# Patient Record
Sex: Male | Born: 1985
Health system: Southern US, Community
[De-identification: ages and names within clinical notes are randomized; demographics above are authoritative.]

## PROBLEM LIST (undated history)

## (undated) DIAGNOSIS — Z8669 Personal history of other diseases of the nervous system and sense organs: Secondary | ICD-10-CM

## (undated) DIAGNOSIS — F419 Anxiety disorder, unspecified: Secondary | ICD-10-CM

## (undated) DIAGNOSIS — G894 Chronic pain syndrome: Secondary | ICD-10-CM

## (undated) DIAGNOSIS — G2581 Restless legs syndrome: Secondary | ICD-10-CM

## (undated) DIAGNOSIS — F319 Bipolar disorder, unspecified: Secondary | ICD-10-CM

## (undated) DIAGNOSIS — Z87442 Personal history of urinary calculi: Secondary | ICD-10-CM

## (undated) DIAGNOSIS — F32A Depression, unspecified: Secondary | ICD-10-CM

## (undated) DIAGNOSIS — E785 Hyperlipidemia, unspecified: Secondary | ICD-10-CM

## (undated) DIAGNOSIS — J189 Pneumonia, unspecified organism: Secondary | ICD-10-CM

## (undated) DIAGNOSIS — F329 Major depressive disorder, single episode, unspecified: Secondary | ICD-10-CM

## (undated) HISTORY — DX: Hyperlipidemia, unspecified: E78.5

## (undated) HISTORY — DX: Restless legs syndrome: G25.81

## (undated) HISTORY — DX: Anxiety disorder, unspecified: F41.9

## (undated) HISTORY — PX: TESTICLE SURGERY: SHX794

## (undated) HISTORY — DX: Major depressive disorder, single episode, unspecified: F32.9

## (undated) HISTORY — DX: Depression, unspecified: F32.A

## (undated) HISTORY — PX: KNEE SURGERY: SHX244

---
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
## 2004-10-21 ENCOUNTER — Emergency Department: Payer: Self-pay | Admitting: Emergency Medicine

## 2004-10-23 ENCOUNTER — Emergency Department: Payer: Self-pay | Admitting: Emergency Medicine

## 2005-01-21 ENCOUNTER — Emergency Department: Payer: Self-pay | Admitting: Internal Medicine

## 2005-07-14 ENCOUNTER — Inpatient Hospital Stay: Payer: Self-pay | Admitting: Psychiatry

## 2005-11-13 ENCOUNTER — Emergency Department: Payer: Self-pay | Admitting: Internal Medicine

## 2005-11-30 ENCOUNTER — Emergency Department: Payer: Self-pay | Admitting: Emergency Medicine

## 2006-09-15 ENCOUNTER — Emergency Department: Payer: Self-pay | Admitting: General Practice

## 2007-07-04 ENCOUNTER — Emergency Department: Payer: Self-pay | Admitting: Emergency Medicine

## 2007-10-21 ENCOUNTER — Other Ambulatory Visit: Payer: Self-pay

## 2007-10-21 ENCOUNTER — Emergency Department: Payer: Self-pay | Admitting: Emergency Medicine

## 2009-09-19 ENCOUNTER — Emergency Department: Payer: Self-pay | Admitting: Emergency Medicine

## 2011-05-05 ENCOUNTER — Emergency Department: Payer: Self-pay | Admitting: Emergency Medicine

## 2011-05-18 ENCOUNTER — Ambulatory Visit: Payer: Self-pay | Admitting: Orthopedic Surgery

## 2013-12-10 ENCOUNTER — Emergency Department: Payer: Self-pay | Admitting: Emergency Medicine

## 2013-12-10 LAB — CBC WITH DIFFERENTIAL/PLATELET
Basophil #: 0 10*3/uL (ref 0.0–0.1)
Basophil %: 0.3 %
EOS PCT: 0.8 %
Eosinophil #: 0.1 10*3/uL (ref 0.0–0.7)
HCT: 45.9 % (ref 40.0–52.0)
HGB: 15.5 g/dL (ref 13.0–18.0)
LYMPHS ABS: 2.9 10*3/uL (ref 1.0–3.6)
Lymphocyte %: 24.4 %
MCH: 30 pg (ref 26.0–34.0)
MCHC: 33.8 g/dL (ref 32.0–36.0)
MCV: 89 fL (ref 80–100)
Monocyte #: 0.6 x10 3/mm (ref 0.2–1.0)
Monocyte %: 5.1 %
NEUTROS PCT: 69.4 %
Neutrophil #: 8.1 10*3/uL — ABNORMAL HIGH (ref 1.4–6.5)
Platelet: 193 10*3/uL (ref 150–440)
RBC: 5.17 10*6/uL (ref 4.40–5.90)
RDW: 13.1 % (ref 11.5–14.5)
WBC: 11.7 10*3/uL — AB (ref 3.8–10.6)

## 2013-12-10 LAB — URINALYSIS, COMPLETE
BILIRUBIN, UR: NEGATIVE
Bacteria: NONE SEEN
Blood: NEGATIVE
Glucose,UR: NEGATIVE mg/dL (ref 0–75)
Ketone: NEGATIVE
Leukocyte Esterase: NEGATIVE
Nitrite: NEGATIVE
PROTEIN: NEGATIVE
Ph: 7 (ref 4.5–8.0)
RBC,UR: NONE SEEN /HPF (ref 0–5)
Specific Gravity: 1.001 (ref 1.003–1.030)
Squamous Epithelial: NONE SEEN
WBC UR: NONE SEEN /HPF (ref 0–5)

## 2013-12-10 LAB — COMPREHENSIVE METABOLIC PANEL
ALT: 23 U/L
Albumin: 4.3 g/dL (ref 3.4–5.0)
Alkaline Phosphatase: 79 U/L
Anion Gap: 9 (ref 7–16)
BUN: 7 mg/dL (ref 7–18)
Bilirubin,Total: 0.3 mg/dL (ref 0.2–1.0)
CHLORIDE: 107 mmol/L (ref 98–107)
Calcium, Total: 8.8 mg/dL (ref 8.5–10.1)
Co2: 25 mmol/L (ref 21–32)
Creatinine: 0.74 mg/dL (ref 0.60–1.30)
EGFR (African American): >60
EGFR (Non-African Amer.): >60
GLUCOSE: 92 mg/dL (ref 65–99)
OSMOLALITY: 279 (ref 275–301)
Potassium: 3.5 mmol/L (ref 3.5–5.1)
SGOT(AST): 26 U/L (ref 15–37)
Sodium: 141 mmol/L (ref 136–145)
TOTAL PROTEIN: 7.2 g/dL (ref 6.4–8.2)

## 2013-12-10 LAB — LIPASE, BLOOD: Lipase: 73 U/L (ref 73–393)

## 2014-03-12 ENCOUNTER — Emergency Department: Payer: Self-pay | Admitting: Emergency Medicine

## 2014-03-12 LAB — CBC
HCT: 45.1 % (ref 40.0–52.0)
HGB: 15.1 g/dL (ref 13.0–18.0)
MCH: 30.2 pg (ref 26.0–34.0)
MCHC: 33.4 g/dL (ref 32.0–36.0)
MCV: 90 fL (ref 80–100)
Platelet: 226 10*3/uL (ref 150–440)
RBC: 4.99 10*6/uL (ref 4.40–5.90)
RDW: 13.3 % (ref 11.5–14.5)
WBC: 9.4 10*3/uL (ref 3.8–10.6)

## 2014-03-13 LAB — BASIC METABOLIC PANEL
ANION GAP: 7 (ref 7–16)
BUN: 2 mg/dL — ABNORMAL LOW (ref 7–18)
CALCIUM: 8.6 mg/dL (ref 8.5–10.1)
Chloride: 109 mmol/L — ABNORMAL HIGH (ref 98–107)
Co2: 25 mmol/L (ref 21–32)
Creatinine: 0.64 mg/dL (ref 0.60–1.30)
EGFR (African American): >60
EGFR (Non-African Amer.): >60
Glucose: 86 mg/dL (ref 65–99)
Osmolality: 277 (ref 275–301)
POTASSIUM: 3.5 mmol/L (ref 3.5–5.1)
Sodium: 141 mmol/L (ref 136–145)

## 2014-03-13 LAB — TROPONIN I

## 2014-07-15 ENCOUNTER — Emergency Department
Admit: 2014-07-15 | Disposition: A | Discharge status: Home / Self Care | Payer: Self-pay | Admitting: Emergency Medicine

## 2014-10-09 ENCOUNTER — Encounter: Payer: Self-pay | Admitting: Emergency Medicine

## 2014-10-09 DIAGNOSIS — Y9289 Other specified places as the place of occurrence of the external cause: Secondary | ICD-10-CM | POA: Insufficient documentation

## 2014-10-09 DIAGNOSIS — S8991XA Unspecified injury of right lower leg, initial encounter: Secondary | ICD-10-CM | POA: Diagnosis not present

## 2014-10-09 DIAGNOSIS — Y9389 Activity, other specified: Secondary | ICD-10-CM | POA: Insufficient documentation

## 2014-10-09 DIAGNOSIS — X58XXXA Exposure to other specified factors, initial encounter: Secondary | ICD-10-CM | POA: Diagnosis not present

## 2014-10-09 DIAGNOSIS — Y990 Civilian activity done for income or pay: Secondary | ICD-10-CM | POA: Insufficient documentation

## 2014-10-09 MED ORDER — KETOROLAC TROMETHAMINE 10 MG PO TABS
10.0000 mg | ORAL_TABLET | Freq: Once | ORAL | Status: AC
  Administered 2014-10-09: 10 mg via ORAL
  Filled 2014-10-09: qty 6

## 2014-10-09 MED ORDER — OXYCODONE-ACETAMINOPHEN 5-325 MG PO TABS
1.0000 | ORAL_TABLET | Freq: Once | ORAL | Status: AC
  Administered 2014-10-09: 1 via ORAL
  Filled 2014-10-09: qty 1

## 2014-10-09 MED ORDER — KETOROLAC TROMETHAMINE 10 MG PO TABS: 10.0000 mg | ORAL_TABLET | Freq: Three times a day (TID) | ORAL | Status: DC | PRN

## 2014-10-09 NOTE — ED Provider Notes (Signed)
[facility] Emergency Department Provider Note  ____________________________________________  Time seen: 11:35 PM  I have reviewed the triage vital signs and the nursing notes.   HISTORY  Chief Complaint Knee Pain      HPI Joel Carr is a 29 y.o. male presents with "my right knee just gave out". Patient states while at work tonight he had acute onset of right knee pain is currently 7 out of 10 and that his knee just gave out. Patient denies any injury tonight states that he's had a previous injury to the right knee for which he is followed by Dr. Rosita KeaMenz. She has an appointment Dr. Rosita KeaMenz on Monday     Past Medical History  Diagnosis Date  . Bipolar disorder     There are no active problems to display for this patient.   History reviewed. No pertinent past surgical history.  No current outpatient prescriptions on file.  Allergies Review of patient's allergies indicates no known allergies.  No family history on file.  Social History History  Substance Use Topics  . Smoking status: Never Smoker   . Smokeless tobacco: Never Used  . Alcohol Use: Yes    Review of Systems  Constitutional: Negative for fever. Eyes: Negative for visual changes. ENT: Negative for sore throat. Cardiovascular: Negative for chest pain. Respiratory: Negative for shortness of breath. Gastrointestinal: Negative for abdominal pain, vomiting and diarrhea. Genitourinary: Negative for dysuria. Musculoskeletal: Negative for back pain.Positive for right knee pain  Skin: Negative for rash. Neurological: Negative for headaches, focal weakness or numbness.   10-point ROS otherwise negative.  ____________________________________________   PHYSICAL EXAM:  VITAL SIGNS: ED Triage Vitals  Enc Vitals Group     BP 10/09/14 2057 124/77 mmHg     Pulse Rate 10/09/14 2057 72     Resp 10/09/14 2057 18     Temp 10/09/14 2057 98.5 F (36.9 C)     Temp Source 10/09/14 2057  Oral     SpO2 10/09/14 2057 99 %     Weight 10/09/14 2057 160 lb (72.576 kg)     Height 10/09/14 2057 5\' 8"  (1.727 m)     Head Cir --      Peak Flow --      Pain Score 10/09/14 2058 9     Pain Loc --      Pain Edu? --      Excl. in GC? --      Constitutional: Alert and oriented. Well appearing and in no distress. Musculoskeletal: Nontender with normal range of motion in all extremities. No joint effusions.  pain with palpation of the anterior right knee pain with anterior and posterior draw tests  Neurologic:  Normal speech and language. No gross focal neurologic deficits are appreciated. Speech is normal.  Skin:  Skin is warm, dry and intact. No rash noted. Psychiatric: Mood and affect are normal. Speech and behavior are normal. Patient exhibits appropriate insight and judgment.     INITIAL IMPRESSION / ASSESSMENT AND PLAN / ED COURSE  Pertinent labs & imaging results that were available during my care of the patient were reviewed by me and considered in my medical decision making (see chart for details).  History of physical exam consistent with possible ligamentous injury of the right knee. In absence of trauma x-ray of the knee was not performed. Patient will be referred to Dr. Trilby DrummerManns for possible outpatient MRI.  ____________________________________________   FINAL CLINICAL IMPRESSION(S) / ED DIAGNOSES  Final diagnoses:  Knee pain, right anterior      Darci Current, MD 10/11/14 [phone]

## 2014-10-09 NOTE — ED Notes (Signed)
Pt presents to ED with right knee pain. Pt states he has a previous injury which is being followed by Dr. Rosita KeaMenz since April due to a fall at work. Pt states tonight while at work pt was walking and he felt his right knee "give out" and has been hurting ever since then. Pt denies any new injury or fall. Limping in triage. No obvious deformity.

## 2014-10-09 NOTE — Discharge Instructions (Signed)

## 2014-10-10 ENCOUNTER — Emergency Department
Admission: EM | Admit: 2014-10-10 | Discharge: 2014-10-10 | Disposition: A | Discharge status: Home / Self Care | Payer: BLUE CROSS/BLUE SHIELD | Attending: Emergency Medicine | Admitting: Emergency Medicine

## 2014-10-10 DIAGNOSIS — M25561 Pain in right knee: Secondary | ICD-10-CM

## 2014-10-10 HISTORY — DX: Bipolar disorder, unspecified: F31.9

## 2014-10-10 NOTE — ED Notes (Signed)
Patient reporting that he has crutches and knee immobilizer at home. MD advised patient to utilize devices until f/u with Rosita KeaMenz, MD

## 2014-10-10 NOTE — ED Notes (Signed)

## 2014-10-29 ENCOUNTER — Ambulatory Visit
Admission: RE | Admit: 2014-10-29 | Discharge: 2014-10-29 | Disposition: A | Discharge status: Home / Self Care | Payer: BLUE CROSS/BLUE SHIELD | Source: Ambulatory Visit | Admitting: Orthopedic Surgery

## 2014-10-29 ENCOUNTER — Ambulatory Visit: Payer: BLUE CROSS/BLUE SHIELD | Admitting: Certified Registered Nurse Anesthetist

## 2014-10-29 ENCOUNTER — Encounter
Admission: RE | Disposition: A | Discharge status: Home / Self Care | Payer: Self-pay | Source: Ambulatory Visit | Attending: Orthopedic Surgery

## 2014-10-29 ENCOUNTER — Encounter: Payer: Self-pay | Admitting: *Deleted

## 2014-10-29 ENCOUNTER — Ambulatory Visit
Admission: RE | Admit: 2014-10-29 | Discharge: 2014-10-29 | Disposition: A | Discharge status: Home / Self Care | Payer: BLUE CROSS/BLUE SHIELD | Source: Ambulatory Visit | Attending: Orthopedic Surgery | Admitting: Orthopedic Surgery

## 2014-10-29 DIAGNOSIS — Z79899 Other long term (current) drug therapy: Secondary | ICD-10-CM | POA: Insufficient documentation

## 2014-10-29 DIAGNOSIS — F319 Bipolar disorder, unspecified: Secondary | ICD-10-CM | POA: Insufficient documentation

## 2014-10-29 DIAGNOSIS — M25861 Other specified joint disorders, right knee: Secondary | ICD-10-CM | POA: Diagnosis not present

## 2014-10-29 HISTORY — PX: KNEE ARTHROSCOPY WITH MENISCAL REPAIR: SHX5653

## 2014-10-29 SURGERY — ARTHROSCOPY, KNEE, WITH MENISCUS REPAIR
Anesthesia: General | Site: Knee | Laterality: Right | Wound class: Clean

## 2014-10-29 MED ORDER — LACTATED RINGERS IV SOLN
INTRAVENOUS | Status: DC
  Administered 2014-10-29: 100 mL/h via INTRAVENOUS

## 2014-10-29 MED ORDER — MIDAZOLAM HCL 2 MG/2ML IJ SOLN
INTRAMUSCULAR | Status: DC | PRN
  Administered 2014-10-29: 2 mg via INTRAVENOUS

## 2014-10-29 MED ORDER — LIDOCAINE HCL (CARDIAC) 20 MG/ML IV SOLN
INTRAVENOUS | Status: DC | PRN
  Administered 2014-10-29: 100 mg via INTRAVENOUS

## 2014-10-29 MED ORDER — ONDANSETRON HCL 4 MG/2ML IJ SOLN: 4.0000 mg | Freq: Once | INTRAMUSCULAR | Status: DC | PRN

## 2014-10-29 MED ORDER — FENTANYL CITRATE (PF) 100 MCG/2ML IJ SOLN: 25.0000 ug | INTRAMUSCULAR | Status: DC | PRN

## 2014-10-29 MED ORDER — FENTANYL CITRATE (PF) 100 MCG/2ML IJ SOLN
INTRAMUSCULAR | Status: DC | PRN
  Administered 2014-10-29 (×4): 25 ug via INTRAVENOUS

## 2014-10-29 MED ORDER — PROPOFOL 10 MG/ML IV BOLUS
INTRAVENOUS | Status: DC | PRN
  Administered 2014-10-29: 200 mg via INTRAVENOUS

## 2014-10-29 MED ORDER — BUPIVACAINE-EPINEPHRINE (PF) 0.5% -1:200000 IJ SOLN
INTRAMUSCULAR | Status: AC
  Filled 2014-10-29: qty 30

## 2014-10-29 MED ORDER — ACETAMINOPHEN 10 MG/ML IV SOLN
INTRAVENOUS | Status: AC
  Filled 2014-10-29: qty 100

## 2014-10-29 MED ORDER — ONDANSETRON HCL 4 MG/2ML IJ SOLN
INTRAMUSCULAR | Status: DC | PRN
  Administered 2014-10-29: 4 mg via INTRAVENOUS

## 2014-10-29 MED ORDER — BUPIVACAINE-EPINEPHRINE (PF) 0.5% -1:200000 IJ SOLN
INTRAMUSCULAR | Status: DC | PRN
  Administered 2014-10-29: 30 mL via PERINEURAL

## 2014-10-29 MED ORDER — HYDROCODONE-ACETAMINOPHEN 5-325 MG PO TABS: 1.0000 | ORAL_TABLET | Freq: Four times a day (QID) | ORAL | Status: DC | PRN

## 2014-10-29 MED ORDER — ACETAMINOPHEN 10 MG/ML IV SOLN
INTRAVENOUS | Status: DC | PRN
  Administered 2014-10-29: 1000 mg via INTRAVENOUS

## 2014-10-29 SURGICAL SUPPLY — 28 items
BANDAGE ELASTIC 4 CLIP NS LF (GAUZE/BANDAGES/DRESSINGS) ×2 IMPLANT
BANDAGE ELASTIC 4 CLIP ST LF (GAUZE/BANDAGES/DRESSINGS) ×2 IMPLANT
BLADE FULL RADIUS 3.5 (BLADE) IMPLANT
BLADE INCISOR PLUS 4.5 (BLADE) ×2 IMPLANT
BLADE SHAVER 4.5 DBL SERAT CV (CUTTER) IMPLANT
BLADE SHAVER 4.5X7 STR FR (MISCELLANEOUS) ×2 IMPLANT
CHLORAPREP W/TINT 26ML (MISCELLANEOUS) ×2 IMPLANT
CUTTER AGGRESSIVE+ 3.5 (CUTTER) IMPLANT
GAUZE PETRO XEROFOAM 1X8 (MISCELLANEOUS) ×2 IMPLANT
GAUZE SPONGE 4X4 12PLY STRL (GAUZE/BANDAGES/DRESSINGS) ×2 IMPLANT
GLOVE BIOGEL PI IND STRL 9 (GLOVE) ×2 IMPLANT
GLOVE BIOGEL PI INDICATOR 9 (GLOVE) ×2
GLOVE SURG ORTHO 9.0 STRL STRW (GLOVE) ×4 IMPLANT
GOWN SPECIALTY ULTRA XL (MISCELLANEOUS) ×2 IMPLANT
GOWN STRL REUS W/ TWL LRG LVL3 (GOWN DISPOSABLE) ×2 IMPLANT
GOWN STRL REUS W/TWL LRG LVL3 (GOWN DISPOSABLE) ×2
IV LACTATED RINGER IRRG 3000ML (IV SOLUTION) ×2
IV LR IRRIG 3000ML ARTHROMATIC (IV SOLUTION) ×2 IMPLANT
KIT RM TURNOVER STRD PROC AR (KITS) ×2 IMPLANT
MANIFOLD NEPTUNE II (INSTRUMENTS) ×2 IMPLANT
PACK ARTHROSCOPY KNEE (MISCELLANEOUS) ×2 IMPLANT
SET TUBE SUCT SHAVER OUTFL 24K (TUBING) ×2 IMPLANT
SET TUBE TIP INTRA-ARTICULAR (MISCELLANEOUS) ×2 IMPLANT
SUT ETHILON 4-0 (SUTURE) ×1
SUT ETHILON 4-0 FS2 18XMFL BLK (SUTURE) ×1
SUTURE ETHLN 4-0 FS2 18XMF BLK (SUTURE) ×1 IMPLANT
TUBING ARTHRO INFLOW-ONLY STRL (TUBING) ×2 IMPLANT
WAND HAND CNTRL MULTIVAC 50 (MISCELLANEOUS) ×2 IMPLANT

## 2014-10-29 NOTE — Transfer of Care (Signed)
Immediate Anesthesia Transfer of Care Note  Patient: Joel Carr  Procedure(s) Performed: Procedure(s): KNEE ARTHROSCOPY WITH MENISCAL REPAIR (Right)  Patient Location: PACU  Anesthesia Type:General  Level of Consciousness: sedated  Airway & Oxygen Therapy: Patient Spontanous Breathing and Patient connected to face mask oxygen  Post-op Assessment: Report given to RN and Post -op Vital signs reviewed and stable  Post vital signs: Reviewed and stable  Last Vitals:  Filed Vitals:   10/29/14 1358  BP: 120/75  Pulse: 73  Temp: 36.7 C  Resp: 13    Complications: No apparent anesthesia complications

## 2014-10-29 NOTE — Op Note (Signed)
10/29/2014  2:04 PM  PATIENT:  Joel Carr  29 y.o. male  PRE-OPERATIVE DIAGNOSIS:  impingement syndrome involving patellar fat pad  POST-OPERATIVE DIAGNOSIS:  lateral release and partial resection of fat pad, patella subluxation  PROCEDURE:  Procedure(s): KNEE ARTHROSCOPY WITH FAT PAD PARTIAL EXCISION AND ARTHROSCOPIC LATERAL RELEASE  SURGEON: Leitha Schuller, MD  ASSISTANTS: None  ANESTHESIA:   general  EBL:  Total I/O In: 600 [I.V.:600] Out: 10 [Blood:10]  BLOOD ADMINISTERED:none  DRAINS: none   LOCAL MEDICATIONS USED:  MARCAINE     SPECIMEN:  No Specimen  DISPOSITION OF SPECIMEN:  N/A  COUNTS:  YES  TOURNIQUET:   none  IMPLANTS: None  DICTATION: .Dragon Dictation patient brought the operating room and after adequate general anesthesia was obtained the right leg was prepped and draped in sterile fashion tourniquet applied but not required ARTHROSCOPIC leg holder. After prepping draping the sterile fashion appropriate patient identification and timeout procedures were completed. An inferior lateral portal was made and arthroscope introduced initial inspection revealed lateral subluxation of patella with a tight lateral retinaculum gutters were free of any loose bodies, the inferior medial portal was made and on probing the meniscus medially and laterally were intact anterior cruciate ligament intact articular cartilage near normal with just a few small areas of superficial layer loss of a few millimeters. The fat pad did impinge at the patellofemoral joint in flexion and extension initiated residual used to debride this to remove the impinging tissue and release the fat pad more anterior to the menisci next the lateral releases carried out with ArthroCare wand of the patella tracked more in the midline. After completing this the knee was thoroughly irrigated and instrumentation withdrawn. 30 cc quarter percent Sensorcaine with epinephrine were infiltrated in the particular  tissue and incisions patient was then sent to recovery in stable condition after wounds were closed with simple interrupted 4-0 nylon Xeroform 4 x 4 web roll and Ace wrap applied  PLAN OF CARE: Discharge to home after PACU  PATIENT DISPOSITION:  PACU - hemodynamically stable.

## 2014-10-29 NOTE — Discharge Instructions (Addendum)
Work on gentle range of motion of knee. Keep bandage clean and dry until return visit take 81 or 325 mg aspirin a day for 1 month   AMBULATORY SURGERY  DISCHARGE INSTRUCTIONS   1) The drugs that you were given will stay in your system until tomorrow so for the next 24 hours you should not:  A) Drive an automobile B) Make any legal decisions C) Drink any alcoholic beverage   2) You may resume regular meals tomorrow.  Today it is better to start with liquids and gradually work up to solid foods.  You may eat anything you prefer, but it is better to start with liquids, then soup and crackers, and gradually work up to solid foods.   3) Please notify your doctor immediately if you have any unusual bleeding, trouble breathing, redness and pain at the surgery site, drainage, fever, or pain not relieved by medication.    4) Additional Instructions:    Please contact your physician with any problems or Same Day Surgery at [phone], Monday through Friday 6 am to 4 pm, or [place] [facility] number at [phone].

## 2014-10-29 NOTE — Anesthesia Postprocedure Evaluation (Signed)
  Anesthesia Post-op Note  Patient: Joel Carr  Procedure(s) Performed: Procedure(s): KNEE ARTHROSCOPY WITH MENISCAL REPAIR (Right)  Anesthesia type:General  Patient location: PACU  Post pain: Pain level controlled  Post assessment: Post-op Vital signs reviewed, Patient's Cardiovascular Status Stable, Respiratory Function Stable, Patent Airway and No signs of Nausea or vomiting  Post vital signs: Reviewed and stable  Last Vitals:  Filed Vitals:   10/29/14 1427  BP: 117/83  Pulse: 69  Temp:   Resp: 14    Level of consciousness: awake, alert  and patient cooperative  Complications: No apparent anesthesia complications

## 2014-10-29 NOTE — Anesthesia Preprocedure Evaluation (Addendum)
Anesthesia Evaluation  Patient identified by MRN, date of birth, ID band Patient awake    Reviewed: Allergy & Precautions, NPO status , Patient's Chart, lab work & pertinent test results  Airway Mallampati: II  TM Distance: >3 FB Neck ROM: Full    Dental  (+) Poor Dentition, Chipped   Pulmonary Current Smoker,  breath sounds clear to auscultation  Pulmonary exam normal       Cardiovascular negative cardio ROS Normal cardiovascular exam    Neuro/Psych Bipolar Disorder    GI/Hepatic negative GI ROS, Neg liver ROS,   Endo/Other  negative endocrine ROS  Renal/GU negative Renal ROS  negative genitourinary   Musculoskeletal negative musculoskeletal ROS (+)   Abdominal Normal abdominal exam  (+)   Peds negative pediatric ROS (+)  Hematology negative hematology ROS (+)   Anesthesia Other Findings   Reproductive/Obstetrics                            Anesthesia Physical Anesthesia Plan  ASA: II  Anesthesia Plan: General   Post-op Pain Management:    Induction: Intravenous  Airway Management Planned: LMA  Additional Equipment:   Intra-op Plan:   Post-operative Plan: Extubation in OR  Informed Consent: I have reviewed the patients History and Physical, chart, labs and discussed the procedure including the risks, benefits and alternatives for the proposed anesthesia with the patient or authorized representative who has indicated his/her understanding and acceptance.   Dental advisory given  Plan Discussed with: CRNA and Surgeon  Anesthesia Plan Comments:         Anesthesia Quick Evaluation

## 2014-10-29 NOTE — H&P (Signed)
Reviewed paper H+P, will be scanned into chart. No changes noted.  

## 2014-12-30 ENCOUNTER — Ambulatory Visit: Payer: Self-pay | Admitting: Family Medicine

## 2015-01-01 ENCOUNTER — Encounter: Payer: Self-pay | Admitting: Family Medicine

## 2015-01-01 ENCOUNTER — Ambulatory Visit (INDEPENDENT_AMBULATORY_CARE_PROVIDER_SITE_OTHER): Payer: BLUE CROSS/BLUE SHIELD | Admitting: Family Medicine

## 2015-01-01 VITALS — BP 98/62 | HR 71 | Temp 97.8°F | Ht 67.7 in | Wt 167.0 lb

## 2015-01-01 DIAGNOSIS — F3131 Bipolar disorder, current episode depressed, mild: Secondary | ICD-10-CM

## 2015-01-01 DIAGNOSIS — F319 Bipolar disorder, unspecified: Secondary | ICD-10-CM | POA: Insufficient documentation

## 2015-01-01 MED ORDER — ARIPIPRAZOLE 15 MG PO TABS: 15.0000 mg | ORAL_TABLET | Freq: Every day | ORAL | Status: DC

## 2015-01-01 MED ORDER — CLONAZEPAM 1 MG PO TABS: 1.0000 mg | ORAL_TABLET | Freq: Every day | ORAL | Status: DC

## 2015-01-01 NOTE — Progress Notes (Signed)
   BP 98/62 mmHg  Pulse 71  Temp(Src) 97.8 F (36.6 C)  Ht 5' 7.7" (1.72 m)  Wt 167 lb (75.751 kg)  BMI 25.61 kg/m2  SpO2 98%   Subjective:    Patient ID: Joel Carr, male    DOB: 12/26/1985, 29 y.o.   MRN: [id number]  HPI: Joel Carr is a 29 y.o. male  Chief Complaint  Patient presents with  . Depression   patient with continued anxiety nerves not helped really by clonazepam. His taken Abilify 10 and will make total 200 without problems. Has a lot of anxiety heart time sleeping at night. Works going okay and is continuing to get a raise. Has recovered well from the surgery and is off pain medications.  Relevant past medical, surgical, family and social history reviewed and updated as indicated. Interim medical history since our last visit reviewed. Allergies and medications reviewed and updated.  Review of Systems  Per HPI unless specifically indicated above     Objective:    BP 98/62 mmHg  Pulse 71  Temp(Src) 97.8 F (36.6 C)  Ht 5' 7.7" (1.72 m)  Wt 167 lb (75.751 kg)  BMI 25.61 kg/m2  SpO2 98%  Wt Readings from Last 3 Encounters:  01/01/15 167 lb (75.751 kg)  07/23/14 164 lb (74.39 kg)  10/29/14 160 lb (72.576 kg)    Physical Exam      Assessment & Plan:   Problem List Items Addressed This Visit      Other   Bipolar disorder (HCC) - Primary    Discussed care and treatment with medications will not adjust clonazepam Will increase Abilify to 15 Discuss psychiatry referral to optimize medications gave patient phone numbers and encouraged call today Discuss cautions at work          Follow up plan: Return in about 2 months (around 03/03/2015), or if symptoms worsen or fail to improve, for Recheck medications and referrals.

## 2015-01-01 NOTE — Assessment & Plan Note (Signed)
Discussed care and treatment with medications will not adjust clonazepam Will increase Abilify to 15 Discuss psychiatry referral to optimize medications gave patient phone numbers and encouraged call today Discuss cautions at work

## 2015-01-16 ENCOUNTER — Other Ambulatory Visit: Payer: Self-pay | Admitting: Family Medicine

## 2015-03-06 ENCOUNTER — Ambulatory Visit (INDEPENDENT_AMBULATORY_CARE_PROVIDER_SITE_OTHER): Payer: BLUE CROSS/BLUE SHIELD | Admitting: Family Medicine

## 2015-03-06 ENCOUNTER — Encounter: Payer: Self-pay | Admitting: Family Medicine

## 2015-03-06 VITALS — BP 100/66 | HR 74 | Temp 97.8°F | Ht 67.7 in | Wt 169.0 lb

## 2015-03-06 DIAGNOSIS — F3131 Bipolar disorder, current episode depressed, mild: Secondary | ICD-10-CM | POA: Diagnosis not present

## 2015-03-06 NOTE — Progress Notes (Signed)
   BP 100/66 mmHg  Pulse 74  Temp(Src) 97.8 F (36.6 C)  Ht 5' 7.7" (1.72 m)  Wt 169 lb (76.658 kg)  BMI 25.91 kg/m2  SpO2 96%   Subjective:    Patient ID: Joel Carr, male    DOB: 05/09/1985, 29 y.o.   MRN: [id number]  HPI: Joel Carr is a 29 y.o. male  Chief Complaint  Patient presents with  . Depression   patient doing well with medications followed by Dr. Maryruth BunKapur Sleeping well having good energy nerves are much better Job with with work is doing well as a Merchandiser, retailsupervisor.   Relevant past medical, surgical, family and social history reviewed and updated as indicated. Interim medical history since our last visit reviewed. Allergies and medications reviewed and updated.  Review of Systems  Per HPI unless specifically indicated above     Objective:    BP 100/66 mmHg  Pulse 74  Temp(Src) 97.8 F (36.6 C)  Ht 5' 7.7" (1.72 m)  Wt 169 lb (76.658 kg)  BMI 25.91 kg/m2  SpO2 96%  Wt Readings from Last 3 Encounters:  03/06/15 169 lb (76.658 kg)  01/01/15 167 lb (75.751 kg)  07/23/14 164 lb (74.39 kg)    Physical Exam  Constitutional: He is oriented to person, place, and time. He appears well-developed and well-nourished. No distress.  HENT:  Head: Normocephalic and atraumatic.  Right Ear: Hearing normal.  Left Ear: Hearing normal.  Nose: Nose normal.  Eyes: Conjunctivae and lids are normal. Right eye exhibits no discharge. Left eye exhibits no discharge. No scleral icterus.  Cardiovascular: Normal rate, regular rhythm and normal heart sounds.   Pulmonary/Chest: Effort normal and breath sounds normal. No respiratory distress.  Musculoskeletal: Normal range of motion.  Neurological: He is alert and oriented to person, place, and time.  Skin: Skin is intact. No rash noted.  Psychiatric: He has a normal mood and affect. His speech is normal and behavior is normal. Judgment and thought content normal. Cognition and memory are normal.        Assessment & Plan:    Problem List Items Addressed This Visit      Other   Bipolar disorder (HCC) - Primary    The current medical regimen is effective;  continue present plan and medications.           Follow up plan: Return for Physical Exam early spring.

## 2015-03-06 NOTE — Assessment & Plan Note (Signed)
The current medical regimen is effective;  continue present plan and medications.  

## 2015-04-06 ENCOUNTER — Encounter: Payer: Self-pay | Admitting: Family Medicine

## 2015-04-08 ENCOUNTER — Encounter: Payer: Self-pay | Admitting: Family Medicine

## 2015-04-10 ENCOUNTER — Telehealth: Payer: Self-pay | Admitting: Family Medicine

## 2015-04-10 DIAGNOSIS — E785 Hyperlipidemia, unspecified: Secondary | ICD-10-CM

## 2015-04-10 DIAGNOSIS — E782 Mixed hyperlipidemia: Secondary | ICD-10-CM | POA: Insufficient documentation

## 2015-04-10 NOTE — Telephone Encounter (Signed)
Called to discuss lab results from Dr. Shelda AltesKapur's office. Cholesterol high. Will work on diet and exercise and check in 6 months. Information mailed to patient regarding diet and exercise.

## 2015-06-04 ENCOUNTER — Encounter: Payer: Self-pay | Admitting: Family Medicine

## 2015-06-04 ENCOUNTER — Encounter: Payer: BLUE CROSS/BLUE SHIELD | Admitting: Family Medicine

## 2015-09-02 ENCOUNTER — Encounter: Payer: Self-pay | Admitting: Family Medicine

## 2015-09-02 ENCOUNTER — Ambulatory Visit (INDEPENDENT_AMBULATORY_CARE_PROVIDER_SITE_OTHER): Payer: BLUE CROSS/BLUE SHIELD | Admitting: Family Medicine

## 2015-09-02 VITALS — BP 108/69 | HR 71 | Temp 97.8°F | Ht 66.5 in | Wt 160.0 lb

## 2015-09-02 DIAGNOSIS — F3131 Bipolar disorder, current episode depressed, mild: Secondary | ICD-10-CM | POA: Diagnosis not present

## 2015-09-02 DIAGNOSIS — Z0000 Encounter for general adult medical examination without abnormal findings: Secondary | ICD-10-CM | POA: Diagnosis not present

## 2015-09-02 DIAGNOSIS — E785 Hyperlipidemia, unspecified: Secondary | ICD-10-CM | POA: Diagnosis not present

## 2015-09-02 LAB — URINALYSIS, ROUTINE W REFLEX MICROSCOPIC
Bilirubin, UA: NEGATIVE
GLUCOSE, UA: NEGATIVE
KETONES UA: NEGATIVE
LEUKOCYTES UA: NEGATIVE
Nitrite, UA: NEGATIVE
PROTEIN UA: NEGATIVE
RBC UA: NEGATIVE
Specific Gravity, UA: <1.005 — ABNORMAL LOW (ref 1.005–1.030)
Urobilinogen, Ur: 0.2 mg/dL (ref 0.2–1.0)
pH, UA: 7 (ref 5.0–7.5)

## 2015-09-02 NOTE — Progress Notes (Signed)
BP 108/69 mmHg  Pulse 71  Temp(Src) 97.8 F (36.6 C)  Ht 5' 6.5" (1.689 m)  Wt 160 lb (72.576 kg)  BMI 25.44 kg/m2  SpO2 96%   Subjective:    Patient ID: Joel Carr, male    DOB: [birth year], 30 y.o.   MRN: [id number]  HPI: Joel Carr is a 30 y.o. male  Chief Complaint  Patient presents with  . Annual Exam   Patient for physical doing well no complaints working with Dr. Nicolasa Ducking and nerves are doing the best they've done in some time. No side effects from medications except for taste. Weight continues to come down nicely on change in medications. Relevant past medical, surgical, family and social history reviewed and updated as indicated. Interim medical history since our last visit reviewed. Allergies and medications reviewed and updated.  Review of Systems  Constitutional: Negative.   HENT: Negative.   Eyes: Negative.   Respiratory: Negative.   Cardiovascular: Negative.   Gastrointestinal: Negative.   Endocrine: Negative.   Genitourinary: Negative.   Musculoskeletal: Negative.   Skin: Negative.   Allergic/Immunologic: Negative.   Neurological: Negative.   Hematological: Negative.   Psychiatric/Behavioral: Negative.     Per HPI unless specifically indicated above     Objective:    BP 108/69 mmHg  Pulse 71  Temp(Src) 97.8 F (36.6 C)  Ht 5' 6.5" (1.689 m)  Wt 160 lb (72.576 kg)  BMI 25.44 kg/m2  SpO2 96%  Wt Readings from Last 3 Encounters:  09/02/15 160 lb (72.576 kg)  03/06/15 169 lb (76.658 kg)  01/01/15 167 lb (75.751 kg)    Physical Exam  Constitutional: He is oriented to person, place, and time. He appears well-developed and well-nourished.  HENT:  Head: Normocephalic.  Right Ear: External ear normal.  Left Ear: External ear normal.  Nose: Nose normal.  Eyes: Conjunctivae and EOM are normal. Pupils are equal, round, and reactive to light.  Neck: Normal range of motion. Neck supple. No thyromegaly present.  Cardiovascular: Normal rate,  regular rhythm, normal heart sounds and intact distal pulses.   Pulmonary/Chest: Effort normal and breath sounds normal.  Abdominal: Soft. Bowel sounds are normal. There is no splenomegaly or hepatomegaly.  Genitourinary: Penis normal.  Musculoskeletal: Normal range of motion.  Lymphadenopathy:    He has no cervical adenopathy.  Neurological: He is alert and oriented to person, place, and time. He has normal reflexes.  Skin: Skin is warm and dry.  Psychiatric: He has a normal mood and affect. His behavior is normal. Judgment and thought content normal.    Results for orders placed or performed in visit on 03/12/14  CBC  Result Value Ref Range   WBC 9.4 3.8-10.6 x10 3/mm 3   RBC 4.99 4.40-5.90 x10 6/mm 3   HGB 15.1 13.0-18.0 g/dL   HCT 45.1 40.0-52.0 %   MCV 90 80-100 fL   MCH 30.2 26.0-34.0 pg   MCHC 33.4 32.0-36.0 g/dL   RDW 13.3 11.5-14.5 %   Platelet 226 150-440 x10 3/mm 3  Troponin I  Result Value Ref Range   Troponin-I < 0.02 ng/mL  Basic metabolic panel  Result Value Ref Range   Glucose 86 65-99 mg/dL   BUN 2 (L) 7-18 mg/dL   Creatinine 0.64 0.60-1.30 mg/dL   Sodium 141 136-145 mmol/L   Potassium 3.5 3.5-5.1 mmol/L   Chloride 109 (H) 98-107 mmol/L   Co2 25 21-32 mmol/L   Calcium, Total 8.6 8.5-10.1 mg/dL   Osmolality 277  275-301   Anion Gap 7 7-16   EGFR (African American) >60 >50m/min   EGFR (Non-African Amer.) >60 >635mmin      Assessment & Plan:   Problem List Items Addressed This Visit      Other   [place]   The current medical regimen is effective;  continue present plan and medications.       Hyperlipidemia    Other Visit Diagnoses    Routine general medical examination at a health care facility    -  Primary    Relevant Orders    CBC with Differential/Platelet    Lipid Panel w/o Chol/HDL Ratio    Comprehensive metabolic panel    TSH    [facility]inal[facility], [facility] w reflex microscopic [facility]   HIV antibody        Follow  up plan: Return in about 1 year (around 09/01/2016), or if symptoms worsen or fail to improve, for Physical Exam.

## 2015-09-02 NOTE — Addendum Note (Signed)
Addended by: Bennetta LaosWILSON, NANCY H on: 09/02/2015 01:27 PM   Modules accepted: Kipp BroodSmartSet

## 2015-09-02 NOTE — Assessment & Plan Note (Signed)
The current medical regimen is effective;  continue present plan and medications.  

## 2015-09-03 ENCOUNTER — Encounter: Payer: Self-pay | Admitting: Family Medicine

## 2015-09-03 LAB — HIV ANTIBODY (ROUTINE TESTING W REFLEX): HIV Screen 4th Generation wRfx: NONREACTIVE

## 2015-09-03 LAB — CBC WITH DIFFERENTIAL/PLATELET
BASOS ABS: 0 10*3/uL (ref 0.0–0.2)
Basos: 0 %
EOS (ABSOLUTE): 0.1 10*3/uL (ref 0.0–0.4)
Eos: 1 %
HEMOGLOBIN: 15.5 g/dL (ref 12.6–17.7)
Hematocrit: 44.6 % (ref 37.5–51.0)
Immature Grans (Abs): 0 10*3/uL (ref 0.0–0.1)
Immature Granulocytes: 0 %
LYMPHS ABS: 2.1 10*3/uL (ref 0.7–3.1)
Lymphs: 27 %
MCH: 30.6 pg (ref 26.6–33.0)
MCHC: 34.8 g/dL (ref 31.5–35.7)
MCV: 88 fL (ref 79–97)
MONOCYTES: 7 %
Monocytes Absolute: 0.5 10*3/uL (ref 0.1–0.9)
NEUTROS ABS: 5.2 10*3/uL (ref 1.4–7.0)
Neutrophils: 65 %
Platelets: 229 10*3/uL (ref 150–379)
RBC: 5.07 x10E6/uL (ref 4.14–5.80)
RDW: 13.5 % (ref 12.3–15.4)
WBC: 7.9 10*3/uL (ref 3.4–10.8)

## 2015-09-03 LAB — COMPREHENSIVE METABOLIC PANEL
ALBUMIN: 4.9 g/dL (ref 3.5–5.5)
ALT: 16 IU/L (ref 0–44)
AST: 19 IU/L (ref 0–40)
Albumin/Globulin Ratio: 2.9 — ABNORMAL HIGH (ref 1.2–2.2)
Alkaline Phosphatase: 76 IU/L (ref 39–117)
BUN / CREAT RATIO: 6 — AB (ref 9–20)
BUN: 5 mg/dL — AB (ref 6–20)
Bilirubin Total: 0.3 mg/dL (ref 0.0–1.2)
CALCIUM: 9.7 mg/dL (ref 8.7–10.2)
CO2: 24 mmol/L (ref 18–29)
Chloride: 102 mmol/L (ref 96–106)
Creatinine, Ser: 0.77 mg/dL (ref 0.76–1.27)
GFR calc non Af Amer: 121 mL/min/{1.73_m2} (ref >59)
GFR, EST AFRICAN AMERICAN: 140 mL/min/{1.73_m2} (ref >59)
GLUCOSE: 74 mg/dL (ref 65–99)
Globulin, Total: 1.7 g/dL (ref 1.5–4.5)
Potassium: 4.3 mmol/L (ref 3.5–5.2)
Sodium: 144 mmol/L (ref 134–144)
TOTAL PROTEIN: 6.6 g/dL (ref 6.0–8.5)

## 2015-09-03 LAB — LIPID PANEL W/O CHOL/HDL RATIO
Cholesterol, Total: 201 mg/dL — ABNORMAL HIGH (ref 100–199)
HDL: 36 mg/dL — ABNORMAL LOW (ref >39)
LDL CALC: 116 mg/dL — AB (ref 0–99)
Triglycerides: 244 mg/dL — ABNORMAL HIGH (ref 0–149)
VLDL Cholesterol Cal: 49 mg/dL — ABNORMAL HIGH (ref 5–40)

## 2015-09-03 LAB — TSH: TSH: 0.808 u[IU]/mL (ref 0.450–4.500)

## 2016-04-01 ENCOUNTER — Encounter: Payer: Self-pay | Admitting: Family Medicine

## 2016-04-01 ENCOUNTER — Ambulatory Visit (INDEPENDENT_AMBULATORY_CARE_PROVIDER_SITE_OTHER): Payer: BLUE CROSS/BLUE SHIELD | Admitting: Family Medicine

## 2016-04-01 VITALS — BP 134/75 | HR 65 | Temp 98.9°F | Wt 158.0 lb

## 2016-04-01 DIAGNOSIS — B9789 Other viral agents as the cause of diseases classified elsewhere: Secondary | ICD-10-CM | POA: Diagnosis not present

## 2016-04-01 DIAGNOSIS — J069 Acute upper respiratory infection, unspecified: Secondary | ICD-10-CM | POA: Diagnosis not present

## 2016-04-01 DIAGNOSIS — Z720 Tobacco use: Secondary | ICD-10-CM | POA: Diagnosis not present

## 2016-04-01 MED ORDER — BENZONATATE 100 MG PO CAPS: 200.0000 mg | ORAL_CAPSULE | Freq: Three times a day (TID) | ORAL | 0 refills | Status: DC | PRN

## 2016-04-01 MED ORDER — HYDROCOD POLST-CPM POLST ER 10-8 MG/5ML PO SUER: 5.0000 mL | Freq: Two times a day (BID) | ORAL | 0 refills | Status: DC | PRN

## 2016-04-01 MED ORDER — PREDNISONE 20 MG PO TABS: 40.0000 mg | ORAL_TABLET | Freq: Every day | ORAL | 0 refills | Status: DC

## 2016-04-01 MED ORDER — ALBUTEROL SULFATE HFA 108 (90 BASE) MCG/ACT IN AERS: 2.0000 | INHALATION_SPRAY | Freq: Four times a day (QID) | RESPIRATORY_TRACT | 0 refills | Status: DC | PRN

## 2016-04-01 NOTE — Progress Notes (Signed)
BP 134/75   Pulse 65   Temp 98.9 F (37.2 C)   Wt 158 lb (71.7 kg)   SpO2 97%   BMI 25.12 kg/m    Subjective:    Patient ID: Joel Carr, male    DOB: [birth year], 31 y.o.   MRN: [id number]  HPI: Joel Carr is a 31 y.o. male  Chief Complaint  Patient presents with  . URI    x 4 days, sore throat, chest congestion, headache, low grade fever earlier, productive cough. No head congestion.    Patient presents with 4 day history of sore throat, chest congestion, HA, fevers, and productive cough. Denies CP, SOB, ear pain, but does note wheezing. Taking cold and flu medicines, no relief with those. Is a current everyday smoker. No sick contacts.   Past Medical History:  Diagnosis Date  . Bipolar disorder (HCC)   . Depression    Social History   Social History  . Marital status: Married    Spouse name: N/A  . Number of children: N/A  . Years of education: N/A   Occupational History  . Not on file.   Social History Main Topics  . Smoking status: Current Every Day Smoker    Packs/day: 0.50    Types: Cigarettes  . Smokeless tobacco: Never Used  . Alcohol use No  . Drug use: No  . Sexual activity: Not on file   Other Topics Concern  . Not on file   Social History Narrative  . No narrative on file    Relevant past medical, surgical, family and social history reviewed and updated as indicated. Interim medical history since our last visit reviewed. Allergies and medications reviewed and updated.  Review of Systems  Constitutional: Positive for fever.  HENT: Positive for congestion and sore throat.   Eyes: Negative.   Respiratory: Positive for cough, chest tightness and wheezing.   Gastrointestinal: Negative.   Genitourinary: Negative.   Musculoskeletal: Negative.   Neurological: Positive for headaches.  Psychiatric/Behavioral: Negative.     Per HPI unless specifically indicated above     Objective:    BP 134/75   Pulse 65   Temp 98.9 F (37.2 C)    Wt 158 lb (71.7 kg)   SpO2 97%   BMI 25.12 kg/m   Wt Readings from Last 3 Encounters:  04/01/16 158 lb (71.7 kg)  09/02/15 160 lb (72.6 kg)  03/06/15 169 lb (76.7 kg)    Physical Exam  Constitutional: He is oriented to person, place, and time. He appears well-developed and well-nourished.  HENT:  Head: Atraumatic.  Oropharynx erythematous  Eyes: Conjunctivae are normal. Pupils are equal, round, and reactive to light.  Neck: Normal range of motion. Neck supple.  Cardiovascular: Normal rate and normal heart sounds.   Pulmonary/Chest: Effort normal. No respiratory distress. He has wheezes (moderate diffuse wheezes b/l).  Musculoskeletal: Normal range of motion.  Lymphadenopathy:    He has no cervical adenopathy.  Neurological: He is alert and oriented to person, place, and time.  Skin: Skin is warm and dry.  Psychiatric: He has a normal mood and affect. His behavior is normal.  Nursing note and vitals reviewed.     Assessment & Plan:   Problem List Items Addressed This Visit    None    Visit Diagnoses    Viral URI with cough    -  Primary   Given wheezing and chest tightness, will treat with prednisone, albuterol, tessalon, and tussionex. Discussed  smoking cessation.    Tobacco abuse       Discussed importance of smoking cessation. Patient not yet ready to quit.        Follow up plan: Return if symptoms worsen or fail to improve.

## 2016-04-01 NOTE — Patient Instructions (Signed)
Follow up as needed

## 2016-05-01 ENCOUNTER — Emergency Department
Admission: EM | Admit: 2016-05-01 | Discharge: 2016-05-01 | Disposition: A | Discharge status: Home / Self Care | Payer: BLUE CROSS/BLUE SHIELD | Attending: Emergency Medicine | Admitting: Emergency Medicine

## 2016-05-01 ENCOUNTER — Encounter: Payer: Self-pay | Admitting: Emergency Medicine

## 2016-05-01 DIAGNOSIS — Z79899 Other long term (current) drug therapy: Secondary | ICD-10-CM | POA: Insufficient documentation

## 2016-05-01 DIAGNOSIS — F17210 Nicotine dependence, cigarettes, uncomplicated: Secondary | ICD-10-CM | POA: Insufficient documentation

## 2016-05-01 DIAGNOSIS — K0889 Other specified disorders of teeth and supporting structures: Secondary | ICD-10-CM | POA: Diagnosis not present

## 2016-05-01 MED ORDER — OXYCODONE-ACETAMINOPHEN 5-325 MG PO TABS: 1.0000 | ORAL_TABLET | Freq: Four times a day (QID) | ORAL | 0 refills | Status: DC | PRN

## 2016-05-01 MED ORDER — LIDOCAINE VISCOUS 2 % MT SOLN
15.0000 mL | Freq: Once | OROMUCOSAL | Status: AC
  Administered 2016-05-01: 15 mL via OROMUCOSAL
  Filled 2016-05-01: qty 15

## 2016-05-01 NOTE — ED Notes (Signed)
Pt was seen at [facility] and was told he would only get motrin so pt came here. Pt states otc meds are not helping. Pt c/o left upper dental pain.

## 2016-05-01 NOTE — ED Provider Notes (Signed)
[facility] Emergency Department Provider Note   ____________________________________________   First MD Initiated Contact with Patient 05/01/16 1709     (approximate)  I have reviewed the triage vital signs and the nursing notes.   HISTORY  Chief Complaint Dental Pain    HPI Joel Carr is a 31 y.o. male patient complaining of dental pain for 3 days. Patient to his contact his dentist will be seen in 2 days. Patient rates his pain as a 10 over 10.Patient stated no relief taking ibuprofen. Patient went to urgent care clinic was told that they would not give him something stronger than ibuprofen.   Past Medical History:  Diagnosis Date  . Bipolar disorder (HCC)   . Depression     Patient Active Problem List   Diagnosis Date Noted  . Hyperlipidemia 04/10/2015  . Bipolar disorder (HCC) 01/01/2015    Past Surgical History:  Procedure Laterality Date  . KNEE ARTHROSCOPY WITH MENISCAL REPAIR Right 10/29/2014   Procedure: KNEE ARTHROSCOPY WITH MENISCAL REPAIR;  Surgeon: Kennedy Bucker, MD;  Location: [facility] ORS;  Service: Orthopedics;  Laterality: Right;  . KNEE SURGERY Right   . TESTICLE SURGERY N/A    as a child    Prior to Admission medications   Medication Sig Start Date End Date Taking? Authorizing Provider  albuterol (PROVENTIL HFA;VENTOLIN HFA) 108 (90 Base) MCG/ACT inhaler Inhale 2 puffs into the lungs every 6 (six) hours as needed for wheezing or shortness of breath. 04/01/16   Particia Nearing, PA-C  benzonatate (TESSALON) 100 MG capsule Take 2 capsules (200 mg total) by mouth 3 (three) times daily as needed. 04/01/16   Particia Nearing, PA-C  chlorpheniramine-HYDROcodone [facility]) 10-8 MG/5ML SUER Take 5 mLs by mouth every 12 (twelve) hours as needed for cough. 04/01/16   Particia Nearing, PA-C  clonazePAM (KLONOPIN) 0.5 MG tablet 0.5 mg daily. 03/05/15   Historical Provider, MD  LamoTRIgine 250 MG TB24 Take 250 mg  by mouth daily. 03/04/16   Historical Provider, MD  oxyCODONE-acetaminophen (ROXICET) 5-325 MG tablet Take 1 tablet by mouth every 6 (six) hours as needed for moderate pain. 05/01/16   Joni Reining, PA-C  predniSONE (DELTASONE) 20 MG tablet Take 2 tablets (40 mg total) by mouth daily with breakfast. 04/01/16   Particia Nearing, PA-C  SAPHRIS 10 MG SUBL Take 10 mg by mouth daily. 03/04/16   Historical Provider, MD    Allergies Patient has no known allergies.  Family History  Problem Relation Age of Onset  . Diabetes Paternal Grandfather   . Heart attack Paternal Grandfather     Social History Social History  Substance Use Topics  . Smoking status: Current Every Day Smoker    Packs/day: 0.50    Types: Cigarettes  . Smokeless tobacco: Never Used  . Alcohol use No    Review of Systems Constitutional: No fever/chills Eyes: No visual changes. ENT: No sore throat. Cardiovascular: Denies chest pain. Respiratory: Denies shortness of breath. Gastrointestinal: No abdominal pain.  No nausea, no vomiting.  No diarrhea.  No constipation. Genitourinary: Negative for dysuria. Musculoskeletal: Negative for back pain. Skin: Negative for rash. Neurological: Negative for headaches, focal weakness or numbness. Psychiatric:Bipolar Endocrine:Hyperlipidemia _____________   PHYSICAL EXAM:  VITAL SIGNS: ED Triage Vitals  Enc Vitals Group     BP 05/01/16 1613 119/77     Pulse Rate 05/01/16 1613 76     Resp 05/01/16 1613 18     Temp 05/01/16  1613 97.8 F (36.6 C)     Temp Source 05/01/16 1613 Oral     SpO2 05/01/16 1613 98 %     Weight 05/01/16 1613 163 lb (73.9 kg)     Height 05/01/16 1613 5\' 9"  (1.753 m)     Head Circumference --      Peak Flow --      Pain Score 05/01/16 1614 10     Pain Loc --      Pain Edu? --      Excl. in GC? --     Constitutional: Alert and oriented. Well appearing and in no acute distress. Eyes: Conjunctivae are normal. PERRL. EOMI. Head:  Atraumatic. Nose: No congestion/rhinnorhea. Mouth/Throat: Mucous membranes are moist.  Oropharynx non-erythematous. The vital line is teeth #13 with mild gingival edema. Neck: No stridor.  No cervical spine tenderness to palpation. Hematological/Lymphatic/Immunilogical: No cervical lymphadenopathy. Cardiovascular: Normal rate, regular rhythm. Grossly normal heart sounds.  Good peripheral circulation. Respiratory: Normal respiratory effort.  No retractions. Lungs CTAB. Gastrointestinal: Soft and nontender. No distention. No abdominal bruits. No CVA tenderness. Musculoskeletal: No lower extremity tenderness nor edema.  No joint effusions. Neurologic:  Normal speech and language. No gross focal neurologic deficits are appreciated. No gait instability. Skin:  Skin is warm, dry and intact. No rash noted. Psychiatric: Mood and affect are normal. Speech and behavior are normal.  ____________________________________________   LABS (all labs ordered are listed, but only abnormal results are displayed)  Labs Reviewed - No data to display ____________________________________________  EKG   ____________________________________________  RADIOLOGY   ____________________________________________   PROCEDURES  Procedure(s) performed: None  Procedures  Critical Care performed: No  ____________________________________________   INITIAL IMPRESSION / ASSESSMENT AND PLAN / ED COURSE  Pertinent labs & imaging results that were available during my care of the patient were reviewed by me and considered in my medical decision making (see chart for details).  Dental pain. Patient given discharge Instructions. Patient given prescription for 3 days a Percocets. Patient advised follow-up with scheduled dental appointment in 2 days.      ____________________________________________   FINAL CLINICAL IMPRESSION(S) / ED DIAGNOSES  Final diagnoses:  Pain, dental      NEW MEDICATIONS  STARTED DURING THIS VISIT:  New Prescriptions   OXYCODONE-ACETAMINOPHEN (ROXICET) 5-325 MG TABLET    Take 1 tablet by mouth every 6 (six) hours as needed for moderate pain.     Note:  This document was prepared using Dragon voice recognition software and may include unintentional dictation errors.    Joni ReiningRonald K Smith, PA-C 05/01/16 1734    Arnaldo NatalPaul F Malinda, MD 05/01/16 2206

## 2016-05-01 NOTE — ED Triage Notes (Signed)
Toothache upper R x 3 days.

## 2016-05-03 ENCOUNTER — Ambulatory Visit (INDEPENDENT_AMBULATORY_CARE_PROVIDER_SITE_OTHER): Payer: BLUE CROSS/BLUE SHIELD | Admitting: Family Medicine

## 2016-05-03 ENCOUNTER — Encounter: Payer: Self-pay | Admitting: Family Medicine

## 2016-05-03 DIAGNOSIS — E7800 Pure hypercholesterolemia, unspecified: Secondary | ICD-10-CM | POA: Diagnosis not present

## 2016-05-03 MED ORDER — VARENICLINE TARTRATE 1 MG PO TABS: 1.0000 mg | ORAL_TABLET | Freq: Two times a day (BID) | ORAL | 4 refills | Status: DC

## 2016-05-03 MED ORDER — VARENICLINE TARTRATE 0.5 MG X 11 & 1 MG X 42 PO MISC: ORAL | 0 refills | Status: DC

## 2016-05-03 NOTE — Assessment & Plan Note (Signed)
Reviewed diet exercise nutrition to control no need for medications at this point.

## 2016-05-03 NOTE — Progress Notes (Signed)
BP 121/79   Pulse 73   Temp 97.8 F (36.6 C) (Oral)   Ht 5\' 8"  (1.727 m)   Wt 159 lb (72.1 kg)   SpO2 99%   BMI 24.18 kg/m    Subjective:    Patient ID: Joel Carr, male    DOB: 09/13/1985, 31 y.o.   MRN: [id number]  HPI: Joel Carr is a 31 y.o. male  Chief Complaint  Patient presents with  . Follow-up  . Hyperlipidemia  . Nicotine Dependence    Wants to quit  Patient with lipid panel done which was reviewed with patient triglycerides up slightly. Patient will work on diet exercise nutrition to affect control. Patient wants to quit smoking reviewed nicotine dependence reviewed drug interactions patient's tried to quit before and cold [place] was very difficult. Work is stable  Relevant past medical, surgical, family and social history reviewed and updated as indicated. Interim medical history since our last visit reviewed. Allergies and medications reviewed and updated.  Review of Systems  Constitutional: Negative.   Respiratory: Negative.   Cardiovascular: Negative.     Per HPI unless specifically indicated above     Objective:    BP 121/79   Pulse 73   Temp 97.8 F (36.6 C) (Oral)   Ht 5\' 8"  (1.727 m)   Wt 159 lb (72.1 kg)   SpO2 99%   BMI 24.18 kg/m   Wt Readings from Last 3 Encounters:  05/03/16 159 lb (72.1 kg)  05/01/16 163 lb (73.9 kg)  04/01/16 158 lb (71.7 kg)    Physical Exam  Constitutional: He is oriented to person, place, and time. He appears well-developed and well-nourished. No distress.  HENT:  Head: Normocephalic and atraumatic.  Right Ear: Hearing normal.  Left Ear: Hearing normal.  Nose: Nose normal.  Eyes: Conjunctivae and lids are normal. Right eye exhibits no discharge. Left eye exhibits no discharge. No scleral icterus.  Cardiovascular: Normal rate, regular rhythm and normal heart sounds.   Pulmonary/Chest: Effort normal and breath sounds normal. No respiratory distress.  Musculoskeletal: Normal range of motion.    Neurological: He is alert and oriented to person, place, and time.  Skin: Skin is intact. No rash noted.  Psychiatric: He has a normal mood and affect. His speech is normal and behavior is normal. Judgment and thought content normal. Cognition and memory are normal.    Results for orders placed or performed in visit on 09/02/15  CBC with Differential/Platelet  Result Value Ref Range   WBC 7.9 3.4 - 10.8 x10E3/uL   RBC 5.07 4.14 - 5.80 x10E6/uL   Hemoglobin 15.5 12.6 - 17.7 g/dL   Hematocrit 40.944.6 81.137.5 - 51.0 %   MCV 88 79 - 97 fL   MCH 30.6 26.6 - 33.0 pg   MCHC 34.8 31.5 - 35.7 g/dL   RDW 91.413.5 78.212.3 - 95.615.4 %   Platelets 229 150 - 379 x10E3/uL   Neutrophils 65 %   Lymphs 27 %   Monocytes 7 %   Eos 1 %   Basos 0 %   Neutrophils Absolute 5.2 1.4 - 7.0 x10E3/uL   Lymphocytes Absolute 2.1 0.7 - 3.1 x10E3/uL   Monocytes Absolute 0.5 0.1 - 0.9 x10E3/uL   EOS (ABSOLUTE) 0.1 0.0 - 0.4 x10E3/uL   Basophils Absolute 0.0 0.0 - 0.2 x10E3/uL   Immature Granulocytes 0 %   Immature Grans (Abs) 0.0 0.0 - 0.1 x10E3/uL  Lipid Panel w/o Chol/HDL Ratio  Result Value Ref Range  Cholesterol, Total 201 (H) 100 - 199 mg/dL   Triglycerides 045 (H) 0 - 149 mg/dL   HDL 36 (L) >40 mg/dL   VLDL Cholesterol Cal 49 (H) 5 - 40 mg/dL   LDL Calculated 981 (H) 0 - 99 mg/dL  Comprehensive metabolic panel  Result Value Ref Range   Glucose 74 65 - 99 mg/dL   BUN 5 (L) 6 - 20 mg/dL   Creatinine, Ser 1.91 0.76 - 1.27 mg/dL   GFR calc non Af Amer 121 >59 mL/min/1.73   GFR calc Af Amer 140 >59 mL/min/1.73   BUN/Creatinine Ratio 6 (L) 9 - 20   Sodium 144 134 - 144 mmol/L   Potassium 4.3 3.5 - 5.2 mmol/L   Chloride 102 96 - 106 mmol/L   CO2 24 18 - 29 mmol/L   Calcium 9.7 8.7 - 10.2 mg/dL   Total Protein 6.6 6.0 - 8.5 g/dL   Albumin 4.9 3.5 - 5.5 g/dL   Globulin, Total 1.7 1.5 - 4.5 g/dL   Albumin/Globulin Ratio 2.9 (H) 1.2 - 2.2   Bilirubin Total 0.3 0.0 - 1.2 mg/dL   Alkaline Phosphatase 76 39 - 117  IU/L   AST 19 0 - 40 IU/L   ALT 16 0 - 44 IU/L  TSH  Result Value Ref Range   TSH 0.808 0.450 - 4.500 uIU/mL  Urinalysis, Routine w reflex microscopic (not at [facility])  Result Value Ref Range   Specific Gravity, UA <1.005 (L) 1.005 - 1.030   pH, UA 7.0 5.0 - 7.5   Color, UA Yellow Yellow   Appearance Ur Clear Clear   Leukocytes, UA Negative Negative   Protein, UA Negative Negative/Trace   Glucose, UA Negative Negative   Ketones, UA Negative Negative   RBC, UA Negative Negative   Bilirubin, UA Negative Negative   Urobilinogen, Ur 0.2 0.2 - 1.0 mg/dL   Nitrite, UA Negative Negative  HIV antibody  Result Value Ref Range   HIV Screen 4th Generation wRfx Non Reactive Non Reactive      Assessment & Plan:   Problem List Items Addressed This Visit      Other   Hyperlipidemia    Reviewed diet exercise nutrition to control no need for medications at this point.        Reviewed smoking cessation and Chantix due to drug interaction checking no interactions with patient's list of medications.  Follow up plan: Return for As scheduled.

## 2016-09-02 ENCOUNTER — Encounter: Payer: Self-pay | Admitting: *Deleted

## 2016-09-02 ENCOUNTER — Emergency Department
Admission: EM | Admit: 2016-09-02 | Discharge: 2016-09-02 | Disposition: A | Discharge status: Home / Self Care | Payer: BLUE CROSS/BLUE SHIELD | Attending: Emergency Medicine | Admitting: Emergency Medicine

## 2016-09-02 DIAGNOSIS — Z87891 Personal history of nicotine dependence: Secondary | ICD-10-CM | POA: Insufficient documentation

## 2016-09-02 DIAGNOSIS — R197 Diarrhea, unspecified: Secondary | ICD-10-CM

## 2016-09-02 DIAGNOSIS — R1084 Generalized abdominal pain: Secondary | ICD-10-CM

## 2016-09-02 DIAGNOSIS — E876 Hypokalemia: Secondary | ICD-10-CM | POA: Insufficient documentation

## 2016-09-02 DIAGNOSIS — K529 Noninfective gastroenteritis and colitis, unspecified: Secondary | ICD-10-CM | POA: Diagnosis not present

## 2016-09-02 DIAGNOSIS — Z79899 Other long term (current) drug therapy: Secondary | ICD-10-CM | POA: Insufficient documentation

## 2016-09-02 DIAGNOSIS — R112 Nausea with vomiting, unspecified: Secondary | ICD-10-CM | POA: Diagnosis present

## 2016-09-02 LAB — MAGNESIUM: Magnesium: 1.6 mg/dL — ABNORMAL LOW (ref 1.7–2.4)

## 2016-09-02 LAB — COMPREHENSIVE METABOLIC PANEL
ALT: 17 U/L (ref 17–63)
ANION GAP: 9 (ref 5–15)
AST: 22 U/L (ref 15–41)
Albumin: 4.9 g/dL (ref 3.5–5.0)
Alkaline Phosphatase: 59 U/L (ref 38–126)
BILIRUBIN TOTAL: 0.8 mg/dL (ref 0.3–1.2)
CO2: 25 mmol/L (ref 22–32)
Calcium: 9.7 mg/dL (ref 8.9–10.3)
Chloride: 100 mmol/L — ABNORMAL LOW (ref 101–111)
Creatinine, Ser: 0.65 mg/dL (ref 0.61–1.24)
Glucose, Bld: 97 mg/dL (ref 65–99)
POTASSIUM: 2.9 mmol/L — AB (ref 3.5–5.1)
Sodium: 134 mmol/L — ABNORMAL LOW (ref 135–145)
TOTAL PROTEIN: 6.9 g/dL (ref 6.5–8.1)

## 2016-09-02 LAB — CBC
HEMATOCRIT: 39.6 % — AB (ref 40.0–52.0)
Hemoglobin: 14 g/dL (ref 13.0–18.0)
MCH: 30.8 pg (ref 26.0–34.0)
MCHC: 35.4 g/dL (ref 32.0–36.0)
MCV: 87.1 fL (ref 80.0–100.0)
Platelets: 202 10*3/uL (ref 150–440)
RBC: 4.54 MIL/uL (ref 4.40–5.90)
RDW: 12.9 % (ref 11.5–14.5)
WBC: 7.8 10*3/uL (ref 3.8–10.6)

## 2016-09-02 LAB — URINALYSIS, COMPLETE (UACMP) WITH MICROSCOPIC
BACTERIA UA: NONE SEEN
BILIRUBIN URINE: NEGATIVE
Glucose, UA: NEGATIVE mg/dL
Hgb urine dipstick: NEGATIVE
KETONES UR: NEGATIVE mg/dL
LEUKOCYTES UA: NEGATIVE
NITRITE: NEGATIVE
PH: 7 (ref 5.0–8.0)
Protein, ur: NEGATIVE mg/dL
RBC / HPF: NONE SEEN RBC/hpf (ref 0–5)
Specific Gravity, Urine: 1.001 — ABNORMAL LOW (ref 1.005–1.030)
Squamous Epithelial / LPF: NONE SEEN
WBC, UA: NONE SEEN WBC/hpf (ref 0–5)

## 2016-09-02 LAB — LIPASE, BLOOD: LIPASE: 26 U/L (ref 11–51)

## 2016-09-02 MED ORDER — ONDANSETRON HCL 4 MG/2ML IJ SOLN
4.0000 mg | Freq: Once | INTRAMUSCULAR | Status: AC
  Administered 2016-09-02: 4 mg via INTRAVENOUS
  Filled 2016-09-02: qty 2

## 2016-09-02 MED ORDER — MAGNESIUM CHLORIDE 64 MG PO TBEC
1.0000 | DELAYED_RELEASE_TABLET | Freq: Once | ORAL | Status: AC
  Administered 2016-09-02: 64 mg via ORAL
  Filled 2016-09-02: qty 1

## 2016-09-02 MED ORDER — FAMOTIDINE IN NACL 20-0.9 MG/50ML-% IV SOLN
20.0000 mg | Freq: Once | INTRAVENOUS | Status: AC
  Administered 2016-09-02: 20 mg via INTRAVENOUS
  Filled 2016-09-02: qty 50

## 2016-09-02 MED ORDER — SODIUM CHLORIDE 0.9 % IV BOLUS (SEPSIS)
1000.0000 mL | Freq: Once | INTRAVENOUS | Status: AC
  Administered 2016-09-02: 1000 mL via INTRAVENOUS

## 2016-09-02 MED ORDER — ONDANSETRON 4 MG PO TBDP: 4.0000 mg | ORAL_TABLET | Freq: Three times a day (TID) | ORAL | 0 refills | Status: DC | PRN

## 2016-09-02 MED ORDER — MAGNESIUM SULFATE 2 GM/50ML IV SOLN: 2.0000 g | Freq: Once | INTRAVENOUS | Status: DC

## 2016-09-02 MED ORDER — KETOROLAC TROMETHAMINE 30 MG/ML IJ SOLN
15.0000 mg | Freq: Once | INTRAMUSCULAR | Status: AC
  Administered 2016-09-02: 15 mg via INTRAVENOUS

## 2016-09-02 MED ORDER — POTASSIUM CHLORIDE CRYS ER 20 MEQ PO TBCR
40.0000 meq | EXTENDED_RELEASE_TABLET | Freq: Once | ORAL | Status: AC
  Administered 2016-09-02: 40 meq via ORAL
  Filled 2016-09-02: qty 2

## 2016-09-02 MED ORDER — KETOROLAC TROMETHAMINE 30 MG/ML IJ SOLN
INTRAMUSCULAR | Status: AC
  Filled 2016-09-02: qty 1

## 2016-09-02 MED ORDER — POTASSIUM CHLORIDE 10 MEQ/100ML IV SOLN
10.0000 meq | Freq: Once | INTRAVENOUS | Status: AC
  Administered 2016-09-02: 10 meq via INTRAVENOUS
  Filled 2016-09-02: qty 100

## 2016-09-02 MED ORDER — ACETAMINOPHEN 500 MG PO TABS
1000.0000 mg | ORAL_TABLET | Freq: Once | ORAL | Status: AC
  Administered 2016-09-02: 1000 mg via ORAL
  Filled 2016-09-02: qty 2

## 2016-09-02 NOTE — ED Triage Notes (Signed)
PT to ED reporting NVD for the past two days with upper abd pain. No fevers at home and decreased appetite reported.  Pt als reports increased anxiety with work.

## 2016-09-02 NOTE — ED Provider Notes (Signed)
[facility] Emergency Department Provider Note  ____________________________________________  Time seen: Approximately 9:26 PM  I have reviewed the triage vital signs and the nursing notes.   HISTORY  Chief Complaint Abdominal Pain   HPI Joel Carr is a 31 y.o. male with a history of bipolar disorder who presents for evaluation of nausea, vomiting, diarrhea. Patient reports his symptoms have been going on for 2 days. He reports average of 6 daily episodes of watery diarrhea and 6-8 episodes of nonbloody nonbilious emesis. Has had chills but no fever. He endorses diffuse intermittent crampy abdominal painand also dull/ burning pain on his epigastric region which is constant. Pains is currently 6/10. No dysuria or hematuria, no melena, no coffee ground emesis, no chest pain or shortness of breath, no recent antibiotics use, no history of C. Difficile. Patient reports feeling dizzy during the day today like he was going to pass out.  Past Medical History:  Diagnosis Date  . Bipolar disorder (HCC)   . Depression     Patient Active Problem List   Diagnosis Date Noted  . Hyperlipidemia 04/10/2015  . Bipolar disorder (HCC) 01/01/2015    Past Surgical History:  Procedure Laterality Date  . KNEE ARTHROSCOPY WITH MENISCAL REPAIR Right 10/29/2014   Procedure: KNEE ARTHROSCOPY WITH MENISCAL REPAIR;  Surgeon: Kennedy BuckerMichael Menz, MD;  Location: [facility] ORS;  Service: Orthopedics;  Laterality: Right;  . KNEE SURGERY Right   . TESTICLE SURGERY N/A    as a child    Prior to Admission medications   Medication Sig Start Date End Date Taking? Authorizing Provider  acetaminophen-codeine (TYLENOL #3) 300-30 MG tablet  03/15/16   [provider]  albuterol (PROVENTIL HFA;VENTOLIN HFA) 108 (90 Base) MCG/ACT inhaler Inhale 2 puffs into the lungs every 6 (six) hours as needed for wheezing or shortness of breath. 04/01/16   Particia NearingLane, Rachel Elizabeth, PA-C  clonazePAM (KLONOPIN)  0.5 MG tablet 0.5 mg daily. 03/05/15   [provider]  LamoTRIgine 250 MG TB24 Take 250 mg by mouth daily. 03/04/16   [provider]  ondansetron (ZOFRAN ODT) 4 MG disintegrating tablet Take 1 tablet (4 mg total) by mouth every 8 (eight) hours as needed for nausea or vomiting. 09/02/16   Nita SickleVeronese, Section, MD  oxyCODONE-acetaminophen (ROXICET) 5-325 MG tablet Take 1 tablet by mouth every 6 (six) hours as needed for moderate pain. 05/01/16   Joni ReiningSmith, Ronald K, PA-C  penicillin v potassium (VEETID) 500 MG tablet  05/02/16   [provider]  SAPHRIS 10 MG SUBL Take 10 mg by mouth daily. 03/04/16   [provider]  varenicline (CHANTIX CONTINUING MONTH PAK) 1 MG tablet Take 1 tablet (1 mg total) by mouth 2 (two) times daily. 05/03/16   Steele Sizerrissman, Mark A, MD  varenicline (CHANTIX STARTING MONTH PAK) 0.5 MG X 11 & 1 MG X 42 tablet 1 tab 0.5mg   by mouth  daily 3 days, then increase to one 0.5mg   twice daily for 4 days, then increase to one 1mg  tablet twice daily. 05/03/16   Steele Sizerrissman, Mark A, MD    Allergies Patient has no known allergies.  Family History  Problem Relation Age of Onset  . Diabetes Paternal Grandfather   . Heart attack Paternal Grandfather     Social History Social History  Substance Use Topics  . Smoking status: Former Smoker    Packs/day: 0.00  . Smokeless tobacco: Never Used  . Alcohol use No    Review of Systems  Constitutional:  Negative for fever. + dizziness Eyes: Negative for visual changes. ENT: Negative for sore throat. Neck: No neck pain  Cardiovascular: Negative for chest pain. Respiratory: Negative for shortness of breath. Gastrointestinal: + abdominal pain, vomiting and diarrhea. Genitourinary: Negative for dysuria. Musculoskeletal: Negative for back pain. Skin: Negative for rash. Neurological: Negative for headaches, weakness or numbness. Psych: No SI or HI  ____________________________________________   PHYSICAL  EXAM:  VITAL SIGNS: ED Triage Vitals  Enc Vitals Group     BP 09/02/16 1944 125/90     Pulse Rate 09/02/16 1944 67     Resp 09/02/16 1944 16     Temp 09/02/16 1944 97.8 F (36.6 C)     Temp Source 09/02/16 1944 Oral     SpO2 09/02/16 1944 99 %     Weight 09/02/16 1945 165 lb (74.8 kg)     Height 09/02/16 1945 5\' 8"  (1.727 m)     Head Circumference --      Peak Flow --      Pain Score 09/02/16 1944 5     Pain Loc --      Pain Edu? --      Excl. in GC? --     Constitutional: Alert and oriented. Well appearing and in no apparent distress. HEENT:      Head: Normocephalic and atraumatic.         Eyes: Conjunctivae are normal. Sclera is non-icteric.       Mouth/Throat: Mucous membranes are moist.       Neck: Supple with no signs of meningismus. Cardiovascular: Regular rate and rhythm. No murmurs, gallops, or rubs. 2+ symmetrical distal pulses are present in all extremities. No JVD. Respiratory: Normal respiratory effort. Lungs are clear to auscultation bilaterally. No wheezes, crackles, or rhonchi.  Gastrointestinal: Soft, mild diffuse tenderness to palpation, and non distended with positive bowel sounds. No rebound or guarding. Genitourinary: No CVA tenderness. Musculoskeletal: Nontender with normal range of motion in all extremities. No edema, cyanosis, or erythema of extremities. Neurologic: Normal speech and language. Face is symmetric. Moving all extremities. No gross focal neurologic deficits are appreciated. Skin: Skin is warm, dry and intact. No rash noted. Psychiatric: Mood and affect are normal. Speech and behavior are normal.  ____________________________________________   LABS (all labs ordered are listed, but only abnormal results are displayed)  Labs Reviewed  COMPREHENSIVE METABOLIC PANEL - Abnormal; Notable for the following:       Result Value   Sodium 134 (*)    Potassium 2.9 (*)    Chloride 100 (*)    BUN <5 (*)    All other components within normal  limits  CBC - Abnormal; Notable for the following:    HCT 39.6 (*)    All other components within normal limits  URINALYSIS, COMPLETE (UACMP) WITH MICROSCOPIC - Abnormal; Notable for the following:    Color, Urine COLORLESS (*)    APPearance CLEAR (*)    Specific Gravity, Urine 1.001 (*)    All other components within normal limits  MAGNESIUM - Abnormal; Notable for the following:    Magnesium 1.6 (*)    All other components within normal limits  LIPASE, BLOOD   ____________________________________________  EKG  none  ____________________________________________  RADIOLOGY  none  ____________________________________________   PROCEDURES  Procedure(s) performed: None Procedures Critical Care performed:  None ____________________________________________   INITIAL IMPRESSION / ASSESSMENT AND PLAN / ED COURSE  31 y.o. male with a history of bipolar disorder who presents for evaluation of  nausea, vomiting, diarrhea x 2 days. Patient is in no distress, has normal vital signs, abdomen is diffusely tender to palpation with no localized tenderness, no rebound or guarding. CMP showing hypokalemia with potassium of 2.9, we'll supplement by mouth and IV. Lipase is negative. CBC with no leukocytosis. UA pending to eval level of dehydration. Will check Mag. Will give IVF, IV zofran, pepcid, and tylenol.     _________________________ 11:18 PM on 09/02/2016 -----------------------------------------  Patient with hypokalemia and hypomagnesemia in the setting of gastroenteritis. Both repleted in the emergency room. Patient is tolerating by mouth with no further episodes of vomiting or diarrhea and emergency room. This could be discharged home on Zofran and increase oral hydration. Recommended close follow-up with primary care doctor.  Pertinent labs & imaging results that were available during my care of the patient were reviewed by me and considered in my medical decision making (see  chart for details).    ____________________________________________   FINAL CLINICAL IMPRESSION(S) / ED DIAGNOSES  Final diagnoses:  Nausea vomiting and diarrhea  Gastroenteritis  Generalized abdominal pain      NEW MEDICATIONS STARTED DURING THIS VISIT:  New Prescriptions   ONDANSETRON (ZOFRAN ODT) 4 MG DISINTEGRATING TABLET    Take 1 tablet (4 mg total) by mouth every 8 (eight) hours as needed for nausea or vomiting.     Note:  This document was prepared using Dragon voice recognition software and may include unintentional dictation errors.    Don Perking, [place], MD 09/02/16 [id number]

## 2016-09-09 ENCOUNTER — Encounter: Payer: BLUE CROSS/BLUE SHIELD | Admitting: Family Medicine

## 2016-09-13 ENCOUNTER — Encounter: Payer: Self-pay | Admitting: Family Medicine

## 2016-10-03 ENCOUNTER — Encounter: Payer: Self-pay | Admitting: Family Medicine

## 2016-10-05 ENCOUNTER — Ambulatory Visit (INDEPENDENT_AMBULATORY_CARE_PROVIDER_SITE_OTHER): Payer: BLUE CROSS/BLUE SHIELD | Admitting: Family Medicine

## 2016-10-05 ENCOUNTER — Encounter: Payer: Self-pay | Admitting: Family Medicine

## 2016-10-05 VITALS — BP 128/83 | HR 71 | Ht 68.5 in | Wt 167.0 lb

## 2016-10-05 DIAGNOSIS — F3131 Bipolar disorder, current episode depressed, mild: Secondary | ICD-10-CM | POA: Diagnosis not present

## 2016-10-05 DIAGNOSIS — Z1329 Encounter for screening for other suspected endocrine disorder: Secondary | ICD-10-CM | POA: Diagnosis not present

## 2016-10-05 DIAGNOSIS — Z131 Encounter for screening for diabetes mellitus: Secondary | ICD-10-CM

## 2016-10-05 DIAGNOSIS — Z0000 Encounter for general adult medical examination without abnormal findings: Secondary | ICD-10-CM

## 2016-10-05 DIAGNOSIS — E7800 Pure hypercholesterolemia, unspecified: Secondary | ICD-10-CM

## 2016-10-05 LAB — URINALYSIS, ROUTINE W REFLEX MICROSCOPIC
Bilirubin, UA: NEGATIVE
Glucose, UA: NEGATIVE
KETONES UA: NEGATIVE
Leukocytes, UA: NEGATIVE
Nitrite, UA: NEGATIVE
PH UA: 7 (ref 5.0–7.5)
Protein, UA: NEGATIVE
RBC UA: NEGATIVE
SPEC GRAV UA: 1.01 (ref 1.005–1.030)
UUROB: 0.2 mg/dL (ref 0.2–1.0)

## 2016-10-05 NOTE — Assessment & Plan Note (Signed)
Diet controlled.  

## 2016-10-05 NOTE — Progress Notes (Signed)
BP 128/83   Pulse 71   Ht 5' 8.5" (1.74 m)   Wt 167 lb (75.8 kg)   SpO2 99%   BMI 25.02 kg/m    Subjective:    Patient ID: Joel Carr, male    DOB: Apr 06, 1985, 31 y.o.   MRN: [id number]  HPI: SAMEUL Carr is a 31 y.o. male  Chief Complaint  Patient presents with  . Annual Exam  Agents nerves doing okay working with Dr. Maryruth Bun. 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 Patient is looking for another job.  Relevant past medical, surgical, family and social history reviewed and updated as indicated. Interim medical history since our last visit reviewed. Allergies and medications reviewed and updated.  Review of Systems  Constitutional: Negative.   HENT: Negative.   Eyes: Negative.   Respiratory: Negative.   Cardiovascular: Negative.   Gastrointestinal: Negative.   Endocrine: Negative.   Genitourinary: Negative.   Musculoskeletal: Negative.   Skin: Negative.   Allergic/Immunologic: Negative.   Neurological: Negative.   Hematological: Negative.   Psychiatric/Behavioral: Negative.     Per HPI unless specifically indicated above     Objective:    BP 128/83   Pulse 71   Ht 5' 8.5" (1.74 m)   Wt 167 lb (75.8 kg)   SpO2 99%   BMI 25.02 kg/m   Wt Readings from Last 3 Encounters:  10/05/16 167 lb (75.8 kg)  09/02/16 165 lb (74.8 kg)  05/03/16 159 lb (72.1 kg)    Physical Exam  Constitutional: He is oriented to person, place, and time. He appears well-developed and well-nourished.  HENT:  Head: Normocephalic and atraumatic.  Right Ear: External ear normal.  Left Ear: External ear normal.  Eyes: Conjunctivae and EOM are normal. Pupils are equal, round, and reactive to light.  Neck: Normal range of motion. Neck supple.  Cardiovascular: Normal rate, regular rhythm, normal heart sounds and intact distal pulses.   Pulmonary/Chest: Effort normal and breath sounds normal.  Abdominal: Soft. Bowel sounds  are normal. There is no splenomegaly or hepatomegaly.  Genitourinary: Rectum normal and penis normal.  Musculoskeletal: Normal range of motion.  Neurological: He is alert and oriented to person, place, and time. He has normal reflexes.  Skin: No rash noted. No erythema.  Psychiatric: He has a normal mood and affect. His behavior is normal. Judgment and thought content normal.    Results for orders placed or performed during the hospital encounter of 09/02/16  Lipase, blood  Result Value Ref Range   Lipase 26 11 - 51 U/L  Comprehensive metabolic panel  Result Value Ref Range   Sodium 134 (L) 135 - 145 mmol/L   Potassium 2.9 (L) 3.5 - 5.1 mmol/L   Chloride 100 (L) 101 - 111 mmol/L   CO2 25 22 - 32 mmol/L   Glucose, Bld 97 65 - 99 mg/dL   BUN <5 (L) 6 - 20 mg/dL   Creatinine, Ser 4.09 0.61 - 1.24 mg/dL   Calcium 9.7 8.9 - 81.1 mg/dL   Total Protein 6.9 6.5 - 8.1 g/dL   Albumin 4.9 3.5 - 5.0 g/dL   AST 22 15 - 41 U/L   ALT 17 17 - 63 U/L   Alkaline Phosphatase 59 38 - 126 U/L   Total Bilirubin 0.8 0.3 - 1.2 mg/dL   GFR calc non Af Amer >60 >60 mL/min   GFR calc Af Amer >60 >  60 mL/min   Anion gap 9 5 - 15  CBC  Result Value Ref Range   WBC 7.8 3.8 - 10.6 K/uL   RBC 4.54 4.40 - 5.90 MIL/uL   Hemoglobin 14.0 13.0 - 18.0 g/dL   HCT 29.539.6 (L) 62.140.0 - 30.852.0 %   MCV 87.1 80.0 - 100.0 fL   MCH 30.8 26.0 - 34.0 pg   MCHC 35.4 32.0 - 36.0 g/dL   RDW 65.712.9 84.611.5 - 96.214.5 %   Platelets 202 150 - 440 K/uL  Urinalysis, Complete w Microscopic  Result Value Ref Range   Color, Urine COLORLESS (A) YELLOW   APPearance CLEAR (A) CLEAR   Specific Gravity, Urine 1.001 (L) 1.005 - 1.030   pH 7.0 5.0 - 8.0   Glucose, UA NEGATIVE NEGATIVE mg/dL   Hgb urine dipstick NEGATIVE NEGATIVE   Bilirubin Urine NEGATIVE NEGATIVE   Ketones, ur NEGATIVE NEGATIVE mg/dL   Protein, ur NEGATIVE NEGATIVE mg/dL   Nitrite NEGATIVE NEGATIVE   Leukocytes, UA NEGATIVE NEGATIVE   RBC / HPF NONE SEEN 0 - 5 RBC/hpf   WBC,  UA NONE SEEN 0 - 5 WBC/hpf   Bacteria, UA NONE SEEN NONE SEEN   Squamous Epithelial / LPF NONE SEEN NONE SEEN  Magnesium  Result Value Ref Range   Magnesium 1.6 (L) 1.7 - 2.4 mg/dL      Assessment & Plan:   Problem List Items Addressed This Visit      Other   Bipolar disorder (HCC)    Followed by psychiatry and doing well.      Hyperlipidemia    Diet controlled      Relevant Orders   CBC with Differential/Platelet   Lipid panel    Other Visit Diagnoses    Annual physical exam    -  Primary   Routine general medical examination at a health care facility       Screening for diabetes mellitus (DM)       Relevant Orders   Comprehensive metabolic panel   Urinalysis, Routine w reflex microscopic   Thyroid disorder screen       Relevant Orders   TSH       Follow up plan: Return in about 1 year (around 10/05/2017) for Physical Exam.

## 2016-10-05 NOTE — Assessment & Plan Note (Signed)
Followed by psychiatry and doing well. 

## 2016-10-06 ENCOUNTER — Encounter: Payer: Self-pay | Admitting: Family Medicine

## 2016-10-06 LAB — CBC WITH DIFFERENTIAL/PLATELET
BASOS ABS: 0 10*3/uL (ref 0.0–0.2)
Basos: 0 %
EOS (ABSOLUTE): 0.1 10*3/uL (ref 0.0–0.4)
EOS: 1 %
HEMATOCRIT: 43.5 % (ref 37.5–51.0)
HEMOGLOBIN: 14.8 g/dL (ref 13.0–17.7)
IMMATURE GRANS (ABS): 0 10*3/uL (ref 0.0–0.1)
Immature Granulocytes: 0 %
LYMPHS ABS: 2.8 10*3/uL (ref 0.7–3.1)
LYMPHS: 41 %
MCH: 30.2 pg (ref 26.6–33.0)
MCHC: 34 g/dL (ref 31.5–35.7)
MCV: 89 fL (ref 79–97)
Monocytes Absolute: 0.4 10*3/uL (ref 0.1–0.9)
Monocytes: 6 %
NEUTROS ABS: 3.5 10*3/uL (ref 1.4–7.0)
Neutrophils: 52 %
Platelets: 205 10*3/uL (ref 150–379)
RBC: 4.9 x10E6/uL (ref 4.14–5.80)
RDW: 13.6 % (ref 12.3–15.4)
WBC: 6.9 10*3/uL (ref 3.4–10.8)

## 2016-10-06 LAB — LIPID PANEL
CHOL/HDL RATIO: 5.1 ratio — AB (ref 0.0–5.0)
Cholesterol, Total: 193 mg/dL (ref 100–199)
HDL: 38 mg/dL — ABNORMAL LOW (ref >39)
LDL CALC: 109 mg/dL — AB (ref 0–99)
Triglycerides: 231 mg/dL — ABNORMAL HIGH (ref 0–149)
VLDL CHOLESTEROL CAL: 46 mg/dL — AB (ref 5–40)

## 2016-10-06 LAB — COMPREHENSIVE METABOLIC PANEL
ALT: 12 IU/L (ref 0–44)
AST: 14 IU/L (ref 0–40)
Albumin/Globulin Ratio: 2.8 — ABNORMAL HIGH (ref 1.2–2.2)
Albumin: 5.1 g/dL (ref 3.5–5.5)
Alkaline Phosphatase: 63 IU/L (ref 39–117)
BUN / CREAT RATIO: 10 (ref 9–20)
BUN: 8 mg/dL (ref 6–20)
Bilirubin Total: 0.5 mg/dL (ref 0.0–1.2)
CO2: 24 mmol/L (ref 20–29)
CREATININE: 0.77 mg/dL (ref 0.76–1.27)
Calcium: 9.7 mg/dL (ref 8.7–10.2)
Chloride: 103 mmol/L (ref 96–106)
GFR, EST AFRICAN AMERICAN: 139 mL/min/{1.73_m2} (ref >59)
GFR, EST NON AFRICAN AMERICAN: 120 mL/min/{1.73_m2} (ref >59)
GLOBULIN, TOTAL: 1.8 g/dL (ref 1.5–4.5)
Glucose: 87 mg/dL (ref 65–99)
Potassium: 3.7 mmol/L (ref 3.5–5.2)
SODIUM: 142 mmol/L (ref 134–144)
TOTAL PROTEIN: 6.9 g/dL (ref 6.0–8.5)

## 2016-10-06 LAB — TSH: TSH: 0.964 u[IU]/mL (ref 0.450–4.500)

## 2016-10-12 ENCOUNTER — Encounter: Payer: Self-pay | Admitting: Family Medicine

## 2016-10-19 ENCOUNTER — Encounter: Payer: Self-pay | Admitting: Medical Oncology

## 2016-10-19 ENCOUNTER — Emergency Department
Admission: EM | Admit: 2016-10-19 | Discharge: 2016-10-19 | Disposition: A | Discharge status: Home / Self Care | Payer: BLUE CROSS/BLUE SHIELD | Attending: Emergency Medicine | Admitting: Emergency Medicine

## 2016-10-19 DIAGNOSIS — R0789 Other chest pain: Secondary | ICD-10-CM

## 2016-10-19 DIAGNOSIS — Z87891 Personal history of nicotine dependence: Secondary | ICD-10-CM | POA: Insufficient documentation

## 2016-10-19 DIAGNOSIS — Z79899 Other long term (current) drug therapy: Secondary | ICD-10-CM | POA: Insufficient documentation

## 2016-10-19 DIAGNOSIS — R079 Chest pain, unspecified: Secondary | ICD-10-CM | POA: Diagnosis present

## 2016-10-19 LAB — COMPREHENSIVE METABOLIC PANEL
ALBUMIN: 4.7 g/dL (ref 3.5–5.0)
ALK PHOS: 67 U/L (ref 38–126)
ALT: 27 U/L (ref 17–63)
AST: 29 U/L (ref 15–41)
Anion gap: 7 (ref 5–15)
BILIRUBIN TOTAL: 0.5 mg/dL (ref 0.3–1.2)
BUN: 7 mg/dL (ref 6–20)
CALCIUM: 9.7 mg/dL (ref 8.9–10.3)
CO2: 27 mmol/L (ref 22–32)
CREATININE: 0.75 mg/dL (ref 0.61–1.24)
Chloride: 108 mmol/L (ref 101–111)
GFR calc Af Amer: >60 mL/min (ref >60)
GFR calc non Af Amer: >60 mL/min (ref >60)
GLUCOSE: 78 mg/dL (ref 65–99)
Potassium: 3.7 mmol/L (ref 3.5–5.1)
Sodium: 142 mmol/L (ref 135–145)
Total Protein: 7 g/dL (ref 6.5–8.1)

## 2016-10-19 LAB — CBC
HEMATOCRIT: 42.8 % (ref 40.0–52.0)
HEMOGLOBIN: 15.1 g/dL (ref 13.0–18.0)
MCH: 30.9 pg (ref 26.0–34.0)
MCHC: 35.4 g/dL (ref 32.0–36.0)
MCV: 87.2 fL (ref 80.0–100.0)
Platelets: 175 10*3/uL (ref 150–440)
RBC: 4.9 MIL/uL (ref 4.40–5.90)
RDW: 13 % (ref 11.5–14.5)
WBC: 6.3 10*3/uL (ref 3.8–10.6)

## 2016-10-19 LAB — TROPONIN I: Troponin I: <0.03 ng/mL (ref <0.03)

## 2016-10-19 MED ORDER — GI COCKTAIL ~~LOC~~
30.0000 mL | Freq: Once | ORAL | Status: AC
  Administered 2016-10-19: 30 mL via ORAL
  Filled 2016-10-19: qty 30

## 2016-10-19 MED ORDER — PANTOPRAZOLE SODIUM 20 MG PO TBEC: 20.0000 mg | DELAYED_RELEASE_TABLET | Freq: Every day | ORAL | 1 refills | Status: DC

## 2016-10-19 NOTE — ED Triage Notes (Signed)
Pt reports indigestion x 1 week with burning sensation to throat. This am however pt reports he woke up to sharp stabbing pain to left side of chest.

## 2016-10-19 NOTE — ED Notes (Signed)
Pt presents with chest pain beginning at 0430. States it is intermittent sharp, stabbing pain in upper left chest. Pt also c/o burning epigastric pain "indigestion" x 1 week. Pt alert & oriented with NAD noted.

## 2016-10-19 NOTE — ED Notes (Signed)
Pt discharged home after verbalizing understanding of discharge instructions; nad noted. 

## 2016-10-19 NOTE — ED Provider Notes (Signed)
[facility] Emergency Department Provider Note   ____________________________________________    I have reviewed the triage vital signs and the nursing notes.   HISTORY  Chief Complaint Chest Pain     HPI Joel Carr is a 31 y.o. male who presents with complaints of chest pain. Patient reports burning discomfort in his central chest intermittently over the last 24 hours. He has a mild burning sensation currently. No shortness of breath. No recent travel. No calf pain or swelling. No nausea vomiting or diaphoresis. He has not taken anything for this. Nothing seems to make it better or worse. It does not radiate.   Past Medical History:  Diagnosis Date  . Bipolar disorder (HCC)   . Depression     Patient Active Problem List   Diagnosis Date Noted  . Hyperlipidemia 04/10/2015  . Bipolar disorder (HCC) 01/01/2015    Past Surgical History:  Procedure Laterality Date  . KNEE ARTHROSCOPY WITH MENISCAL REPAIR Right 10/29/2014   Procedure: KNEE ARTHROSCOPY WITH MENISCAL REPAIR;  Surgeon: Kennedy BuckerMichael Menz, MD;  Location: [facility] ORS;  Service: Orthopedics;  Laterality: Right;  . KNEE SURGERY Right   . TESTICLE SURGERY N/A    as a child    Prior to Admission medications   Medication Sig Start Date End Date Taking? Authorizing Provider  albuterol (PROVENTIL HFA;VENTOLIN HFA) 108 (90 Base) MCG/ACT inhaler Inhale 2 puffs into the lungs every 6 (six) hours as needed for wheezing or shortness of breath. 04/01/16  Yes Particia NearingLane, Rachel Elizabeth, PA-C  hydrOXYzine (VISTARIL) 50 MG capsule Take 50 mg by mouth 2 (two) times daily.    Yes [provider]  LamoTRIgine 250 MG TB24 Take 250 mg by mouth daily. 03/04/16  Yes [provider]  SAPHRIS 10 MG SUBL Take 10 mg by mouth daily. 03/04/16  Yes [provider]  pantoprazole (PROTONIX) 20 MG tablet Take 1 tablet (20 mg total) by mouth daily. 10/19/16 10/19/17  Jene EveryKinner, Amada Hallisey, MD      Allergies Patient has no known allergies.  Family History  Problem Relation Age of Onset  . Diabetes Paternal Grandfather   . Heart attack Paternal Grandfather     Social History Social History  Substance Use Topics  . Smoking status: Former Smoker    Packs/day: 0.00  . Smokeless tobacco: Never Used  . Alcohol use No    Review of Systems  Constitutional: No fever/chills Eyes: No visual changes.  ENT: No sore throat. Cardiovascular: As above Respiratory: Denies shortness of breath. Gastrointestinal: No abdominal pain.  No nausea, no vomiting.   Genitourinary: Negative for dysuria. Musculoskeletal: Negative for back pain. Skin: Negative for rash. Neurological: Negative for headaches or weakness   ____________________________________________   PHYSICAL EXAM:  VITAL SIGNS: ED Triage Vitals [10/19/16 1027]  Enc Vitals Group     BP 129/90     Pulse Rate 78     Resp 16     Temp 98 F (36.7 C)     Temp Source Oral     SpO2 99 %     Weight 75.8 kg (167 lb)     Height 1.727 m (5\' 8" )     Head Circumference      Peak Flow      Pain Score 4     Pain Loc      Pain Edu?      Excl. in GC?     Constitutional: Alert and oriented. No acute distress. Pleasant and interactive Eyes:  Conjunctivae are normal.   Nose: No congestion/rhinnorhea. Mouth/Throat: Mucous membranes are moist.    Cardiovascular: Normal rate, regular rhythm. Grossly normal heart sounds.  Good peripheral circulation. Respiratory: Normal respiratory effort.  No retractions. Lungs CTAB. Gastrointestinal: Soft and nontender. No CVA tenderness. Genitourinary: deferred Musculoskeletal: No lower extremity tenderness nor edema.  Warm and well perfused Neurologic:  Normal speech and language. No gross focal neurologic deficits are appreciated.  Skin:  Skin is warm, dry and intact. No rash noted. Psychiatric: Mood and affect are normal. Speech and behavior are  normal.  ____________________________________________   LABS (all labs ordered are listed, but only abnormal results are displayed)  Labs Reviewed  CBC  COMPREHENSIVE METABOLIC PANEL  TROPONIN I   ____________________________________________  EKG  ED ECG REPORT I, Jene Every, the attending physician, personally viewed and interpreted this ECG.  Date: 10/19/2016  Rhythm: normal sinus rhythm QRS Axis: normal Intervals: normal ST/T Wave abnormalities: normal Narrative Interpretation: unremarkable  ____________________________________________  RADIOLOGY  None ____________________________________________   PROCEDURES  Procedure(s) performed: No    Critical Care performed: No ____________________________________________   INITIAL IMPRESSION / ASSESSMENT AND PLAN / ED COURSE  Pertinent labs & imaging results that were available during my care of the patient were reviewed by me and considered in my medical decision making (see chart for details).  Patient presents with chest discomfort as described above, does not appear to be consistent with ACS, he is quite comfortable. Exam is normal. Labs are reassuring. EKG and troponin are normal. Patient had complete resolution of discomfort with GI cocktail. I will discharge the patient with Protonix outpatient follow-up. He is return if any change in his symptoms.    ____________________________________________   FINAL CLINICAL IMPRESSION(S) / ED DIAGNOSES  Final diagnoses:  Atypical chest pain      NEW MEDICATIONS STARTED DURING THIS VISIT:  Discharge Medication List as of 10/19/2016 11:58 AM    START taking these medications   Details  pantoprazole (PROTONIX) 20 MG tablet Take 1 tablet (20 mg total) by mouth daily., Starting Tue 10/19/2016, Until Wed 10/19/2017, Print         Note:  This document was prepared using Dragon voice recognition software and may include unintentional dictation errors.     Jene Every, MD 10/19/16 1500

## 2016-10-19 NOTE — ED Notes (Signed)
Pt reports that gi cocktail helped with burning epigastric pain but that he is still having the sharp pains in his upper left chest.

## 2016-12-16 ENCOUNTER — Encounter: Payer: Self-pay | Admitting: Family Medicine

## 2016-12-16 NOTE — Telephone Encounter (Signed)
Routing to provider  

## 2016-12-21 MED ORDER — GABAPENTIN 300 MG PO CAPS: 300.0000 mg | ORAL_CAPSULE | Freq: Two times a day (BID) | ORAL | 2 refills | Status: DC

## 2016-12-21 NOTE — Telephone Encounter (Signed)
Phone call Discussed with patient restless legs will try some gabapentin if not better patient will need an appointment.

## 2016-12-28 ENCOUNTER — Emergency Department
Admission: EM | Admit: 2016-12-28 | Discharge: 2016-12-28 | Disposition: A | Discharge status: Home / Self Care | Payer: BLUE CROSS/BLUE SHIELD | Attending: Emergency Medicine | Admitting: Emergency Medicine

## 2016-12-28 ENCOUNTER — Encounter: Payer: Self-pay | Admitting: Emergency Medicine

## 2016-12-28 DIAGNOSIS — Z87891 Personal history of nicotine dependence: Secondary | ICD-10-CM | POA: Insufficient documentation

## 2016-12-28 DIAGNOSIS — Y9389 Activity, other specified: Secondary | ICD-10-CM | POA: Insufficient documentation

## 2016-12-28 DIAGNOSIS — H5789 Other specified disorders of eye and adnexa: Secondary | ICD-10-CM | POA: Diagnosis present

## 2016-12-28 DIAGNOSIS — S0501XA Injury of conjunctiva and corneal abrasion without foreign body, right eye, initial encounter: Secondary | ICD-10-CM | POA: Insufficient documentation

## 2016-12-28 DIAGNOSIS — Y998 Other external cause status: Secondary | ICD-10-CM | POA: Diagnosis not present

## 2016-12-28 DIAGNOSIS — W60XXXA Contact with nonvenomous plant thorns and spines and sharp leaves, initial encounter: Secondary | ICD-10-CM | POA: Diagnosis not present

## 2016-12-28 DIAGNOSIS — Z79899 Other long term (current) drug therapy: Secondary | ICD-10-CM | POA: Insufficient documentation

## 2016-12-28 DIAGNOSIS — F319 Bipolar disorder, unspecified: Secondary | ICD-10-CM | POA: Diagnosis not present

## 2016-12-28 DIAGNOSIS — Y929 Unspecified place or not applicable: Secondary | ICD-10-CM | POA: Insufficient documentation

## 2016-12-28 MED ORDER — TETRACAINE HCL 0.5 % OP SOLN
1.0000 [drp] | Freq: Once | OPHTHALMIC | Status: AC
  Administered 2016-12-28: 1 [drp] via OPHTHALMIC
  Filled 2016-12-28: qty 4

## 2016-12-28 MED ORDER — DICLOFENAC SODIUM 0.1 % OP SOLN: 1.0000 [drp] | Freq: Four times a day (QID) | OPHTHALMIC | 0 refills | Status: AC

## 2016-12-28 MED ORDER — FLUORESCEIN SODIUM 1 MG OP STRP
1.0000 | ORAL_STRIP | Freq: Once | OPHTHALMIC | Status: AC
  Administered 2016-12-28: 1 via OPHTHALMIC
  Filled 2016-12-28: qty 1

## 2016-12-28 MED ORDER — POLYMYXIN B-TRIMETHOPRIM 10000-0.1 UNIT/ML-% OP SOLN: 1.0000 [drp] | OPHTHALMIC | 0 refills | Status: DC

## 2016-12-28 NOTE — ED Triage Notes (Signed)
Patient states that he was doing yard work yesterday and a bush hit him in his right eye. Patient continues to have pain and redness to his right eye.

## 2016-12-28 NOTE — ED Notes (Signed)
Pt states tree branch scratched  RT eye yesterday, redness noted. Pt c/o blurred vision

## 2016-12-28 NOTE — ED Provider Notes (Signed)
[facility] Emergency Department Provider Note  ____________________________________________  Time seen: Approximately 11:04 PM  I have reviewed the triage vital signs and the nursing notes.   HISTORY  Chief Complaint Eye Pain    HPI Joel Carr is a 31 y.o. male presenting to the emergency department with right eye conjunctivitis, increased tearing, foreign body sensation, photophobia and mild blurry vision since patient collided with a bush one day ago while doing yardwork. Patient does not wear contact lenses or glasses. Patient has difficulty keeping his eye open and denies pain with extraocular eye muscle movement. No alleviating measures have been attempted.No nausea or vomiting.   Past Medical History:  Diagnosis Date  . Bipolar disorder (HCC)   . Depression     Patient Active Problem List   Diagnosis Date Noted  . Hyperlipidemia 04/10/2015  . Bipolar disorder (HCC) 01/01/2015    Past Surgical History:  Procedure Laterality Date  . KNEE ARTHROSCOPY WITH MENISCAL REPAIR Right 10/29/2014   Procedure: KNEE ARTHROSCOPY WITH MENISCAL REPAIR;  Surgeon: Kennedy Bucker, MD;  Location: [facility] ORS;  Service: Orthopedics;  Laterality: Right;  . KNEE SURGERY Right   . TESTICLE SURGERY N/A    as a child    Prior to Admission medications   Medication Sig Start Date End Date Taking? Authorizing Provider  albuterol (PROVENTIL HFA;VENTOLIN HFA) 108 (90 Base) MCG/ACT inhaler Inhale 2 puffs into the lungs every 6 (six) hours as needed for wheezing or shortness of breath. 04/01/16   Particia Nearing, PA-C  diclofenac (VOLTAREN) 0.1 % ophthalmic solution Place 1 drop into the right eye 4 (four) times daily. 12/28/16 01/02/17  Orvil Feil, PA-C  gabapentin (NEURONTIN) 300 MG capsule Take 1 capsule (300 mg total) by mouth 2 (two) times daily. 12/21/16   Steele Sizer, MD  hydrOXYzine (VISTARIL) 50 MG capsule Take 50 mg by mouth 2 (two) times daily.      [provider]  LamoTRIgine 250 MG TB24 Take 250 mg by mouth daily. 03/04/16   [provider]  pantoprazole (PROTONIX) 20 MG tablet Take 1 tablet (20 mg total) by mouth daily. 10/19/16 10/19/17  Jene Every, MD  SAPHRIS 10 MG SUBL Take 10 mg by mouth daily. 03/04/16   [provider]  trimethoprim-polymyxin b (POLYTRIM) ophthalmic solution Place 1 drop into the right eye every 4 (four) hours. 12/28/16 01/04/17  Orvil Feil, PA-C    Allergies Patient has no known allergies.  Family History  Problem Relation Age of Onset  . Diabetes Paternal Grandfather   . Heart attack Paternal Grandfather     Social History Social History  Substance Use Topics  . Smoking status: Former Smoker    Packs/day: 0.00  . Smokeless tobacco: Never Used  . Alcohol use No     Review of Systems  Constitutional: No fever/chills Eyes: No visual changes. No discharge ENT: Patient has right eye conjunctivitis. Cardiovascular: no chest pain. Respiratory: no cough. No SOB. Gastrointestinal: No abdominal pain.  No nausea, no vomiting.  No diarrhea.  No constipation. Musculoskeletal: Negative for musculoskeletal pain. Skin: Negative for rash, abrasions, lacerations, ecchymosis. Neurological: Negative for headaches, focal weakness or numbness.   ____________________________________________   PHYSICAL EXAM:  VITAL SIGNS: ED Triage Vitals  Enc Vitals Group     BP 12/28/16 2039 123/86     Pulse Rate 12/28/16 2039 62     Resp 12/28/16 2039 18     Temp 12/28/16 2039 99.2 F (37.3 C)  Temp src --      SpO2 12/28/16 2039 99 %     Weight 12/28/16 2040 165 lb (74.8 kg)     Height 12/28/16 2040  (1.727 m)     Head Circumference --      Peak Flow --      Pain Score 12/28/16 2039 7     Pain Loc --      Pain Edu? --      Excl. in GC? --      Constitutional: Alert and oriented. Well appearing and in no acute distress. Eyes: Bulbar conjunctivae are erythematous,  right. Extraocular eye muscles intact. Pupils equal round and reactive to light bilaterally. Fluorescein staining revealed small region of uptake. Head: Atraumatic. ENT:      Ears: Tympanic membranes are pearly bilaterally.      Nose: No congestion/rhinnorhea.      Mouth/Throat: Mucous membranes are moist.  Cardiovascular: Normal rate, regular rhythm. Normal S1 and S2.  Good peripheral circulation. Respiratory: Normal respiratory effort without tachypnea or retractions. Lungs CTAB. Good air entry to the bases with no decreased or absent breath sounds.  Skin:  Skin is warm, dry and intact. No rash noted. Psychiatric: Mood and affect are normal. Speech and behavior are normal. Patient exhibits appropriate insight and judgement. ____________________________________________   LABS (all labs ordered are listed, but only abnormal results are displayed)  Labs Reviewed - No data to display ____________________________________________  EKG   ____________________________________________  RADIOLOGY  No results found.  ____________________________________________    PROCEDURES  Procedure(s) performed:    Procedures    Medications  tetracaine (PONTOCAINE) 0.5 % ophthalmic solution 1 drop (1 drop Right Eye Given 12/28/16 2202)  fluorescein ophthalmic strip 1 strip (1 strip Right Eye Given 12/28/16 2203)     ____________________________________________   INITIAL IMPRESSION / ASSESSMENT AND PLAN / ED COURSE  Pertinent labs & imaging results that were available during my care of the patient were reviewed by me and considered in my medical decision making (see chart for details).  Review of the [place] CSRS was performed in accordance of the [facility] prior to dispensing any controlled drugs.     Assessment and plan Corneal abrasion Patient presents to the emergency department with foreign body sensation, increased tearing and photophobia after a bush collided with his right eye one day  ago. A small region of uptake was visualized on fluorescein staining. History and physical exam findings are consistent with corneal abrasion. Patient was discharged with Polytrim ophthalmic solution and diclofenac ophthalmic drops. A referral was made to ophthalmology. All patient questions were answered.   ____________________________________________  FINAL CLINICAL IMPRESSION(S) / ED DIAGNOSES  Final diagnoses:  Abrasion of right cornea, initial encounter      NEW MEDICATIONS STARTED DURING THIS VISIT:  Discharge Medication List as of 12/28/2016  9:56 PM    START taking these medications   Details  diclofenac (VOLTAREN) 0.1 % ophthalmic solution Place 1 drop into the right eye 4 (four) times daily., Starting Tue 12/28/2016, Until Sun 01/02/2017, Print    trimethoprim-polymyxin b (POLYTRIM) ophthalmic solution Place 1 drop into the right eye every 4 (four) hours., Starting Tue 12/28/2016, Until Tue 01/04/2017, Print            This chart was dictated using voice recognition software/Dragon. Despite best efforts to proofread, errors can occur which can change the meaning. Any change was purely unintentional.    Orvil Feil, PA-C 12/28/16 2309    Minna Antis,  MD 12/28/16 2319

## 2016-12-31 ENCOUNTER — Encounter: Payer: Self-pay | Admitting: Emergency Medicine

## 2016-12-31 ENCOUNTER — Encounter: Payer: Self-pay | Admitting: Family Medicine

## 2016-12-31 ENCOUNTER — Emergency Department: Payer: BLUE CROSS/BLUE SHIELD

## 2016-12-31 ENCOUNTER — Emergency Department
Admission: EM | Admit: 2016-12-31 | Discharge: 2016-12-31 | Disposition: A | Discharge status: Home / Self Care | Payer: BLUE CROSS/BLUE SHIELD | Attending: Emergency Medicine | Admitting: Emergency Medicine

## 2016-12-31 DIAGNOSIS — Z87891 Personal history of nicotine dependence: Secondary | ICD-10-CM | POA: Diagnosis not present

## 2016-12-31 DIAGNOSIS — M7918 Myalgia, other site: Secondary | ICD-10-CM | POA: Insufficient documentation

## 2016-12-31 DIAGNOSIS — S0990XA Unspecified injury of head, initial encounter: Secondary | ICD-10-CM

## 2016-12-31 DIAGNOSIS — S161XXA Strain of muscle, fascia and tendon at neck level, initial encounter: Secondary | ICD-10-CM | POA: Insufficient documentation

## 2016-12-31 DIAGNOSIS — Y999 Unspecified external cause status: Secondary | ICD-10-CM | POA: Insufficient documentation

## 2016-12-31 DIAGNOSIS — R51 Headache: Secondary | ICD-10-CM | POA: Diagnosis not present

## 2016-12-31 DIAGNOSIS — Y939 Activity, unspecified: Secondary | ICD-10-CM | POA: Diagnosis not present

## 2016-12-31 DIAGNOSIS — Z79899 Other long term (current) drug therapy: Secondary | ICD-10-CM | POA: Insufficient documentation

## 2016-12-31 DIAGNOSIS — Y92410 Unspecified street and highway as the place of occurrence of the external cause: Secondary | ICD-10-CM | POA: Diagnosis not present

## 2016-12-31 MED ORDER — IBUPROFEN 600 MG PO TABS: 600.0000 mg | ORAL_TABLET | Freq: Three times a day (TID) | ORAL | 0 refills | Status: DC | PRN

## 2016-12-31 MED ORDER — IBUPROFEN 600 MG PO TABS
600.0000 mg | ORAL_TABLET | Freq: Once | ORAL | Status: AC
  Administered 2016-12-31: 600 mg via ORAL
  Filled 2016-12-31: qty 1

## 2016-12-31 MED ORDER — CYCLOBENZAPRINE HCL 10 MG PO TABS
10.0000 mg | ORAL_TABLET | Freq: Once | ORAL | Status: AC
  Administered 2016-12-31: 10 mg via ORAL
  Filled 2016-12-31: qty 1

## 2016-12-31 MED ORDER — TRAMADOL HCL 50 MG PO TABS: 50.0000 mg | ORAL_TABLET | Freq: Four times a day (QID) | ORAL | 0 refills | Status: DC | PRN

## 2016-12-31 MED ORDER — CYCLOBENZAPRINE HCL 10 MG PO TABS: 10.0000 mg | ORAL_TABLET | Freq: Three times a day (TID) | ORAL | 0 refills | Status: DC | PRN

## 2016-12-31 MED ORDER — TRAMADOL HCL 50 MG PO TABS
50.0000 mg | ORAL_TABLET | Freq: Once | ORAL | Status: AC
Start: 2016-12-31 — End: 2016-12-31
  Administered 2016-12-31: 50 mg via ORAL
  Filled 2016-12-31: qty 1
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
## 2017-01-01 ENCOUNTER — Encounter: Payer: Self-pay | Admitting: Emergency Medicine

## 2017-01-01 ENCOUNTER — Emergency Department
Admission: EM | Admit: 2017-01-01 | Discharge: 2017-01-01 | Disposition: A | Discharge status: Home / Self Care | Payer: BLUE CROSS/BLUE SHIELD | Attending: Emergency Medicine | Admitting: Emergency Medicine

## 2017-01-01 DIAGNOSIS — Z87891 Personal history of nicotine dependence: Secondary | ICD-10-CM | POA: Insufficient documentation

## 2017-01-01 DIAGNOSIS — S060X0D Concussion without loss of consciousness, subsequent encounter: Secondary | ICD-10-CM | POA: Diagnosis not present

## 2017-01-01 DIAGNOSIS — T50905A Adverse effect of unspecified drugs, medicaments and biological substances, initial encounter: Secondary | ICD-10-CM | POA: Diagnosis not present

## 2017-01-01 DIAGNOSIS — Z79899 Other long term (current) drug therapy: Secondary | ICD-10-CM | POA: Insufficient documentation

## 2017-01-01 DIAGNOSIS — R410 Disorientation, unspecified: Secondary | ICD-10-CM

## 2017-01-01 DIAGNOSIS — S060X0S Concussion without loss of consciousness, sequela: Secondary | ICD-10-CM

## 2017-01-01 NOTE — Discharge Instructions (Signed)
Please seek medical attention for any high fevers, chest pain, shortness of breath, change in behavior, persistent vomiting, bloody stool or any other new or concerning symptoms.  
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
## 2017-01-01 NOTE — ED Notes (Signed)
Patient A+Ox4, slow to respond

## 2017-01-03 ENCOUNTER — Encounter: Payer: Self-pay | Admitting: Family Medicine

## 2017-01-03 ENCOUNTER — Ambulatory Visit: Payer: BLUE CROSS/BLUE SHIELD | Admitting: Family Medicine

## 2017-01-03 ENCOUNTER — Ambulatory Visit (INDEPENDENT_AMBULATORY_CARE_PROVIDER_SITE_OTHER): Payer: BLUE CROSS/BLUE SHIELD | Admitting: Family Medicine

## 2017-01-03 VITALS — BP 123/73 | HR 68 | Temp 97.4°F | Ht 68.0 in | Wt 166.7 lb

## 2017-01-03 DIAGNOSIS — G44319 Acute post-traumatic headache, not intractable: Secondary | ICD-10-CM

## 2017-01-03 DIAGNOSIS — G2581 Restless legs syndrome: Secondary | ICD-10-CM

## 2017-01-03 MED ORDER — GABAPENTIN 300 MG PO CAPS: ORAL_CAPSULE | ORAL | 1 refills | Status: DC

## 2017-01-03 MED ORDER — PANTOPRAZOLE SODIUM 20 MG PO TBEC: 20.0000 mg | DELAYED_RELEASE_TABLET | Freq: Every day | ORAL | 1 refills | Status: DC
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
## 2017-01-03 NOTE — Patient Instructions (Signed)
8 months for CPE, 1 month for restless leg syndrome

## 2017-01-04 ENCOUNTER — Encounter: Payer: Self-pay | Admitting: Family Medicine

## 2017-01-04 ENCOUNTER — Other Ambulatory Visit: Payer: Self-pay | Admitting: Family Medicine

## 2017-01-04 ENCOUNTER — Telehealth: Payer: Self-pay | Admitting: Family Medicine

## 2017-01-04 DIAGNOSIS — F0781 Postconcussional syndrome: Secondary | ICD-10-CM

## 2017-01-04 MED ORDER — SUMATRIPTAN SUCCINATE 50 MG PO TABS: ORAL_TABLET | ORAL | 0 refills | Status: DC

## 2017-01-04 NOTE — Telephone Encounter (Signed)
Patient called to see if we had received his FMLA forms from [employer]. He said they were supposed to be faxed here yesterday.  Thank You  [phone]

## 2017-01-04 NOTE — Telephone Encounter (Signed)
Pt. Scheduled for fu 01/05/2017.

## 2017-01-04 NOTE — Telephone Encounter (Signed)
Paperwork received and given to [place]. Explained to patient we will work on paperwork and send either this afternoon or tomorrow morning.  Patient understood.

## 2017-01-05 ENCOUNTER — Emergency Department: Payer: BLUE CROSS/BLUE SHIELD

## 2017-01-05 ENCOUNTER — Encounter: Payer: Self-pay | Admitting: Family Medicine

## 2017-01-05 ENCOUNTER — Ambulatory Visit (INDEPENDENT_AMBULATORY_CARE_PROVIDER_SITE_OTHER): Payer: BLUE CROSS/BLUE SHIELD | Admitting: Family Medicine

## 2017-01-05 ENCOUNTER — Emergency Department
Admission: EM | Admit: 2017-01-05 | Discharge: 2017-01-05 | Disposition: A | Discharge status: Home / Self Care | Payer: BLUE CROSS/BLUE SHIELD | Attending: Emergency Medicine | Admitting: Emergency Medicine

## 2017-01-05 VITALS — BP 124/80 | HR 68 | Temp 97.8°F | Wt 169.0 lb

## 2017-01-05 DIAGNOSIS — Y939 Activity, unspecified: Secondary | ICD-10-CM | POA: Insufficient documentation

## 2017-01-05 DIAGNOSIS — Z79899 Other long term (current) drug therapy: Secondary | ICD-10-CM | POA: Insufficient documentation

## 2017-01-05 DIAGNOSIS — F17210 Nicotine dependence, cigarettes, uncomplicated: Secondary | ICD-10-CM | POA: Diagnosis not present

## 2017-01-05 DIAGNOSIS — F0781 Postconcussional syndrome: Secondary | ICD-10-CM | POA: Diagnosis not present

## 2017-01-05 DIAGNOSIS — Y999 Unspecified external cause status: Secondary | ICD-10-CM | POA: Insufficient documentation

## 2017-01-05 DIAGNOSIS — Y929 Unspecified place or not applicable: Secondary | ICD-10-CM | POA: Insufficient documentation

## 2017-01-05 DIAGNOSIS — S060X0A Concussion without loss of consciousness, initial encounter: Secondary | ICD-10-CM | POA: Diagnosis not present

## 2017-01-05 DIAGNOSIS — R51 Headache: Secondary | ICD-10-CM | POA: Diagnosis present

## 2017-01-05 MED ORDER — IBUPROFEN 600 MG PO TABS: 600.0000 mg | ORAL_TABLET | Freq: Three times a day (TID) | ORAL | 0 refills | Status: DC | PRN
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
## 2017-01-05 NOTE — Patient Instructions (Signed)
Follow up with ER for repeat scan

## 2017-01-05 NOTE — ED Notes (Signed)
Pt verbalized understanding of discharge instructions. NAD at this time. 
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
## 2017-01-06 ENCOUNTER — Encounter: Payer: Self-pay | Admitting: Family Medicine

## 2017-01-06 ENCOUNTER — Ambulatory Visit: Payer: BLUE CROSS/BLUE SHIELD | Admitting: Family Medicine

## 2017-01-06 ENCOUNTER — Ambulatory Visit: Payer: Self-pay | Admitting: Family Medicine

## 2017-01-06 ENCOUNTER — Other Ambulatory Visit: Payer: Self-pay | Admitting: Family Medicine

## 2017-01-06 MED ORDER — BACLOFEN 10 MG PO TABS: 10.0000 mg | ORAL_TABLET | Freq: Three times a day (TID) | ORAL | 0 refills | Status: DC

## 2017-01-31 ENCOUNTER — Ambulatory Visit: Payer: BLUE CROSS/BLUE SHIELD | Admitting: Family Medicine

## 2017-01-31 ENCOUNTER — Encounter: Payer: Self-pay | Admitting: Family Medicine

## 2017-01-31 DIAGNOSIS — G2581 Restless legs syndrome: Secondary | ICD-10-CM | POA: Diagnosis not present

## 2017-01-31 DIAGNOSIS — Z23 Encounter for immunization: Secondary | ICD-10-CM

## 2017-01-31 NOTE — Progress Notes (Signed)
   BP 129/76   Pulse 82   Temp 98.9 F (37.2 C)   Wt 166 lb (75.3 kg)   SpO2 98%   BMI 25.24 kg/m    Subjective:    Patient ID: Joel Carr, male    DOB: 08/16/1985, 31 y.o.   MRN: [id number]  HPI: Joel RalphBryan D Moncada is a 31 y.o. male  Chief Complaint  Patient presents with  . RLS    doing well with the Gabapentin/   Patient presents today for RLS follow up. Recently started on gabapentin and doing very well with it. Sleeping much better, minimal residual sxs. Taking 2 in the morning and either 2-3 at night, doing very well. Denies side effects.    Relevant past medical, surgical, family and social history reviewed and updated as indicated. Interim medical history since our last visit reviewed. Allergies and medications reviewed and updated.  Review of Systems  Constitutional: Negative.   Respiratory: Negative.   Cardiovascular: Negative.   Gastrointestinal: Negative.   Musculoskeletal: Negative.   Neurological: Negative.   Psychiatric/Behavioral: Negative.     Per HPI unless specifically indicated above     Objective:    BP 129/76   Pulse 82   Temp 98.9 F (37.2 C)   Wt 166 lb (75.3 kg)   SpO2 98%   BMI 25.24 kg/m   Wt Readings from Last 3 Encounters:  01/31/17 166 lb (75.3 kg)  01/05/17 169 lb (76.7 kg)  01/05/17 169 lb (76.7 kg)    Physical Exam  Constitutional: He is oriented to person, place, and time. He appears well-developed and well-nourished. No distress.  HENT:  Head: Atraumatic.  Eyes: Conjunctivae are normal. No scleral icterus.  Neck: Normal range of motion. Neck supple.  Cardiovascular: Normal rate and normal heart sounds.  Pulmonary/Chest: Effort normal and breath sounds normal. No respiratory distress.  Musculoskeletal: Normal range of motion.  Neurological: He is alert and oriented to person, place, and time. He exhibits normal muscle tone. Coordination normal.  Skin: Skin is warm and dry.  Psychiatric: He has a normal mood and affect. His  behavior is normal.  Nursing note and vitals reviewed.     Assessment & Plan:   Problem List Items Addressed This Visit      Other   RLS (restless legs syndrome)    Significant improvement in symptoms as well as sleep quality with gabapentin. Continue current regimen       Other Visit Diagnoses    Need for immunization against influenza       Relevant Orders   Flu Vaccine QUAD 36+ mos IM (Completed)       Follow up plan: Return in about 8 months (around 09/30/2017) for CPE.

## 2017-02-02 DIAGNOSIS — G2581 Restless legs syndrome: Secondary | ICD-10-CM | POA: Insufficient documentation

## 2017-02-02 NOTE — Patient Instructions (Signed)
Follow up as needed

## 2017-02-02 NOTE — Assessment & Plan Note (Signed)
Significant improvement in symptoms as well as sleep quality with gabapentin. Continue current regimen

## 2017-02-15 ENCOUNTER — Encounter: Payer: Self-pay | Admitting: Family Medicine

## 2017-02-22 ENCOUNTER — Encounter: Payer: Self-pay | Admitting: Family Medicine

## 2017-02-24 ENCOUNTER — Ambulatory Visit: Payer: BLUE CROSS/BLUE SHIELD | Admitting: Family Medicine

## 2017-02-28 ENCOUNTER — Encounter: Payer: Self-pay | Admitting: Family Medicine

## 2017-02-28 ENCOUNTER — Other Ambulatory Visit: Payer: Self-pay | Admitting: Family Medicine

## 2017-02-28 MED ORDER — PANTOPRAZOLE SODIUM 20 MG PO TBEC: 20.0000 mg | DELAYED_RELEASE_TABLET | Freq: Every day | ORAL | 6 refills | Status: DC

## 2017-03-01 ENCOUNTER — Telehealth: Payer: Self-pay | Admitting: Family Medicine

## 2017-03-01 NOTE — Telephone Encounter (Signed)
Faxed patients script for pantoprazole to [facility]

## 2017-03-08 ENCOUNTER — Other Ambulatory Visit: Payer: Self-pay

## 2017-03-08 NOTE — Telephone Encounter (Signed)
Refill request for Gabapentin 300mg

## 2017-03-09 MED ORDER — GABAPENTIN 300 MG PO CAPS: ORAL_CAPSULE | ORAL | 1 refills | Status: DC

## 2017-03-15 ENCOUNTER — Telehealth: Payer: Self-pay | Admitting: Neurology

## 2017-03-15 ENCOUNTER — Ambulatory Visit: Payer: Self-pay | Admitting: Neurology

## 2017-03-15 NOTE — Telephone Encounter (Signed)
This patient did not show for a new patient appointment today. 

## 2017-03-16 ENCOUNTER — Encounter: Payer: Self-pay | Admitting: Neurology

## 2017-03-25 ENCOUNTER — Encounter: Payer: Self-pay | Admitting: Family Medicine

## 2017-04-07 ENCOUNTER — Ambulatory Visit: Payer: BLUE CROSS/BLUE SHIELD | Admitting: Family Medicine

## 2017-04-11 ENCOUNTER — Ambulatory Visit: Payer: BLUE CROSS/BLUE SHIELD | Admitting: Family Medicine

## 2017-04-19 ENCOUNTER — Ambulatory Visit: Payer: BLUE CROSS/BLUE SHIELD | Admitting: Family Medicine

## 2017-04-25 ENCOUNTER — Encounter: Payer: Self-pay | Admitting: Family Medicine

## 2017-04-26 ENCOUNTER — Ambulatory Visit: Payer: BLUE CROSS/BLUE SHIELD | Admitting: Family Medicine

## 2017-05-09 ENCOUNTER — Encounter: Payer: Self-pay | Admitting: Family Medicine

## 2017-05-09 ENCOUNTER — Other Ambulatory Visit: Payer: Self-pay | Admitting: Family Medicine

## 2017-05-09 MED ORDER — GABAPENTIN 300 MG PO CAPS: ORAL_CAPSULE | ORAL | 1 refills | Status: DC

## 2017-05-28 ENCOUNTER — Encounter: Payer: Self-pay | Admitting: Family Medicine

## 2017-05-30 ENCOUNTER — Encounter: Payer: Self-pay | Admitting: Family Medicine

## 2017-10-04 ENCOUNTER — Encounter: Payer: Self-pay | Admitting: Family Medicine

## 2017-10-10 ENCOUNTER — Encounter: Payer: Self-pay | Admitting: Family Medicine

## 2017-10-10 ENCOUNTER — Ambulatory Visit (INDEPENDENT_AMBULATORY_CARE_PROVIDER_SITE_OTHER): Payer: BLUE CROSS/BLUE SHIELD | Admitting: Family Medicine

## 2017-10-10 VITALS — BP 118/77 | HR 69 | Ht 67.72 in | Wt 166.0 lb

## 2017-10-10 DIAGNOSIS — G2581 Restless legs syndrome: Secondary | ICD-10-CM | POA: Diagnosis not present

## 2017-10-10 DIAGNOSIS — Z0000 Encounter for general adult medical examination without abnormal findings: Secondary | ICD-10-CM

## 2017-10-10 DIAGNOSIS — F3131 Bipolar disorder, current episode depressed, mild: Secondary | ICD-10-CM | POA: Diagnosis not present

## 2017-10-10 DIAGNOSIS — E7800 Pure hypercholesterolemia, unspecified: Secondary | ICD-10-CM | POA: Diagnosis not present

## 2017-10-10 DIAGNOSIS — M7520 Bicipital tendinitis, unspecified shoulder: Secondary | ICD-10-CM | POA: Insufficient documentation

## 2017-10-10 DIAGNOSIS — M7540 Impingement syndrome of unspecified shoulder: Secondary | ICD-10-CM | POA: Insufficient documentation

## 2017-10-10 MED ORDER — PANTOPRAZOLE SODIUM 20 MG PO TBEC: 20.0000 mg | DELAYED_RELEASE_TABLET | Freq: Every day | ORAL | 4 refills | Status: DC

## 2017-10-10 MED ORDER — SUMATRIPTAN SUCCINATE 100 MG PO TABS: ORAL_TABLET | ORAL | 10 refills | Status: DC

## 2017-10-10 MED ORDER — GABAPENTIN 300 MG PO CAPS: 600.0000 mg | ORAL_CAPSULE | Freq: Two times a day (BID) | ORAL | 4 refills | Status: DC

## 2017-10-10 NOTE — Assessment & Plan Note (Signed)
Diet control

## 2017-10-10 NOTE — Progress Notes (Signed)
BP 118/77   Pulse 69   Ht 5' 7.72" (1.72 m)   Wt 166 lb (75.3 kg)   SpO2 98%   BMI 25.45 kg/m    Subjective:    Patient ID: Joel Carr, male    DOB: 03/18/1986, 32 y.o.   MRN: [id number]  HPI: Joel RalphBryan D Ferner is a 32 y.o. male  Chief Complaint  Patient presents with  . Annual Exam  . Migraine    Pt having about 9 h/a days a month. Sensitive to light and noise. Sleep resolves most times.   Headaches also pounding resolved with sleep has really been ongoing just this month. Having a lot of stress with limited work looking at a new job and is hopeful will be getting new job soon. Dr. Maryruth BunKapur managing nerve medication which is working well. For restless legs taking gabapentin very successfully taken to 300 mg tablets each morning and the same in the evening which controls his restless legs very nicely. Reflux well controlled on Protonix   Relevant past medical, surgical, family and social history reviewed and updated as indicated. Interim medical history since our last visit reviewed. Allergies and medications reviewed and updated.  Review of Systems  Constitutional: Negative.   HENT: Negative.   Eyes: Negative.   Respiratory: Negative.   Cardiovascular: Negative.   Gastrointestinal: Negative.   Endocrine: Negative.   Genitourinary: Negative.   Musculoskeletal: Negative.   Skin: Negative.   Allergic/Immunologic: Negative.   Neurological: Negative.   Hematological: Negative.   Psychiatric/Behavioral: Negative.     Per HPI unless specifically indicated above     Objective:    BP 118/77   Pulse 69   Ht 5' 7.72" (1.72 m)   Wt 166 lb (75.3 kg)   SpO2 98%   BMI 25.45 kg/m   Wt Readings from Last 3 Encounters:  10/10/17 166 lb (75.3 kg)  01/31/17 166 lb (75.3 kg)  01/05/17 169 lb (76.7 kg)    Physical Exam  Constitutional: He is oriented to person, place, and time. He appears well-developed and well-nourished.  HENT:  Head: Normocephalic.  Right Ear: External  ear normal.  Left Ear: External ear normal.  Nose: Nose normal.  Eyes: Pupils are equal, round, and reactive to light. Conjunctivae and EOM are normal.  Neck: Normal range of motion. Neck supple. No thyromegaly present.  Cardiovascular: Normal rate, regular rhythm, normal heart sounds and intact distal pulses.  Pulmonary/Chest: Effort normal and breath sounds normal.  Abdominal: Soft. Bowel sounds are normal. There is no splenomegaly or hepatomegaly.  Genitourinary: Penis normal.  Musculoskeletal: Normal range of motion.  Lymphadenopathy:    He has no cervical adenopathy.  Neurological: He is alert and oriented to person, place, and time. He has normal reflexes.  Skin: Skin is warm and dry.  Psychiatric: He has a normal mood and affect. His behavior is normal. Judgment and thought content normal.    Results for orders placed or performed during the hospital encounter of 10/19/16  CBC  Result Value Ref Range   WBC 6.3 3.8 - 10.6 K/uL   RBC 4.90 4.40 - 5.90 MIL/uL   Hemoglobin 15.1 13.0 - 18.0 g/dL   HCT 21.342.8 08.640.0 - 57.852.0 %   MCV 87.2 80.0 - 100.0 fL   MCH 30.9 26.0 - 34.0 pg   MCHC 35.4 32.0 - 36.0 g/dL   RDW 46.913.0 62.911.5 - 52.814.5 %   Platelets 175 150 - 440 K/uL  Comprehensive metabolic panel  Result  Value Ref Range   Sodium 142 135 - 145 mmol/L   Potassium 3.7 3.5 - 5.1 mmol/L   Chloride 108 101 - 111 mmol/L   CO2 27 22 - 32 mmol/L   Glucose, Bld 78 65 - 99 mg/dL   BUN 7 6 - 20 mg/dL   Creatinine, Ser 1.61 0.61 - 1.24 mg/dL   Calcium 9.7 8.9 - 09.6 mg/dL   Total Protein 7.0 6.5 - 8.1 g/dL   Albumin 4.7 3.5 - 5.0 g/dL   AST 29 15 - 41 U/L   ALT 27 17 - 63 U/L   Alkaline Phosphatase 67 38 - 126 U/L   Total Bilirubin 0.5 0.3 - 1.2 mg/dL   GFR calc non Af Amer >60 >60 mL/min   GFR calc Af Amer >60 >60 mL/min   Anion gap 7 5 - 15  Troponin I  Result Value Ref Range   Troponin I <0.03 <0.03 ng/mL      Assessment & Plan:   Problem List Items Addressed This Visit       Other   Bipolar disorder (HCC)    Stable followed by Dr. Maryruth Bun      Hyperlipidemia    Diet control      Relevant Orders   CBC with Differential/Platelet   Comprehensive metabolic panel   Lipid panel   TSH   Urinalysis, Routine w reflex microscopic   RLS (restless legs syndrome)    The current medical regimen is effective;  continue present plan and medications.       Relevant Medications   gabapentin (NEURONTIN) 300 MG capsule    Other Visit Diagnoses    PE (physical exam), annual    -  Primary       Follow up plan: Return in about 1 year (around 10/11/2018) for Physical Exam.

## 2017-10-10 NOTE — Assessment & Plan Note (Signed)
Stable followed by Dr. Maryruth BunKapur

## 2017-10-10 NOTE — Assessment & Plan Note (Signed)
The current medical regimen is effective;  continue present plan and medications.  

## 2017-12-07 ENCOUNTER — Emergency Department
Admission: EM | Admit: 2017-12-07 | Discharge: 2017-12-07 | Disposition: A | Discharge status: Home / Self Care | Payer: 59 | Attending: Emergency Medicine | Admitting: Emergency Medicine

## 2017-12-07 ENCOUNTER — Encounter: Payer: Self-pay | Admitting: Emergency Medicine

## 2017-12-07 ENCOUNTER — Other Ambulatory Visit: Payer: Self-pay

## 2017-12-07 DIAGNOSIS — R05 Cough: Secondary | ICD-10-CM | POA: Diagnosis present

## 2017-12-07 DIAGNOSIS — Z79899 Other long term (current) drug therapy: Secondary | ICD-10-CM | POA: Diagnosis not present

## 2017-12-07 DIAGNOSIS — J209 Acute bronchitis, unspecified: Secondary | ICD-10-CM | POA: Diagnosis not present

## 2017-12-07 DIAGNOSIS — F17210 Nicotine dependence, cigarettes, uncomplicated: Secondary | ICD-10-CM

## 2017-12-07 MED ORDER — PSEUDOEPH-BROMPHEN-DM 30-2-10 MG/5ML PO SYRP: 5.0000 mL | ORAL_SOLUTION | Freq: Four times a day (QID) | ORAL | 0 refills | Status: DC | PRN

## 2017-12-07 MED ORDER — IPRATROPIUM-ALBUTEROL 0.5-2.5 (3) MG/3ML IN SOLN
3.0000 mL | Freq: Once | RESPIRATORY_TRACT | Status: AC
  Administered 2017-12-07: 3 mL via RESPIRATORY_TRACT
  Filled 2017-12-07: qty 3

## 2017-12-07 MED ORDER — PREDNISONE 10 MG PO TABS: ORAL_TABLET | ORAL | 0 refills | Status: DC

## 2017-12-07 MED ORDER — ALBUTEROL SULFATE HFA 108 (90 BASE) MCG/ACT IN AERS: 2.0000 | INHALATION_SPRAY | Freq: Four times a day (QID) | RESPIRATORY_TRACT | 2 refills | Status: DC | PRN

## 2017-12-07 NOTE — ED Triage Notes (Signed)
Patient complaining of body aches, productive brown phlegm producing cough.  Occasional dizziness.  Taking OTC cold medication.  Sx began 4 days ago, exposed to similar illnesses at work.  Daughter had sore throat last week.

## 2017-12-07 NOTE — ED Notes (Signed)
First Nurse Note: Patient complaining of cough and body aches.  Taking OTC cold medicine.

## 2017-12-07 NOTE — Discharge Instructions (Signed)
Follow-up with your primary care provider if any continued problems.  Increase fluids.  Decrease smoking.  Begin taking prednisone as directed tapering down over the next 6 days.  Albuterol 2 puffs every 6 hours as needed for wheezing, shortness of breath, or persistent coughing.  Bromfed-DM as needed for cough and congestion.

## 2017-12-07 NOTE — ED Provider Notes (Signed)
[facility] Emergency Department Provider Note  ____________________________________________   First MD Initiated Contact with Patient 12/07/17 1002     (approximate)  I have reviewed the triage vital signs and the nursing notes.   HISTORY  Chief Complaint Generalized Body Aches; Cough; and Dizziness  HPI Joel Carr is a 32 y.o. male presents to the emergency department with complaint of productive cough, body aches, congestion and sore throat.  Patient states he has had symptoms for last 4 days.  He is unaware of any fever.  He states that his daughter had a sore throat last week with similar symptoms which cleared after several days.  Patient is a smoker and states that in childhood there was some history of asthma.  He has been taking over-the-counter cough medication without any relief.  He denies any nausea, vomiting or diarrhea.  He rates his pain as 5/10.  Past Medical History:  Diagnosis Date  . Bipolar disorder (HCC)   . Depression     Patient Active Problem List   Diagnosis Date Noted  . Impingement syndrome of shoulder region 10/10/2017  . RLS (restless legs syndrome) 02/02/2017  . Hyperlipidemia 04/10/2015  . Bipolar disorder (HCC) 01/01/2015    Past Surgical History:  Procedure Laterality Date  . KNEE ARTHROSCOPY WITH MENISCAL REPAIR Right 10/29/2014   Procedure: KNEE ARTHROSCOPY WITH MENISCAL REPAIR;  Surgeon: Kennedy Bucker, MD;  Location: [facility] ORS;  Service: Orthopedics;  Laterality: Right;  . KNEE SURGERY Right   . TESTICLE SURGERY N/A    as a child    Prior to Admission medications   Medication Sig Start Date End Date Taking? Authorizing Provider  albuterol (PROVENTIL HFA;VENTOLIN HFA) 108 (90 Base) MCG/ACT inhaler Inhale 2 puffs into the lungs every 6 (six) hours as needed for wheezing or shortness of breath. 12/07/17   Tommi Rumps, PA-C  brompheniramine-pseudoephedrine-DM 30-2-10 MG/5ML syrup Take 5 mLs by mouth 4 (four)  times daily as needed. 12/07/17   Tommi Rumps, PA-C  busPIRone (BUSPAR) 10 MG tablet Take 10 mg by mouth 3 (three) times daily.    [provider]  clonazePAM (KLONOPIN) 0.5 MG tablet Klonopin 0.5 mg tablet  Take 1 tablet 3 times a day by oral route.    [provider]  gabapentin (NEURONTIN) 300 MG capsule Take 2 capsules (600 mg total) by mouth 2 (two) times daily. 10/10/17   Steele Sizer, MD  hydrOXYzine (VISTARIL) 50 MG capsule Take 50 mg by mouth 2 (two) times daily.     [provider]  ibuprofen (ADVIL,MOTRIN) 600 MG tablet Take 1 tablet (600 mg total) by mouth every 8 (eight) hours as needed. 01/05/17   Particia Nearing, PA-C  lamoTRIgine (LAMICTAL) 100 MG tablet TAKE 2 TABLETS BY MOUTH DAILY AND 1 TABLET BY MOUTH AT BEDTIME 08/29/17   [provider]  LamoTRIgine 250 MG TB24 Take 250 mg by mouth daily. 03/04/16   [provider]  pantoprazole (PROTONIX) 20 MG tablet Take 1 tablet (20 mg total) by mouth daily. 10/10/17 10/10/18  Steele Sizer, MD  predniSONE (DELTASONE) 10 MG tablet Take 6 tablets  today, on day 2 take 5 tablets, day 3 take 4 tablets, day 4 take 3 tablets, day 5 take  2 tablets and 1 tablet the last day 12/07/17   Tommi Rumps, PA-C  SAPHRIS 10 MG SUBL Take 10 mg by mouth daily. 03/04/16   [provider]  SUMAtriptan (IMITREX) 100 MG  tablet Take 1 tablet at first sign of headache. May repeat in 2 hours if headache persists or recurs. 10/10/17   Steele Sizer, MD  traZODone (DESYREL) 50 MG tablet  07/18/17   [provider]    Allergies Tramadol  Family History  Problem Relation Age of Onset  . Diabetes Paternal Grandfather   . Heart attack Paternal Grandfather     Social History Social History   Tobacco Use  . Smoking status: Current Every Day Smoker    Packs/day: 1.00    Types: Cigarettes  . Smokeless tobacco: Never Used  Substance Use Topics  . Alcohol use: No  . Drug use: No      Review of Systems Constitutional: No fever/chills Eyes: No visual changes. ENT: Positive sore throat and nasal congestion. Cardiovascular: Denies chest pain. Respiratory: Denies shortness of breath.  Positive productive cough. Gastrointestinal: No abdominal pain.  No nausea, no vomiting.  No diarrhea.   Musculoskeletal: Positive for body aches. Skin: Negative for rash. Neurological: Negative for headaches, focal weakness or numbness. ___________________________________________   PHYSICAL EXAM:  VITAL SIGNS: ED Triage Vitals  Enc Vitals Group     BP 12/07/17 0950 125/81     Pulse Rate 12/07/17 0950 63     Resp 12/07/17 0950 20     Temp 12/07/17 0950 97.7 F (36.5 C)     Temp Source 12/07/17 0950 Oral     SpO2 12/07/17 0950 100 %     Weight 12/07/17 0951 155 lb (70.3 kg)     Height 12/07/17 0951 5\' 8"  (1.727 m)     Head Circumference --      Peak Flow --      Pain Score 12/07/17 0951 5     Pain Loc --      Pain Edu? --      Excl. in GC? --    Constitutional: Alert and oriented. Well appearing and in no acute distress. Eyes: Conjunctivae are normal. PERRL. EOMI. Head: Atraumatic. Nose: Mild congestion/rhinnorhea.  EACs are clear.  TMs are dull bilaterally but no erythema or injection seen. Mouth/Throat: Mucous membranes are moist.  Oropharynx non-erythematous. Neck: No stridor.   Hematological/Lymphatic/Immunilogical: No cervical lymphadenopathy. Cardiovascular: Normal rate, regular rhythm. Grossly normal heart sounds.  Good peripheral circulation. Respiratory: Normal respiratory effort.  No retractions. Lungs no wheezes are noted.  There is a coarse cough which is bronchitic in nature. Musculoskeletal: Moves upper and lower extremities with any difficulty.  Normal gait was noted. Neurologic:  Normal speech and language. No gross focal neurologic deficits are appreciated. No gait instability. Skin:  Skin is warm, dry and intact. No rash noted. Psychiatric: Mood and  affect are normal. Speech and behavior are normal.  ____________________________________________   LABS (all labs ordered are listed, but only abnormal results are displayed)  Labs Reviewed - No data to display   PROCEDURES  Procedure(s) performed: None  Procedures  Critical Care performed: No  ____________________________________________   INITIAL IMPRESSION / ASSESSMENT AND PLAN / ED COURSE  As part of my medical decision making, I reviewed the following data within the electronic MEDICAL RECORD NUMBER Notes from prior ED visits and  Controlled Substance Database  Patient presents to the emergency department with complaint of productive cough and body aches for the last 4 days.  Patient is a smoker and has been taking over-the-counter cold medication without any relief.  Patient was given a DuoNeb treatment while in the department which decreased the amount of coughing and  he was able to move increased air.  Patient exam and history is consistent with bronchitis.  He is encouraged to do his continue or decrease smoking.  He was given a prescription for prednisone tapering dose along with Bromfed-DM and albuterol 2 puffs every 6 hours as needed for cough or wheezing.  He is to follow-up with Dr. Christell Faith office if any continued problems. ____________________________________________   FINAL CLINICAL IMPRESSION(S) / ED DIAGNOSES  Final diagnoses:  Acute bronchitis, unspecified organism  Cigarette smoker     ED Discharge Orders         Ordered    albuterol (PROVENTIL HFA;VENTOLIN HFA) 108 (90 Base) MCG/ACT inhaler  Every 6 hours PRN     12/07/17 1052    predniSONE (DELTASONE) 10 MG tablet     12/07/17 1052    brompheniramine-pseudoephedrine-DM 30-2-10 MG/5ML syrup  4 times daily PRN     12/07/17 1052           Note:  This document was prepared using Dragon voice recognition software and may include unintentional dictation errors.    Tommi Rumps, PA-C 12/07/17  1210    Emily Filbert, MD 12/07/17 [phone]

## 2017-12-13 ENCOUNTER — Encounter: Payer: Self-pay | Admitting: Family Medicine

## 2017-12-21 ENCOUNTER — Ambulatory Visit: Payer: Self-pay | Admitting: Family Medicine

## 2017-12-21 ENCOUNTER — Encounter: Payer: Self-pay | Admitting: Family Medicine

## 2017-12-21 ENCOUNTER — Ambulatory Visit (INDEPENDENT_AMBULATORY_CARE_PROVIDER_SITE_OTHER): Payer: 59 | Admitting: Physician Assistant

## 2017-12-21 ENCOUNTER — Encounter: Payer: Self-pay | Admitting: Physician Assistant

## 2017-12-21 VITALS — BP 124/78 | HR 76 | Temp 98.7°F | Ht 67.5 in | Wt 143.0 lb

## 2017-12-21 DIAGNOSIS — R6883 Chills (without fever): Secondary | ICD-10-CM

## 2017-12-21 DIAGNOSIS — J0110 Acute frontal sinusitis, unspecified: Secondary | ICD-10-CM

## 2017-12-21 LAB — VERITOR FLU A/B WAIVED
Influenza A: NEGATIVE
Influenza B: NEGATIVE

## 2017-12-21 MED ORDER — DOXYCYCLINE HYCLATE 100 MG PO TABS: 100.0000 mg | ORAL_TABLET | Freq: Two times a day (BID) | ORAL | 0 refills | Status: DC

## 2017-12-21 NOTE — Telephone Encounter (Signed)
He called in c/o having a congested cough with yellow mucus coming up with fever 100.9.  C/o chest hurting when he coughs.  Nose is running, throat very sore.  He was seen at the urgent care center 2 weeks ago for similar symptoms.   He was given cough medicine, an inhaler, and prednisone for 6 days.   He got a little better but now he is getting worse.  I scheduled him with Osvaldo Angst, PA-C for today at 1:00.   Reason for Disposition . [1] Fever returns after gone for over 24 hours AND [2] symptoms worse or not improved  Answer Assessment - Initial Assessment Questions 1. ONSET: "When did the cough begin?"      Yellow mucus coming up.   I was seen in the ED 2 weeks ago for this.   I had bronchitis.   2. SEVERITY: "How bad is the cough today?"      I got a little better for a day or two and now I'm feeling worse.   They gave me cough medicine and an inhaler I'm using and prednisone for 6 days.  The inhaler is not helping.    The cough is bad but I'm at work.   I have a fever of 100.9 this morning.   I using Tylenol.   I can't afford to miss work.  I want to do 1/2 a day. 3. RESPIRATORY DISTRESS: "Describe your breathing."      Short of breath. 4. FEVER: "Do you have a fever?" If so, ask: "What is your temperature, how was it measured, and when did it start?"     100.9 this morning. 5. HEMOPTYSIS: "Are you coughing up any blood?" If so ask: "How much?" (flecks, streaks, tablespoons, etc.)     Just a very little bit of blood. 6. TREATMENT: "What have you done so far to treat the cough?" (e.g., meds, fluids, humidifier)     Used cough medicine, inhaler and prednisone from urgent care visit 2 weeks ago. 7. CARDIAC HISTORY: "Do you have any history of heart disease?" (e.g., heart attack, congestive heart failure)      No 8. LUNG HISTORY: "Do you have any history of lung disease?"  (e.g., pulmonary embolus, asthma, emphysema)     Smoke but not of the other factors. 9. PE RISK FACTORS: "Do  you have a history of blood clots?" (or: recent major surgery, recent prolonged travel, bedridden)     No 10. OTHER SYMPTOMS: "Do you have any other symptoms? (e.g., runny nose, wheezing, chest pain)       Runny nose, chest pain with coughing, throat real sore. 11. PREGNANCY: "Is there any chance you are pregnant?" "When was your last menstrual period?"       N/A 12. TRAVEL: "Have you traveled out of the country in the last month?" (e.g., travel history, exposures)       No  Protocols used: COUGH - ACUTE NON-PRODUCTIVE-A-AH

## 2017-12-21 NOTE — Telephone Encounter (Signed)
See message. Just fyi

## 2017-12-21 NOTE — Patient Instructions (Signed)

## 2017-12-21 NOTE — Progress Notes (Signed)
   Subjective:    Patient ID: Joel Carr, male    DOB: 05/11/1985, 32 y.o.   MRN: [id number]  Joel Carr is a 32 y.o. male presenting on 12/21/2017 for URI (pt states he has had congestion, cough, sinus pressure, and headaches for the past 2 weeks. States the prednisone from the hospital did not help very much. States he started having chills and a fever 2 days ago)   HPI   Has a 15 pack year smoking history, presenting today for upper respiratory symptoms. He reports being seen in the ER 12/07/2017 with cough and was treated for bronchitis with prednisone and inhaler. Felt like he was getting better and then getting worse again. Reports feeling fevers, chilly, coughing up phlegm and facial pain/congestion.   Social History   Tobacco Use  . Smoking status: Current Every Day Smoker    Packs/day: 1.00    Types: Cigarettes  . Smokeless tobacco: Never Used  Substance Use Topics  . Alcohol use: No  . Drug use: No    Review of Systems Per HPI unless specifically indicated above     Objective:    BP 124/78   Pulse 76   Temp 98.7 F (37.1 C) (Oral)   Ht 5' 7.5" (1.715 m)   Wt 143 lb (64.9 kg)   SpO2 98%   BMI 22.07 kg/m   Wt Readings from Last 3 Encounters:  12/21/17 143 lb (64.9 kg)  12/07/17 155 lb (70.3 kg)  10/10/17 166 lb (75.3 kg)    Physical Exam  Constitutional: He is oriented to person, place, and time. He appears well-developed and well-nourished. No distress.  HENT:  Right Ear: External ear normal.  Left Ear: External ear normal.  Nose: Right sinus exhibits maxillary sinus tenderness and frontal sinus tenderness. Left sinus exhibits maxillary sinus tenderness and frontal sinus tenderness.  Mouth/Throat: Oropharynx is clear and moist and mucous membranes are normal. No oropharyngeal exudate, posterior oropharyngeal edema, posterior oropharyngeal erythema or tonsillar abscesses.  Eyes: Conjunctivae are normal.  Neck: Normal range of motion.  Cardiovascular:  Normal rate and regular rhythm.  Pulmonary/Chest: Effort normal and breath sounds normal.  Neurological: He is alert and oriented to person, place, and time.  Skin: Skin is warm and dry. He is not diaphoretic.  Psychiatric: He has a normal mood and affect. His behavior is normal.   Results for orders placed or performed in visit on 12/21/17  Veritor Flu A/B Waived  Result Value Ref Range   Influenza A Negative Negative   Influenza B Negative Negative      Assessment & Plan:  1. Acute non-recurrent frontal sinusitis   - doxycycline (VIBRA-TABS) 100 MG tablet; Take 1 tablet (100 mg total) by mouth 2 (two) times daily.  Dispense: 20 tablet; Refill: 0  2. Chills  Flu swab negative.   - Veritor Flu A/B Waived   Follow up plan: Return if symptoms worsen or fail to improve.  Osvaldo Angst, PA-C [facility] 12/21/2017, 3:45 PM

## 2017-12-27 ENCOUNTER — Encounter: Payer: Self-pay | Admitting: Family Medicine

## 2017-12-28 ENCOUNTER — Encounter: Payer: Self-pay | Admitting: Family Medicine

## 2018-01-05 ENCOUNTER — Encounter: Payer: Self-pay | Admitting: Family Medicine

## 2018-01-05 ENCOUNTER — Ambulatory Visit (INDEPENDENT_AMBULATORY_CARE_PROVIDER_SITE_OTHER): Payer: 59 | Admitting: Family Medicine

## 2018-01-05 ENCOUNTER — Other Ambulatory Visit: Payer: Self-pay

## 2018-01-05 VITALS — BP 113/71 | HR 64 | Temp 98.3°F | Ht 68.0 in | Wt 143.4 lb

## 2018-01-05 DIAGNOSIS — T148XXA Other injury of unspecified body region, initial encounter: Secondary | ICD-10-CM | POA: Diagnosis not present

## 2018-01-05 DIAGNOSIS — F3131 Bipolar disorder, current episode depressed, mild: Secondary | ICD-10-CM | POA: Diagnosis not present

## 2018-01-05 MED ORDER — LAMOTRIGINE 100 MG PO TABS: 100.0000 mg | ORAL_TABLET | Freq: Two times a day (BID) | ORAL | 3 refills | Status: DC

## 2018-01-05 MED ORDER — BUSPIRONE HCL 10 MG PO TABS: 10.0000 mg | ORAL_TABLET | Freq: Three times a day (TID) | ORAL | 2 refills | Status: DC

## 2018-01-05 MED ORDER — CYCLOBENZAPRINE HCL 5 MG PO TABS: 5.0000 mg | ORAL_TABLET | Freq: Every evening | ORAL | 0 refills | Status: DC | PRN

## 2018-01-05 MED ORDER — LURASIDONE HCL 20 MG PO TABS: 20.0000 mg | ORAL_TABLET | Freq: Every day | ORAL | 1 refills | Status: DC

## 2018-01-05 NOTE — Progress Notes (Signed)
BP 113/71   Pulse 64   Temp 98.3 F (36.8 C) (Oral)   Ht 5\' 8"  (1.727 m)   Wt 143 lb 6.4 oz (65 kg)   SpO2 98%   BMI 21.80 kg/m    Subjective:    Patient ID: Joel Carr, male    DOB: 05/01/1985, 32 y.o.   MRN: [id number]  HPI: Joel Carr is a 32 y.o. male  Chief Complaint  Patient presents with  . Anxiety    buspar and lamictal refills  . Manic Behavior    pt would like to discuss about saphris    Here today for bipolar f/u. Was being followed by Psychiatry but no longer can due to his insurance not being accepted. Does not feel his moods are under great control. Currently on saphris, lamictal, and buspar. Has been on seroquel in the past which was too sedating but otherwise has never tried other options. Denies SI/HI, side effects with current medications. Wanting to come off the saphris and try something else.   Right upper back pain, does lots of lifting at work. Worse with coughing or straining in any way. Trying OTC NSAIDs with minimal relief.   Depression screen [facility] 2/9 10/10/2017 10/05/2016 09/02/2015  Decreased Interest 0 0 0  Down, Depressed, Hopeless 0 0 0  PHQ - 2 Score 0 0 0  Altered sleeping 0 - 1  Tired, decreased energy - - 1  Change in appetite 0 - 0  Feeling bad or failure about yourself  - - 0  Trouble concentrating 0 - 0  Moving slowly or fidgety/restless 0 - 0  Suicidal thoughts 0 - 0  PHQ-9 Score 0 - 2  Difficult doing work/chores Not difficult at all - -   Relevant past medical, surgical, family and social history reviewed and updated as indicated. Interim medical history since our last visit reviewed. Allergies and medications reviewed and updated.  Review of Systems  Per HPI unless specifically indicated above     Objective:    BP 113/71   Pulse 64   Temp 98.3 F (36.8 C) (Oral)   Ht 5\' 8"  (1.727 m)   Wt 143 lb 6.4 oz (65 kg)   SpO2 98%   BMI 21.80 kg/m   Wt Readings from Last 3 Encounters:  01/05/18 143 lb 6.4 oz (65 kg)    12/21/17 143 lb (64.9 kg)  12/07/17 155 lb (70.3 kg)    Physical Exam  Constitutional: He is oriented to person, place, and time. He appears well-developed and well-nourished. No distress.  HENT:  Head: Atraumatic.  Eyes: Conjunctivae and EOM are normal.  Neck: Normal range of motion. Neck supple.  Cardiovascular: Normal rate, regular rhythm and normal heart sounds.  Pulmonary/Chest: Effort normal and breath sounds normal.  Musculoskeletal: Normal range of motion.  Neurological: He is alert and oriented to person, place, and time.  Skin: Skin is warm and dry.  Psychiatric: He has a normal mood and affect. His behavior is normal.  Nursing note and vitals reviewed.   Results for orders placed or performed in visit on 12/21/17  Veritor Flu A/B Waived  Result Value Ref Range   Influenza A Negative Negative   Influenza B Negative Negative      Assessment & Plan:   Problem List Items Addressed This Visit      Other   Bipolar disorder (HCC) - Primary    Currently between psychiatrists due to insurance coverage. Not happy with current  regimen, does not feel like saphris is helping. Will d/c and start latuda. Continue buspar and lamictal. F/u with new Psychiatrist. Referral generated today.       Relevant Orders   Ambulatory referral to Psychiatry    Other Visit Diagnoses    Muscle strain       Will start low dose flexeril, continue NSAIDs, discussed supportive care and return precautions. F/u if not improving       Follow up plan: Return in about 4 weeks (around 02/02/2018) for Mood f/u.

## 2018-01-05 NOTE — Patient Instructions (Signed)
Follow up in 1 month if not already in with Psychiatry

## 2018-01-05 NOTE — Assessment & Plan Note (Signed)
Currently between psychiatrists due to insurance coverage. Not happy with current regimen, does not feel like saphris is helping. Will d/c and start latuda. Continue buspar and lamictal. F/u with new Psychiatrist. Referral generated today.

## 2018-01-16 ENCOUNTER — Encounter: Payer: Self-pay | Admitting: Family Medicine

## 2018-01-28 ENCOUNTER — Encounter (HOSPITAL_COMMUNITY): Payer: Self-pay

## 2018-02-01 ENCOUNTER — Encounter: Payer: Self-pay | Admitting: Family Medicine

## 2018-02-02 ENCOUNTER — Encounter: Payer: Self-pay | Admitting: Family Medicine

## 2018-02-02 ENCOUNTER — Ambulatory Visit (INDEPENDENT_AMBULATORY_CARE_PROVIDER_SITE_OTHER): Payer: 59 | Admitting: Family Medicine

## 2018-02-02 VITALS — BP 105/68 | HR 76 | Temp 97.9°F | Ht 68.0 in | Wt 144.9 lb

## 2018-02-02 DIAGNOSIS — R229 Localized swelling, mass and lump, unspecified: Secondary | ICD-10-CM

## 2018-02-02 DIAGNOSIS — IMO0002 Reserved for concepts with insufficient information to code with codable children: Secondary | ICD-10-CM

## 2018-02-02 NOTE — Progress Notes (Signed)
BP 105/68 (BP Location: Left Arm, Patient Position: Sitting, Cuff Size: Normal)   Pulse 76   Temp 97.9 F (36.6 C) (Oral)   Ht 5\' 8"  (1.727 m)   Wt 144 lb 14.4 oz (65.7 kg)   SpO2 100%   BMI 22.03 kg/m    Subjective:    Patient ID: Joel Carr, male    DOB: 04-Apr-1985, 32 y.o.   MRN: [id number]  HPI: Joel Carr is a 32 y.o. male  Chief Complaint  Patient presents with  . Cyst    Knot on LLQ of abdomen. Patient states it's painful. Ongoing 6 days.   LUMP Duration: 1 week, has been getting worse Location: LLQ abdomen Onset: sudden- was lifting before it happened Painful: yes Discomfort: yes Status:  not changing Trauma: no Redness: no Bruising: yes Recent infection: no Swollen lymph nodes: no Requesting removal: no History of cancer: no Family history of cancer: no History of the same: no  Relevant past medical, surgical, family and social history reviewed and updated as indicated. Interim medical history since our last visit reviewed. Allergies and medications reviewed and updated.  Review of Systems  Constitutional: Negative.   Respiratory: Negative.   Cardiovascular: Negative.   Gastrointestinal: Positive for constipation and nausea. Negative for abdominal distention, abdominal pain, anal bleeding, blood in stool, diarrhea, rectal pain and vomiting.  Genitourinary: Negative.   Skin: Negative.   Neurological: Negative.   Psychiatric/Behavioral: Negative.     Per HPI unless specifically indicated above     Objective:    BP 105/68 (BP Location: Left Arm, Patient Position: Sitting, Cuff Size: Normal)   Pulse 76   Temp 97.9 F (36.6 C) (Oral)   Ht 5\' 8"  (1.727 m)   Wt 144 lb 14.4 oz (65.7 kg)   SpO2 100%   BMI 22.03 kg/m   Wt Readings from Last 3 Encounters:  02/02/18 144 lb 14.4 oz (65.7 kg)  01/05/18 143 lb 6.4 oz (65 kg)  12/21/17 143 lb (64.9 kg)    Physical Exam  Constitutional: He is oriented to person, place, and time. He appears  well-developed and well-nourished. No distress.  HENT:  Head: Normocephalic and atraumatic.  Right Ear: Hearing normal.  Left Ear: Hearing normal.  Nose: Nose normal.  Eyes: Conjunctivae and lids are normal. Right eye exhibits no discharge. Left eye exhibits no discharge. No scleral icterus.  Cardiovascular: Normal rate, regular rhythm, normal heart sounds and intact distal pulses. Exam reveals no gallop and no friction rub.  No murmur heard. Pulmonary/Chest: Effort normal and breath sounds normal. No stridor. No respiratory distress. He has no wheezes. He has no rales. He exhibits no tenderness.  Abdominal: Soft. Bowel sounds are normal. He exhibits no distension and no mass. There is no tenderness. There is no rebound and no guarding. No hernia.  1-2cm subcutaneous nodule in LLQ with some ecchymosis around it, tender to palpation  Musculoskeletal: Normal range of motion.  Neurological: He is alert and oriented to person, place, and time.  Skin: Skin is warm, dry and intact. Capillary refill takes less than 2 seconds. No rash noted. He is not diaphoretic. No erythema. No pallor.  Psychiatric: He has a normal mood and affect. His speech is normal and behavior is normal. Judgment and thought content normal. Cognition and memory are normal.  Nursing note and vitals reviewed.   Results for orders placed or performed in visit on 12/21/17  Veritor Flu A/B Waived  Result Value Ref Range  Influenza A Negative Negative   Influenza B Negative Negative      Assessment & Plan:   Problem List Items Addressed This Visit    None    Visit Diagnoses    Lump    -  Primary   Unlikely hernia, but will check [place]. Heat. Await results. Call with any concerns.    Relevant Orders   US Abdomen Limited       Follow up plan: Return if symptoms worsen or fail to improve.

## 2018-02-03 ENCOUNTER — Ambulatory Visit: Payer: 59 | Admitting: Family Medicine

## 2018-02-07 ENCOUNTER — Telehealth: Payer: Self-pay | Admitting: Family Medicine

## 2018-02-07 ENCOUNTER — Encounter: Payer: Self-pay | Admitting: Family Medicine

## 2018-02-07 ENCOUNTER — Ambulatory Visit
Admission: RE | Admit: 2018-02-07 | Discharge: 2018-02-07 | Disposition: A | Discharge status: Home / Self Care | Payer: 59 | Source: Ambulatory Visit | Attending: Family Medicine | Admitting: Family Medicine

## 2018-02-07 DIAGNOSIS — R229 Localized swelling, mass and lump, unspecified: Principal | ICD-10-CM

## 2018-02-07 DIAGNOSIS — R1900 Intra-abdominal and pelvic swelling, mass and lump, unspecified site: Secondary | ICD-10-CM | POA: Diagnosis not present

## 2018-02-07 DIAGNOSIS — IMO0002 Reserved for concepts with insufficient information to code with codable children: Secondary | ICD-10-CM

## 2018-02-07 NOTE — Telephone Encounter (Signed)
Please let him know that his [place] did show that there as a little nodule there, but didn't show what it was. I'm going to get him into the general surgeon to have them take a look at it. They should be calling him.

## 2018-02-07 NOTE — Telephone Encounter (Signed)
Called and notified patient of Dr. Johnson's message.  

## 2018-02-08 ENCOUNTER — Ambulatory Visit (INDEPENDENT_AMBULATORY_CARE_PROVIDER_SITE_OTHER): Payer: 59 | Admitting: General Surgery

## 2018-02-08 ENCOUNTER — Other Ambulatory Visit: Payer: Self-pay

## 2018-02-08 ENCOUNTER — Encounter: Payer: Self-pay | Admitting: General Surgery

## 2018-02-08 VITALS — BP 124/87 | HR 71 | Temp 97.7°F | Resp 14 | Ht 68.0 in | Wt 142.0 lb

## 2018-02-08 DIAGNOSIS — R222 Localized swelling, mass and lump, trunk: Secondary | ICD-10-CM

## 2018-02-08 NOTE — Patient Instructions (Addendum)
The patient is aware to use a heating pad and a gentle massage to the area as needed for comfort. The patient is aware to call back for any questions or new concerns. Recommend taking Ibuprofen as needed for pain

## 2018-02-08 NOTE — Progress Notes (Signed)
Patient ID: Joel Carr, male   DOB: November 15, 1985, 32 y.o.   MRN: [id number]  Chief Complaint  Patient presents with  . Mass    left abdomen x 2 weeks, ultrasound 02-07-18    HPI Joel Carr is a 32 y.o. male.   He is here for further evaluation of small knot in his left abdomen. He states that is has been present for about 2 weeks. He does give a history of sustaining a bruise to the exact area about 1 month ago, but cannot recall the trauma that resulted in the bruise. He feels like the area has become more painful, with a stabbing quality to the pain. There is no radiation to any other part of his body.  He states that lifting and bending make it worse; not much seems to make it any better. He denies fevers, chills, night sweats, or loss of appetite. He reports a 4 lb weight loss over the last month, with about 16 lbs lost over the past year. He says he thinks bruises more easily than he once did, but it doesn't seem to happen very often.   Past Medical History:  Diagnosis Date  . Bipolar disorder (HCC)   . Depression     Past Surgical History:  Procedure Laterality Date  . KNEE ARTHROSCOPY WITH MENISCAL REPAIR Right 10/29/2014   Procedure: KNEE ARTHROSCOPY WITH MENISCAL REPAIR;  Surgeon: Kennedy Bucker, MD;  Location: [facility] ORS;  Service: Orthopedics;  Laterality: Right;  . KNEE SURGERY Right   . TESTICLE SURGERY N/A    as a child    Family History  Problem Relation Age of Onset  . Diabetes Paternal Grandfather   . Heart attack Paternal Grandfather   . Colon cancer Neg Hx     Social History Social History   Tobacco Use  . Smoking status: Current Every Day Smoker    Packs/day: 1.00    Years: 10.00    Pack years: 10.00    Types: Cigarettes  . Smokeless tobacco: Never Used  Substance Use Topics  . Alcohol use: No  . Drug use: No    Allergies  Allergen Reactions  . Tramadol     Jittery    Current Outpatient Medications  Medication Sig Dispense Refill  . albuterol  (PROVENTIL HFA;VENTOLIN HFA) 108 (90 Base) MCG/ACT inhaler Inhale 2 puffs into the lungs every 6 (six) hours as needed for wheezing or shortness of breath. 1 Inhaler 2  . busPIRone (BUSPAR) 10 MG tablet Take 1 tablet (10 mg total) by mouth 3 (three) times daily. 90 tablet 2  . cyclobenzaprine (FLEXERIL) 5 MG tablet Take 1 tablet (5 mg total) by mouth at bedtime as needed for muscle spasms. 30 tablet 0  . gabapentin (NEURONTIN) 300 MG capsule Take 2 capsules (600 mg total) by mouth 2 (two) times daily. 360 capsule 4  . lamoTRIgine (LAMICTAL) 100 MG tablet Take 1 tablet (100 mg total) by mouth 2 (two) times daily. 60 tablet 3  . lurasidone (LATUDA) 20 MG TABS tablet Take 1 tablet (20 mg total) by mouth daily. 30 tablet 1  . pantoprazole (PROTONIX) 20 MG tablet Take 1 tablet (20 mg total) by mouth daily. 90 tablet 4  . traZODone (DESYREL) 50 MG tablet      No current facility-administered medications for this visit.     Review of Systems Review of Systems  All other systems reviewed and are negative.   Blood pressure 124/87, pulse 71, temperature 97.7 F (36.5  C), temperature source Skin, resp. rate 14, height 5\' 8"  (1.727 m), weight 142 lb (64.4 kg), SpO2 98 %.  Physical Exam Physical Exam  Constitutional: He is oriented to person, place, and time. He appears well-developed and well-nourished. No distress.  HENT:  Head: Normocephalic and atraumatic.  Mouth/Throat: Oropharynx is clear and moist.  Poor dental hygiene, strong odor of tobacco.  Eyes: Pupils are equal, round, and reactive to light. Right eye exhibits no discharge. Left eye exhibits no discharge. No scleral icterus.  Neck: Normal range of motion. Neck supple. No thyromegaly present.  Cardiovascular: Normal rate, regular rhythm, normal heart sounds and intact distal pulses.  Pulmonary/Chest: Effort normal and breath sounds normal.  Abdominal: Soft. Bowel sounds are normal. He exhibits mass.    There is a tiny, somewhat firm  area just medial and cranial to the left ASIS.  It is difficult to palpate and does not really feel like a discrete mass on my exam.  Musculoskeletal: He exhibits no edema or deformity.  Lymphadenopathy:    He has no cervical adenopathy.  Neurological: He is alert and oriented to person, place, and time.  Skin: Skin is warm and dry.  Psychiatric: He has a normal mood and affect.    Data Reviewed CLINICAL DATA:  32 year old male with a history of palpable nodule of the abdominal wall.  EXAM: ULTRASOUND ABDOMEN LIMITED  COMPARISON:  None.  FINDINGS: Grayscale and color duplex ultrasound performed. There is a small heterogeneously echoic soft tissue focus measuring 4 mm within the subcutaneous tissues with questionable vascularity. No focal fluid collection.  This does not have the typical architecture of a lymph node.  IMPRESSION: Nonspecific 4 mm soft tissue focus in the region of clinical concern. Ultrasound is nondiagnostic for the etiology.   Electronically Signed   By: Gilmer MorJaime  Wagner D.O.  Assessment    32 y/o M with a history of bruising in the area of the left ASIS. He now has a painful knot in the area. The imaging and physical exam are not suggestive of a hernia, lymphadenopathy, or other finding that might require surgery.  I suspect this is residual soft-tissue trauma/inflammation from whatever trauma caused the bruise.    Plan    No surgical treatment is recommended. I have advised conservative management with OTC analgesics as needed, massage, and heating pads to the area for symptomatic relief. RTC PRN.       Duanne GuessJennifer Raijon Lindfors 02/08/2018, 4:21 PM

## 2018-03-09 ENCOUNTER — Encounter: Payer: Self-pay | Admitting: Family Medicine

## 2018-05-16 DIAGNOSIS — J111 Influenza due to unidentified influenza virus with other respiratory manifestations: Secondary | ICD-10-CM | POA: Diagnosis not present

## 2018-05-16 DIAGNOSIS — R6889 Other general symptoms and signs: Secondary | ICD-10-CM | POA: Diagnosis not present

## 2018-05-16 DIAGNOSIS — Z20828 Contact with and (suspected) exposure to other viral communicable diseases: Secondary | ICD-10-CM | POA: Diagnosis not present

## 2018-06-06 ENCOUNTER — Encounter: Payer: Self-pay | Admitting: Family Medicine

## 2018-06-06 ENCOUNTER — Other Ambulatory Visit: Payer: Self-pay | Admitting: Family Medicine

## 2018-06-06 MED ORDER — LURASIDONE HCL 20 MG PO TABS: 20.0000 mg | ORAL_TABLET | Freq: Every day | ORAL | 1 refills | Status: DC

## 2018-06-27 ENCOUNTER — Encounter: Payer: Self-pay | Admitting: Family Medicine

## 2018-07-05 ENCOUNTER — Encounter: Payer: Self-pay | Admitting: Family Medicine

## 2018-07-07 ENCOUNTER — Other Ambulatory Visit: Payer: Self-pay

## 2018-07-07 ENCOUNTER — Ambulatory Visit (INDEPENDENT_AMBULATORY_CARE_PROVIDER_SITE_OTHER): Payer: 59 | Admitting: Family Medicine

## 2018-07-07 ENCOUNTER — Encounter: Payer: Self-pay | Admitting: Family Medicine

## 2018-07-07 VITALS — Wt 147.0 lb

## 2018-07-07 DIAGNOSIS — G2581 Restless legs syndrome: Secondary | ICD-10-CM

## 2018-07-07 DIAGNOSIS — K219 Gastro-esophageal reflux disease without esophagitis: Secondary | ICD-10-CM

## 2018-07-07 DIAGNOSIS — G4700 Insomnia, unspecified: Secondary | ICD-10-CM | POA: Diagnosis not present

## 2018-07-07 DIAGNOSIS — F3131 Bipolar disorder, current episode depressed, mild: Secondary | ICD-10-CM

## 2018-07-07 NOTE — Assessment & Plan Note (Signed)
Stable and under good control, continue current regimen 

## 2018-07-07 NOTE — Progress Notes (Signed)
Wt 147 lb (66.7 kg)   BMI 22.35 kg/m    Subjective:    Patient ID: Joel Carr, male    DOB: 12/15/1985, 33 y.o.   MRN: [id number]  HPI: SHARIF WOLVERTON is a 33 y.o. male  Chief Complaint  Patient presents with  . Manic Behavior    . This visit was completed via Skype due to the restrictions of the COVID-19 pandemic. All issues as above were discussed and addressed. Physical exam was done as above through visual confirmation on WebEx. If it was felt that the patient should be evaluated in the office, they were directed there. The patient verbally consented to this visit. . Location of the patient: home . Location of the provider: home . Those involved with this call:  . Provider: Roosvelt Maser, PA-C . CMA: Wilhemena Durie, CMA . Front Desk/Registration: Harriet Pho  . Time spent on call: 20 minutes with patient face to face via video conference. More than 50% of this time was spent in counseling and coordination of care. 5 minutes total spent in review of patient's record and preparation of their chart.   6 month f/u   Bipolar depression - latuda added at last visit, doing very well on this in addition to lamictal and buspar. Was able to stop the trazodone and swap it out for melatonin, sleeping very well now. Denies severe mood swings, SI/HI, anhedonia.   Has been able to cut back on the gabapentin quite a bit, now only taking 1 capsule 1-2 times weekly for his RLS. Doing very well on this regimen.   Taking protonix daily with good relief of reflux sxs. No abdominal pain, N/V/D.   Depression screen [facility] 2/9 07/07/2018 10/10/2017 10/05/2016  Decreased Interest 0 0 0  Down, Depressed, Hopeless 0 0 0  PHQ - 2 Score 0 0 0  Altered sleeping 1 0 -  Tired, decreased energy 0 - -  Change in appetite 0 0 -  Feeling bad or failure about yourself  0 - -  Trouble concentrating 0 0 -  Moving slowly or fidgety/restless 0 0 -  Suicidal thoughts 0 0 -  PHQ-9 Score 1 0 -  Difficult doing  work/chores Not difficult at all Not difficult at all -    Relevant past medical, surgical, family and social history reviewed and updated as indicated. Interim medical history since our last visit reviewed. Allergies and medications reviewed and updated.  Review of Systems  Per HPI unless specifically indicated above     Objective:    Wt 147 lb (66.7 kg)   BMI 22.35 kg/m   Wt Readings from Last 3 Encounters:  07/07/18 147 lb (66.7 kg)  02/08/18 142 lb (64.4 kg)  02/02/18 144 lb 14.4 oz (65.7 kg)    Physical Exam Vitals signs and nursing note reviewed.  Constitutional:      General: He is not in acute distress.    Appearance: Normal appearance.  HENT:     Head: Atraumatic.     Right Ear: External ear normal.     Left Ear: External ear normal.     Nose: Nose normal. No congestion.     Mouth/Throat:     Mouth: Mucous membranes are moist.     Pharynx: Oropharynx is clear.  Eyes:     Extraocular Movements: Extraocular movements intact.     Conjunctiva/sclera: Conjunctivae normal.  Neck:     Musculoskeletal: Normal range of motion.  Pulmonary:  Effort: Pulmonary effort is normal. No respiratory distress.  Musculoskeletal: Normal range of motion.  Skin:    General: Skin is dry.     Findings: No erythema or rash.  Neurological:     Mental Status: He is oriented to person, place, and time.  Psychiatric:        Mood and Affect: Mood normal.        Thought Content: Thought content normal.        Judgment: Judgment normal.     Results for orders placed or performed in visit on 12/21/17  Veritor Flu A/B Waived  Result Value Ref Range   Influenza A Negative Negative   Influenza B Negative Negative      Assessment & Plan:   Problem List Items Addressed This Visit      Digestive   GERD (gastroesophageal reflux disease)    Stable and under good control, continue current regimen        Other   Bipolar disorder (HCC) - Primary    Chronic, stable, under good  control with current regimen. Continue these medications      RLS (restless legs syndrome)    Stable and under good control with prn gabapentin 300 mg at bedtime. Continue current regimen      Insomnia    Stable off trazodone, continue melatonin and good sleep hygiene          Follow up plan: Return in about 6 months (around 01/06/2019) for CPE.

## 2018-07-07 NOTE — Assessment & Plan Note (Signed)
Stable off trazodone, continue melatonin and good sleep hygiene

## 2018-07-07 NOTE — Assessment & Plan Note (Signed)
Chronic, stable, under good control with current regimen. Continue these medications

## 2018-07-07 NOTE — Assessment & Plan Note (Signed)
Stable and under good control with prn gabapentin 300 mg at bedtime. Continue current regimen

## 2018-07-10 ENCOUNTER — Encounter: Payer: Self-pay | Admitting: Family Medicine

## 2018-07-10 ENCOUNTER — Other Ambulatory Visit: Payer: Self-pay | Admitting: Family Medicine

## 2018-07-10 NOTE — Telephone Encounter (Signed)
Requested medication (s) are due for refill today: yes  Requested medication (s) are on the active medication list: yes  Last refill:  01/05/18  Future visit scheduled: yes  Notes to clinic:  Medication not delegated to NT to refill   Requested Prescriptions  Pending Prescriptions Disp Refills   lamoTRIgine (LAMICTAL) 100 MG tablet [Pharmacy Med Name: LAMOTRIGINE 100 MG TAB] 60 tablet 3    Sig: TAKE 1 TABLET BY MOUTH TWICE A DAY     Not Delegated - Neurology:  Anticonvulsants Failed - 07/10/2018  9:20 AM      Failed - This refill cannot be delegated      Failed - HCT in normal range and within 360 days    HCT  Date Value Ref Range Status  10/19/2016 42.8 40.0 - 52.0 % Final   Hematocrit  Date Value Ref Range Status  10/05/2016 43.5 37.5 - 51.0 % Final         Failed - HGB in normal range and within 360 days    Hemoglobin  Date Value Ref Range Status  10/19/2016 15.1 13.0 - 18.0 g/dL Final  10/93/2355 73.2 13.0 - 17.7 g/dL Final         Failed - PLT in normal range and within 360 days    Platelets  Date Value Ref Range Status  10/19/2016 175 150 - 440 K/uL Final  10/05/2016 205 150 - 379 x10E3/uL Final         Failed - WBC in normal range and within 360 days    WBC  Date Value Ref Range Status  10/19/2016 6.3 3.8 - 10.6 K/uL Final         Passed - Valid encounter within last 12 months    Recent Outpatient Visits          3 days ago Bipolar affective disorder, currently depressed, mild (HCC)   [facility] [place], [place], PA-C   5 months ago Lump   [facility], [place], DO   6 months ago Bipolar affective disorder, currently depressed, mild [facility])   [facility] [place], [place], [place]   6 months ago Acute non-recurrent frontal sinusitis   [facility] Osvaldo Angst M, PA-C   9 months ago PE (physical exam), annual   [address] [facility] Crissman, Redge Gainer, MD      Future  Appointments            In 3 months Crissman, Redge Gainer, MD [facility], [facility]

## 2018-07-24 DIAGNOSIS — K0889 Other specified disorders of teeth and supporting structures: Secondary | ICD-10-CM | POA: Diagnosis not present

## 2018-07-24 DIAGNOSIS — K068 Other specified disorders of gingiva and edentulous alveolar ridge: Secondary | ICD-10-CM | POA: Diagnosis not present

## 2018-07-25 ENCOUNTER — Other Ambulatory Visit: Payer: Self-pay

## 2018-07-25 ENCOUNTER — Emergency Department
Admission: EM | Admit: 2018-07-25 | Discharge: 2018-07-25 | Disposition: A | Discharge status: Home / Self Care | Payer: 59 | Attending: Emergency Medicine | Admitting: Emergency Medicine

## 2018-07-25 DIAGNOSIS — F17210 Nicotine dependence, cigarettes, uncomplicated: Secondary | ICD-10-CM | POA: Diagnosis not present

## 2018-07-25 DIAGNOSIS — K029 Dental caries, unspecified: Secondary | ICD-10-CM

## 2018-07-25 DIAGNOSIS — K0889 Other specified disorders of teeth and supporting structures: Secondary | ICD-10-CM | POA: Diagnosis present

## 2018-07-25 DIAGNOSIS — Z79899 Other long term (current) drug therapy: Secondary | ICD-10-CM | POA: Diagnosis not present

## 2018-07-25 MED ORDER — CLINDAMYCIN HCL 300 MG PO CAPS: 300.0000 mg | ORAL_CAPSULE | Freq: Three times a day (TID) | ORAL | 0 refills | Status: AC

## 2018-07-25 MED ORDER — LIDOCAINE VISCOUS HCL 2 % MT SOLN
15.0000 mL | Freq: Once | OROMUCOSAL | Status: AC
  Administered 2018-07-25: 15 mL via OROMUCOSAL
  Filled 2018-07-25: qty 15

## 2018-07-25 MED ORDER — IBUPROFEN 600 MG PO TABS: 600.0000 mg | ORAL_TABLET | Freq: Four times a day (QID) | ORAL | 0 refills | Status: DC | PRN

## 2018-07-25 MED ORDER — LIDOCAINE VISCOUS HCL 2 % MT SOLN: 10.0000 mL | OROMUCOSAL | 0 refills | Status: DC | PRN

## 2018-07-25 MED ORDER — KETOROLAC TROMETHAMINE 30 MG/ML IJ SOLN
30.0000 mg | Freq: Once | INTRAMUSCULAR | Status: AC
  Administered 2018-07-25: 30 mg via INTRAMUSCULAR
  Filled 2018-07-25: qty 1

## 2018-07-25 MED ORDER — OXYCODONE-ACETAMINOPHEN 5-325 MG PO TABS: 1.0000 | ORAL_TABLET | ORAL | 0 refills | Status: DC | PRN

## 2018-07-25 NOTE — ED Notes (Signed)
See triage note  Presents with dental pain  States he was told he needed a root canal several weeks ago  But pain increased over the past 4-5 days  Denies any fever or trauma

## 2018-07-25 NOTE — ED Triage Notes (Signed)
Pt c/o left upper tooth ache for the past 4-5 days

## 2018-07-25 NOTE — ED Provider Notes (Signed)
[facility] Emergency Department Provider Note  ____________________________________________  Time seen: Approximately 8:05 AM  I have reviewed the triage vital signs and the nursing notes.   HISTORY  Chief Complaint Dental Pain    HPI Joel Carr is a 33 y.o. male that presents emergency department for evaluation of left upper tooth pain for 5 days.  Patient states that he had x-rays completed a couple of months ago and knows that he needs a root canal.  He called his dentist on Thursday who set him up for an appointment next Friday.  She prescribed him a course of amoxicillin which he started on Saturday.  He states that he has 1 more day left of the amoxicillin and pain has not improved.  He called his dentist, who recommended that he come to the emergency department, as Motrin and Tylenol are not covering his pain.  No fevers, swelling.  Past Medical History:  Diagnosis Date  . Bipolar disorder (HCC)   . Depression     Patient Active Problem List   Diagnosis Date Noted  . GERD (gastroesophageal reflux disease) 07/07/2018  . Insomnia 07/07/2018  . Nodule of skin of abdomen 02/08/2018  . Impingement syndrome of shoulder region 10/10/2017  . RLS (restless legs syndrome) 02/02/2017  . Hyperlipidemia 04/10/2015  . Bipolar disorder (HCC) 01/01/2015    Past Surgical History:  Procedure Laterality Date  . KNEE ARTHROSCOPY WITH MENISCAL REPAIR Right 10/29/2014   Procedure: KNEE ARTHROSCOPY WITH MENISCAL REPAIR;  Surgeon: Kennedy Bucker, MD;  Location: [facility] ORS;  Service: Orthopedics;  Laterality: Right;  . KNEE SURGERY Right   . TESTICLE SURGERY N/A    as a child    Prior to Admission medications   Medication Sig Start Date End Date Taking? Authorizing Provider  albuterol (PROVENTIL HFA;VENTOLIN HFA) 108 (90 Base) MCG/ACT inhaler Inhale 2 puffs into the lungs every 6 (six) hours as needed for wheezing or shortness of breath. 12/07/17   Tommi Rumps,  PA-C  clindamycin (CLEOCIN) 300 MG capsule Take 1 capsule (300 mg total) by mouth 3 (three) times daily for 10 days. 07/25/18 08/04/18  Enid Derry, PA-C  ibuprofen (ADVIL) 600 MG tablet Take 1 tablet (600 mg total) by mouth every 6 (six) hours as needed. 07/25/18   Enid Derry, PA-C  lamoTRIgine (LAMICTAL) 100 MG tablet TAKE 1 TABLET BY MOUTH TWICE A DAY 07/10/18   Crissman, Redge Gainer, MD  lidocaine (XYLOCAINE) 2 % solution Use as directed 10 mLs in the mouth or throat as needed. 07/25/18   Enid Derry, PA-C  lurasidone (LATUDA) 20 MG TABS tablet Take 1 tablet (20 mg total) by mouth daily. 06/06/18   Particia Nearing, PA-C  oxyCODONE-acetaminophen (PERCOCET) 5-325 MG tablet Take 1 tablet by mouth every 4 (four) hours as needed for severe pain. 07/25/18 07/25/19  Enid Derry, PA-C  pantoprazole (PROTONIX) 20 MG tablet Take 1 tablet (20 mg total) by mouth daily. 10/10/17 10/10/18  Steele Sizer, MD    Allergies Tramadol  Family History  Problem Relation Age of Onset  . Diabetes Paternal Grandfather   . Heart attack Paternal Grandfather   . Colon cancer Neg Hx     Social History Social History   Tobacco Use  . Smoking status: Current Every Day Smoker    Packs/day: 1.00    Years: 10.00    Pack years: 10.00    Types: Cigarettes  . Smokeless tobacco: Never Used  Substance Use Topics  . Alcohol use:  No  . Drug use: No     Review of Systems  Constitutional: No fever/chills Cardiovascular: No chest pain. Respiratory: No SOB. Gastrointestinal: No abdominal pain.  No nausea, no vomiting.  Musculoskeletal: Negative for musculoskeletal pain. Skin: Negative for rash, abrasions, lacerations, ecchymosis. Neurological: Negative for headaches   ____________________________________________   PHYSICAL EXAM:  VITAL SIGNS: ED Triage Vitals  Enc Vitals Group     BP 07/25/18 0755 (!) 145/92     Pulse Rate 07/25/18 0755 86     Resp 07/25/18 0755 18     Temp 07/25/18 0755 97.7  F (36.5 C)     Temp Source 07/25/18 0755 Oral     SpO2 07/25/18 0755 99 %     Weight 07/25/18 0751 150 lb (68 kg)     Height 07/25/18 0751 5\' 8"  (1.727 m)     Head Circumference --      Peak Flow --      Pain Score 07/25/18 0751 10     Pain Loc --      Pain Edu? --      Excl. in GC? --      Constitutional: Alert and oriented. Well appearing and in no acute distress. Eyes: Conjunctivae are normal. PERRL. EOMI. Head: Atraumatic. ENT:      Ears:      Nose: No congestion/rhinnorhea.      Mouth/Throat: Mucous membranes are moist.  Tenderness to palpation to tooth #12.  No swelling.  No drainage.  No difficulty opening or closing mouth.  Full range of motion of jaw. Neck: No stridor. Cardiovascular: Normal rate, regular rhythm.  Good peripheral circulation. Respiratory: Normal respiratory effort without tachypnea or retractions. Lungs CTAB. Good air entry to the bases with no decreased or absent breath sounds. Musculoskeletal: Full range of motion to all extremities. No gross deformities appreciated. Neurologic:  Normal speech and language. No gross focal neurologic deficits are appreciated.  Skin:  Skin is warm, dry and intact. No rash noted. Psychiatric: Mood and affect are normal. Speech and behavior are normal. Patient exhibits appropriate insight and judgement.   ____________________________________________   LABS (all labs ordered are listed, but only abnormal results are displayed)  Labs Reviewed - No data to display ____________________________________________  EKG   ____________________________________________  RADIOLOGY  No results found.  ____________________________________________    PROCEDURES  Procedure(s) performed:    Procedures    Medications  ketorolac (TORADOL) 30 MG/ML injection 30 mg (has no administration in time range)  lidocaine (XYLOCAINE) 2 % viscous mouth solution 15 mL (has no administration in time range)      ____________________________________________   INITIAL IMPRESSION / ASSESSMENT AND PLAN / ED COURSE  Pertinent labs & imaging results that were available during my care of the patient were reviewed by me and considered in my medical decision making (see chart for details).  Review of the [place] CSRS was performed in accordance of the [facility] prior to dispensing any controlled drugs.     Patient presented emergency department for evaluation of dental pain.  Vital signs and exam are reassuring.  Patient is already on a course of amoxicillin and pain has not improved so will be switched to clindamycin.  He has an appointment with his dentist next Friday and is agreeable to follow-up with this appointment.  He will be given a short course of Percocet for pain.  Patient will be discharged home with prescriptions for amoxicillin and viscous lidocaine. Patient is to follow up with dentist as  directed. He has an appointment with dentist scheduled. Patient is given ED precautions to return to the ED for any worsening or new symptoms.     ____________________________________________  FINAL CLINICAL IMPRESSION(S) / ED DIAGNOSES  Final diagnoses:  Dental caries      NEW MEDICATIONS STARTED DURING THIS VISIT:  ED Discharge Orders         Ordered    clindamycin (CLEOCIN) 300 MG capsule  3 times daily     07/25/18 0804    lidocaine (XYLOCAINE) 2 % solution  As needed     07/25/18 0804    oxyCODONE-acetaminophen (PERCOCET) 5-325 MG tablet  Every 4 hours PRN     07/25/18 0804    ibuprofen (ADVIL) 600 MG tablet  Every 6 hours PRN     07/25/18 0804              This chart was dictated using voice recognition software/Dragon. Despite best efforts to proofread, errors can occur which can change the meaning. Any change was purely unintentional.    Enid Derry, PA-C 07/25/18 1600    Emily Filbert, MD 07/25/18 [phone]

## 2018-08-09 ENCOUNTER — Encounter: Payer: Self-pay | Admitting: Family Medicine

## 2018-08-09 ENCOUNTER — Other Ambulatory Visit: Payer: Self-pay | Admitting: Family Medicine

## 2018-08-09 NOTE — Telephone Encounter (Signed)
Requested medication (s) are due for refill today: yes  Requested medication (s) are on the active medication list: yes  Last refill:  06/06/2018  Future visit scheduled: yes  Notes to clinic:  Med can not be delegated  Requested Prescriptions  Pending Prescriptions Disp Refills   LATUDA 20 MG TABS tablet [Pharmacy Med Name: LATUDA 20 MG TAB] 30 tablet 1    Sig: TAKE 1 TABLET BY MOUTH DAILY     Not Delegated - Psychiatry:  Antipsychotics - Second Generation (Atypical) - lurasidone Failed - 08/09/2018  8:47 AM      Failed - This refill cannot be delegated      Failed - Last BP in normal range    BP Readings from Last 1 Encounters:  07/25/18 (!) 145/92         Passed - Last Heart Rate in normal range    Pulse Readings from Last 1 Encounters:  07/25/18 86         Passed - Valid encounter within last 6 months    Recent Outpatient Visits          1 month ago Bipolar affective disorder, currently depressed, mild (HCC)   [facility] [place], [place], PA-C   6 months ago Lump   [facility] [place], [place], DO   7 months ago Bipolar affective disorder, currently depressed, mild [facility])   [facility], [place], [place]   7 months ago Acute non-recurrent frontal sinusitis   [facility] Osvaldo Angst M, PA-C   10 months ago PE (physical exam), annual   [address] [facility] Crissman, Redge Gainer, MD      Future Appointments            In 2 months Crissman, Redge Gainer, MD [facility], [facility]

## 2018-09-19 ENCOUNTER — Encounter: Payer: Self-pay | Admitting: Family Medicine

## 2018-09-19 NOTE — Telephone Encounter (Signed)
Needs appt

## 2018-09-20 ENCOUNTER — Ambulatory Visit (INDEPENDENT_AMBULATORY_CARE_PROVIDER_SITE_OTHER): Payer: 59 | Admitting: Family Medicine

## 2018-09-20 ENCOUNTER — Other Ambulatory Visit: Payer: Self-pay

## 2018-09-20 ENCOUNTER — Encounter: Payer: Self-pay | Admitting: Family Medicine

## 2018-09-20 VITALS — BP 129/78 | HR 70 | Temp 98.1°F | Ht 68.0 in | Wt 157.0 lb

## 2018-09-20 DIAGNOSIS — F419 Anxiety disorder, unspecified: Secondary | ICD-10-CM

## 2018-09-20 MED ORDER — BUSPIRONE HCL 10 MG PO TABS: 10.0000 mg | ORAL_TABLET | Freq: Three times a day (TID) | ORAL | 0 refills | Status: DC | PRN

## 2018-09-20 NOTE — Progress Notes (Signed)
BP 129/78   Pulse 70   Temp 98.1 F (36.7 C) (Oral)   Ht 5\' 8"  (1.727 m)   Wt 157 lb (71.2 kg)   SpO2 98%   BMI 23.87 kg/m    Subjective:    Patient ID: Joel Carr, male    DOB: 07/31/1985, 33 y.o.   MRN: [id number]  HPI: Joel Carr is a 33 y.o. male  Chief Complaint  Patient presents with  . Anxiety    pt states has been feeling very restless for a couple of weeks ago   2 weeks or so of feeling very anxious, jittery, irritated about 4 days per week. Lasts all day, no apparent triggers. Currently on latuda and lamictal which has been doing very well for mood control. Has tried buspar in the past which worked fairly well when he was dealing with anxiety issues. Has been on trazodone in the past for sleep and nighttime anxiousness but does not remember why he stopped it. Denies SI/HI, severe mood swings.   [facility] 09/20/2018 07/07/2018 10/10/2017  Decreased Interest 0 0 0  Down, Depressed, Hopeless 0 0 0  PHQ - 2 Score 0 0 0  Altered sleeping 1 1 0  Tired, decreased energy 1 0 -  Change in appetite 0 0 0  Feeling bad or failure about yourself  0 0 -  Trouble concentrating 2 0 0  Moving slowly or fidgety/restless 2 0 0  Suicidal thoughts 0 0 0  PHQ-9 Score 6 1 0  Difficult doing work/chores - Not difficult at all Not difficult at all   GAD 7 : Generalized Anxiety Score 09/20/2018  Nervous, Anxious, on Edge 2  Control/stop worrying 1  Worry too much - different things 1  Trouble relaxing 3  Restless 3  Easily annoyed or irritable 1  Afraid - awful might happen 0  Total GAD 7 Score 11  Anxiety Difficulty Somewhat difficult   Relevant past medical, surgical, family and social history reviewed and updated as indicated. Interim medical history since our last visit reviewed. Allergies and medications reviewed and updated.  Review of Systems  Per HPI unless specifically indicated above     Objective:    BP 129/78   Pulse 70   Temp 98.1 F (36.7  C) (Oral)   Ht 5\' 8"  (1.727 m)   Wt 157 lb (71.2 kg)   SpO2 98%   BMI 23.87 kg/m   Wt Readings from Last 3 Encounters:  09/20/18 157 lb (71.2 kg)  07/25/18 150 lb (68 kg)  07/07/18 147 lb (66.7 kg)    Physical Exam Vitals signs and nursing note reviewed.  Constitutional:      Appearance: Normal appearance.  HENT:     Head: Atraumatic.  Eyes:     Extraocular Movements: Extraocular movements intact.     Conjunctiva/sclera: Conjunctivae normal.  Neck:     Musculoskeletal: Normal range of motion and neck supple.  Cardiovascular:     Rate and Rhythm: Normal rate and regular rhythm.  Pulmonary:     Effort: Pulmonary effort is normal.     Breath sounds: Normal breath sounds.  Musculoskeletal: Normal range of motion.  Skin:    General: Skin is warm and dry.  Neurological:     General: No focal deficit present.     Mental Status: He is oriented to person, place, and time.  Psychiatric:        Mood and Affect: Mood normal.  Thought Content: Thought content normal.        Judgment: Judgment normal.     Results for orders placed or performed in visit on 12/21/17  Veritor Flu A/B Waived  Result Value Ref Range   Influenza A Negative Negative   Influenza B Negative Negative      Assessment & Plan:   Problem List Items Addressed This Visit    None    Visit Diagnoses    Anxiety    -  Primary   Will restart prn buspar for his sporadic anxiousness, continue lamictal and latuda for mood stability   Relevant Medications   busPIRone (BUSPAR) 10 MG tablet       Follow up plan: Return in about 4 weeks (around 10/18/2018) for Anxiety f/u.

## 2018-10-02 ENCOUNTER — Encounter: Payer: Self-pay | Admitting: Family Medicine

## 2018-10-03 ENCOUNTER — Other Ambulatory Visit: Payer: Self-pay | Admitting: Family Medicine

## 2018-10-03 MED ORDER — LURASIDONE HCL 40 MG PO TABS: 40.0000 mg | ORAL_TABLET | Freq: Every day | ORAL | 0 refills | Status: DC

## 2018-10-03 MED ORDER — GABAPENTIN 300 MG PO CAPS: 600.0000 mg | ORAL_CAPSULE | Freq: Two times a day (BID) | ORAL | 2 refills | Status: DC | PRN

## 2018-10-06 ENCOUNTER — Encounter: Payer: Self-pay | Admitting: Family Medicine

## 2018-10-09 ENCOUNTER — Other Ambulatory Visit: Payer: Self-pay | Admitting: Family Medicine

## 2018-10-09 NOTE — Telephone Encounter (Signed)
Please advise. You are booked on physical limits for Friday.

## 2018-10-16 ENCOUNTER — Encounter: Payer: BLUE CROSS/BLUE SHIELD | Admitting: Family Medicine

## 2018-10-20 ENCOUNTER — Encounter: Payer: Self-pay | Admitting: Family Medicine

## 2018-10-20 ENCOUNTER — Other Ambulatory Visit: Payer: Self-pay

## 2018-10-20 ENCOUNTER — Ambulatory Visit (INDEPENDENT_AMBULATORY_CARE_PROVIDER_SITE_OTHER): Payer: 59 | Admitting: Family Medicine

## 2018-10-20 VITALS — BP 134/85 | HR 59 | Temp 98.3°F | Ht 68.0 in | Wt 160.0 lb

## 2018-10-20 DIAGNOSIS — F419 Anxiety disorder, unspecified: Secondary | ICD-10-CM

## 2018-10-20 DIAGNOSIS — F3131 Bipolar disorder, current episode depressed, mild: Secondary | ICD-10-CM

## 2018-10-20 DIAGNOSIS — E7800 Pure hypercholesterolemia, unspecified: Secondary | ICD-10-CM

## 2018-10-20 DIAGNOSIS — Z0000 Encounter for general adult medical examination without abnormal findings: Secondary | ICD-10-CM

## 2018-10-20 DIAGNOSIS — K219 Gastro-esophageal reflux disease without esophagitis: Secondary | ICD-10-CM

## 2018-10-20 DIAGNOSIS — G2581 Restless legs syndrome: Secondary | ICD-10-CM | POA: Diagnosis not present

## 2018-10-20 DIAGNOSIS — Z113 Encounter for screening for infections with a predominantly sexual mode of transmission: Secondary | ICD-10-CM | POA: Diagnosis not present

## 2018-10-20 MED ORDER — LURASIDONE HCL 40 MG PO TABS: 40.0000 mg | ORAL_TABLET | Freq: Every day | ORAL | 1 refills | Status: DC

## 2018-10-20 MED ORDER — GABAPENTIN 300 MG PO CAPS: 600.0000 mg | ORAL_CAPSULE | Freq: Two times a day (BID) | ORAL | 1 refills | Status: DC | PRN

## 2018-10-20 MED ORDER — LAMOTRIGINE 100 MG PO TABS: 100.0000 mg | ORAL_TABLET | Freq: Two times a day (BID) | ORAL | 1 refills | Status: DC

## 2018-10-20 MED ORDER — PANTOPRAZOLE SODIUM 20 MG PO TBEC: 20.0000 mg | DELAYED_RELEASE_TABLET | Freq: Every day | ORAL | 3 refills | Status: DC

## 2018-10-20 NOTE — Progress Notes (Signed)
BP 134/85   Pulse (!) 59   Temp 98.3 F (36.8 C) (Oral)   Ht 5\' 8"  (1.727 m)   Wt 160 lb (72.6 kg)   SpO2 98%   BMI 24.33 kg/m    Subjective:    Patient ID: Joel RalphBryan D Porreca, male    DOB: 01/04/1986, 33 y.o.   MRN: [id number]  HPI: Joel Carr is a 33 y.o. male presenting on 10/20/2018 for comprehensive medical examination. Current medical complaints include:see below  Doing better with increased latuda and prn buspar for his anxiety. Also takes lamictal for mood stabilization. Feels his moods are stable and anxiety is under better control. No side effects to current regimen. Denies SI/HI.   Gabapentin doing well for RLS, no new issues there.   Taking protonix prn for GERD with good relief of sxs.   He currently lives with: Interim Problems from his last visit: no  Depression Screen done today and results listed below:  [facility] 09/20/2018 07/07/2018 10/10/2017 10/05/2016 09/02/2015  Decreased Interest 0 0 0 0 0  Down, Depressed, Hopeless 0 0 0 0 0  PHQ - 2 Score 0 0 0 0 0  Altered sleeping 1 1 0 - 1  Tired, decreased energy 1 0 - - 1  Change in appetite 0 0 0 - 0  Feeling bad or failure about yourself  0 0 - - 0  Trouble concentrating 2 0 0 - 0  Moving slowly or fidgety/restless 2 0 0 - 0  Suicidal thoughts 0 0 0 - 0  PHQ-9 Score 6 1 0 - 2  Difficult doing work/chores - Not difficult at all Not difficult at all - -    The patient does not have a history of falls. I did not complete a risk assessment for falls. A plan of care for falls was not documented.   Past Medical History:  Past Medical History:  Diagnosis Date  . Bipolar disorder (HCC)   . Depression     Surgical History:  Past Surgical History:  Procedure Laterality Date  . KNEE ARTHROSCOPY WITH MENISCAL REPAIR Right 10/29/2014   Procedure: KNEE ARTHROSCOPY WITH MENISCAL REPAIR;  Surgeon: Kennedy BuckerMichael Menz, MD;  Location: [facility] ORS;  Service: Orthopedics;  Laterality: Right;  . KNEE SURGERY Right    . TESTICLE SURGERY N/A    as a child    Medications:  Current Outpatient Medications on File Prior to Visit  Medication Sig  . busPIRone (BUSPAR) 10 MG tablet Take 1 tablet (10 mg total) by mouth 3 (three) times daily as needed.   No current facility-administered medications on file prior to visit.     Allergies:  Allergies  Allergen Reactions  . Tramadol     Jittery    Social History:  Social History   Socioeconomic History  . Marital status: Married    Spouse name: Not on file  . Number of children: Not on file  . Years of education: Not on file  . Highest education level: Not on file  Occupational History  . Not on file  Social Needs  . Financial resource strain: Patient refused  . Food insecurity    Worry: Patient refused    Inability: Patient refused  . Transportation needs    Medical: Patient refused    Non-medical: Patient refused  Tobacco Use  . Smoking status: Current Every Day Smoker    Packs/day: 1.00    Years: 10.00    Pack years: 10.00  Types: Cigarettes  . Smokeless tobacco: Never Used  Substance and Sexual Activity  . Alcohol use: No  . Drug use: No  . Sexual activity: Yes  Lifestyle  . Physical activity    Days per week: Patient refused    Minutes per session: Patient refused  . Stress: Patient refused  Relationships  . Social Herbalist on phone: Patient refused    Gets together: Patient refused    Attends religious service: Patient refused    Active member of club or organization: Patient refused    Attends meetings of clubs or organizations: Patient refused    Relationship status: Married  . Intimate partner violence    Fear of current or ex partner: Patient refused    Emotionally abused: Patient refused    Physically abused: Patient refused    Forced sexual activity: Patient refused  Other Topics Concern  . Not on file  Social History Narrative  . Not on file   Social History   Tobacco Use  Smoking Status  Current Every Day Smoker  . Packs/day: 1.00  . Years: 10.00  . Pack years: 10.00  . Types: Cigarettes  Smokeless Tobacco Never Used   Social History   Substance and Sexual Activity  Alcohol Use No    Family History:  Family History  Problem Relation Age of Onset  . Diabetes Paternal Grandfather   . Heart attack Paternal Grandfather   . Colon cancer Neg Hx     Past medical history, surgical history, medications, allergies, family history and social history reviewed with patient today and changes made to appropriate areas of the chart.   Review of Systems - General ROS: negative Psychological ROS: negative Ophthalmic ROS: negative ENT ROS: negative Allergy and Immunology ROS: negative Hematological and Lymphatic ROS: negative Endocrine ROS: negative Breast ROS: negative for breast lumps Respiratory ROS: no cough, shortness of breath, or wheezing Cardiovascular ROS: no chest pain or dyspnea on exertion Gastrointestinal ROS: no abdominal pain, change in bowel habits, or black or bloody stools Genito-Urinary ROS: no dysuria, trouble voiding, or hematuria Musculoskeletal ROS: negative Neurological ROS: no TIA or stroke symptoms Dermatological ROS: negative All other ROS negative except what is listed above and in the HPI.      Objective:    BP 134/85   Pulse (!) 59   Temp 98.3 F (36.8 C) (Oral)   Ht 5\' 8"  (1.727 m)   Wt 160 lb (72.6 kg)   SpO2 98%   BMI 24.33 kg/m   Wt Readings from Last 3 Encounters:  10/20/18 160 lb (72.6 kg)  09/20/18 157 lb (71.2 kg)  07/25/18 150 lb (68 kg)    Physical Exam Vitals signs and nursing note reviewed.  Constitutional:      General: He is not in acute distress.    Appearance: He is well-developed.  HENT:     Head: Atraumatic.     Right Ear: Tympanic membrane and external ear normal.     Left Ear: Tympanic membrane and external ear normal.     Nose: Nose normal.     Mouth/Throat:     Mouth: Mucous membranes are moist.      Pharynx: Oropharynx is clear.  Eyes:     General: No scleral icterus.    Conjunctiva/sclera: Conjunctivae normal.     Pupils: Pupils are equal, round, and reactive to light.  Neck:     Musculoskeletal: Normal range of motion and neck supple.  Cardiovascular:  Rate and Rhythm: Normal rate and regular rhythm.     Heart sounds: Normal heart sounds. No murmur.  Pulmonary:     Effort: Pulmonary effort is normal. No respiratory distress.     Breath sounds: Normal breath sounds.  Abdominal:     General: Bowel sounds are normal. There is no distension.     Palpations: Abdomen is soft. There is no mass.     Tenderness: There is no abdominal tenderness. There is no guarding.  Genitourinary:    Comments: GU exam declined today Musculoskeletal: Normal range of motion.        General: No tenderness.  Skin:    General: Skin is warm and dry.     Findings: No rash.  Neurological:     General: No focal deficit present.     Mental Status: He is alert and oriented to person, place, and time.     Deep Tendon Reflexes: Reflexes are normal and symmetric.  Psychiatric:        Mood and Affect: Mood normal.        Behavior: Behavior normal.        Thought Content: Thought content normal.        Judgment: Judgment normal.     Results for orders placed or performed in visit on 10/20/18  CBC with Differential/Platelet  Result Value Ref Range   WBC 7.8 3.4 - 10.8 x10E3/uL   RBC 5.08 4.14 - 5.80 x10E6/uL   Hemoglobin 15.5 13.0 - 17.7 g/dL   Hematocrit 16.145.7 09.637.5 - 51.0 %   MCV 90 79 - 97 fL   MCH 30.5 26.6 - 33.0 pg   MCHC 33.9 31.5 - 35.7 g/dL   RDW 04.513.2 40.911.6 - 81.115.4 %   Platelets 221 150 - 450 x10E3/uL   Neutrophils 57 Not Estab. %   Lymphs 36 Not Estab. %   Monocytes 5 Not Estab. %   Eos 1 Not Estab. %   Basos 0 Not Estab. %   Neutrophils Absolute 4.4 1.4 - 7.0 x10E3/uL   Lymphocytes Absolute 2.8 0.7 - 3.1 x10E3/uL   Monocytes Absolute 0.4 0.1 - 0.9 x10E3/uL   EOS (ABSOLUTE) 0.1 0.0 -  0.4 x10E3/uL   Basophils Absolute 0.0 0.0 - 0.2 x10E3/uL   Immature Granulocytes 1 Not Estab. %   Immature Grans (Abs) 0.0 0.0 - 0.1 x10E3/uL  Comprehensive metabolic panel  Result Value Ref Range   Glucose 99 65 - 99 mg/dL   BUN 7 6 - 20 mg/dL   Creatinine, Ser 9.140.79 0.76 - 1.27 mg/dL   GFR calc non Af Amer 117 >59 mL/min/1.73   GFR calc Af Amer 135 >59 mL/min/1.73   BUN/Creatinine Ratio 9 9 - 20   Sodium 142 134 - 144 mmol/L   Potassium 4.0 3.5 - 5.2 mmol/L   Chloride 102 96 - 106 mmol/L   CO2 25 20 - 29 mmol/L   Calcium 9.5 8.7 - 10.2 mg/dL   Total Protein 6.2 6.0 - 8.5 g/dL   Albumin 4.8 4.0 - 5.0 g/dL   Globulin, Total 1.4 (L) 1.5 - 4.5 g/dL   Albumin/Globulin Ratio 3.4 (H) 1.2 - 2.2   Bilirubin Total 0.2 0.0 - 1.2 mg/dL   Alkaline Phosphatase 82 39 - 117 IU/L   AST 22 0 - 40 IU/L   ALT 25 0 - 44 IU/L  Lipid Panel w/o Chol/HDL Ratio  Result Value Ref Range   Cholesterol, Total 230 (H) 100 - 199 mg/dL   Triglycerides  329 (H) 0 - 149 mg/dL   HDL 42 >60 mg/dL   VLDL Cholesterol Cal 66 (H) 5 - 40 mg/dL   LDL Calculated 454 (H) 0 - 99 mg/dL  UA/M w/rflx Culture, Routine   Specimen: Urine   URINE  Result Value Ref Range   Specific Gravity, UA 1.015 1.005 - 1.030   pH, UA 7.0 5.0 - 7.5   Color, UA Yellow Yellow   Appearance Ur Clear Clear   Leukocytes,UA Negative Negative   Protein,UA Negative Negative/Trace   Glucose, UA Negative Negative   Ketones, UA Negative Negative   RBC, UA Negative Negative   Bilirubin, UA Negative Negative   Urobilinogen, Ur 0.2 0.2 - 1.0 mg/dL   Nitrite, UA Negative Negative  HIV Antibody (routine testing w rflx)  Result Value Ref Range   HIV Screen 4th Generation wRfx Non Reactive Non Reactive  RPR  Result Value Ref Range   RPR Ser Ql Non Reactive Non Reactive  HSV(herpes simplex vrs) 1+2 ab-IgG  Result Value Ref Range   HSV 1 Glycoprotein G Ab, IgG <0.91 0.00 - 0.90 index   HSV 2 IgG, Type Spec <0.91 0.00 - 0.90 index       Assessment & Plan:   Problem List Items Addressed This Visit      Digestive   GERD (gastroesophageal reflux disease)    Under good control, continue protonix prn      Relevant Medications   pantoprazole (PROTONIX) 20 MG tablet     Other   Bipolar disorder (HCC) - Primary    Moods stable and under good control, continue current regimen      Hyperlipidemia    Recheck lipids, adjust as needed. Lifestyle modifications reviewed      RLS (restless legs syndrome)    Stable and under good control, continue gabapentin regimen      Anxiety    Buspar prn seems to help, continue this in addition to latuda and lamictal for mood stabilization       Other Visit Diagnoses    Annual physical exam       Relevant Orders   CBC with Differential/Platelet (Completed)   Comprehensive metabolic panel (Completed)   Lipid Panel w/o Chol/HDL Ratio (Completed)   UA/M w/rflx Culture, Routine (Completed)   Routine screening for STI (sexually transmitted infection)       Relevant Orders   HIV Antibody (routine testing w rflx) (Completed)   RPR (Completed)   HSV(herpes simplex vrs) 1+2 ab-IgG (Completed)   GC/Chlamydia Probe Amp       Discussed aspirin prophylaxis for myocardial infarction prevention and decision was it was not indicated  LABORATORY TESTING:  Health maintenance labs ordered today as discussed above.   The natural history of prostate cancer and ongoing controversy regarding screening and potential treatment outcomes of prostate cancer has been discussed with the patient. The meaning of a false positive PSA and a false negative PSA has been discussed. He indicates understanding of the limitations of this screening test and wishes not to proceed with screening PSA testing.   IMMUNIZATIONS:   - Tdap: Tetanus vaccination status reviewed: last tetanus booster within 10 years. - Influenza: Postponed to flu season  PATIENT COUNSELING:    Sexuality: Discussed sexually transmitted  diseases, partner selection, use of condoms, avoidance of unintended pregnancy  and contraceptive alternatives.   Advised to avoid cigarette smoking.  I discussed with the patient that most people either abstain from alcohol or drink within safe limits (<=14/week  and <=4 drinks/occasion for males, <=7/weeks and <= 3 drinks/occasion for females) and that the risk for alcohol disorders and other health effects rises proportionally with the number of drinks per week and how often a drinker exceeds daily limits.  Discussed cessation/primary prevention of drug use and availability of treatment for abuse.   Diet: Encouraged to adjust caloric intake to maintain  or achieve ideal body weight, to reduce intake of dietary saturated fat and total fat, to limit sodium intake by avoiding high sodium foods and not adding table salt, and to maintain adequate dietary potassium and calcium preferably from fresh fruits, vegetables, and low-fat dairy products.    stressed the importance of regular exercise  Injury prevention: Discussed safety belts, safety helmets, smoke detector, smoking near bedding or upholstery.   Dental health: Discussed importance of regular tooth brushing, flossing, and dental visits.   Follow up plan: NEXT PREVENTATIVE PHYSICAL DUE IN 1 YEAR. Return in about 6 months (around 04/22/2019) for 6 month f/u.

## 2018-10-21 LAB — CBC WITH DIFFERENTIAL/PLATELET
Basophils Absolute: 0 10*3/uL (ref 0.0–0.2)
Basos: 0 %
EOS (ABSOLUTE): 0.1 10*3/uL (ref 0.0–0.4)
Eos: 1 %
Hematocrit: 45.7 % (ref 37.5–51.0)
Hemoglobin: 15.5 g/dL (ref 13.0–17.7)
Immature Grans (Abs): 0 10*3/uL (ref 0.0–0.1)
Immature Granulocytes: 1 %
Lymphocytes Absolute: 2.8 10*3/uL (ref 0.7–3.1)
Lymphs: 36 %
MCH: 30.5 pg (ref 26.6–33.0)
MCHC: 33.9 g/dL (ref 31.5–35.7)
MCV: 90 fL (ref 79–97)
Monocytes Absolute: 0.4 10*3/uL (ref 0.1–0.9)
Monocytes: 5 %
Neutrophils Absolute: 4.4 10*3/uL (ref 1.4–7.0)
Neutrophils: 57 %
Platelets: 221 10*3/uL (ref 150–450)
RBC: 5.08 x10E6/uL (ref 4.14–5.80)
RDW: 13.2 % (ref 11.6–15.4)
WBC: 7.8 10*3/uL (ref 3.4–10.8)

## 2018-10-21 LAB — COMPREHENSIVE METABOLIC PANEL
ALT: 25 IU/L (ref 0–44)
AST: 22 IU/L (ref 0–40)
Albumin/Globulin Ratio: 3.4 — ABNORMAL HIGH (ref 1.2–2.2)
Albumin: 4.8 g/dL (ref 4.0–5.0)
Alkaline Phosphatase: 82 IU/L (ref 39–117)
BUN/Creatinine Ratio: 9 (ref 9–20)
BUN: 7 mg/dL (ref 6–20)
Bilirubin Total: 0.2 mg/dL (ref 0.0–1.2)
CO2: 25 mmol/L (ref 20–29)
Calcium: 9.5 mg/dL (ref 8.7–10.2)
Chloride: 102 mmol/L (ref 96–106)
Creatinine, Ser: 0.79 mg/dL (ref 0.76–1.27)
GFR calc Af Amer: 135 mL/min/{1.73_m2} (ref >59)
GFR calc non Af Amer: 117 mL/min/{1.73_m2} (ref >59)
Globulin, Total: 1.4 g/dL — ABNORMAL LOW (ref 1.5–4.5)
Glucose: 99 mg/dL (ref 65–99)
Potassium: 4 mmol/L (ref 3.5–5.2)
Sodium: 142 mmol/L (ref 134–144)
Total Protein: 6.2 g/dL (ref 6.0–8.5)

## 2018-10-21 LAB — LIPID PANEL W/O CHOL/HDL RATIO
Cholesterol, Total: 230 mg/dL — ABNORMAL HIGH (ref 100–199)
HDL: 42 mg/dL (ref >39)
LDL Calculated: 122 mg/dL — ABNORMAL HIGH (ref 0–99)
Triglycerides: 329 mg/dL — ABNORMAL HIGH (ref 0–149)
VLDL Cholesterol Cal: 66 mg/dL — ABNORMAL HIGH (ref 5–40)

## 2018-10-21 LAB — UA/M W/RFLX CULTURE, ROUTINE
Bilirubin, UA: NEGATIVE
Glucose, UA: NEGATIVE
Ketones, UA: NEGATIVE
Leukocytes,UA: NEGATIVE
Nitrite, UA: NEGATIVE
Protein,UA: NEGATIVE
RBC, UA: NEGATIVE
Specific Gravity, UA: 1.015 (ref 1.005–1.030)
Urobilinogen, Ur: 0.2 mg/dL (ref 0.2–1.0)
pH, UA: 7 (ref 5.0–7.5)

## 2018-10-21 LAB — HSV(HERPES SIMPLEX VRS) I + II AB-IGG
HSV 1 Glycoprotein G Ab, IgG: <0.91 index (ref 0.00–0.90)
HSV 2 IgG, Type Spec: <0.91 index (ref 0.00–0.90)

## 2018-10-21 LAB — HIV ANTIBODY (ROUTINE TESTING W REFLEX): HIV Screen 4th Generation wRfx: NONREACTIVE

## 2018-10-21 LAB — RPR: RPR Ser Ql: NONREACTIVE

## 2018-10-23 ENCOUNTER — Encounter: Payer: Self-pay | Admitting: Family Medicine

## 2018-10-23 DIAGNOSIS — F419 Anxiety disorder, unspecified: Secondary | ICD-10-CM | POA: Insufficient documentation

## 2018-10-23 NOTE — Assessment & Plan Note (Signed)
Stable and under good control, continue gabapentin regimen

## 2018-10-23 NOTE — Assessment & Plan Note (Signed)
Moods stable and under good control, continue current regimen 

## 2018-10-23 NOTE — Assessment & Plan Note (Signed)
Buspar prn seems to help, continue this in addition to latuda and lamictal for mood stabilization

## 2018-10-23 NOTE — Assessment & Plan Note (Signed)
Recheck lipids, adjust as needed. Lifestyle modifications reviewed 

## 2018-10-23 NOTE — Assessment & Plan Note (Signed)
Under good control, continue protonix prn

## 2018-10-25 LAB — GC/CHLAMYDIA PROBE AMP
Chlamydia trachomatis, NAA: NEGATIVE
Neisseria Gonorrhoeae by PCR: NEGATIVE

## 2018-10-30 ENCOUNTER — Encounter: Payer: Self-pay | Admitting: Family Medicine

## 2018-10-31 ENCOUNTER — Other Ambulatory Visit: Payer: Self-pay

## 2018-10-31 ENCOUNTER — Encounter: Payer: Self-pay | Admitting: Family Medicine

## 2018-10-31 ENCOUNTER — Ambulatory Visit (INDEPENDENT_AMBULATORY_CARE_PROVIDER_SITE_OTHER): Payer: 59 | Admitting: Family Medicine

## 2018-10-31 VITALS — Temp 99.8°F | Ht 68.0 in | Wt 160.0 lb

## 2018-10-31 DIAGNOSIS — R509 Fever, unspecified: Secondary | ICD-10-CM

## 2018-10-31 DIAGNOSIS — R52 Pain, unspecified: Secondary | ICD-10-CM

## 2018-10-31 DIAGNOSIS — Z20822 Contact with and (suspected) exposure to covid-19: Secondary | ICD-10-CM

## 2018-10-31 DIAGNOSIS — R059 Cough, unspecified: Secondary | ICD-10-CM

## 2018-10-31 DIAGNOSIS — R05 Cough: Secondary | ICD-10-CM

## 2018-10-31 NOTE — Progress Notes (Signed)
Temp 99.8 F (37.7 C) (Oral)   Ht 5\' 8"  (1.727 m)   Wt 160 lb (72.6 kg)   BMI 24.33 kg/m    Subjective:    Patient ID: Joel Carr, male    DOB: 10/02/1985, 33 y.o.   MRN: [id number]  HPI: Joel Carr is a 33 y.o. male  Chief Complaint  Patient presents with  . Generalized Body Aches    Patient went to the beach last week. Symptoms onging appx 2 days.  . Fever  . Shortness of Breath  . Headache    . This visit was completed via WebEx due to the restrictions of the COVID-19 pandemic. All issues as above were discussed and addressed. Physical exam was done as above through visual confirmation on WebEx. If it was felt that the patient should be evaluated in the office, they were directed there. The patient verbally consented to this visit. . Location of the patient: home . Location of the provider: home . Those involved with this call:  . Provider: Roosvelt Maserachel Rhoderick Farrel, PA-C . CMA: Myrtha MantisKeri Bullock, CMA . Front Desk/Registration: Harriet PhoJoliza Johnson  . Time spent on call: 25 minutes with patient face to face via video conference. More than 50% of this time was spent in counseling and coordination of care. 5 minutes total spent in review of patient's record and preparation of their chart. I verified patient identity using two factors (patient name and date of birth). Patient consents verbally to being seen via telemedicine visit today.   About 2 days now of fever, headaches, body aches, cough, diarrhea. Taking tylenol with no relief. No known sick contacts but did just get back from the beach. Denies CP, SOB, N/V, rashes. No hx of pulmonary dz.   Relevant past medical, surgical, family and social history reviewed and updated as indicated. Interim medical history since our last visit reviewed. Allergies and medications reviewed and updated.  Review of Systems  Per HPI unless specifically indicated above     Objective:    Temp 99.8 F (37.7 C) (Oral)   Ht 5\' 8"  (1.727 m)   Wt 160 lb  (72.6 kg)   BMI 24.33 kg/m   Wt Readings from Last 3 Encounters:  10/31/18 160 lb (72.6 kg)  10/20/18 160 lb (72.6 kg)  09/20/18 157 lb (71.2 kg)    Physical Exam Vitals signs and nursing note reviewed.  Constitutional:      General: He is not in acute distress.    Appearance: Normal appearance.  HENT:     Head: Atraumatic.     Right Ear: External ear normal.     Left Ear: External ear normal.     Nose: Congestion present.     Mouth/Throat:     Mouth: Mucous membranes are moist.     Pharynx: Oropharynx is clear. Posterior oropharyngeal erythema present.  Eyes:     Extraocular Movements: Extraocular movements intact.     Conjunctiva/sclera: Conjunctivae normal.  Neck:     Musculoskeletal: Normal range of motion.  Cardiovascular:     Rate and Rhythm: Normal rate and regular rhythm.  Pulmonary:     Effort: Pulmonary effort is normal. No respiratory distress.  Musculoskeletal: Normal range of motion.  Skin:    General: Skin is dry.     Findings: No erythema or rash.  Neurological:     Mental Status: He is oriented to person, place, and time.  Psychiatric:        Mood and Affect: Mood  normal.        Thought Content: Thought content normal.        Judgment: Judgment normal.     Results for orders placed or performed in visit on 10/20/18  GC/Chlamydia Probe Amp   Specimen: Urine   UR  Result Value Ref Range   Chlamydia trachomatis, NAA Negative Negative   Neisseria Gonorrhoeae by PCR Negative Negative  CBC with Differential/Platelet  Result Value Ref Range   WBC 7.8 3.4 - 10.8 x10E3/uL   RBC 5.08 4.14 - 5.80 x10E6/uL   Hemoglobin 15.5 13.0 - 17.7 g/dL   Hematocrit 45.7 37.5 - 51.0 %   MCV 90 79 - 97 fL   MCH 30.5 26.6 - 33.0 pg   MCHC 33.9 31.5 - 35.7 g/dL   RDW 13.2 11.6 - 15.4 %   Platelets 221 150 - 450 x10E3/uL   Neutrophils 57 Not Estab. %   Lymphs 36 Not Estab. %   Monocytes 5 Not Estab. %   Eos 1 Not Estab. %   Basos 0 Not Estab. %   Neutrophils  Absolute 4.4 1.4 - 7.0 x10E3/uL   Lymphocytes Absolute 2.8 0.7 - 3.1 x10E3/uL   Monocytes Absolute 0.4 0.1 - 0.9 x10E3/uL   EOS (ABSOLUTE) 0.1 0.0 - 0.4 x10E3/uL   Basophils Absolute 0.0 0.0 - 0.2 x10E3/uL   Immature Granulocytes 1 Not Estab. %   Immature Grans (Abs) 0.0 0.0 - 0.1 x10E3/uL  Comprehensive metabolic panel  Result Value Ref Range   Glucose 99 65 - 99 mg/dL   BUN 7 6 - 20 mg/dL   Creatinine, Ser 0.79 0.76 - 1.27 mg/dL   GFR calc non Af Amer 117 >59 mL/min/1.73   GFR calc Af Amer 135 >59 mL/min/1.73   BUN/Creatinine Ratio 9 9 - 20   Sodium 142 134 - 144 mmol/L   Potassium 4.0 3.5 - 5.2 mmol/L   Chloride 102 96 - 106 mmol/L   CO2 25 20 - 29 mmol/L   Calcium 9.5 8.7 - 10.2 mg/dL   Total Protein 6.2 6.0 - 8.5 g/dL   Albumin 4.8 4.0 - 5.0 g/dL   Globulin, Total 1.4 (L) 1.5 - 4.5 g/dL   Albumin/Globulin Ratio 3.4 (H) 1.2 - 2.2   Bilirubin Total 0.2 0.0 - 1.2 mg/dL   Alkaline Phosphatase 82 39 - 117 IU/L   AST 22 0 - 40 IU/L   ALT 25 0 - 44 IU/L  Lipid Panel w/o Chol/HDL Ratio  Result Value Ref Range   Cholesterol, Total 230 (H) 100 - 199 mg/dL   Triglycerides 329 (H) 0 - 149 mg/dL   HDL 42 >39 mg/dL   VLDL Cholesterol Cal 66 (H) 5 - 40 mg/dL   LDL Calculated 122 (H) 0 - 99 mg/dL  UA/M w/rflx Culture, Routine   Specimen: Urine   URINE  Result Value Ref Range   Specific Gravity, UA 1.015 1.005 - 1.030   pH, UA 7.0 5.0 - 7.5   Color, UA Yellow Yellow   Appearance Ur Clear Clear   Leukocytes,UA Negative Negative   Protein,UA Negative Negative/Trace   Glucose, UA Negative Negative   Ketones, UA Negative Negative   RBC, UA Negative Negative   Bilirubin, UA Negative Negative   Urobilinogen, Ur 0.2 0.2 - 1.0 mg/dL   Nitrite, UA Negative Negative  HIV Antibody (routine testing w rflx)  Result Value Ref Range   HIV Screen 4th Generation wRfx Non Reactive Non Reactive  RPR  Result Value Ref  Range   RPR Ser Ql Non Reactive Non Reactive  HSV(herpes simplex vrs) 1+2  ab-IgG  Result Value Ref Range   HSV 1 Glycoprotein G Ab, IgG <0.91 0.00 - 0.90 index   HSV 2 IgG, Type Spec <0.91 0.00 - 0.90 index      Assessment & Plan:   Problem List Items Addressed This Visit    None    Visit Diagnoses    Fever, unspecified fever cause    -  Primary   Cough       Generalized body aches        Given risk factor of recent travel and suspicious sxs, will refer for COVID 19 testing and place on strict home quarantine until results return and sxs improved. Supportive home care reviewed with OTC pain relievers/fever reducers, decongestants, rest, hydration. Return precautions reviewed at length. Letter provided for work stating he is under quarantine until further notice.    Follow up plan: Return if symptoms worsen or fail to improve.

## 2018-11-01 ENCOUNTER — Encounter: Payer: Self-pay | Admitting: Family Medicine

## 2018-11-01 LAB — NOVEL CORONAVIRUS, NAA: SARS-CoV-2, NAA: NOT DETECTED

## 2018-11-02 ENCOUNTER — Encounter: Payer: Self-pay | Admitting: Family Medicine

## 2018-11-02 ENCOUNTER — Other Ambulatory Visit: Payer: Self-pay | Admitting: Family Medicine

## 2018-11-02 MED ORDER — AZITHROMYCIN 250 MG PO TABS: ORAL_TABLET | ORAL | 0 refills | Status: DC

## 2018-11-06 ENCOUNTER — Other Ambulatory Visit: Payer: Self-pay | Admitting: Family Medicine

## 2018-11-15 ENCOUNTER — Encounter: Payer: Self-pay | Admitting: Family Medicine

## 2018-11-16 ENCOUNTER — Encounter: Payer: Self-pay | Admitting: Family Medicine

## 2018-11-16 ENCOUNTER — Other Ambulatory Visit: Payer: Self-pay

## 2018-11-16 ENCOUNTER — Ambulatory Visit (INDEPENDENT_AMBULATORY_CARE_PROVIDER_SITE_OTHER): Payer: 59 | Admitting: Family Medicine

## 2018-11-16 DIAGNOSIS — G4700 Insomnia, unspecified: Secondary | ICD-10-CM | POA: Diagnosis not present

## 2018-11-16 DIAGNOSIS — M7541 Impingement syndrome of right shoulder: Secondary | ICD-10-CM | POA: Diagnosis not present

## 2018-11-16 DIAGNOSIS — F3131 Bipolar disorder, current episode depressed, mild: Secondary | ICD-10-CM

## 2018-11-16 MED ORDER — MELOXICAM 15 MG PO TABS: 15.0000 mg | ORAL_TABLET | Freq: Every day | ORAL | 3 refills | Status: DC

## 2018-11-16 NOTE — Assessment & Plan Note (Signed)
The current medical regimen is effective;  continue present plan and medications.  

## 2018-11-16 NOTE — Assessment & Plan Note (Signed)
Reviewed shoulder pain will start meloxicam if not getting better will need to be seen.

## 2018-11-16 NOTE — Progress Notes (Signed)
   There were no vitals taken for this visit.   Subjective:    Patient ID: Joel Carr, Joel Carr    DOB: 08-18-1985, 33 y.o.   MRN: [id number]  HPI: Joel Carr is a 33 y.o. Joel Carr  shoulder pain  Patient with right shoulder pain ongoing for 1 week no known trauma or irritation but is been working very hard at work and does a lot of lifting pulling type activity. Will occasionally wake him from sleep.  Has tried some over-the-counter Advil Aleve with minimal help. Nerves are doing really well with increased dose of Latuda. URI from earlier in the month has resolved with Z-Pak.  COVID-19 testing was negative.  Relevant past medical, surgical, family and social history reviewed and updated as indicated. Interim medical history since our last visit reviewed. Allergies and medications reviewed and updated.  Review of Systems  Constitutional: Negative.   Respiratory: Negative.   Cardiovascular: Negative.     Per HPI unless specifically indicated above     Objective:    There were no vitals taken for this visit.  Wt Readings from Last 3 Encounters:  10/31/18 160 lb (72.6 kg)  10/20/18 160 lb (72.6 kg)  09/20/18 157 lb (71.2 kg)    Physical Exam  Results for orders placed or performed in visit on 10/31/18  Novel Coronavirus, NAA ([facility])   Specimen: Oropharyngeal(OP) collection in vial transport medium   OROPHARYNGEA  TESTING  Result Value Ref Range   SARS-CoV-2, NAA Not Detected Not Detected      Assessment & Plan:   Problem List Items Addressed This Visit      Other   Bipolar disorder ([place])    The current medical regimen is effective;  continue present plan and medications.       Impingement syndrome of shoulder region    Reviewed shoulder pain will start meloxicam if not getting better will need to be seen.      Insomnia    The current medical regimen is effective;  continue present plan and medications.            Telemedicine using audio/video  telecommunications for a synchronous communication visit. Today's visit due to COVID-19 isolation precautions I connected with and verified that I am speaking with the correct person using two identifiers.   I discussed the limitations, risks, security and privacy concerns of performing an evaluation and management service by telecommunication and the availability of in person appointments. I also discussed with the patient that there may be a patient responsible charge related to this service. The patient expressed understanding and agreed to proceed. The patient's location is work. I am at home.   I discussed the assessment and treatment plan with the patient. The patient was provided an opportunity to ask questions and all were answered. The patient agreed with the plan and demonstrated an understanding of the instructions.   The patient was advised to call back or seek an in-person evaluation if the symptoms worsen or if the condition fails to improve as anticipated.   I provided 21+ minutes of time during this encounter. Follow up plan: Return if symptoms worsen or fail to improve.

## 2018-11-27 IMAGING — US US ABDOMEN LIMITED
1 series · 10 of 10 positions shown · non-contrast
Comparison: None.

CLINICAL DATA: 32-year-old male with a history of palpable nodule
of the abdominal wall.

EXAM:
ULTRASOUND ABDOMEN LIMITED

[Series 1: us abdomen limited · 0.06mm/px · 10 acquisitions, 10 frames shown]
[im 1/10]
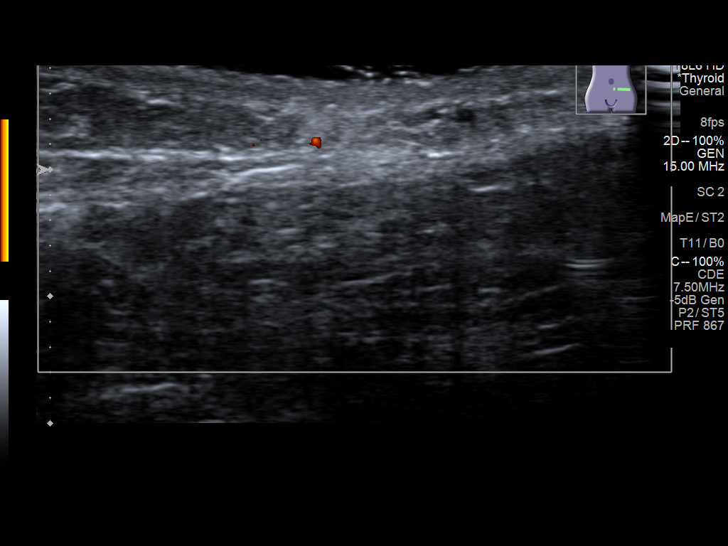
[im 2/10]
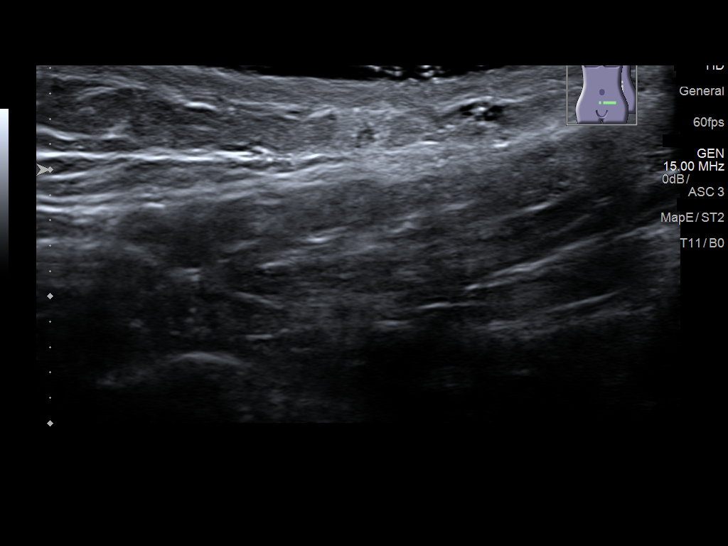
[im 3/10]
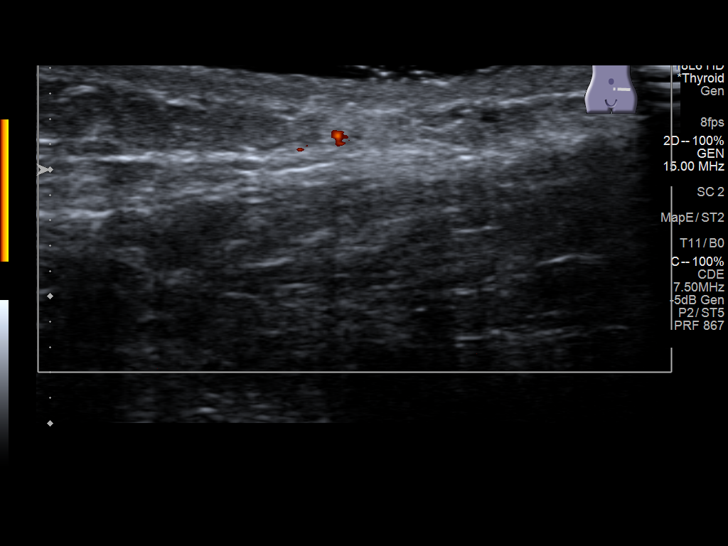
[im 4/10]
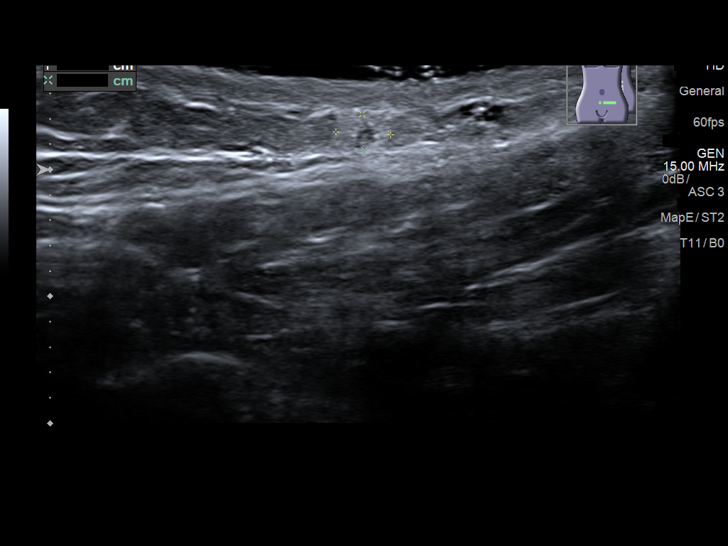
[im 5/10]
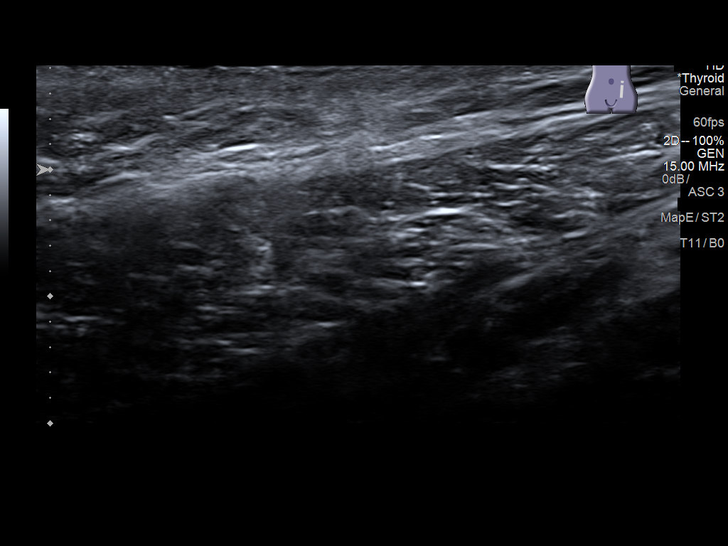
[im 6/10]
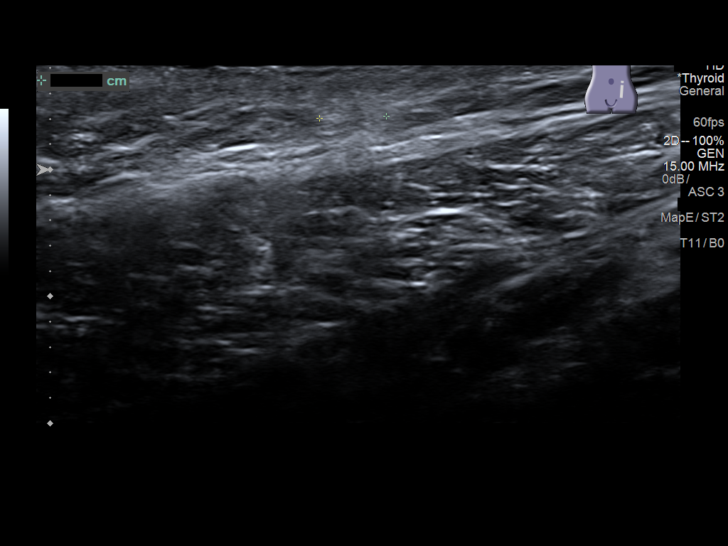
[im 7/10]
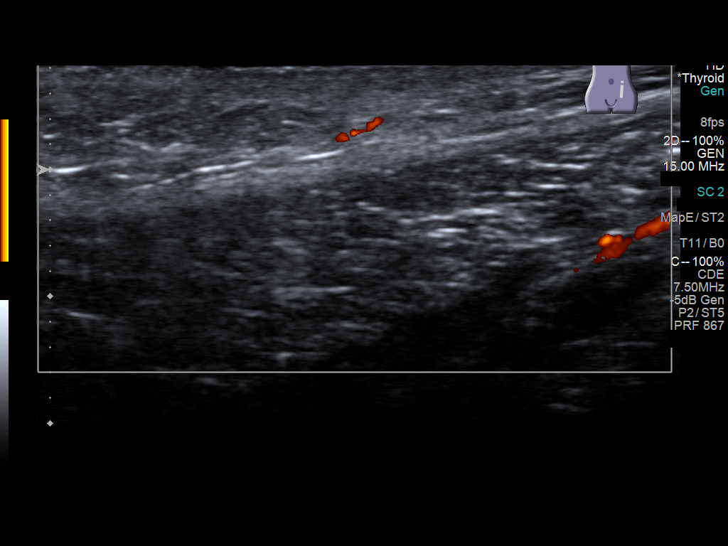
[im 8/10]
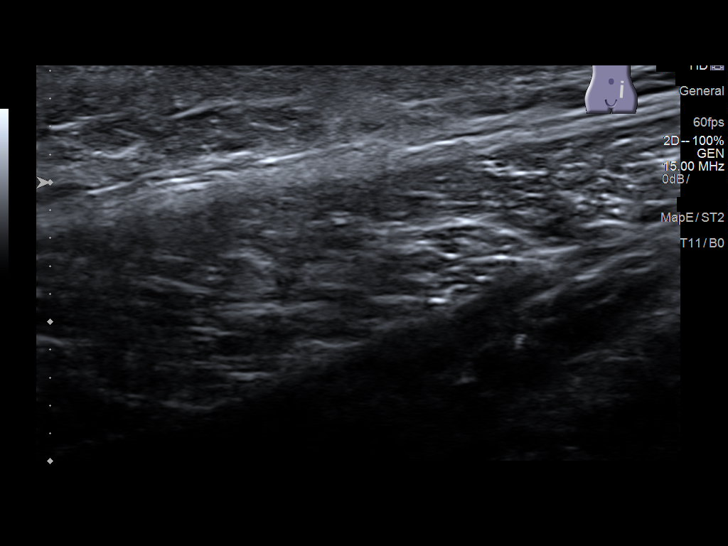
[im 9/10]
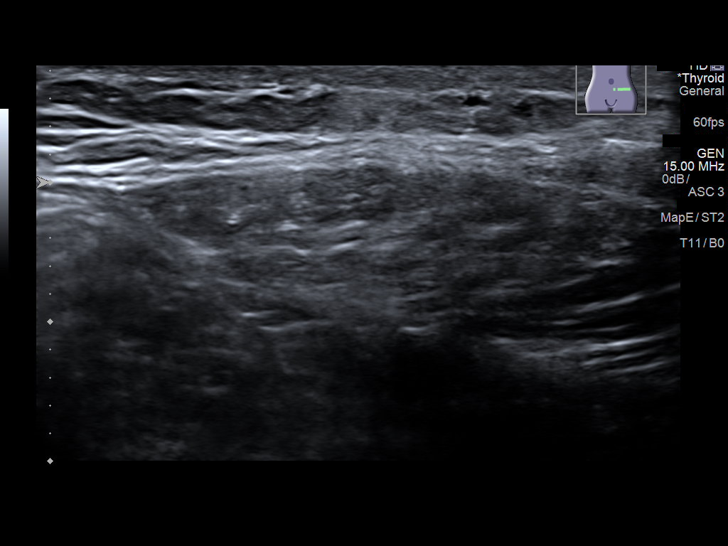
[im 10/10]
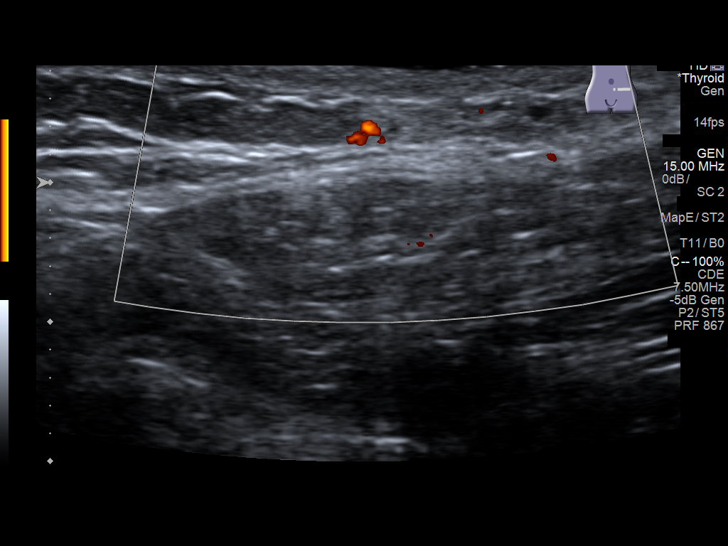

[10 of 10 positions shown; findings below may reference images not displayed]

FINDINGS: Grayscale and color duplex ultrasound performed. There is a small
heterogeneously echoic soft tissue focus measuring 4 mm within the
subcutaneous tissues with questionable vascularity. No focal fluid
collection.

This does not have the typical architecture of a lymph node.
IMPRESSION: Nonspecific 4 mm soft tissue focus in the region of clinical
concern. Ultrasound is nondiagnostic for the etiology.

## 2018-11-29 ENCOUNTER — Encounter: Payer: Self-pay | Admitting: Family Medicine

## 2018-11-30 ENCOUNTER — Other Ambulatory Visit: Payer: Self-pay | Admitting: Family Medicine

## 2018-11-30 MED ORDER — ALBUTEROL SULFATE HFA 108 (90 BASE) MCG/ACT IN AERS: 2.0000 | INHALATION_SPRAY | Freq: Four times a day (QID) | RESPIRATORY_TRACT | 0 refills | Status: DC | PRN

## 2018-12-01 ENCOUNTER — Other Ambulatory Visit: Payer: Self-pay | Admitting: Family Medicine

## 2018-12-01 ENCOUNTER — Encounter: Payer: Self-pay | Admitting: Family Medicine

## 2018-12-01 MED ORDER — BUSPIRONE HCL 10 MG PO TABS: 10.0000 mg | ORAL_TABLET | Freq: Three times a day (TID) | ORAL | 0 refills | Status: DC | PRN

## 2018-12-26 ENCOUNTER — Ambulatory Visit (INDEPENDENT_AMBULATORY_CARE_PROVIDER_SITE_OTHER): Payer: 59 | Admitting: Family Medicine

## 2018-12-26 ENCOUNTER — Encounter: Payer: Self-pay | Admitting: Family Medicine

## 2018-12-26 ENCOUNTER — Other Ambulatory Visit: Payer: Self-pay

## 2018-12-26 VITALS — Ht 68.0 in | Wt 158.0 lb

## 2018-12-26 DIAGNOSIS — R6889 Other general symptoms and signs: Secondary | ICD-10-CM | POA: Diagnosis not present

## 2018-12-26 DIAGNOSIS — Z20822 Contact with and (suspected) exposure to covid-19: Secondary | ICD-10-CM

## 2018-12-26 MED ORDER — AMOXICILLIN-POT CLAVULANATE 875-125 MG PO TABS: 1.0000 | ORAL_TABLET | Freq: Two times a day (BID) | ORAL | 0 refills | Status: DC

## 2018-12-26 NOTE — Progress Notes (Signed)
Ht 5\' 8"  (1.727 m)   Wt 158 lb (71.7 kg)   BMI 24.02 kg/m    Subjective:    Patient ID: , male    DOB: [birth year], 33 y.o.   MRN: [id number]  HPI: Joel Carr is a 33 y.o. male  Chief Complaint  Patient presents with  . Headache    Patient's father's coworker tested positive for COVID19. Patient symptoms ongoing 3 days.  . Generalized Body Aches  . Fatigue  . Cough  . Diarrhea  . Loss of taste  . Emesis    . This visit was completed via WebEx due to the restrictions of the COVID-19 pandemic. All issues as above were discussed and addressed. Physical exam was done as above through visual confirmation on WebEx. If it was felt that the patient should be evaluated in the office, they were directed there. The patient verbally consented to this visit. . Location of the patient: in parked car . Location of the provider: home . Those involved with this call:  . Provider: 20, PA-C . CMA: Roosvelt Maser, CMA . Front Desk/Registration: Myrtha Mantis  . Time spent on call: 25 minutes with patient face to face via video conference. More than 50% of this time was spent in counseling and coordination of care. 5 minutes total spent in review of patient's record and preparation of their chart. I verified patient identity using two factors (patient name and date of birth). Patient consents verbally to being seen via telemedicine visit today.   Headache, generalized myalgias, fatigue, N/V/D, congestion, cough, loss of taste x 3 days. Denies fever, CP, SOB, sore throat. Just got back into town from [place] this past week, and was around father almost 2 weeks ago who was positive for COVID 19. Has not been trying anything OTC for sxs.   Relevant past medical, surgical, family and social history reviewed and updated as indicated. Interim medical history since our last visit reviewed. Allergies and medications reviewed and updated.  Review of Systems  Per HPI unless specifically  indicated above     Objective:    Ht 5\' 8"  (1.727 m)   Wt 158 lb (71.7 kg)   BMI 24.02 kg/m   Wt Readings from Last 3 Encounters:  12/26/18 158 lb (71.7 kg)  10/31/18 160 lb (72.6 kg)  10/20/18 160 lb (72.6 kg)    Physical Exam Vitals signs and nursing note reviewed.  Constitutional:      General: He is not in acute distress.    Appearance: Normal appearance.  HENT:     Head: Atraumatic.     Right Ear: External ear normal.     Left Ear: External ear normal.     Nose: Congestion present.     Mouth/Throat:     Mouth: Mucous membranes are moist.     Pharynx: Oropharynx is clear. Posterior oropharyngeal erythema present.  Eyes:     Extraocular Movements: Extraocular movements intact.     Conjunctiva/sclera: Conjunctivae normal.  Neck:     Musculoskeletal: Normal range of motion.  Cardiovascular:     Rate and Rhythm: Normal rate and regular rhythm.  Pulmonary:     Effort: Pulmonary effort is normal. No respiratory distress.  Musculoskeletal: Normal range of motion.  Skin:    General: Skin is dry.     Findings: No erythema or rash.  Neurological:     Mental Status: He is oriented to person, place, and time.  Psychiatric:  Mood and Affect: Mood normal.        Thought Content: Thought content normal.        Judgment: Judgment normal.     Results for orders placed or performed in visit on 10/31/18  Novel Coronavirus, NAA ([facility])   Specimen: Oropharyngeal(OP) collection in vial transport medium   OROPHARYNGEA  TESTING  Result Value Ref Range   SARS-CoV-2, NAA Not Detected Not Detected      Assessment & Plan:   Problem List Items Addressed This Visit    None    Visit Diagnoses    Suspected Covid-19 Virus Infection    -  Primary   Sxs and exposures consistent, will refer for COVID testing, quarantine. Supportive care discussed. Abx sent in case not improving by end of week   Relevant Orders   Novel Coronavirus, NAA ([facility])       Follow up plan:  Return if symptoms worsen or fail to improve.

## 2018-12-27 LAB — NOVEL CORONAVIRUS, NAA: SARS-CoV-2, NAA: NOT DETECTED

## 2019-02-01 ENCOUNTER — Other Ambulatory Visit: Payer: Self-pay

## 2019-02-02 ENCOUNTER — Ambulatory Visit (INDEPENDENT_AMBULATORY_CARE_PROVIDER_SITE_OTHER): Payer: 59 | Admitting: Family Medicine

## 2019-02-02 ENCOUNTER — Encounter: Payer: Self-pay | Admitting: Family Medicine

## 2019-02-02 VITALS — BP 117/75 | HR 74 | Temp 98.2°F

## 2019-02-02 DIAGNOSIS — M545 Low back pain, unspecified: Secondary | ICD-10-CM

## 2019-02-02 DIAGNOSIS — R109 Unspecified abdominal pain: Secondary | ICD-10-CM | POA: Diagnosis not present

## 2019-02-02 LAB — UA/M W/RFLX CULTURE, ROUTINE
Bilirubin, UA: NEGATIVE
Glucose, UA: NEGATIVE
Ketones, UA: NEGATIVE
Leukocytes,UA: NEGATIVE
Nitrite, UA: NEGATIVE
Protein,UA: NEGATIVE
RBC, UA: NEGATIVE
Specific Gravity, UA: 1.02 (ref 1.005–1.030)
Urobilinogen, Ur: 0.2 mg/dL (ref 0.2–1.0)
pH, UA: 7.5 (ref 5.0–7.5)

## 2019-02-02 LAB — CBC WITH DIFFERENTIAL/PLATELET
Hematocrit: 42.8 % (ref 37.5–51.0)
Hemoglobin: 15.6 g/dL (ref 13.0–17.7)
Lymphocytes Absolute: 2.5 10*3/uL (ref 0.7–3.1)
Lymphs: 40 %
MCH: 32.5 pg (ref 26.6–33.0)
MCHC: 36.4 g/dL — ABNORMAL HIGH (ref 31.5–35.7)
MCV: 89 fL (ref 79–97)
MID (Absolute): 0.4 10*3/uL (ref 0.1–1.6)
MID: 7 %
Neutrophils Absolute: 3.4 10*3/uL (ref 1.4–7.0)
Neutrophils: 53 %
Platelets: 200 10*3/uL (ref 150–450)
RBC: 4.8 x10E6/uL (ref 4.14–5.80)
RDW: 13 % (ref 11.6–15.4)
WBC: 6.3 10*3/uL (ref 3.4–10.8)

## 2019-02-02 MED ORDER — TAMSULOSIN HCL 0.4 MG PO CAPS: 0.4000 mg | ORAL_CAPSULE | Freq: Every day | ORAL | 0 refills | Status: DC

## 2019-02-02 MED ORDER — HYDROCODONE-ACETAMINOPHEN 5-325 MG PO TABS: 1.0000 | ORAL_TABLET | Freq: Four times a day (QID) | ORAL | 0 refills | Status: DC | PRN

## 2019-02-02 NOTE — Progress Notes (Signed)
BP 117/75   Pulse 74   Temp 98.2 F (36.8 C) (Oral)   SpO2 98%    Subjective:    Patient ID: Joel Carr, male    DOB: 05-10-1985, 33 y.o.   MRN: [id number]  HPI: Joel Carr is a 33 y.o. male  Chief Complaint  Patient presents with  . Urinary Tract Infection    pt states he has been having low back pain, hematuria and trouble urinating since Tuesday morning   Started 5 days ago overnight with significant sharp left flank pain, difficulty urinating, and some intermittent blood in the urine. Some nausea but no vomiting, fevers, abdominal pain, diarrhea. Trying tylenol which helps temporarily. No hx of kidney [place], UTI, back injuries, no concern for STIs.   Relevant past medical, surgical, family and social history reviewed and updated as indicated. Interim medical history since our last visit reviewed. Allergies and medications reviewed and updated.  Review of Systems  Per HPI unless specifically indicated above     Objective:    BP 117/75   Pulse 74   Temp 98.2 F (36.8 C) (Oral)   SpO2 98%   Wt Readings from Last 3 Encounters:  12/26/18 158 lb (71.7 kg)  10/31/18 160 lb (72.6 kg)  10/20/18 160 lb (72.6 kg)    Physical Exam Vitals signs and nursing note reviewed.  Constitutional:      Appearance: Normal appearance.  HENT:     Head: Atraumatic.  Eyes:     Extraocular Movements: Extraocular movements intact.     Conjunctiva/sclera: Conjunctivae normal.  Neck:     Musculoskeletal: Normal range of motion and neck supple.  Cardiovascular:     Rate and Rhythm: Normal rate and regular rhythm.  Pulmonary:     Effort: Pulmonary effort is normal.     Breath sounds: Normal breath sounds.  Abdominal:     General: Bowel sounds are normal. There is no distension.     Palpations: Abdomen is soft. There is no mass.     Tenderness: There is abdominal tenderness (mild ttp left mid abdomen). There is left CVA tenderness. There is no right CVA tenderness, guarding or  rebound.  Musculoskeletal: Normal range of motion.  Skin:    General: Skin is warm and dry.  Neurological:     General: No focal deficit present.     Mental Status: He is oriented to person, place, and time.  Psychiatric:        Mood and Affect: Mood normal.        Thought Content: Thought content normal.        Judgment: Judgment normal.     Results for orders placed or performed in visit on 12/26/18  Novel Coronavirus, NAA ([facility])   Specimen: Oropharyngeal(OP) collection in vial transport medium   OROPHARYNGEA  TESTING  Result Value Ref Range   SARS-CoV-2, NAA Not Detected Not Detected      Assessment & Plan:   Problem List Items Addressed This Visit    None    Visit Diagnoses    Flank pain    -  Primary   Suspect kidney [place], but will check CBC and BMP, obtain abdominal x-ray (pt wanting to avoid CT radiation). Flomax, push fluids, hydrocodone prn   Relevant Orders   CBC With Differential/Platelet here   DG Abd 2 Views   Basic metabolic panel   Acute bilateral low back pain, unspecified whether sciatica present       Relevant Medications  HYDROcodone-acetaminophen (NORCO/VICODIN) 5-325 MG tablet   Other Relevant Orders   UA/M w/rflx Culture, Routine-H       Follow up plan: Return if symptoms worsen or fail to improve.

## 2019-02-02 NOTE — Patient Instructions (Signed)
[facility] [address], , [place] [id number] 

## 2019-02-03 LAB — BASIC METABOLIC PANEL
BUN/Creatinine Ratio: 13 (ref 9–20)
BUN: 9 mg/dL (ref 6–20)
CO2: 27 mmol/L (ref 20–29)
Calcium: 9.6 mg/dL (ref 8.7–10.2)
Chloride: 102 mmol/L (ref 96–106)
Creatinine, Ser: 0.7 mg/dL — ABNORMAL LOW (ref 0.76–1.27)
GFR calc Af Amer: 142 mL/min/{1.73_m2} (ref >59)
GFR calc non Af Amer: 123 mL/min/{1.73_m2} (ref >59)
Glucose: 89 mg/dL (ref 65–99)
Potassium: 3.9 mmol/L (ref 3.5–5.2)
Sodium: 142 mmol/L (ref 134–144)

## 2019-02-08 NOTE — Telephone Encounter (Signed)
Called pt to set up appt, no answer, left vm °

## 2019-02-13 ENCOUNTER — Encounter: Payer: Self-pay | Admitting: Family Medicine

## 2019-02-14 NOTE — Telephone Encounter (Signed)
LVM for pt to call back.

## 2019-02-15 ENCOUNTER — Ambulatory Visit
Admission: RE | Admit: 2019-02-15 | Discharge: 2019-02-15 | Disposition: A | Discharge status: Home / Self Care | Payer: No Typology Code available for payment source | Attending: *Deleted | Admitting: *Deleted

## 2019-02-15 ENCOUNTER — Ambulatory Visit
Admission: RE | Admit: 2019-02-15 | Discharge: 2019-02-15 | Disposition: A | Discharge status: Home / Self Care | Payer: 59 | Source: Ambulatory Visit | Attending: Family Medicine | Admitting: Family Medicine

## 2019-02-15 ENCOUNTER — Other Ambulatory Visit: Payer: Self-pay | Admitting: Family Medicine

## 2019-02-15 ENCOUNTER — Other Ambulatory Visit: Payer: Self-pay

## 2019-02-15 DIAGNOSIS — M79671 Pain in right foot: Secondary | ICD-10-CM | POA: Insufficient documentation

## 2019-02-15 DIAGNOSIS — R109 Unspecified abdominal pain: Secondary | ICD-10-CM

## 2019-02-21 ENCOUNTER — Encounter: Payer: Self-pay | Admitting: Family Medicine

## 2019-02-23 ENCOUNTER — Encounter: Payer: Self-pay | Admitting: Family Medicine

## 2019-02-28 ENCOUNTER — Other Ambulatory Visit: Payer: Self-pay | Admitting: Family Medicine

## 2019-03-17 ENCOUNTER — Encounter: Payer: Self-pay | Admitting: Family Medicine

## 2019-03-22 ENCOUNTER — Ambulatory Visit (INDEPENDENT_AMBULATORY_CARE_PROVIDER_SITE_OTHER): Payer: 59 | Admitting: Family Medicine

## 2019-03-22 ENCOUNTER — Other Ambulatory Visit: Payer: Self-pay

## 2019-03-22 ENCOUNTER — Encounter: Payer: Self-pay | Admitting: Family Medicine

## 2019-03-22 VITALS — Ht 68.0 in | Wt 149.0 lb

## 2019-03-22 DIAGNOSIS — G4762 Sleep related leg cramps: Secondary | ICD-10-CM | POA: Diagnosis not present

## 2019-03-22 MED ORDER — CYCLOBENZAPRINE HCL 10 MG PO TABS: 10.0000 mg | ORAL_TABLET | Freq: Every evening | ORAL | 0 refills | Status: DC | PRN

## 2019-03-22 NOTE — Progress Notes (Signed)
Ht 5\' 8"  (1.727 m)   Wt 149 lb (67.6 kg)   BMI 22.66 kg/m    Subjective:    Patient ID: Joel Carr, male    DOB: November 12, 1985, 33 y.o.   MRN: [id number]  HPI: Joel Carr is a 33 y.o. male  Chief Complaint  Patient presents with  . Leg Pain    bilateral legs cramps at night x a week    . This visit was completed via WebEx due to the restrictions of the COVID-19 pandemic. All issues as above were discussed and addressed. Physical exam was done as above through visual confirmation on WebEx. If it was felt that the patient should be evaluated in the office, they were directed there. The patient verbally consented to this visit. . Location of the patient: home . Location of the provider: work . Those involved with this call:  . Provider: Merrie Roof, PA-C . CMA: Lesle Chris, [place] . Front Desk/Registration: Jill Side  . Time spent on call: 15 minutes with patient face to face via video conference. More than 50% of this time was spent in counseling and coordination of care. 5 minutes total spent in review of patient's record and preparation of their chart. I verified patient identity using two factors (patient name and date of birth). Patient consents verbally to being seen via telemedicine visit today.   Patient presenting today with about a week of nighttime leg aches and cramps. States this came out of the blue, no recent change in activity, hydration, diet, medications. Describes the pain as burning and aching, but not numbness or weakness. Does have known hx of RLS, on gabapentin which does help with that. Has tried increasing by 1 capsule at bedtime but states this didn't help much. No other associated sxs, including fevers, headaches, N/V/D, abdominal pain.   Relevant past medical, surgical, family and social history reviewed and updated as indicated. Interim medical history since our last visit reviewed. Allergies and medications reviewed and updated.  Review of  Systems  Per HPI unless specifically indicated above     Objective:    Ht 5\' 8"  (1.727 m)   Wt 149 lb (67.6 kg)   BMI 22.66 kg/m   Wt Readings from Last 3 Encounters:  03/22/19 149 lb (67.6 kg)  12/26/18 158 lb (71.7 kg)  10/31/18 160 lb (72.6 kg)    Physical Exam Vitals and nursing note reviewed.  Constitutional:      General: He is not in acute distress.    Appearance: Normal appearance.  HENT:     Head: Atraumatic.     Right Ear: External ear normal.     Left Ear: External ear normal.     Nose: Nose normal. No congestion.     Mouth/Throat:     Mouth: Mucous membranes are moist.     Pharynx: Oropharynx is clear.  Eyes:     Extraocular Movements: Extraocular movements intact.     Conjunctiva/sclera: Conjunctivae normal.  Pulmonary:     Effort: Pulmonary effort is normal. No respiratory distress.  Musculoskeletal:        General: No swelling. Normal range of motion.     Cervical back: Normal range of motion.  Skin:    General: Skin is dry.     Findings: No erythema or rash.  Neurological:     Mental Status: He is oriented to person, place, and time.  Psychiatric:        Mood and Affect: Mood normal.  Thought Content: Thought content normal.        Judgment: Judgment normal.     Results for orders placed or performed in visit on 02/02/19  UA/M w/rflx Culture, Routine-H   Specimen: Urine   URINE  Result Value Ref Range   Specific Gravity, UA 1.020 1.005 - 1.030   pH, UA 7.5 5.0 - 7.5   Color, UA Yellow Yellow   Appearance Ur Cloudy (A) Clear   Leukocytes,UA Negative Negative   Protein,UA Negative Negative/Trace   Glucose, UA Negative Negative   Ketones, UA Negative Negative   RBC, UA Negative Negative   Bilirubin, UA Negative Negative   Urobilinogen, Ur 0.2 0.2 - 1.0 mg/dL   Nitrite, UA Negative Negative  CBC With Differential/Platelet here  Result Value Ref Range   WBC 6.3 3.4 - 10.8 x10E3/uL   RBC 4.80 4.14 - 5.80 x10E6/uL   Hemoglobin 15.6  13.0 - 17.7 g/dL   Hematocrit 32.6 71.2 - 51.0 %   MCV 89 79 - 97 fL   MCH 32.5 26.6 - 33.0 pg   MCHC 36.4 (H) 31.5 - 35.7 g/dL   RDW 45.8 09.9 - 83.3 %   Platelets 200 150 - 450 x10E3/uL   Neutrophils 53 Not Estab. %   Lymphs 40 Not Estab. %   MID 7 Not Estab. %   Neutrophils Absolute 3.4 1.4 - 7.0 x10E3/uL   Lymphocytes Absolute 2.5 0.7 - 3.1 x10E3/uL   MID (Absolute) 0.4 0.1 - 1.6 X10E3/uL  Basic metabolic panel  Result Value Ref Range   Glucose 89 65 - 99 mg/dL   BUN 9 6 - 20 mg/dL   Creatinine, Ser 8.25 (L) 0.76 - 1.27 mg/dL   GFR calc non Af Amer 123 >59 mL/min/1.73   GFR calc Af Amer 142 >59 mL/min/1.73   BUN/Creatinine Ratio 13 9 - 20   Sodium 142 134 - 144 mmol/L   Potassium 3.9 3.5 - 5.2 mmol/L   Chloride 102 96 - 106 mmol/L   CO2 27 20 - 29 mmol/L   Calcium 9.6 8.7 - 10.2 mg/dL      Assessment & Plan:   Problem List Items Addressed This Visit    None    Visit Diagnoses    Nocturnal leg cramps    -  Primary   Recent labs WNL, but possibly vit deficiency, hydration issue. Increase fluids, take good multivitamin, stretch. Flexeril and gabapentin prn at bedtime       Follow up plan: Return if symptoms worsen or fail to improve.

## 2019-03-26 ENCOUNTER — Encounter: Payer: Self-pay | Admitting: Family Medicine

## 2019-03-29 ENCOUNTER — Ambulatory Visit (INDEPENDENT_AMBULATORY_CARE_PROVIDER_SITE_OTHER): Payer: 59 | Admitting: Family Medicine

## 2019-03-29 ENCOUNTER — Other Ambulatory Visit: Payer: Self-pay

## 2019-03-29 ENCOUNTER — Encounter: Payer: Self-pay | Admitting: Family Medicine

## 2019-03-29 VITALS — BP 115/74 | HR 72 | Temp 98.4°F | Ht 68.0 in | Wt 148.0 lb

## 2019-03-29 DIAGNOSIS — F419 Anxiety disorder, unspecified: Secondary | ICD-10-CM | POA: Diagnosis not present

## 2019-03-29 DIAGNOSIS — F3131 Bipolar disorder, current episode depressed, mild: Secondary | ICD-10-CM | POA: Diagnosis not present

## 2019-03-29 MED ORDER — VENLAFAXINE HCL ER 75 MG PO CP24: 75.0000 mg | ORAL_CAPSULE | Freq: Every day | ORAL | 0 refills | Status: DC

## 2019-03-29 MED ORDER — LAMOTRIGINE 100 MG PO TABS: 100.0000 mg | ORAL_TABLET | Freq: Two times a day (BID) | ORAL | 1 refills | Status: DC

## 2019-03-29 MED ORDER — CYCLOBENZAPRINE HCL 10 MG PO TABS: 10.0000 mg | ORAL_TABLET | Freq: Every evening | ORAL | 0 refills | Status: DC | PRN

## 2019-03-29 MED ORDER — LURASIDONE HCL 40 MG PO TABS: 40.0000 mg | ORAL_TABLET | Freq: Every day | ORAL | 1 refills | Status: DC

## 2019-03-29 MED ORDER — GABAPENTIN 300 MG PO CAPS: 600.0000 mg | ORAL_CAPSULE | Freq: Two times a day (BID) | ORAL | 1 refills | Status: DC | PRN

## 2019-03-29 NOTE — Progress Notes (Signed)
BP 115/74   Pulse 72   Temp 98.4 F (36.9 C) (Oral)   Ht 5\' 8"  (1.727 m)   Wt 148 lb (67.1 kg)   SpO2 99%   BMI 22.50 kg/m    Subjective:    Patient ID: , male    DOB: Sep 13, 1985, 33 y.o.   MRN: [id number]  HPI: Joel Carr is a 33 y.o. male  Chief Complaint  Patient presents with  . Anxiety    pt states he has been more irritated lately. states his burpar is not working well   Patient here today to discuss worsening anxiety sxs. Was taking the buspar about twice daily as needed and notes minimal improvement in his anxiousness and agitation. Has been on lamictal and latuda for a while now and feels like those work well for his depression and mood swings, now just worried about his anxiety. Denies SI/HI.  Depression screen [facility] 2/9 03/29/2019 09/20/2018 07/07/2018  Decreased Interest 1 0 0  Down, Depressed, Hopeless 0 0 0  PHQ - 2 Score 1 0 0  Altered sleeping 2 1 1   Tired, decreased energy 2 1 0  Change in appetite 0 0 0  Feeling bad or failure about yourself  0 0 0  Trouble concentrating 1 2 0  Moving slowly or fidgety/restless 1 2 0  Suicidal thoughts 0 0 0  PHQ-9 Score 7 6 1   Difficult doing work/chores - - Not difficult at all   GAD 7 : Generalized Anxiety Score 03/29/2019 10/20/2018 09/20/2018  Nervous, Anxious, on Edge 2 1 2   Control/stop worrying 0 0 1  Worry too much - different things 1 0 1  Trouble relaxing 2 1 3   Restless 2 0 3  Easily annoyed or irritable 2 0 1  Afraid - awful might happen 0 0 0  Total GAD 7 Score 9 2 11   Anxiety Difficulty Somewhat difficult Somewhat difficult Somewhat difficult   Relevant past medical, surgical, family and social history reviewed and updated as indicated. Interim medical history since our last visit reviewed. Allergies and medications reviewed and updated.  Review of Systems  Per HPI unless specifically indicated above     Objective:    BP 115/74   Pulse 72   Temp 98.4 F (36.9 C) (Oral)   Ht 5'  8" (1.727 m)   Wt 148 lb (67.1 kg)   SpO2 99%   BMI 22.50 kg/m   Wt Readings from Last 3 Encounters:  03/29/19 148 lb (67.1 kg)  03/22/19 149 lb (67.6 kg)  12/26/18 158 lb (71.7 kg)    Physical Exam  Results for orders placed or performed in visit on 02/02/19  UA/M w/rflx Culture, Routine-H   Specimen: Urine   URINE  Result Value Ref Range   Specific Gravity, UA 1.020 1.005 - 1.030   pH, UA 7.5 5.0 - 7.5   Color, UA Yellow Yellow   Appearance Ur Cloudy (A) Clear   Leukocytes,UA Negative Negative   Protein,UA Negative Negative/Trace   Glucose, UA Negative Negative   Ketones, UA Negative Negative   RBC, UA Negative Negative   Bilirubin, UA Negative Negative   Urobilinogen, Ur 0.2 0.2 - 1.0 mg/dL   Nitrite, UA Negative Negative  CBC With Differential/Platelet here  Result Value Ref Range   WBC 6.3 3.4 - 10.8 x10E3/uL   RBC 4.80 4.14 - 5.80 x10E6/uL   Hemoglobin 15.6 13.0 - 17.7 g/dL   Hematocrit  - 51.0 %  MCV 89 79 - 97 fL   MCH 32.5 26.6 - 33.0 pg   MCHC 36.4 (H) 31.5 - 35.7 g/dL   RDW 13.0 11.6 - 15.4 %   Platelets 200 150 - 450 x10E3/uL   Neutrophils 53 Not Estab. %   Lymphs 40 Not Estab. %   MID 7 Not Estab. %   Neutrophils Absolute 3.4 1.4 - 7.0 x10E3/uL   Lymphocytes Absolute 2.5 0.7 - 3.1 x10E3/uL   MID (Absolute) 0.4 0.1 - 1.6 [id number]  Basic metabolic panel  Result Value Ref Range   Glucose 89 65 - 99 mg/dL   BUN 9 6 - 20 mg/dL   Creatinine, Ser 0.70 (L) 0.76 - 1.27 mg/dL   GFR calc non Af Amer 123 >59 mL/min/1.73   GFR calc Af Amer 142 >59 mL/min/1.73   BUN/Creatinine Ratio 13 9 - 20   Sodium 142 134 - 144 mmol/L   Potassium 3.9 3.5 - 5.2 mmol/L   Chloride 102 96 - 106 mmol/L   CO2 27 20 - 29 mmol/L   Calcium 9.6 8.7 - 10.2 mg/dL      Assessment & Plan:   Problem List Items Addressed This Visit      Other   Bipolar disorder ([place]) - Primary    Stable and under good control, continue current regimen      Anxiety    D/c buspar,  start effexor XR for better consistent coverage for his anxiety. Continue rest of regimen as before.       Relevant Medications   venlafaxine XR (EFFEXOR XR) 75 MG 24 hr capsule       Follow up plan: Return in about 4 weeks (around 04/26/2019) for anxiety f/u.

## 2019-03-29 NOTE — Assessment & Plan Note (Signed)
Stable and under good control, continue current regimen 

## 2019-03-29 NOTE — Assessment & Plan Note (Signed)
D/c buspar, start effexor XR for better consistent coverage for his anxiety. Continue rest of regimen as before.

## 2019-04-03 ENCOUNTER — Ambulatory Visit: Payer: 59 | Attending: Internal Medicine

## 2019-04-03 DIAGNOSIS — Z20822 Contact with and (suspected) exposure to covid-19: Secondary | ICD-10-CM

## 2019-04-05 LAB — NOVEL CORONAVIRUS, NAA: SARS-CoV-2, NAA: NOT DETECTED

## 2019-04-27 ENCOUNTER — Ambulatory Visit: Payer: 59 | Admitting: Family Medicine

## 2019-05-04 ENCOUNTER — Encounter: Payer: Self-pay | Admitting: Family Medicine

## 2019-05-04 ENCOUNTER — Ambulatory Visit (INDEPENDENT_AMBULATORY_CARE_PROVIDER_SITE_OTHER): Payer: 59 | Admitting: Family Medicine

## 2019-05-04 VITALS — Wt 148.0 lb

## 2019-05-04 DIAGNOSIS — F3131 Bipolar disorder, current episode depressed, mild: Secondary | ICD-10-CM | POA: Diagnosis not present

## 2019-05-04 DIAGNOSIS — F419 Anxiety disorder, unspecified: Secondary | ICD-10-CM | POA: Diagnosis not present

## 2019-05-04 MED ORDER — VENLAFAXINE HCL ER 150 MG PO CP24: 150.0000 mg | ORAL_CAPSULE | Freq: Every day | ORAL | 1 refills | Status: DC

## 2019-05-04 NOTE — Progress Notes (Signed)
Wt 148 lb (67.1 kg)   BMI 22.50 kg/m    Subjective:    Patient ID: Joel Carr, male    DOB: [birth year], 34 y.o.   MRN: [id number]  HPI: Joel Carr is a 34 y.o. male  Chief Complaint  Patient presents with  . Anxiety    . This visit was completed via WebEx due to the restrictions of the COVID-19 pandemic. All issues as above were discussed and addressed. Physical exam was done as above through visual confirmation on WebEx. If it was felt that the patient should be evaluated in the office, they were directed there. The patient verbally consented to this visit. . Location of the patient: home . Location of the provider: work . Those involved with this call:  . Provider: Merrie Roof, PA-C . CMA: Lesle Chris, [place] . Front Desk/Registration: Jill Side  . Time spent on call: 15 minutes with patient face to face via video conference. More than 50% of this time was spent in counseling and coordination of care. 5 minutes total spent in review of patient's record and preparation of their chart. I verified patient identity using two factors (patient name and date of birth). Patient consents verbally to being seen via telemedicine visit today.   Patient presenting today for 1 month anxiety f/u after switching from buspar to effexor XR to help with more consistent anxiety coverage. Does note some benefit with this change but still having consistent anxious feelings. Denies side effects, SI/HI, frequent panic attacks.  Depression screen [facility] 2/9 05/04/2019 03/29/2019 09/20/2018  Decreased Interest 0 1 0  Down, Depressed, Hopeless 0 0 0  PHQ - 2 Score 0 1 0  Altered sleeping 3 2 1   Tired, decreased energy 1 2 1   Change in appetite 0 0 0  Feeling bad or failure about yourself  0 0 0  Trouble concentrating 0 1 2  Moving slowly or fidgety/restless 0 1 2  Suicidal thoughts 0 0 0  PHQ-9 Score 4 7 6   Difficult doing work/chores Somewhat difficult - -   GAD 7 : Generalized Anxiety Score  05/04/2019 03/29/2019 10/20/2018 09/20/2018  Nervous, Anxious, on Edge 2 2 1 2   Control/stop worrying 0 0 0 1  Worry too much - different things 0 1 0 1  Trouble relaxing 3 2 1 3   Restless 2 2 0 3  Easily annoyed or irritable 1 2 0 1  Afraid - awful might happen 0 0 0 0  Total GAD 7 Score 8 9 2 11   Anxiety Difficulty Somewhat difficult Somewhat difficult Somewhat difficult Somewhat difficult     Relevant past medical, surgical, family and social history reviewed and updated as indicated. Interim medical history since our last visit reviewed. Allergies and medications reviewed and updated.  Review of Systems  Per HPI unless specifically indicated above     Objective:    Wt 148 lb (67.1 kg)   BMI 22.50 kg/m   Wt Readings from Last 3 Encounters:  05/08/19 148 lb (67.1 kg)  05/04/19 148 lb (67.1 kg)  03/29/19 148 lb (67.1 kg)    Physical Exam Vitals and nursing note reviewed.  Constitutional:      General: He is not in acute distress.    Appearance: Normal appearance.  HENT:     Head: Atraumatic.     Right Ear: External ear normal.     Left Ear: External ear normal.     Nose: Nose normal. No congestion.  Mouth/Throat:     Mouth: Mucous membranes are moist.     Pharynx: Oropharynx is clear.  Eyes:     Extraocular Movements: Extraocular movements intact.     Conjunctiva/sclera: Conjunctivae normal.  Pulmonary:     Effort: Pulmonary effort is normal. No respiratory distress.  Musculoskeletal:        General: Normal range of motion.     Cervical back: Normal range of motion.  Skin:    General: Skin is dry.     Findings: No erythema or rash.  Neurological:     Mental Status: He is oriented to person, place, and time.  Psychiatric:        Mood and Affect: Mood normal.        Thought Content: Thought content normal.        Judgment: Judgment normal.     Results for orders placed or performed in visit on 04/03/19  Novel Coronavirus, NAA ([facility])   Specimen:  Nasopharyngeal(NP) swabs in vial transport medium   NASOPHARYNGE  TESTING  Result Value Ref Range   SARS-CoV-2, NAA Not Detected Not Detected      Assessment & Plan:   Problem List Items Addressed This Visit      Other   Bipolar disorder (HCC)    Moods under good control, continue current regimen aside from increase in effexor to help with anxiety coverage      Anxiety - Primary    Has benefited from starting effexor. Increase to 150 mg XR dose and continue to monitor closely. Continue remainder of regimen as before      Relevant Medications   venlafaxine XR (EFFEXOR XR) 150 MG 24 hr capsule       Follow up plan: Return in about 3 months (around 08/01/2019) for CPE.

## 2019-05-07 ENCOUNTER — Encounter: Payer: Self-pay | Admitting: Family Medicine

## 2019-05-08 ENCOUNTER — Ambulatory Visit (INDEPENDENT_AMBULATORY_CARE_PROVIDER_SITE_OTHER): Payer: 59 | Admitting: Nurse Practitioner

## 2019-05-08 ENCOUNTER — Encounter: Payer: Self-pay | Admitting: Nurse Practitioner

## 2019-05-08 ENCOUNTER — Other Ambulatory Visit: Payer: Self-pay

## 2019-05-08 VITALS — BP 120/66 | HR 83 | Temp 98.0°F | Ht 68.0 in | Wt 148.0 lb

## 2019-05-08 DIAGNOSIS — R109 Unspecified abdominal pain: Secondary | ICD-10-CM | POA: Diagnosis not present

## 2019-05-08 DIAGNOSIS — G44209 Tension-type headache, unspecified, not intractable: Secondary | ICD-10-CM

## 2019-05-08 LAB — UA/M W/RFLX CULTURE, ROUTINE
Bilirubin, UA: NEGATIVE
Glucose, UA: NEGATIVE
Ketones, UA: NEGATIVE
Leukocytes,UA: NEGATIVE
Nitrite, UA: NEGATIVE
Protein,UA: NEGATIVE
RBC, UA: NEGATIVE
Specific Gravity, UA: 1.01 (ref 1.005–1.030)
Urobilinogen, Ur: 0.2 mg/dL (ref 0.2–1.0)
pH, UA: 7 (ref 5.0–7.5)

## 2019-05-08 MED ORDER — EXCEDRIN MIGRAINE 250-250-65 MG PO TABS: 1.0000 | ORAL_TABLET | Freq: Four times a day (QID) | ORAL | 0 refills | Status: DC | PRN

## 2019-05-08 MED ORDER — KETOROLAC TROMETHAMINE 60 MG/2ML IM SOLN
60.0000 mg | Freq: Once | INTRAMUSCULAR | Status: AC
  Administered 2019-05-08: 14:00:00 60 mg via INTRAMUSCULAR

## 2019-05-08 MED ORDER — TAMSULOSIN HCL 0.4 MG PO CAPS: 0.4000 mg | ORAL_CAPSULE | Freq: Every day | ORAL | 0 refills | Status: DC

## 2019-05-08 MED ORDER — CYCLOBENZAPRINE HCL 10 MG PO TABS: 10.0000 mg | ORAL_TABLET | Freq: Every evening | ORAL | 0 refills | Status: DC | PRN

## 2019-05-08 NOTE — Assessment & Plan Note (Signed)
Acute, ongoing x1 day.  No red flags on examination.  IM NSAID given in office today.  Will try on asa-APAP-caffeine starting tomorrow if not relieved by toradol.  Patient advised to stay hydrated with plenty of water.  If not relieved by end of week, may consider triptan.

## 2019-05-08 NOTE — Assessment & Plan Note (Signed)
Acute, ongoing.  Given sudden onset, thinking likely nephrolithiasis.  UA unremarkable in clinic today.  IM NSAID given in office today.  Will start on Flomax daily for 10 days and muscle relaxer as needed for pain.  If not better by end of week, may consider abdominal x-ray.  If fever, changes in urine, nausea/vomiting and unable to keep liquids down, or sudden onset severe pain, go to ED.

## 2019-05-08 NOTE — Progress Notes (Signed)
BP 120/66 (BP Location: Left Arm, Patient Position: Sitting, Cuff Size: Normal)   Pulse 83   Temp 98 F (36.7 C) (Oral)   Ht 5\' 8"  (1.727 m)   Wt 148 lb (67.1 kg)   SpO2 98%   BMI 22.50 kg/m    Subjective:    Patient ID: , male    DOB: [birth year], 34 y.o.   MRN: [id number]  HPI: Joel Carr is a 34 y.o. male  Chief Complaint  Patient presents with  . Flank Pain    Left side. Ongoing 3 days.    BACK PAIN Duration: days Mechanism of injury: unknown Location: Left Onset: sudden Severity:  6/10 Quality: dull Frequency: constant Radiation: up back and to left hip Aggravating factors: lifting, movement, walking, prolonged sitting and coughing Alleviating factors: heat, Tylenol, ibuprofen Status: stable Treatments attempted: rest, heat, APAP, ibuprofen  Relief with NSAIDs?: mild Nighttime pain:  no Paresthesias / decreased sensation:  no Bowel / bladder incontinence:  no Fevers:  no Dysuria / urinary frequency:  yes, no dysuria Has a history of kidney stones, does not feel the same but thinks it could be related. He reports drinking at least 8 cups of water per day.  HEADACHE Duration: days Onset: sudden Severity: 8/10 Quality: sharp and shooting Frequency: constant Location: temples and anterior Headache duration: all day Radiation: yes; down neck Alleviating factors: none Aggravating factors: light, Nausea: yes Vomiting: no Diarrhea: no Headache status at time of visit: current headache Treatments attempted: Treatments attempted: none   Aura: no Nausea:  no Vomiting: no Photophobia:  yes Phonophobia:  no Effect on social functioning:  no Confusion:  no Gait disturbance/ataxia:  no Behavioral changes:  no Fevers:  no  Reports he feels, "off"  Allergies  Allergen Reactions  . Tramadol     Jittery   Outpatient Encounter Medications as of 05/08/2019  Medication Sig  . albuterol (VENTOLIN HFA) 108 (90 Base) MCG/ACT inhaler Inhale 2  puffs into the lungs every 6 (six) hours as needed for wheezing or shortness of breath.  . cyclobenzaprine (FLEXERIL) 10 MG tablet Take 1 tablet (10 mg total) by mouth at bedtime as needed for muscle spasms.  07/06/2019 gabapentin (NEURONTIN) 300 MG capsule Take 2 capsules (600 mg total) by mouth 2 (two) times daily as needed.  . lamoTRIgine (LAMICTAL) 100 MG tablet Take 1 tablet (100 mg total) by mouth 2 (two) times daily.  Marland Kitchen lurasidone (LATUDA) 40 MG TABS tablet Take 1 tablet (40 mg total) by mouth daily with breakfast.  . pantoprazole (PROTONIX) 20 MG tablet Take 1 tablet (20 mg total) by mouth daily.  Marland Kitchen venlafaxine XR (EFFEXOR XR) 150 MG 24 hr capsule Take 1 capsule (150 mg total) by mouth daily with breakfast.  . [DISCONTINUED] cyclobenzaprine (FLEXERIL) 10 MG tablet Take 1 tablet (10 mg total) by mouth at bedtime as needed for muscle spasms.  Marland Kitchen aspirin-acetaminophen-caffeine (EXCEDRIN MIGRAINE) 250-250-65 MG tablet Take 1 tablet by mouth every 6 (six) hours as needed for headache.  . tamsulosin (FLOMAX) 0.4 MG CAPS capsule Take 1 capsule (0.4 mg total) by mouth daily.  . [EXPIRED] ketorolac (TORADOL) injection 60 mg    No facility-administered encounter medications on file as of 05/08/2019.   Patient Active Problem List   Diagnosis Date Noted  . Flank pain 05/08/2019  . Tension headache 05/08/2019  . Anxiety 10/23/2018  . GERD (gastroesophageal reflux disease) 07/07/2018  . Insomnia 07/07/2018  . Nodule of skin of abdomen 02/08/2018  .  Impingement syndrome of shoulder region 10/10/2017  . RLS (restless legs syndrome) 02/02/2017  . Hyperlipidemia 04/10/2015  . Bipolar disorder ([place]) 01/01/2015   Past Medical History:  Diagnosis Date  . Bipolar disorder ([place])   . Depression     Review of Systems  Constitutional: Positive for activity change. Negative for appetite change, chills, fatigue and fever.  Gastrointestinal: Negative.  Negative for constipation, diarrhea, nausea and vomiting.    Genitourinary: Positive for flank pain and frequency. Negative for decreased urine volume, difficulty urinating, dysuria, enuresis and urgency.  Musculoskeletal: Positive for back pain. Negative for gait problem, joint swelling, neck pain and neck stiffness.  Skin: Negative.  Negative for color change and rash.  Neurological: Positive for headaches. Negative for dizziness, weakness and light-headedness.  Psychiatric/Behavioral: Negative.  Negative for agitation, confusion and decreased concentration. The patient is not nervous/anxious.     Per HPI unless specifically indicated above     Objective:    BP 120/66 (BP Location: Left Arm, Patient Position: Sitting, Cuff Size: Normal)   Pulse 83   Temp 98 F (36.7 C) (Oral)   Ht 5\' 8"  (1.727 m)   Wt 148 lb (67.1 kg)   SpO2 98%   BMI 22.50 kg/m   Wt Readings from Last 3 Encounters:  05/08/19 148 lb (67.1 kg)  05/04/19 148 lb (67.1 kg)  03/29/19 148 lb (67.1 kg)    Physical Exam Vitals and nursing note reviewed.  Constitutional:      General: He is not in acute distress.    Appearance: Normal appearance. He is normal weight. He is not ill-appearing or toxic-appearing.  HENT:     Head: Normocephalic and atraumatic.  Eyes:     General: No scleral icterus.       Right eye: No discharge.        Left eye: No discharge.     Extraocular Movements: Extraocular movements intact.     Pupils: Pupils are equal, round, and reactive to light.  Cardiovascular:     Pulses: Normal pulses.     Heart sounds: No murmur.  Pulmonary:     Effort: Pulmonary effort is normal. No respiratory distress.     Breath sounds: Normal breath sounds. No wheezing or rhonchi.  Abdominal:     General: Abdomen is flat. Bowel sounds are normal. There is no distension.     Tenderness: There is no abdominal tenderness. There is left CVA tenderness. There is no right CVA tenderness.  Musculoskeletal:        General: No swelling or tenderness. Normal range of motion.      Cervical back: Normal range of motion and neck supple. No rigidity or tenderness.     Right lower leg: No edema.     Left lower leg: No edema.  Skin:    General: Skin is warm and dry.     Coloration: Skin is not jaundiced or pale.  Neurological:     General: No focal deficit present.     Mental Status: He is alert and oriented to person, place, and time.     Sensory: No sensory deficit.     Motor: No weakness.     Coordination: Coordination normal.     Gait: Gait normal.  Psychiatric:        Mood and Affect: Mood normal.        Behavior: Behavior normal.        Thought Content: Thought content normal.        Judgment:  Judgment normal.        Assessment & Plan:   Problem List Items Addressed This Visit      Other   Flank pain - Primary    Acute, ongoing.  Given sudden onset, thinking likely nephrolithiasis.  UA unremarkable in clinic today.  IM NSAID given in office today.  Will start on Flomax daily for 10 days and muscle relaxer as needed for pain.  If not better by end of week, may consider abdominal x-ray.  If fever, changes in urine, nausea/vomiting and unable to keep liquids down, or sudden onset severe pain, go to ED.        Relevant Medications   tamsulosin (FLOMAX) 0.4 MG CAPS capsule   cyclobenzaprine (FLEXERIL) 10 MG tablet   Other Relevant Orders   UA/M w/rflx Culture, Routine   Tension headache    Acute, ongoing x1 day.  No red flags on examination.  IM NSAID given in office today.  Will try on asa-APAP-caffeine starting tomorrow if not relieved by toradol.  Patient advised to stay hydrated with plenty of water.  If not relieved by end of week, may consider triptan.      Relevant Medications   cyclobenzaprine (FLEXERIL) 10 MG tablet   aspirin-acetaminophen-caffeine (EXCEDRIN MIGRAINE) 250-250-65 MG tablet       Follow up plan: Return if symptoms worsen or fail to improve.

## 2019-05-08 NOTE — Patient Instructions (Signed)
Flank Pain, Adult Flank pain is pain that is located on the side of the body between the upper abdomen and the back. This area is called the flank. The pain may occur over a short period of time (acute), or it may be long-term or recurring (chronic). It may be mild or severe. Flank pain can be caused by many things, including:  Muscle soreness or injury.  Kidney stones or kidney disease.  Stress.  A disease of the spine (vertebral disk disease).  A lung infection (pneumonia).  Fluid around the lungs (pulmonary edema).  A skin rash caused by the chickenpox virus (shingles).  Tumors that affect the back of the abdomen.  Gallbladder disease. Follow these instructions at home:   Drink enough fluid to keep your urine clear or pale yellow.  Rest as told by your health care provider.  Take over-the-counter and prescription medicines only as told by your health care provider.  Keep a journal to track what has caused your flank pain and what has made it feel better.  Keep all follow-up visits as told by your health care provider. This is important. Contact a health care provider if:  Your pain is not controlled with medicine.  You have new symptoms.  Your pain gets worse.  You have a fever.  Your symptoms last longer than 2-3 days.  You have trouble urinating or you are urinating very frequently. Get help right away if:  You have trouble breathing or you are short of breath.  Your abdomen hurts or it is swollen or red.  You have nausea or vomiting.  You feel faint or you pass out.  You have blood in your urine. Summary  Flank pain is pain that is located on the side of the body between the upper abdomen and the back.  The pain may occur over a short period of time (acute), or it may be long-term or recurring (chronic). It may be mild or severe.  Flank pain can be caused by many things.  Contact your health care provider if your symptoms get worse or they last  longer than 2-3 days. This information is not intended to replace advice given to you by your health care provider. Make sure you discuss any questions you have with your health care provider. Document Revised: 02/25/2017 Document Reviewed: 05/28/2016 Elsevier Patient Education  2020 Elsevier Inc.  Kidney Stones  Kidney stones are solid, rock-like deposits that form inside of the kidneys. The kidneys are a pair of organs that make urine. A kidney Behe may form in a kidney and move into other parts of the urinary tract, including the tubes that connect the kidneys to the bladder (ureters), the bladder, and the tube that carries urine out of the body (urethra). As the Wierzba moves through these areas, it can cause intense pain and block the flow of urine. Kidney stones are created when high levels of certain minerals are found in the urine. The stones are usually passed out of the body through urination, but in some cases, medical treatment may be needed to remove them. What are the causes? Kidney stones may be caused by:  A condition in which certain glands produce too much parathyroid hormone (primary hyperparathyroidism), which causes too much calcium buildup in the blood.  A buildup of uric acid crystals in the bladder (hyperuricosuria). Uric acid is a chemical that the body produces when you eat certain foods. It usually exits the body in the urine.  Narrowing (stricture) of   one or both of the ureters.  A kidney blockage that is present at birth (congenital obstruction).  Past surgery on the kidney or the ureters, such as gastric bypass surgery. What increases the risk? The following factors may make you more likely to develop this condition:  Having had a kidney Kirshenbaum in the past.  Having a family history of kidney stones.  Not drinking enough water.  Eating a diet that is high in protein, salt (sodium), or sugar.  Being overweight or obese. What are the signs or  symptoms? Symptoms of a kidney Base may include:  Pain in the side of the abdomen, right below the ribs (flank pain). Pain usually spreads (radiates) to the groin.  Needing to urinate frequently or urgently.  Painful urination.  Blood in the urine (hematuria).  Nausea.  Vomiting.  Fever and chills. How is this diagnosed? This condition may be diagnosed based on:  Your symptoms and medical history.  A physical exam.  Blood tests.  Urine tests. These may be done before and after the Kundrat passes out of your body through urination.  Imaging tests, such as a CT scan, abdominal X-ray, or ultrasound.  A procedure to examine the inside of the bladder (cystoscopy). How is this treated? Treatment for kidney stones depends on the size, location, and makeup of the stones. Kidney stones will often pass out of the body through urination. You may need to:  Increase your fluid intake to help pass the Urbani. In some cases, you may be given fluids through an IV and may need to be monitored at the hospital.  Take medicine for pain.  Make changes in your diet to help prevent kidney stones from coming back. Sometimes, medical procedures are needed to remove a kidney Bambach. This may involve:  A procedure to break up kidney stones using: ? A focused beam of light (laser therapy). ? Shock waves (extracorporeal shock wave lithotripsy).  Surgery to remove kidney stones. This may be needed if you have severe pain or have stones that block your urinary tract. Follow these instructions at home: Medicines  Take over-the-counter and prescription medicines only as told by your health care provider.  Ask your health care provider if the medicine prescribed to you requires you to avoid driving or using heavy machinery. Eating and drinking  Drink enough fluid to keep your urine pale yellow. You may be instructed to drink at least 8-10 glasses of water each day. This will help you pass the kidney  Wooton.  If directed, change your diet. This may include: ? Limiting how much sodium you eat. ? Eating more fruits and vegetables. ? Limiting how much animal protein--such as red meat, poultry, fish, and eggs--you eat.  Follow instructions from your health care provider about eating or drinking restrictions. General instructions  Collect urine samples as told by your health care provider. You may need to collect a urine sample: ? 24 hours after you pass the Debenedetto. ? 8-12 weeks after passing the kidney Plucinski, and every 6-12 months after that.  Strain your urine every time you urinate, for as long as directed. Use the strainer that your health care provider recommends.  Do not throw out the kidney Crossno after passing it. Keep the Bakos so it can be tested by your health care provider. Testing the makeup of your kidney Depaula may help prevent you from getting kidney stones in the future.  Keep all follow-up visits as told by your health care provider. This   is important. You may need follow-up X-rays or ultrasounds to make sure that your Eriksson has passed. How is this prevented? To prevent another kidney Keelin:  Drink enough fluid to keep your urine pale yellow. This is the best way to prevent kidney stones.  Eat a healthy diet and follow recommendations from your health care provider about foods to avoid. You may be instructed to eat a low-protein diet. Recommendations vary depending on the type of kidney Holwerda that you have.  Maintain a healthy weight. Where to find more information  National Kidney Foundation (NKF): www.kidney.org  [employer] (UCF): www.urologyhealth.org Contact a health care provider if:  You have pain that gets worse or does not get better with medicine. Get help right away if:  You have a fever or chills.  You develop severe pain.  You develop new abdominal pain.  You faint.  You are unable to urinate. Summary  Kidney stones are solid,  rock-like deposits that form inside of the kidneys.  Kidney stones can cause nausea, vomiting, blood in the urine, abdominal pain, and the urge to urinate frequently.  Treatment for kidney stones depends on the size, location, and makeup of the stones. Kidney stones will often pass out of the body through urination.  Kidney stones can be prevented by drinking enough fluids, eating a healthy diet, and maintaining a healthy weight. This information is not intended to replace advice given to you by your health care provider. Make sure you discuss any questions you have with your health care provider. Document Revised: 08/01/2018 Document Reviewed: 08/01/2018 Elsevier Patient Education  2020 Elsevier Inc.  

## 2019-05-09 ENCOUNTER — Encounter: Payer: Self-pay | Admitting: Family Medicine

## 2019-05-09 NOTE — Progress Notes (Signed)
Lvm for 3 month physical

## 2019-05-09 NOTE — Assessment & Plan Note (Signed)
Has benefited from starting effexor. Increase to 150 mg XR dose and continue to monitor closely. Continue remainder of regimen as before

## 2019-05-09 NOTE — Assessment & Plan Note (Signed)
Moods under good control, continue current regimen aside from increase in effexor to help with anxiety coverage

## 2019-05-11 ENCOUNTER — Ambulatory Visit: Payer: 59 | Attending: Internal Medicine

## 2019-05-11 DIAGNOSIS — Z20822 Contact with and (suspected) exposure to covid-19: Secondary | ICD-10-CM

## 2019-05-12 LAB — NOVEL CORONAVIRUS, NAA: SARS-CoV-2, NAA: NOT DETECTED

## 2019-05-31 ENCOUNTER — Encounter: Payer: Self-pay | Admitting: Family Medicine

## 2019-06-01 ENCOUNTER — Encounter: Payer: Self-pay | Admitting: Family Medicine

## 2019-06-01 ENCOUNTER — Telehealth (INDEPENDENT_AMBULATORY_CARE_PROVIDER_SITE_OTHER): Payer: 59 | Admitting: Family Medicine

## 2019-06-01 VITALS — Ht 68.0 in | Wt 149.0 lb

## 2019-06-01 DIAGNOSIS — F3131 Bipolar disorder, current episode depressed, mild: Secondary | ICD-10-CM

## 2019-06-01 DIAGNOSIS — F419 Anxiety disorder, unspecified: Secondary | ICD-10-CM

## 2019-06-01 MED ORDER — CLONAZEPAM 0.5 MG PO TABS: 0.5000 mg | ORAL_TABLET | Freq: Every day | ORAL | 0 refills | Status: DC | PRN

## 2019-06-01 NOTE — Progress Notes (Signed)
Ht 5\' 8"  (1.727 m)   Wt 149 lb (67.6 kg)   BMI 22.66 kg/m    Subjective:    Patient ID: Joel Carr, male    DOB: [birth year], 34 y.o.   MRN: [id number]  HPI: Joel Carr is a 34 y.o. male  Chief Complaint  Patient presents with  . Chills  . Panic Attack    Panic Attack on 05/29/2019 and was out of work.     . This visit was completed via MyChart due to the restrictions of the COVID-19 pandemic. All issues as above were discussed and addressed. Physical exam was done as above through visual confirmation on MyChart. If it was felt that the patient should be evaluated in the office, they were directed there. The patient verbally consented to this visit. . Location of the patient: home . Location of the provider: work . Those involved with this call:  . Provider: Merrie Roof, PA-C . CMA: Lesle Chris, [place] . Front Desk/Registration: Jill Side  . Time spent on call: 15 minutes with patient face to face via video conference. More than 50% of this time was spent in counseling and coordination of care. 5 minutes total spent in review of patient's record and preparation of their chart. I verified patient identity using two factors (patient name and date of birth). Patient consents verbally to being seen via telemedicine visit today.   Here today to discuss anxiety issues he's been having lately. Has lots of stressors currently with caregiver role and family health issues. Overall feels the medications are helping as far as his mood lability with bipolar disorder and previously did fairly well for anxiety. Currently taking effexor, latuda, and lamictal regimen. Having breakthrough panic episodes about twice a week. Has a therapist but has not been able to get in with them for over a year now. Plans to go back for sessions. Denies SI/HI.   Depression screen [facility] 2/9 06/01/2019 05/04/2019 03/29/2019  Decreased Interest 0 0 1  Down, Depressed, Hopeless 0 0 0  PHQ - 2 Score 0 0 1  Altered  sleeping 2 3 2   Tired, decreased energy 2 1 2   Change in appetite 1 0 0  Feeling bad or failure about yourself  0 0 0  Trouble concentrating 0 0 1  Moving slowly or fidgety/restless 0 0 1  Suicidal thoughts 0 0 0  PHQ-9 Score 5 4 7   Difficult doing work/chores Somewhat difficult Somewhat difficult -   GAD 7 : Generalized Anxiety Score 06/01/2019 05/04/2019 03/29/2019 10/20/2018  Nervous, Anxious, on Edge 2 2 2 1   Control/stop worrying 1 0 0 0  Worry too much - different things 1 0 1 0  Trouble relaxing 1 3 2 1   Restless 2 2 2  0  Easily annoyed or irritable 1 1 2  0  Afraid - awful might happen 0 0 0 0  Total GAD 7 Score 8 8 9 2   Anxiety Difficulty Somewhat difficult Somewhat difficult Somewhat difficult Somewhat difficult     Relevant past medical, surgical, family and social history reviewed and updated as indicated. Interim medical history since our last visit reviewed. Allergies and medications reviewed and updated.  Review of Systems  Per HPI unless specifically indicated above     Objective:    Ht 5\' 8"  (1.727 m)   Wt 149 lb (67.6 kg)   BMI 22.66 kg/m   Wt Readings from Last 3 Encounters:  06/01/19 149 lb (67.6 kg)  05/08/19 148  lb (67.1 kg)  05/04/19 148 lb (67.1 kg)    Physical Exam Vitals and nursing note reviewed.  Constitutional:      General: He is not in acute distress.    Appearance: Normal appearance.  HENT:     Head: Atraumatic.     Right Ear: External ear normal.     Left Ear: External ear normal.     Nose: Nose normal. No congestion.     Mouth/Throat:     Mouth: Mucous membranes are moist.     Pharynx: Oropharynx is clear.  Eyes:     Extraocular Movements: Extraocular movements intact.     Conjunctiva/sclera: Conjunctivae normal.  Pulmonary:     Effort: Pulmonary effort is normal. No respiratory distress.  Musculoskeletal:        General: Normal range of motion.     Cervical back: Normal range of motion.  Skin:    General: Skin is dry.      Findings: No erythema or rash.  Neurological:     Mental Status: He is oriented to person, place, and time.  Psychiatric:        Mood and Affect: Mood normal.        Thought Content: Thought content normal.        Judgment: Judgment normal.     Results for orders placed or performed in visit on 05/11/19  Novel Coronavirus, NAA ([facility])   Specimen: Nasopharyngeal(NP) swabs in vial transport medium   NASOPHARYNGE  TESTING  Result Value Ref Range   SARS-CoV-2, NAA Not Detected Not Detected      Assessment & Plan:   Problem List Items Addressed This Visit      Other   Bipolar disorder (HCC)    Stable and under good control of moods, continue current regimen      Anxiety - Primary    Exacerbated, with about 2 panic episodes per week currently. Will start low dose klonopin rarely prn. Discussed can even try cutting into halves to take prn. This supply should last him several months, pt aware. Long discussion about risks and precautions, including sedation, addiction, dependency.           Follow up plan: Return in about 4 weeks (around 06/29/2019) for anxiety f/u.

## 2019-06-11 NOTE — Assessment & Plan Note (Signed)
Exacerbated, with about 2 panic episodes per week currently. Will start low dose klonopin rarely prn. Discussed can even try cutting into halves to take prn. This supply should last him several months, pt aware. Long discussion about risks and precautions, including sedation, addiction, dependency.

## 2019-06-11 NOTE — Assessment & Plan Note (Signed)
Stable and under good control of moods, continue current regimen

## 2019-06-15 ENCOUNTER — Encounter: Payer: Self-pay | Admitting: Family Medicine

## 2019-06-26 ENCOUNTER — Encounter: Payer: Self-pay | Admitting: Family Medicine

## 2019-06-29 ENCOUNTER — Encounter: Payer: Self-pay | Admitting: Family Medicine

## 2019-07-02 ENCOUNTER — Ambulatory Visit (INDEPENDENT_AMBULATORY_CARE_PROVIDER_SITE_OTHER): Payer: 59 | Admitting: Nurse Practitioner

## 2019-07-02 ENCOUNTER — Encounter: Payer: Self-pay | Admitting: Nurse Practitioner

## 2019-07-02 ENCOUNTER — Other Ambulatory Visit: Payer: Self-pay | Admitting: Family Medicine

## 2019-07-02 ENCOUNTER — Other Ambulatory Visit: Payer: Self-pay

## 2019-07-02 ENCOUNTER — Other Ambulatory Visit: Payer: Self-pay | Admitting: Nurse Practitioner

## 2019-07-02 VITALS — BP 127/75 | HR 81 | Temp 98.0°F | Ht 68.0 in | Wt 162.2 lb

## 2019-07-02 DIAGNOSIS — R109 Unspecified abdominal pain: Secondary | ICD-10-CM

## 2019-07-02 DIAGNOSIS — G44209 Tension-type headache, unspecified, not intractable: Secondary | ICD-10-CM

## 2019-07-02 MED ORDER — SUMATRIPTAN SUCCINATE 50 MG PO TABS: ORAL_TABLET | ORAL | 0 refills | Status: DC

## 2019-07-02 MED ORDER — CYCLOBENZAPRINE HCL 10 MG PO TABS: 10.0000 mg | ORAL_TABLET | Freq: Every evening | ORAL | 0 refills | Status: DC | PRN

## 2019-07-02 MED ORDER — KETOROLAC TROMETHAMINE 60 MG/2ML IM SOLN
60.0000 mg | Freq: Once | INTRAMUSCULAR | Status: AC
  Administered 2019-07-02: 60 mg via INTRAMUSCULAR

## 2019-07-02 NOTE — Progress Notes (Signed)
BP 127/75 (BP Location: Left Arm, Patient Position: Sitting, Cuff Size: Normal)   Pulse 81   Temp 98 F (36.7 C) (Oral)   Ht 5\' 8"  (1.727 m)   Wt 162 lb 3.2 oz (73.6 kg)   SpO2 98%   BMI 24.66 kg/m    Subjective:    Patient ID: Joel Carr, male    DOB: 1985/04/20, 34 y.o.   MRN: [id number]  HPI: Joel Carr is a 34 y.o. male presenting for migraine.  Chief Complaint  Patient presents with  . Migraine    Ongoing appx 2 weeks  . Nausea   MIGRAINES Was seen in clinic on 05/08/2019 for same and was given Excedrin.  He states the Excedrin worked at first but it has stopped working.   Duration: weeks Onset: sudden Severity: 3-8/10 depending on times of day Quality: sharp Frequency: constant Location: both sides Headache duration: ongoing still Radiation: no Time of day headache occurs: all day Alleviating factors: ice Aggravating factors: waking up Headache status at time of visit: current headache Treatments attempted: Treatments attempted: ice and aleve", excedrine   Aura: no  Nausea:  yes Vomiting: no Photophobia:  yes Phonophobia:  yes Effect on social functioning:  yes; kept out of work last week Numbers of missed days of school/work each month: 5 Confusion:  no  Dizziness: yes Gait disturbance/ataxia:  no Behavioral changes:  yes; more anxious Fevers:  no  Allergies  Allergen Reactions  . Tramadol     Jittery   Outpatient Encounter Medications as of 07/02/2019  Medication Sig  . albuterol (VENTOLIN HFA) 108 (90 Base) MCG/ACT inhaler Inhale 2 puffs into the lungs every 6 (six) hours as needed for wheezing or shortness of breath.  Marland Kitchen aspirin-acetaminophen-caffeine (EXCEDRIN MIGRAINE) 250-250-65 MG tablet Take 1 tablet by mouth every 6 (six) hours as needed for headache.  . clonazePAM (KLONOPIN) 0.5 MG tablet Take 1 tablet (0.5 mg total) by mouth daily as needed for anxiety.  . cyclobenzaprine (FLEXERIL) 10 MG tablet Take 1 tablet (10 mg total) by mouth at  bedtime as needed for muscle spasms.  Marland Kitchen gabapentin (NEURONTIN) 300 MG capsule Take 2 capsules (600 mg total) by mouth 2 (two) times daily as needed.  . lamoTRIgine (LAMICTAL) 100 MG tablet Take 1 tablet (100 mg total) by mouth 2 (two) times daily.  Marland Kitchen lurasidone (LATUDA) 40 MG TABS tablet Take 1 tablet (40 mg total) by mouth daily with breakfast.  . pantoprazole (PROTONIX) 20 MG tablet Take 1 tablet (20 mg total) by mouth daily.  . tamsulosin (FLOMAX) 0.4 MG CAPS capsule Take 1 capsule (0.4 mg total) by mouth daily.  Marland Kitchen venlafaxine XR (EFFEXOR XR) 150 MG 24 hr capsule Take 1 capsule (150 mg total) by mouth daily with breakfast.  . SUMAtriptan (IMITREX) 50 MG tablet Take 1 tab at onset of migriane.  Can take another tab in 2 hours if headache persists.  Max 2 tabs per day.  . [EXPIRED] ketorolac (TORADOL) injection 60 mg    No facility-administered encounter medications on file as of 07/02/2019.   Patient Active Problem List   Diagnosis Date Noted  . Flank pain 05/08/2019  . Tension headache 05/08/2019  . Anxiety 10/23/2018  . GERD (gastroesophageal reflux disease) 07/07/2018  . Insomnia 07/07/2018  . Nodule of skin of abdomen 02/08/2018  . Impingement syndrome of shoulder region 10/10/2017  . RLS (restless legs syndrome) 02/02/2017  . Hyperlipidemia 04/10/2015  . Bipolar disorder ([place]) 01/01/2015   Past  Medical History:  Diagnosis Date  . Bipolar disorder (HCC)   . Depression    Relevant past medical, surgical, family and social history reviewed and updated as indicated. Interim medical history since our last visit reviewed.  Review of Systems  Constitutional: Positive for activity change, appetite change and fatigue. Negative for diaphoresis and fever.  Eyes: Positive for photophobia. Negative for visual disturbance.  Gastrointestinal: Positive for nausea. Negative for vomiting.  Skin: Negative.  Negative for color change and rash.  Neurological: Positive for dizziness and  headaches. Negative for facial asymmetry, weakness, light-headedness and numbness.  Psychiatric/Behavioral: Negative for agitation, confusion and decreased concentration. The patient is nervous/anxious.    Per HPI unless specifically indicated above     Objective:    BP 127/75 (BP Location: Left Arm, Patient Position: Sitting, Cuff Size: Normal)   Pulse 81   Temp 98 F (36.7 C) (Oral)   Ht 5\' 8"  (1.727 m)   Wt 162 lb 3.2 oz (73.6 kg)   SpO2 98%   BMI 24.66 kg/m   Wt Readings from Last 3 Encounters:  07/02/19 162 lb 3.2 oz (73.6 kg)  06/01/19 149 lb (67.6 kg)  05/08/19 148 lb (67.1 kg)    Physical Exam Vitals and nursing note reviewed.  Constitutional:      General: He is not in acute distress.    Appearance: Normal appearance. He is normal weight. He is not toxic-appearing.  Eyes:     General: No scleral icterus.       Right eye: No discharge.        Left eye: No discharge.     Extraocular Movements: Extraocular movements intact.     Pupils: Pupils are equal, round, and reactive to light.  Abdominal:     General: Abdomen is flat. Bowel sounds are normal. There is no distension.  Skin:    General: Skin is warm and dry.     Coloration: Skin is not jaundiced or pale.  Neurological:     General: No focal deficit present.     Mental Status: He is alert and oriented to person, place, and time.     Motor: No weakness.     Gait: Gait normal.  Psychiatric:        Mood and Affect: Mood normal.        Behavior: Behavior normal.        Thought Content: Thought content normal.        Judgment: Judgment normal.        Assessment & Plan:   Problem List Items Addressed This Visit      Other   Tension headache - Primary    Ongoing for about 2 months.  Previously well-controlled with Excedrin, will start abortive therapy with triptan.  No red flags on examination.  IM NSAID given today.  Advised to continue hydration with water and if not better by end of week, call or return  to clinic.      Relevant Medications   SUMAtriptan (IMITREX) 50 MG tablet       Follow up plan: Return if symptoms worsen or fail to improve.

## 2019-07-02 NOTE — Telephone Encounter (Signed)
LOV: 07/02/2019 with Darly Massi Asaro, NP 

## 2019-07-02 NOTE — Assessment & Plan Note (Signed)
Ongoing for about 2 months.  Previously well-controlled with Excedrin, will start abortive therapy with triptan.  No red flags on examination.  IM NSAID given today.  Advised to continue hydration with water and if not better by end of week, call or return to clinic.

## 2019-07-02 NOTE — Patient Instructions (Signed)
Tension Headache, Adult A tension headache is pain, pressure, or aching in your head. Tension headaches can last from 30 minutes to several days. Follow these instructions at home: Managing pain  Take over-the-counter and prescription medicines only as told by your doctor.  When you have a headache, lie down in a dark, quiet room.  If told, put ice on your head and neck: ? Put ice in a plastic bag. ? Place a towel between your skin and the bag. ? Leave the ice on for 20 minutes, 2-3 times a day.  If told, put heat on the back of your neck. Do this as often as your doctor tells you to. Use the kind of heat that your doctor recommends, such as a moist heat pack or a heating pad. ? Place a towel between your skin and the heat. ? Leave the heat on for 20-30 minutes. ? Remove the heat if your skin turns bright red. Eating and drinking  Eat meals on a regular schedule.  Watch how much alcohol you drink: ? If you are a woman and are not pregnant, do not drink more than 1 drink a day. ? If you are a man, do not drink more than 2 drinks a day.  Drink enough fluid to keep your pee (urine) pale yellow.  Do not use a lot of caffeine, or stop using caffeine. Lifestyle  Get enough sleep. Get 7-9 hours of sleep each night. Or get the amount of sleep that your doctor tells you to.  At bedtime, remove all electronic devices from your room. Examples of electronic devices are computers, phones, and tablets.  Find ways to lessen your stress. Some things that can lessen stress are: ? Exercise. ? Deep breathing. ? Yoga. ? Music. ? Positive thoughts.  Sit up straight. Do not tighten (tense) your muscles.  Do not use any products that have nicotine or tobacco in them, such as cigarettes and e-cigarettes. If you need help quitting, ask your doctor. General instructions   Keep all follow-up visits as told by your doctor. This is important.  Avoid things that can bring on headaches. Keep a  journal to find out if certain things bring on headaches. For example, write down: ? What you eat and drink. ? How much sleep you get. ? Any change to your diet or medicines. Contact a doctor if:  Your headache does not get better.  Your headache comes back.  You have a headache and sounds, light, or smells bother you.  You feel sick to your stomach (nauseous) or you throw up (vomit).  Your stomach hurts. Get help right away if:  You suddenly get a very bad headache along with any of these: ? A stiff neck. ? Feeling sick to your stomach. ? Throwing up. ? Feeling weak. ? Trouble seeing. ? Feeling short of breath. ? A rash. ? Feeling unusually sleepy. ? Trouble speaking. ? Pain in your eye or ear. ? Trouble walking or balancing. ? Feeling like you will pass out (faint). ? Passing out. Summary  A tension headache is pain, pressure, or aching in your head.  Tension headaches can last from 30 minutes to several days.  Lifestyle changes and medicines may help relieve pain. This information is not intended to replace advice given to you by your health care provider. Make sure you discuss any questions you have with your health care provider. Document Revised: 01/10/2019 Document Reviewed: 06/25/2016 Elsevier Patient Education  2020 [employer]  

## 2019-07-02 NOTE — Telephone Encounter (Signed)
LOV: 07/02/2019 with Mardene Celeste, NP

## 2019-07-13 ENCOUNTER — Ambulatory Visit: Payer: 59 | Admitting: Family Medicine

## 2019-07-17 ENCOUNTER — Encounter: Payer: Self-pay | Admitting: Nurse Practitioner

## 2019-07-17 ENCOUNTER — Ambulatory Visit (INDEPENDENT_AMBULATORY_CARE_PROVIDER_SITE_OTHER): Payer: 59 | Admitting: Nurse Practitioner

## 2019-07-17 ENCOUNTER — Encounter: Payer: Self-pay | Admitting: Family Medicine

## 2019-07-17 ENCOUNTER — Other Ambulatory Visit: Payer: Self-pay

## 2019-07-17 VITALS — BP 170/91 | HR 102 | Temp 98.3°F

## 2019-07-17 DIAGNOSIS — M79645 Pain in left finger(s): Secondary | ICD-10-CM | POA: Diagnosis not present

## 2019-07-17 NOTE — Progress Notes (Signed)
BP (!) 170/91   Pulse (!) 102   Temp 98.3 F (36.8 C) (Oral)   SpO2 98%    Subjective:    Patient ID: Joel Carr, male    DOB: [birth year], 34 y.o.   MRN: [id number]  HPI: Joel Carr is a 34 y.o. male presenting with finger pain.  Chief Complaint  Patient presents with  . Hand Pain    pt states his L ring finger has been hurting since Saturday morning. States he fell in the boat while fishing.     FINGER PAIN Duration: days Involved finger: left4th  Mechanism of injury: trauma Onset: sudden Severity: 6/10  Quality: sharp and shooting Frequency: constant Radiation: yes tip of finger to knuckle Aggravating factors: movement, trying to straighten out Alleviating factors:  tried ice, tylenol, ibuprofen and nothing helps Status: stable Treatments attempted: APAP and ibuprofen ice Relief with NSAIDs?: no Morning stiffness: no Redness: no  Bruising: yes; near joint in top of finger Swelling: no Paresthesias / decreased sensation: numb but not tingling or pins/needles Fevers: no  Allergies  Allergen Reactions  . Tramadol     Jittery   Outpatient Encounter Medications as of 07/17/2019  Medication Sig  . albuterol (VENTOLIN HFA) 108 (90 Base) MCG/ACT inhaler Inhale 2 puffs into the lungs every 6 (six) hours as needed for wheezing or shortness of breath.  Marland Kitchen aspirin-acetaminophen-caffeine (EXCEDRIN MIGRAINE) 250-250-65 MG tablet Take 1 tablet by mouth every 6 (six) hours as needed for headache.  . clonazePAM (KLONOPIN) 0.5 MG tablet Take 1 tablet (0.5 mg total) by mouth daily as needed for anxiety.  . cyclobenzaprine (FLEXERIL) 10 MG tablet Take 1 tablet (10 mg total) by mouth at bedtime as needed for muscle spasms.  Marland Kitchen gabapentin (NEURONTIN) 300 MG capsule Take 2 capsules (600 mg total) by mouth 2 (two) times daily as needed.  . lamoTRIgine (LAMICTAL) 100 MG tablet Take 1 tablet (100 mg total) by mouth 2 (two) times daily.  Marland Kitchen lurasidone (LATUDA) 40 MG TABS tablet Take 1  tablet (40 mg total) by mouth daily with breakfast.  . pantoprazole (PROTONIX) 20 MG tablet Take 1 tablet (20 mg total) by mouth daily.  . SUMAtriptan (IMITREX) 50 MG tablet Take 1 tab at onset of migriane.  Can take another tab in 2 hours if headache persists.  Max 2 tabs per day.  . venlafaxine XR (EFFEXOR XR) 150 MG 24 hr capsule Take 1 capsule (150 mg total) by mouth daily with breakfast.  . tamsulosin (FLOMAX) 0.4 MG CAPS capsule Take 1 capsule (0.4 mg total) by mouth daily. (Patient not taking: Reported on 07/17/2019)   No facility-administered encounter medications on file as of 07/17/2019.   Patient Active Problem List   Diagnosis Date Noted  . Finger pain, left 07/17/2019  . Flank pain 05/08/2019  . Tension headache 05/08/2019  . Anxiety 10/23/2018  . GERD (gastroesophageal reflux disease) 07/07/2018  . Insomnia 07/07/2018  . Nodule of skin of abdomen 02/08/2018  . Impingement syndrome of shoulder region 10/10/2017  . RLS (restless legs syndrome) 02/02/2017  . Hyperlipidemia 04/10/2015  . Bipolar disorder ([place]) 01/01/2015   Past Medical History:  Diagnosis Date  . Bipolar disorder ([place])   . Depression    Relevant past medical, surgical, family and social history reviewed and updated as indicated. Interim medical history since our last visit reviewed.  Review of Systems  Constitutional: Negative.  Negative for activity change, appetite change, chills and fever.  Musculoskeletal: Positive for arthralgias  and joint swelling. Negative for back pain, gait problem, myalgias, neck pain and neck stiffness.  Skin: Positive for color change. Negative for pallor, rash and wound.  Neurological: Negative.   Psychiatric/Behavioral: Negative.     Per HPI unless specifically indicated above     Objective:    BP (!) 170/91   Pulse (!) 102   Temp 98.3 F (36.8 C) (Oral)   SpO2 98%   Wt Readings from Last 3 Encounters:  07/02/19 162 lb 3.2 oz (73.6 kg)  06/01/19 149 lb (67.6 kg)    05/08/19 148 lb (67.1 kg)    Physical Exam Vitals and nursing note reviewed.  Constitutional:      General: He is not in acute distress.    Appearance: Normal appearance. He is not toxic-appearing.  Musculoskeletal:     Right hand: Normal.     Left hand: Swelling (left fourth finger), tenderness and bony tenderness present. Decreased range of motion. Normal pulse.  Skin:    General: Skin is warm and dry.     Coloration: Skin is not jaundiced or pale.     Findings: Erythema (to left fourth finger) present. No bruising, lesion or rash.  Neurological:     General: No focal deficit present.     Mental Status: He is alert.     Motor: No weakness.     Gait: Gait normal.  Psychiatric:        Mood and Affect: Mood normal.        Behavior: Behavior normal.        Thought Content: Thought content normal.        Judgment: Judgment normal.       Assessment & Plan:   Problem List Items Addressed This Visit      Other   Finger pain, left - Primary    Acute, ongoing.  Due to recent fall.  Likely strained and/or fractured.  Encouraged use of [facility] walk-in clinic hours today.  If unable to be seen today, will order imaging of left hand for definitive diagnosis.  Instructed to continue conservative treatment with rest, ice, Tylenol/ibuprofen, and elevation.  Try to immobilize finger as much as possible.  Work note given.  Advised if not improving by end of week, call or return to clinic.      Relevant Orders   DG Hand Complete Left       Follow up plan: Return if symptoms worsen or fail to improve.

## 2019-07-17 NOTE — Assessment & Plan Note (Addendum)
Acute, ongoing.  Due to recent fall.  Likely strained and/or fractured.  Encouraged use of [facility] walk-in clinic hours today.  If unable to be seen today, will order imaging of left hand for definitive diagnosis.  Instructed to continue conservative treatment with rest, ice, Tylenol/ibuprofen, and elevation.  Try to immobilize finger as much as possible.  Work note given.  Advised if not improving by end of week, call or return to clinic.

## 2019-07-17 NOTE — Patient Instructions (Addendum)
[facility]: [place], [place] [address]  Boxer's Knuckle   Boxer's knuckle is an injury to an extensor tendon. The extensor tendons are located on the back of the hand. They help the fingers to extend. They also help to protect the finger bones and joints. Boxer's knuckle develops if the layer of tissue that lies over these tendons becomes damaged and causes a tendon to move out of position. Boxer's knuckle often affects the first knuckle of the middle finger. What are the causes? This condition is caused by direct or repeated injury (trauma) to a knuckle. It often happens during activities such as boxing or martial arts. What increases the risk? You are more likely to develop this condition if you:  Participate in hitting or fighting sports, such as boxing and martial arts.  Play contact sports, such as football and rugby.  Have poor strength and flexibility.  Have injured a knuckle. What are the signs or symptoms? Symptoms of this condition include:  Pain and swelling over the injured knuckle.  Difficulty straightening the affected finger.  Delay when you try to straighten the affected finger.  Tenderness when you touch the injured knuckle.  Abnormal movement of the affected tendon when you open and close your hand. How is this diagnosed? This condition is diagnosed with a physical exam. You may also have imaging tests such as X-rays or an MRI. These tests will check for additional problems such as a fracture or cyst in the bone under the injured area. How is this treated? This condition may be treated by:  Applying ice to the affected area.  Taking medicines for pain.  Placing the hand in a cast or splint to keep the injured joint from moving while the tendon heals.  Having surgery to repair the injured tendon or tissue. This may be done in severe cases. Follow these instructions at home: If you have a cast:  Do not stick anything inside  the cast to scratch your skin. Doing that increases your risk of infection.  Check the skin around the cast every day. Tell your health care provider about any concerns.  You may put lotion on dry skin around the edges of the cast. Do not put lotion on the skin underneath the cast.  Keep the cast clean and dry. If you have a splint:  Wear the splint as told by your health care provider. Remove it only as told by your health care provider.  Loosen the splint if your fingers tingle, become numb, or turn cold and blue.  Keep the splint clean and dry. Bathing  Do not take baths, swim, or use a hot tub until your health care provider approves. Ask your health care provider if you may take showers. You may only be allowed to take sponge baths.  If the cast or splint is not waterproof: ? Do not let it get wet. ? Cover it with a watertight covering when you take a bath or shower. Managing pain, stiffness, and swelling   If directed, put ice on the injured area. ? If you have a removable splint, remove it as told by your health care provider. ? Put ice in a plastic bag. ? Place a towel between your skin and the bag. ? Leave the ice on for 20 minutes, 2-3 times a day.  Move your fingers often to reduce stiffness and swelling.  Raise (elevate) the injured area above the level of your heart while you are sitting  or lying down. Activity  Do not lift anything with your injured hand until your health care provider says that it is safe.  Do not participate in hitting or fighting sports until your health care provider approves.  Do exercises as told by your health care provider. Driving  Ask your health care provider: ? If the medicine prescribed to you requires you to avoid driving or using heavy machinery. ? When it is safe to drive if you have a cast or splint on your hand. General instructions  Do not put pressure on any part of the cast or splint until it is fully hardened. This  may take several hours.  Do not use any products that contain nicotine or tobacco, such as cigarettes, e-cigarettes, and chewing tobacco. These can delay healing. If you need help quitting, ask your health care provider.  Take over-the-counter and prescription medicines only as told by your health care provider.  Keep all follow-up visits as told by your health care provider. This is important. How is this prevented?  Consider working with a Teacher, English as a foreign language to help you: ? Learn proper techniques for your sport. ? Come up with a training regimen that is safe and effective for you.  Use proper equipment for your sport. ? Use hand or wrist wraps if needed. This will help cushion your hand. ? If you wear gloves for your sport, make sure that they fit well. Contact a health care provider if:  Your pain gets worse.  Your hand tingles or feels numb.  Your hand becomes discolored. Summary  Boxer's knuckle is an injury to an extensor tendon on the back of the hand.  Boxer's knuckle often affects the first knuckle of the middle finger.  This condition is caused by direct or repeated injury to a knuckle that usually occurs during sports.  Boxer's knuckle may be treated with ice, medicines, and a cast or splint. In severe cases, surgery may be needed. This information is not intended to replace advice given to you by your health care provider. Make sure you discuss any questions you have with your health care provider. Document Revised: 12/12/2017 Document Reviewed: 12/12/2017 Elsevier Patient Education  2020 [employer].

## 2019-07-20 ENCOUNTER — Encounter: Payer: Self-pay | Admitting: Family Medicine

## 2019-07-20 ENCOUNTER — Telehealth (INDEPENDENT_AMBULATORY_CARE_PROVIDER_SITE_OTHER): Payer: 59 | Admitting: Family Medicine

## 2019-07-20 VITALS — Wt 158.0 lb

## 2019-07-20 DIAGNOSIS — F419 Anxiety disorder, unspecified: Secondary | ICD-10-CM | POA: Diagnosis not present

## 2019-07-20 MED ORDER — CLONAZEPAM 0.5 MG PO TABS: 0.5000 mg | ORAL_TABLET | Freq: Every day | ORAL | 0 refills | Status: DC | PRN

## 2019-07-20 NOTE — Progress Notes (Signed)
Wt 158 lb (71.7 kg)   BMI 24.02 kg/m    Subjective:    Patient ID: Joel Carr, male    DOB: Nov 19, 1985, 33 y.o.   MRN: [id number]  HPI: Joel Carr is a 34 y.o. male  Chief Complaint  Patient presents with  . Anxiety    . This visit was completed via MyChart due to the restrictions of the COVID-19 pandemic. All issues as above were discussed and addressed. Physical exam was done as above through visual confirmation on MyChart. If it was felt that the patient should be evaluated in the office, they were directed there. The patient verbally consented to this visit. . Location of the patient: home . Location of the provider: work . Those involved with this call:  . Provider: Merrie Roof, PA-C . CMA: Lesle Chris, [place] . Front Desk/Registration: Jill Side  . Time spent on call: 15 minutes with patient face to face via video conference. More than 50% of this time was spent in counseling and coordination of care. 5 minutes total spent in review of patient's record and preparation of their chart. I verified patient identity using two factors (patient name and date of birth). Patient consents verbally to being seen via telemedicine visit today.   Presenting today for anxiety f/u after adding prn klonopin to current regimen. Still having some bad days, but overall feeling well. Lots of stressors right now weighing on him. On bad weeks taking the klonopin 2-3 times per week but other weeks not needing to take at all. Denies side effects.   Depression screen [facility] 2/9 07/20/2019 06/01/2019 05/04/2019  Decreased Interest 0 0 0  Down, Depressed, Hopeless 0 0 0  PHQ - 2 Score 0 0 0  Altered sleeping 2 2 3   Tired, decreased energy 1 2 1   Change in appetite 0 1 0  Feeling bad or failure about yourself  0 0 0  Trouble concentrating 2 0 0  Moving slowly or fidgety/restless 0 0 0  Suicidal thoughts 0 0 0  PHQ-9 Score 5 5 4   Difficult doing work/chores Somewhat difficult Somewhat difficult  Somewhat difficult   GAD 7 : Generalized Anxiety Score 07/20/2019 06/01/2019 05/04/2019 03/29/2019  Nervous, Anxious, on Edge 1 2 2 2   Control/stop worrying 0 1 0 0  Worry too much - different things 1 1 0 1  Trouble relaxing 2 1 3 2   Restless 1 2 2 2   Easily annoyed or irritable 1 1 1 2   Afraid - awful might happen 0 0 0 0  Total GAD 7 Score 6 8 8 9   Anxiety Difficulty Somewhat difficult Somewhat difficult Somewhat difficult Somewhat difficult     Relevant past medical, surgical, family and social history reviewed and updated as indicated. Interim medical history since our last visit reviewed. Allergies and medications reviewed and updated.  Review of Systems  Per HPI unless specifically indicated above     Objective:    Wt 158 lb (71.7 kg)   BMI 24.02 kg/m   Wt Readings from Last 3 Encounters:  07/20/19 158 lb (71.7 kg)  07/02/19 162 lb 3.2 oz (73.6 kg)  06/01/19 149 lb (67.6 kg)    Physical Exam Vitals and nursing note reviewed.  Constitutional:      General: He is not in acute distress.    Appearance: Normal appearance.  HENT:     Head: Atraumatic.     Right Ear: External ear normal.     Left Ear:  External ear normal.     Nose: Nose normal. No congestion.     Mouth/Throat:     Mouth: Mucous membranes are moist.     Pharynx: Oropharynx is clear.  Eyes:     Extraocular Movements: Extraocular movements intact.     Conjunctiva/sclera: Conjunctivae normal.  Cardiovascular:     Rate and Rhythm: Normal rate and regular rhythm.  Pulmonary:     Effort: Pulmonary effort is normal. No respiratory distress.  Musculoskeletal:        General: Normal range of motion.     Cervical back: Normal range of motion.  Skin:    General: Skin is dry.     Findings: No erythema or rash.  Neurological:     Mental Status: He is oriented to person, place, and time.  Psychiatric:        Mood and Affect: Mood normal.        Thought Content: Thought content normal.        Judgment:  Judgment normal.     Results for orders placed or performed in visit on 05/11/19  Novel Coronavirus, NAA ([facility])   Specimen: Nasopharyngeal(NP) swabs in vial transport medium   NASOPHARYNGE  TESTING  Result Value Ref Range   SARS-CoV-2, NAA Not Detected Not Detected      Assessment & Plan:   Problem List Items Addressed This Visit      Other   Anxiety - Primary    Good improvement with addition of klonopin, continue current regimen with close monitoring          Follow up plan: Return in about 3 months (around 10/19/2019) for Anxiety f/u.

## 2019-07-20 NOTE — Assessment & Plan Note (Signed)
Good improvement with addition of klonopin, continue current regimen with close monitoring

## 2019-08-02 ENCOUNTER — Encounter: Payer: 59 | Admitting: Family Medicine

## 2019-08-08 ENCOUNTER — Encounter: Payer: Self-pay | Admitting: Family Medicine

## 2019-08-08 ENCOUNTER — Ambulatory Visit (INDEPENDENT_AMBULATORY_CARE_PROVIDER_SITE_OTHER): Payer: 59 | Admitting: Family Medicine

## 2019-08-08 ENCOUNTER — Other Ambulatory Visit: Payer: Self-pay

## 2019-08-08 VITALS — BP 123/69 | HR 81 | Temp 98.3°F | Ht 67.0 in | Wt 161.2 lb

## 2019-08-08 DIAGNOSIS — F419 Anxiety disorder, unspecified: Secondary | ICD-10-CM | POA: Diagnosis not present

## 2019-08-08 DIAGNOSIS — F3131 Bipolar disorder, current episode depressed, mild: Secondary | ICD-10-CM

## 2019-08-08 DIAGNOSIS — Z0000 Encounter for general adult medical examination without abnormal findings: Secondary | ICD-10-CM

## 2019-08-08 DIAGNOSIS — G2581 Restless legs syndrome: Secondary | ICD-10-CM

## 2019-08-08 DIAGNOSIS — E7800 Pure hypercholesterolemia, unspecified: Secondary | ICD-10-CM | POA: Diagnosis not present

## 2019-08-08 DIAGNOSIS — G4700 Insomnia, unspecified: Secondary | ICD-10-CM

## 2019-08-08 LAB — UA/M W/RFLX CULTURE, ROUTINE
Bilirubin, UA: NEGATIVE
Glucose, UA: NEGATIVE
Ketones, UA: NEGATIVE
Leukocytes,UA: NEGATIVE
Nitrite, UA: NEGATIVE
Protein,UA: NEGATIVE
RBC, UA: NEGATIVE
Specific Gravity, UA: 1.015 (ref 1.005–1.030)
Urobilinogen, Ur: 0.2 mg/dL (ref 0.2–1.0)
pH, UA: 7 (ref 5.0–7.5)

## 2019-08-08 MED ORDER — CLONAZEPAM 0.5 MG PO TABS: 0.5000 mg | ORAL_TABLET | Freq: Every day | ORAL | 0 refills | Status: DC | PRN

## 2019-08-08 NOTE — Progress Notes (Signed)
BP 123/69 (BP Location: Left Arm, Patient Position: Sitting, Cuff Size: Normal)   Pulse 81   Temp 98.3 F (36.8 C) (Oral)   Ht 5\' 7"  (1.702 m)   Wt 161 lb 3.2 oz (73.1 kg)   SpO2 98%   BMI 25.25 kg/m    Subjective:    Patient ID: , male    DOB: 01-Feb-1986, 34 y.o.   MRN: [id number]  HPI: Joel Carr is a 34 y.o. male presenting on 08/08/2019 for comprehensive medical examination. Current medical complaints include:see below  Migraines - much improved, hasn't needed imitrex for several weeks now.   Anxiety - taking klonopin about 3 times per week. Feeling bipolar depression and anxiousness are under good control overall with current regimen. Still dealing with significant stressors but feels Joel Carr's coping well. Denies SI/HI.   HLD - diet controlled, trying to eat well and stay active. Denies CP, SOB, claudication.   Joel Carr currently lives with: Interim Problems from his last visit: no  Depression Screen done today and results listed below:  Depression screen [facility] 2/9 07/20/2019 06/01/2019 05/04/2019 03/29/2019 09/20/2018  Decreased Interest 0 0 0 1 0  Down, Depressed, Hopeless 0 0 0 0 0  PHQ - 2 Score 0 0 0 1 0  Altered sleeping 2 2 3 2 1   Tired, decreased energy 1 2 1 2 1   Change in appetite 0 1 0 0 0  Feeling bad or failure about yourself  0 0 0 0 0  Trouble concentrating 2 0 0 1 2  Moving slowly or fidgety/restless 0 0 0 1 2  Suicidal thoughts 0 0 0 0 0  PHQ-9 Score 5 5 4 7 6   Difficult doing work/chores Somewhat difficult Somewhat difficult Somewhat difficult - -    The patient does not have a history of falls. I did complete a risk assessment for falls. A plan of care for falls was documented.   Past Medical History:  Past Medical History:  Diagnosis Date  . Bipolar disorder (HCC)   . Depression     Surgical History:  Past Surgical History:  Procedure Laterality Date  . KNEE ARTHROSCOPY WITH MENISCAL REPAIR Right 10/29/2014   Procedure: KNEE ARTHROSCOPY  WITH MENISCAL REPAIR;  Surgeon: , MD;  Location: [facility] ORS;  Service: Orthopedics;  Laterality: Right;  . KNEE SURGERY Right   . TESTICLE SURGERY N/A    as a child    Medications:  Current Outpatient Medications on File Prior to Visit  Medication Sig  . albuterol (VENTOLIN HFA) 108 (90 Base) MCG/ACT inhaler Inhale 2 puffs into the lungs every 6 (six) hours as needed for wheezing or shortness of breath.  aspirin-acetaminophen-caffeine (EXCEDRIN MIGRAINE) 250-250-65 MG tablet Take 1 tablet by mouth every 6 (six) hours as needed for headache.  . cyclobenzaprine (FLEXERIL) 10 MG tablet Take 1 tablet (10 mg total) by mouth at bedtime as needed for muscle spasms.  gabapentin (NEURONTIN) 300 MG capsule Take 2 capsules (600 mg total) by mouth 2 (two) times daily as needed.  . lamoTRIgine (LAMICTAL) 100 MG tablet Take 1 tablet (100 mg total) by mouth 2 (two) times daily.  12/29/2014 lurasidone (LATUDA) 40 MG TABS tablet Take 1 tablet (40 mg total) by mouth daily with breakfast.  . pantoprazole (PROTONIX) 20 MG tablet Take 1 tablet (20 mg total) by mouth daily.  . SUMAtriptan (IMITREX) 50 MG tablet Take 1 tab at onset of migriane.  Can take another tab in  2 hours if headache persists.  Max 2 tabs per day.  . venlafaxine XR (EFFEXOR XR) 150 MG 24 hr capsule Take 1 capsule (150 mg total) by mouth daily with breakfast.  . oxyCODONE-acetaminophen (PERCOCET/ROXICET) 5-325 MG tablet Take 1 tablet by mouth 5 (five) times daily as needed.   No current facility-administered medications on file prior to visit.    Allergies:  Allergies  Allergen Reactions  . Tramadol     Jittery    Social History:  Social History   Socioeconomic History  . Marital status: Married    Spouse name: Not on file  . Number of children: Not on file  . Years of education: Not on file  . Highest education level: Not on file  Occupational History  . Not on file  Tobacco Use  . Smoking status: Current Every Day  Smoker    Packs/day: 0.50    Years: 10.00    Pack years: 5.00    Types: Cigarettes  . Smokeless tobacco: Never Used  Substance and Sexual Activity  . Alcohol use: No  . Drug use: No  . Sexual activity: Yes  Other Topics Concern  . Not on file  Social History Narrative  . Not on file   Social Determinants of Health   Financial Resource Strain:   . Difficulty of Paying Living Expenses:   Food Insecurity:   . Worried About Programme researcher, broadcasting/film/video in the Last Year:   . Barista in the Last Year:   Transportation Needs:   . Freight forwarder (Medical):   Marland Kitchen Lack of Transportation (Non-Medical):   Physical Activity:   . Days of Exercise per Week:   . Minutes of Exercise per Session:   Stress:   . Feeling of Stress :   Social Connections:   . Frequency of Communication with Friends and Family:   . Frequency of Social Gatherings with Friends and Family:   . Attends Religious Services:   . Active Member of Clubs or Organizations:   . Attends Banker Meetings:   Marland Kitchen Marital Status:   Intimate Partner Violence:   . Fear of Current or Ex-Partner:   . Emotionally Abused:   Marland Kitchen Physically Abused:   . Sexually Abused:    Social History   Tobacco Use  Smoking Status Current Every Day Smoker  . Packs/day: 0.50  . Years: 10.00  . Pack years: 5.00  . Types: Cigarettes  Smokeless Tobacco Never Used   Social History   Substance and Sexual Activity  Alcohol Use No    Family History:  Family History  Problem Relation Age of Onset  . Diabetes Paternal Grandfather   . Heart attack Paternal Grandfather   . Colon cancer Neg Hx     Past medical history, surgical history, medications, allergies, family history and social history reviewed with patient today and changes made to appropriate areas of the chart.   Review of Systems - General ROS: negative Psychological ROS: negative Ophthalmic ROS: negative ENT ROS: negative Allergy and Immunology ROS:  negative Hematological and Lymphatic ROS: negative Endocrine ROS: negative Breast ROS: negative for breast lumps Respiratory ROS: no cough, shortness of breath, or wheezing Cardiovascular ROS: no chest pain or dyspnea on exertion Gastrointestinal ROS: no abdominal pain, change in bowel habits, or black or bloody stools Genito-Urinary ROS: no dysuria, trouble voiding, or hematuria Musculoskeletal ROS: negative Neurological ROS: no TIA or stroke symptoms Dermatological ROS: negative All other ROS negative except what  is listed above and in the HPI.      Objective:    BP 123/69 (BP Location: Left Arm, Patient Position: Sitting, Cuff Size: Normal)   Pulse 81   Temp 98.3 F (36.8 C) (Oral)   Ht 5\' 7"  (1.702 m)   Wt 161 lb 3.2 oz (73.1 kg)   SpO2 98%   BMI 25.25 kg/m   Wt Readings from Last 3 Encounters:  08/08/19 161 lb 3.2 oz (73.1 kg)  07/20/19 158 lb (71.7 kg)  07/02/19 162 lb 3.2 oz (73.6 kg)    Physical Exam Vitals and nursing note reviewed.  Constitutional:      General: Joel Carr is not in acute distress.    Appearance: Joel Carr is well-developed.  HENT:     Head: Atraumatic.     Right Ear: Tympanic membrane and external ear normal.     Left Ear: Tympanic membrane and external ear normal.     Nose: Nose normal.     Mouth/Throat:     Mouth: Mucous membranes are moist.     Pharynx: Oropharynx is clear.  Eyes:     General: No scleral icterus.    Conjunctiva/sclera: Conjunctivae normal.     Pupils: Pupils are equal, round, and reactive to light.  Cardiovascular:     Rate and Rhythm: Normal rate and regular rhythm.     Heart sounds: Normal heart sounds. No murmur.  Pulmonary:     Effort: Pulmonary effort is normal. No respiratory distress.     Breath sounds: Normal breath sounds.  Abdominal:     General: Bowel sounds are normal. There is no distension.     Palpations: Abdomen is soft. There is no mass.     Tenderness: There is no abdominal tenderness. There is no guarding.   Genitourinary:    Comments: Declines GU exam Musculoskeletal:        General: No tenderness. Normal range of motion.     Cervical back: Normal range of motion and neck supple.  Skin:    General: Skin is warm and dry.     Findings: No rash.  Neurological:     General: No focal deficit present.     Mental Status: Joel Carr is alert and oriented to person, place, and time.     Deep Tendon Reflexes: Reflexes are normal and symmetric.  Psychiatric:        Mood and Affect: Mood normal.        Behavior: Behavior normal.        Thought Content: Thought content normal.        Judgment: Judgment normal.     Results for orders placed or performed in visit on 08/08/19  CBC with Differential/Platelet  Result Value Ref Range   WBC 13.1 (H) 3.4 - 10.8 x10E3/uL   RBC 5.12 4.14 - 5.80 x10E6/uL   Hemoglobin 16.1 13.0 - 17.7 g/dL   Hematocrit 46.0 37.5 - 51.0 %   MCV 90 79 - 97 fL   MCH 31.4 26.6 - 33.0 pg   MCHC 35.0 31.5 - 35.7 g/dL   RDW 12.8 11.6 - 15.4 %   Platelets 219 150 - 450 x10E3/uL   Neutrophils 69 Not Estab. %   Lymphs 25 Not Estab. %   Monocytes 4 Not Estab. %   Eos 1 Not Estab. %   Basos 0 Not Estab. %   Neutrophils Absolute 9.1 (H) 1.4 - 7.0 x10E3/uL   Lymphocytes Absolute 3.2 (H) 0.7 - 3.1 x10E3/uL   Monocytes  Absolute 0.5 0.1 - 0.9 x10E3/uL   EOS (ABSOLUTE) 0.1 0.0 - 0.4 x10E3/uL   Basophils Absolute 0.0 0.0 - 0.2 x10E3/uL   Immature Granulocytes 1 Not Estab. %   Immature Grans (Abs) 0.1 0.0 - 0.1 x10E3/uL  Comprehensive metabolic panel  Result Value Ref Range   Glucose 73 65 - 99 mg/dL   BUN 9 6 - 20 mg/dL   Creatinine, Ser 1.190.85 0.76 - 1.27 mg/dL   GFR calc non Af Amer 113 >59 mL/min/1.73   GFR calc Af Amer 131 >59 mL/min/1.73   BUN/Creatinine Ratio 11 9 - 20   Sodium 141 134 - 144 mmol/L   Potassium 4.1 3.5 - 5.2 mmol/L   Chloride 101 96 - 106 mmol/L   CO2 25 20 - 29 mmol/L   Calcium 9.8 8.7 - 10.2 mg/dL   Total Protein 6.9 6.0 - 8.5 g/dL   Albumin 4.8 4.0 - 5.0  g/dL   Globulin, Total 2.1 1.5 - 4.5 g/dL   Albumin/Globulin Ratio 2.3 (H) 1.2 - 2.2   Bilirubin Total <0.2 0.0 - 1.2 mg/dL   Alkaline Phosphatase 80 39 - 117 IU/L   AST 19 0 - 40 IU/L   ALT 17 0 - 44 IU/L  Lipid Panel w/o Chol/HDL Ratio  Result Value Ref Range   Cholesterol, Total 261 (H) 100 - 199 mg/dL   Triglycerides 147287 (H) 0 - 149 mg/dL   HDL 44 >82>39 mg/dL   VLDL Cholesterol Cal 54 (H) 5 - 40 mg/dL   LDL Chol Calc (NIH) 956163 (H) 0 - 99 mg/dL  TSH  Result Value Ref Range   TSH 0.663 0.450 - 4.500 uIU/mL  UA/M w/rflx Culture, Routine   Specimen: Urine   URINE  Result Value Ref Range   Specific Gravity, UA 1.015 1.005 - 1.030   pH, UA 7.0 5.0 - 7.5   Color, UA Yellow Yellow   Appearance Ur Clear Clear   Leukocytes,UA Negative Negative   Protein,UA Negative Negative/Trace   Glucose, UA Negative Negative   Ketones, UA Negative Negative   RBC, UA Negative Negative   Bilirubin, UA Negative Negative   Urobilinogen, Ur 0.2 0.2 - 1.0 mg/dL   Nitrite, UA Negative Negative      Assessment & Plan:   Problem List Items Addressed This Visit      Other   Bipolar disorder (HCC)    Overall stable and under good control with current regimen. Continue      Hyperlipidemia - Primary    Recheck lipids, adjust as needed. Continue working on lifestyle modifications for conttrol      Relevant Orders   Comprehensive metabolic panel (Completed)   Lipid Panel w/o Chol/HDL Ratio (Completed)   RLS (restless legs syndrome)    Stable on prn gabapentin, continue current regimen      Insomnia    Stable, continue current regimen and good sleep hygiene      Anxiety    Under better control, continue current regimen with rare prn use of klonopin. Goal is for 1 script to last 6 months      Relevant Orders   TSH (Completed)    Other Visit Diagnoses    Annual physical exam       Relevant Orders   CBC with Differential/Platelet (Completed)   UA/M w/rflx Culture, Routine (Completed)         Discussed aspirin prophylaxis for myocardial infarction prevention and decision was it was not indicated  LABORATORY TESTING:  Health  maintenance labs ordered today as discussed above.   The natural history of prostate cancer and ongoing controversy regarding screening and potential treatment outcomes of prostate cancer has been discussed with the patient. The meaning of a false positive PSA and a false negative PSA has been discussed. Joel Carr indicates understanding of the limitations of this screening test and wishes not to proceed with screening PSA testing.   IMMUNIZATIONS:   - Tdap: Tetanus vaccination status reviewed: last tetanus booster within 10 years. - Influenza: Postponed to flu season  PATIENT COUNSELING:    Sexuality: Discussed sexually transmitted diseases, partner selection, use of condoms, avoidance of unintended pregnancy  and contraceptive alternatives.   Advised to avoid cigarette smoking.  I discussed with the patient that most people either abstain from alcohol or drink within safe limits (<=14/week and <=4 drinks/occasion for males, <=7/weeks and <= 3 drinks/occasion for females) and that the risk for alcohol disorders and other health effects rises proportionally with the number of drinks per week and how often a drinker exceeds daily limits.  Discussed cessation/primary prevention of drug use and availability of treatment for abuse.   Diet: Encouraged to adjust caloric intake to maintain  or achieve ideal body weight, to reduce intake of dietary saturated fat and total fat, to limit sodium intake by avoiding high sodium foods and not adding table salt, and to maintain adequate dietary potassium and calcium preferably from fresh fruits, vegetables, and low-fat dairy products.    stressed the importance of regular exercise  Injury prevention: Discussed safety belts, safety helmets, smoke detector, smoking near bedding or upholstery.   Dental health: Discussed  importance of regular tooth brushing, flossing, and dental visits.   Follow up plan: NEXT PREVENTATIVE PHYSICAL DUE IN 1 YEAR. Return in about 6 months (around 02/08/2020) for 6 month f/u.

## 2019-08-09 LAB — COMPREHENSIVE METABOLIC PANEL
ALT: 17 IU/L (ref 0–44)
AST: 19 IU/L (ref 0–40)
Albumin/Globulin Ratio: 2.3 — ABNORMAL HIGH (ref 1.2–2.2)
Albumin: 4.8 g/dL (ref 4.0–5.0)
Alkaline Phosphatase: 80 IU/L (ref 39–117)
BUN/Creatinine Ratio: 11 (ref 9–20)
BUN: 9 mg/dL (ref 6–20)
Bilirubin Total: <0.2 mg/dL (ref 0.0–1.2)
CO2: 25 mmol/L (ref 20–29)
Calcium: 9.8 mg/dL (ref 8.7–10.2)
Chloride: 101 mmol/L (ref 96–106)
Creatinine, Ser: 0.85 mg/dL (ref 0.76–1.27)
GFR calc Af Amer: 131 mL/min/{1.73_m2} (ref >59)
GFR calc non Af Amer: 113 mL/min/{1.73_m2} (ref >59)
Globulin, Total: 2.1 g/dL (ref 1.5–4.5)
Glucose: 73 mg/dL (ref 65–99)
Potassium: 4.1 mmol/L (ref 3.5–5.2)
Sodium: 141 mmol/L (ref 134–144)
Total Protein: 6.9 g/dL (ref 6.0–8.5)

## 2019-08-09 LAB — CBC WITH DIFFERENTIAL/PLATELET
Basophils Absolute: 0 10*3/uL (ref 0.0–0.2)
Basos: 0 %
EOS (ABSOLUTE): 0.1 10*3/uL (ref 0.0–0.4)
Eos: 1 %
Hematocrit: 46 % (ref 37.5–51.0)
Hemoglobin: 16.1 g/dL (ref 13.0–17.7)
Immature Grans (Abs): 0.1 10*3/uL (ref 0.0–0.1)
Immature Granulocytes: 1 %
Lymphocytes Absolute: 3.2 10*3/uL — ABNORMAL HIGH (ref 0.7–3.1)
Lymphs: 25 %
MCH: 31.4 pg (ref 26.6–33.0)
MCHC: 35 g/dL (ref 31.5–35.7)
MCV: 90 fL (ref 79–97)
Monocytes Absolute: 0.5 10*3/uL (ref 0.1–0.9)
Monocytes: 4 %
Neutrophils Absolute: 9.1 10*3/uL — ABNORMAL HIGH (ref 1.4–7.0)
Neutrophils: 69 %
Platelets: 219 10*3/uL (ref 150–450)
RBC: 5.12 x10E6/uL (ref 4.14–5.80)
RDW: 12.8 % (ref 11.6–15.4)
WBC: 13.1 10*3/uL — ABNORMAL HIGH (ref 3.4–10.8)

## 2019-08-09 LAB — LIPID PANEL W/O CHOL/HDL RATIO
Cholesterol, Total: 261 mg/dL — ABNORMAL HIGH (ref 100–199)
HDL: 44 mg/dL (ref >39)
LDL Chol Calc (NIH): 163 mg/dL — ABNORMAL HIGH (ref 0–99)
Triglycerides: 287 mg/dL — ABNORMAL HIGH (ref 0–149)
VLDL Cholesterol Cal: 54 mg/dL — ABNORMAL HIGH (ref 5–40)

## 2019-08-09 LAB — TSH: TSH: 0.663 u[IU]/mL (ref 0.450–4.500)

## 2019-08-12 ENCOUNTER — Encounter: Payer: Self-pay | Admitting: Family Medicine

## 2019-08-13 ENCOUNTER — Encounter: Payer: Self-pay | Admitting: Family Medicine

## 2019-08-14 ENCOUNTER — Other Ambulatory Visit: Payer: Self-pay | Admitting: Family Medicine

## 2019-08-14 DIAGNOSIS — R7989 Other specified abnormal findings of blood chemistry: Secondary | ICD-10-CM

## 2019-08-20 ENCOUNTER — Encounter: Payer: Self-pay | Admitting: Family Medicine

## 2019-08-20 NOTE — Assessment & Plan Note (Signed)
Under better control, continue current regimen with rare prn use of klonopin. Goal is for 1 script to last 6 months

## 2019-08-20 NOTE — Assessment & Plan Note (Signed)
Recheck lipids, adjust as needed. Continue working on lifestyle modifications for conttrol

## 2019-08-20 NOTE — Assessment & Plan Note (Signed)
Overall stable and under good control with current regimen. Continue

## 2019-08-20 NOTE — Assessment & Plan Note (Signed)
Stable, continue current regimen and good sleep hygiene

## 2019-08-20 NOTE — Assessment & Plan Note (Signed)
Stable on prn gabapentin, continue current regimen

## 2019-08-21 ENCOUNTER — Other Ambulatory Visit: Payer: Self-pay | Admitting: Family Medicine

## 2019-08-21 DIAGNOSIS — G44209 Tension-type headache, unspecified, not intractable: Secondary | ICD-10-CM

## 2019-08-21 MED ORDER — SUMATRIPTAN SUCCINATE 50 MG PO TABS: ORAL_TABLET | ORAL | 2 refills | Status: DC

## 2019-08-28 ENCOUNTER — Other Ambulatory Visit: Payer: Self-pay | Admitting: Family Medicine

## 2019-08-28 ENCOUNTER — Encounter: Payer: Self-pay | Admitting: Family Medicine

## 2019-08-28 DIAGNOSIS — G44209 Tension-type headache, unspecified, not intractable: Secondary | ICD-10-CM

## 2019-08-28 DIAGNOSIS — R109 Unspecified abdominal pain: Secondary | ICD-10-CM

## 2019-08-28 MED ORDER — CYCLOBENZAPRINE HCL 10 MG PO TABS: 10.0000 mg | ORAL_TABLET | Freq: Every evening | ORAL | 0 refills | Status: DC | PRN

## 2019-08-28 MED ORDER — GABAPENTIN 300 MG PO CAPS: 600.0000 mg | ORAL_CAPSULE | Freq: Two times a day (BID) | ORAL | 1 refills | Status: DC | PRN

## 2019-09-13 ENCOUNTER — Encounter: Payer: Self-pay | Admitting: Family Medicine

## 2019-09-17 ENCOUNTER — Other Ambulatory Visit: Payer: Self-pay | Admitting: Family Medicine

## 2019-09-17 MED ORDER — CLONAZEPAM 0.5 MG PO TABS: 0.5000 mg | ORAL_TABLET | Freq: Every day | ORAL | 0 refills | Status: DC | PRN

## 2019-09-18 ENCOUNTER — Other Ambulatory Visit: Payer: Self-pay

## 2019-09-18 ENCOUNTER — Encounter: Payer: Self-pay | Admitting: Family Medicine

## 2019-10-04 ENCOUNTER — Encounter: Payer: Self-pay | Admitting: Family Medicine

## 2019-10-04 ENCOUNTER — Telehealth (INDEPENDENT_AMBULATORY_CARE_PROVIDER_SITE_OTHER): Payer: 59 | Admitting: Nurse Practitioner

## 2019-10-04 ENCOUNTER — Encounter: Payer: Self-pay | Admitting: Nurse Practitioner

## 2019-10-04 ENCOUNTER — Other Ambulatory Visit: Payer: Self-pay

## 2019-10-04 VITALS — Ht 68.0 in | Wt 160.0 lb

## 2019-10-04 DIAGNOSIS — J0100 Acute maxillary sinusitis, unspecified: Secondary | ICD-10-CM | POA: Insufficient documentation

## 2019-10-04 MED ORDER — AMOXICILLIN 875 MG PO TABS: 875.0000 mg | ORAL_TABLET | Freq: Two times a day (BID) | ORAL | 0 refills | Status: DC

## 2019-10-04 NOTE — Assessment & Plan Note (Addendum)
Acute, ongoing.  Given length of symptoms, likely viral illness that is turning bacterial.  Will treat with antibiotics x five days.  If not better by Monday, will consider steroids.  Encouraged to obtain COVID testing.

## 2019-10-04 NOTE — Patient Instructions (Signed)
Sinusitis, Adult Sinusitis is inflammation of your sinuses. Sinuses are hollow spaces in the bones around your face. Your sinuses are located:  Around your eyes.  In the middle of your forehead.  Behind your nose.  In your cheekbones. Mucus normally drains out of your sinuses. When your nasal tissues become inflamed or swollen, mucus can become trapped or blocked. This allows bacteria, viruses, and fungi to grow, which leads to infection. Most infections of the sinuses are caused by a virus. Sinusitis can develop quickly. It can last for up to 4 weeks (acute) or for more than 12 weeks (chronic). Sinusitis often develops after a cold. What are the causes? This condition is caused by anything that creates swelling in the sinuses or stops mucus from draining. This includes:  Allergies.  Asthma.  Infection from bacteria or viruses.  Deformities or blockages in your nose or sinuses.  Abnormal growths in the nose (nasal polyps).  Pollutants, such as chemicals or irritants in the air.  Infection from fungi (rare). What increases the risk? You are more likely to develop this condition if you:  Have a weak body defense system (immune system).  Do a lot of swimming or diving.  Overuse nasal sprays.  Smoke. What are the signs or symptoms? The main symptoms of this condition are pain and a feeling of pressure around the affected sinuses. Other symptoms include:  Stuffy nose or congestion.  Thick drainage from your nose.  Swelling and warmth over the affected sinuses.  Headache.  Upper toothache.  A cough that may get worse at night.  Extra mucus that collects in the throat or the back of the nose (postnasal drip).  Decreased sense of smell and taste.  Fatigue.  A fever.  Sore throat.  Bad breath. How is this diagnosed? This condition is diagnosed based on:  Your symptoms.  Your medical history.  A physical exam.  Tests to find out if your condition is  acute or chronic. This may include: ? Checking your nose for nasal polyps. ? Viewing your sinuses using a device that has a light (endoscope). ? Testing for allergies or bacteria. ? Imaging tests, such as an MRI or CT scan. In rare cases, a bone biopsy may be done to rule out more serious types of fungal sinus disease. How is this treated? Treatment for sinusitis depends on the cause and whether your condition is chronic or acute.  If caused by a virus, your symptoms should go away on their own within 10 days. You may be given medicines to relieve symptoms. They include: ? Medicines that shrink swollen nasal passages (topical intranasal decongestants). ? Medicines that treat allergies (antihistamines). ? A spray that eases inflammation of the nostrils (topical intranasal corticosteroids). ? Rinses that help get rid of thick mucus in your nose (nasal saline washes).  If caused by bacteria, your health care provider may recommend waiting to see if your symptoms improve. Most bacterial infections will get better without antibiotic medicine. You may be given antibiotics if you have: ? A severe infection. ? A weak immune system.  If caused by narrow nasal passages or nasal polyps, you may need to have surgery. Follow these instructions at home: Medicines  Take, use, or apply over-the-counter and prescription medicines only as told by your health care provider. These may include nasal sprays.  If you were prescribed an antibiotic medicine, take it as told by your health care provider. Do not stop taking the antibiotic even if you start   to feel better. Hydrate and humidify   Drink enough fluid to keep your urine pale yellow. Staying hydrated will help to thin your mucus.  Use a cool mist humidifier to keep the humidity level in your home above 50%.  Inhale steam for 10-15 minutes, 3-4 times a day, or as told by your health care provider. You can do this in the bathroom while a hot shower is  running.  Limit your exposure to cool or dry air. Rest  Rest as much as possible.  Sleep with your head raised (elevated).  Make sure you get enough sleep each night. General instructions   Apply a warm, moist washcloth to your face 3-4 times a day or as told by your health care provider. This will help with discomfort.  Wash your hands often with soap and water to reduce your exposure to germs. If soap and water are not available, use hand sanitizer.  Do not smoke. Avoid being around people who are smoking (secondhand smoke).  Keep all follow-up visits as told by your health care provider. This is important. Contact a health care provider if:  You have a fever.  Your symptoms get worse.  Your symptoms do not improve within 10 days. Get help right away if:  You have a severe headache.  You have persistent vomiting.  You have severe pain or swelling around your face or eyes.  You have vision problems.  You develop confusion.  Your neck is stiff.  You have trouble breathing. Summary  Sinusitis is soreness and inflammation of your sinuses. Sinuses are hollow spaces in the bones around your face.  This condition is caused by nasal tissues that become inflamed or swollen. The swelling traps or blocks the flow of mucus. This allows bacteria, viruses, and fungi to grow, which leads to infection.  If you were prescribed an antibiotic medicine, take it as told by your health care provider. Do not stop taking the antibiotic even if you start to feel better.  Keep all follow-up visits as told by your health care provider. This is important. This information is not intended to replace advice given to you by your health care provider. Make sure you discuss any questions you have with your health care provider. Document Revised: 08/15/2017 Document Reviewed: 08/15/2017 Elsevier Patient Education  2020 [employer]  

## 2019-10-04 NOTE — Progress Notes (Signed)
Ht 5\' 8"  (1.727 m)   Wt 160 lb (72.6 kg)   BMI 24.33 kg/m    Subjective:    Patient ID: , male    DOB: [birth year], 34 y.o.   MRN: [id number]  HPI: Joel Carr is a 34 y.o. male  Chief Complaint  Patient presents with  . Cough    Symptoms been ongoing appx 1 week.   . Shortness of Breath  . Nasal Congestion   UPPER RESPIRATORY TRACT INFECTION Repots he has not been tested for COVID. Onset: 1 weeks Worst symptom: shortness of breath Fever: no  Body aches: yes Cough: yes - productive with yellow/green sputum Shortness of breath: yes - has been using albuterol 3-4 times per day, helps with SOB somewhat Wheezing: yes; especially at night Chest pain: no Chest tightness: yes Chest congestion: yes Nasal congestion: yes Runny nose: no Post nasal drip: yes Sneezing: no Sore throat: yes Swollen glands: no Sinus pressure: yes - left maxillary area Headache: yes - dull Face pain: no Toothache: no Ear pain: no  Ear pressure: no  Eyes red/itching:no Eye drainage/crusting: no  Nausea: yes Vomiting: no  Appetite: normal Rash: no Fatigue: no Sick contacts: yes - friend at work Strep contacts: no  Context: fluctuating Recurrent sinusitis: no Relief with OTC cold/cough medications:  Mucinex Treatments attempted: Mucinex   Allergies  Allergen Reactions  . Tramadol     Jittery   Outpatient Encounter Medications as of 10/04/2019  Medication Sig  . albuterol (VENTOLIN HFA) 108 (90 Base) MCG/ACT inhaler Inhale 2 puffs into the lungs every 6 (six) hours as needed for wheezing or shortness of breath.  12/05/2019 aspirin-acetaminophen-caffeine (EXCEDRIN MIGRAINE) 250-250-65 MG tablet Take 1 tablet by mouth every 6 (six) hours as needed for headache.  . clonazePAM (KLONOPIN) 0.5 MG tablet Take 1 tablet (0.5 mg total) by mouth daily as needed for anxiety.  . cyclobenzaprine (FLEXERIL) 10 MG tablet Take 1 tablet (10 mg total) by mouth at bedtime as needed for muscle spasms.    Marland Kitchen gabapentin (NEURONTIN) 300 MG capsule Take 2 capsules (600 mg total) by mouth 2 (two) times daily as needed.  . lamoTRIgine (LAMICTAL) 100 MG tablet Take 1 tablet (100 mg total) by mouth 2 (two) times daily.  Marland Kitchen lurasidone (LATUDA) 40 MG TABS tablet Take 1 tablet (40 mg total) by mouth daily with breakfast.  . oxyCODONE-acetaminophen (PERCOCET/ROXICET) 5-325 MG tablet Take 1 tablet by mouth 5 (five) times daily as needed.  . pantoprazole (PROTONIX) 20 MG tablet Take 1 tablet (20 mg total) by mouth daily.  . SUMAtriptan (IMITREX) 50 MG tablet Take 1 tab at onset of migriane.  Can take another tab in 2 hours if headache persists.  Max 2 tabs per day.  . venlafaxine XR (EFFEXOR XR) 150 MG 24 hr capsule Take 1 capsule (150 mg total) by mouth daily with breakfast.  . amoxicillin (AMOXIL) 875 MG tablet Take 1 tablet (875 mg total) by mouth 2 (two) times daily.   No facility-administered encounter medications on file as of 10/04/2019.   Patient Active Problem List   Diagnosis Date Noted  . Acute non-recurrent maxillary sinusitis 10/04/2019  . Finger pain, left 07/17/2019  . Flank pain 05/08/2019  . Tension headache 05/08/2019  . Anxiety 10/23/2018  . GERD (gastroesophageal reflux disease) 07/07/2018  . Insomnia 07/07/2018  . Nodule of skin of abdomen 02/08/2018  . Impingement syndrome of shoulder region 10/10/2017  . RLS (restless legs syndrome) 02/02/2017  .  Hyperlipidemia 04/10/2015  . Bipolar disorder (HCC) 01/01/2015   Past Medical History:  Diagnosis Date  . Bipolar disorder (HCC)   . Depression    Relevant past medical, surgical, family and social history reviewed and updated as indicated. Interim medical history since our last visit reviewed.  Review of Systems  Constitutional: Negative.  Negative for activity change, appetite change, chills, fatigue and fever.  HENT: Positive for congestion, postnasal drip, sinus pressure and sinus pain. Negative for dental problem, ear  discharge, ear pain, facial swelling, rhinorrhea, sneezing, sore throat and trouble swallowing.   Eyes: Negative.  Negative for pain, discharge, redness and itching.  Respiratory: Positive for cough, chest tightness, shortness of breath and wheezing.   Cardiovascular: Negative.   Gastrointestinal: Positive for nausea. Negative for constipation and vomiting.  Musculoskeletal: Positive for myalgias. Negative for arthralgias and back pain.  Skin: Negative.  Negative for rash.  Neurological: Positive for headaches. Negative for dizziness, weakness and light-headedness.  Psychiatric/Behavioral: Negative.  Negative for confusion. The patient is not nervous/anxious.     Per HPI unless specifically indicated above     Objective:    Ht 5\' 8"  (1.727 m)   Wt 160 lb (72.6 kg)   BMI 24.33 kg/m   Wt Readings from Last 3 Encounters:  10/04/19 160 lb (72.6 kg)  08/08/19 161 lb 3.2 oz (73.1 kg)  07/20/19 158 lb (71.7 kg)    Physical Exam Vitals and nursing note reviewed.  Constitutional:      General: He is not in acute distress.    Appearance: Normal appearance.  HENT:     Head: Normocephalic and atraumatic.     Right Ear: External ear normal.     Left Ear: External ear normal.     Nose: Congestion present.     Mouth/Throat:     Mouth: Mucous membranes are moist.     Pharynx: Oropharynx is clear. No oropharyngeal exudate.  Eyes:     General: No scleral icterus.    Extraocular Movements: Extraocular movements intact.  Cardiovascular:     Comments: Unable to assess heart sounds via virtual visit Pulmonary:     Effort: Pulmonary effort is normal. No respiratory distress.     Comments: Unable to assess lung sounds via virtual visit Skin:    Coloration: Skin is not jaundiced or pale.  Neurological:     General: No focal deficit present.     Mental Status: He is alert and oriented to person, place, and time.     Motor: No weakness.     Gait: Gait normal.  Psychiatric:        Mood and  Affect: Mood normal.        Behavior: Behavior normal.        Thought Content: Thought content normal.        Judgment: Judgment normal.       Assessment & Plan:   Problem List Items Addressed This Visit      Respiratory   Acute non-recurrent maxillary sinusitis - Primary    Acute, ongoing.  Given length of symptoms, likely viral illness that is turning bacterial.  Will treat with antibiotics x five days.  If not better by Monday, will consider steroids.  Encouraged to obtain COVID testing.      Relevant Medications   amoxicillin (AMOXIL) 875 MG tablet       Follow up plan: Return if symptoms worsen or fail to improve.  Due to the catastrophic nature of the COVID-19 pandemic,  this visit was completed via audio and visual contact via Mychart due to the restrictions of the COVID-19 pandemic. All issues as above were discussed and addressed. Physical exam was done as above through visual confirmation on Mychart. If it was felt that the patient should be evaluated in the office, they were directed there. The patient verbally consented to this visit."} . Location of the patient: home . Location of the provider: work . Those involved with this call:  . Provider: Mardene Celeste, DNP . CMA: Myrtha Mantis, CMA . Front Desk/Registration: Solicitor, CMA  . Time spent on call: 8 minutes on the phone discussing health concerns. 15 minutes total spent in review of patient's record and preparation of their chart.  I verified patient identity using two factors (patient name and date of birth). Patient consents verbally to being seen via telemedicine visit today.

## 2019-10-08 ENCOUNTER — Encounter: Payer: Self-pay | Admitting: Nurse Practitioner

## 2019-10-15 ENCOUNTER — Other Ambulatory Visit: Payer: Self-pay | Admitting: Family Medicine

## 2019-10-15 ENCOUNTER — Encounter: Payer: Self-pay | Admitting: Family Medicine

## 2019-10-15 DIAGNOSIS — R10A0 Flank pain, unspecified side: Secondary | ICD-10-CM

## 2019-10-15 DIAGNOSIS — R109 Unspecified abdominal pain: Secondary | ICD-10-CM

## 2019-10-15 DIAGNOSIS — G44209 Tension-type headache, unspecified, not intractable: Secondary | ICD-10-CM

## 2019-10-15 MED ORDER — CYCLOBENZAPRINE HCL 10 MG PO TABS: 10.0000 mg | ORAL_TABLET | Freq: Every evening | ORAL | 0 refills | Status: DC | PRN

## 2019-10-19 ENCOUNTER — Encounter: Payer: Self-pay | Admitting: Family Medicine

## 2019-10-22 ENCOUNTER — Telehealth (INDEPENDENT_AMBULATORY_CARE_PROVIDER_SITE_OTHER): Payer: 59 | Admitting: Family Medicine

## 2019-10-22 ENCOUNTER — Encounter: Payer: Self-pay | Admitting: Family Medicine

## 2019-10-22 VITALS — Wt 159.0 lb

## 2019-10-22 DIAGNOSIS — J069 Acute upper respiratory infection, unspecified: Secondary | ICD-10-CM | POA: Diagnosis not present

## 2019-10-22 MED ORDER — PREDNISONE 10 MG PO TABS: ORAL_TABLET | ORAL | 0 refills | Status: DC

## 2019-10-22 NOTE — Progress Notes (Signed)
Wt 159 lb (72.1 kg)   BMI 24.18 kg/m    Subjective:    Patient ID: Joel Carr, male    DOB: 07/01/1985, 34 y.o.   MRN: [id number]  HPI: Joel Carr is a 34 y.o. male  Chief Complaint  Patient presents with  . Generalized Body Aches    sypmtoms started last Friday  . Chills    . This visit was completed via MyChart due to the restrictions of the COVID-19 pandemic. All issues as above were discussed and addressed. Physical exam was done as above through visual confirmation on MyChart. If it was felt that the patient should be evaluated in the office, they were directed there. The patient verbally consented to this visit. . Location of the patient: work . Location of the provider: work . Those involved with this call:  . Provider: Roosvelt Maser, PA-C . CMA: Elton Sin, CMA . Front Desk/Registration: Adela Ports  . Time spent on call: 20 minutes with patient face to face via video conference. More than 50% of this time was spent in counseling and coordination of care. 5 minutes total spent in review of patient's record and preparation of their chart. I verified patient identity using two factors (patient name and date of birth). Patient consents verbally to being seen via telemedicine visit today.   Presenting today for about 4 days of body aches, chills, cough, SOB, fatigue. COVID neg at [facility] over weekend, diagnosed with other viral illness and told to practice supportive home care. Taking OTC cold medicine without much relief. Was on some amoxicillin several weeks ago for sinusitis which seemed to resolve. No known hx of pulmonary dz, sick contacts, recent travel, CP, N/V/D.   Relevant past medical, surgical, family and social history reviewed and updated as indicated. Interim medical history since our last visit reviewed. Allergies and medications reviewed and updated.  Review of Systems  Per HPI unless specifically indicated above     Objective:    Wt 159 lb (72.1  kg)   BMI 24.18 kg/m   Wt Readings from Last 3 Encounters:  10/24/19 158 lb 15.2 oz (72.1 kg)  10/22/19 159 lb (72.1 kg)  10/04/19 160 lb (72.6 kg)    Physical Exam Vitals and nursing note reviewed.  Constitutional:      General: He is not in acute distress.    Appearance: Normal appearance.  HENT:     Head: Atraumatic.     Right Ear: External ear normal.     Left Ear: External ear normal.     Nose: Nose normal. No congestion.     Mouth/Throat:     Mouth: Mucous membranes are moist.     Pharynx: Posterior oropharyngeal erythema present.  Eyes:     Extraocular Movements: Extraocular movements intact.     Conjunctiva/sclera: Conjunctivae normal.  Pulmonary:     Effort: Pulmonary effort is normal. No respiratory distress.  Musculoskeletal:        General: Normal range of motion.     Cervical back: Normal range of motion.  Skin:    General: Skin is dry.     Findings: No erythema or rash.  Neurological:     Mental Status: He is oriented to person, place, and time.  Psychiatric:        Mood and Affect: Mood normal.        Thought Content: Thought content normal.        Judgment: Judgment normal.     Results  for orders placed or performed in visit on 08/08/19  CBC with Differential/Platelet  Result Value Ref Range   WBC 13.1 (H) 3.4 - 10.8 x10E3/uL   RBC 5.12 4.14 - 5.80 x10E6/uL   Hemoglobin 16.1 13.0 - 17.7 g/dL   Hematocrit 18.2 99.3 - 51.0 %   MCV 90 79 - 97 fL   MCH 31.4 26.6 - 33.0 pg   MCHC 35.0 31 - 35 g/dL   RDW 71.6 96.7 - 89.3 %   Platelets 219 150 - 450 x10E3/uL   Neutrophils 69 Not Estab. %   Lymphs 25 Not Estab. %   Monocytes 4 Not Estab. %   Eos 1 Not Estab. %   Basos 0 Not Estab. %   Neutrophils Absolute 9.1 (H) 1 - 7 x10E3/uL   Lymphocytes Absolute 3.2 (H) 0 - 3 x10E3/uL   Monocytes Absolute 0.5 0 - 0 x10E3/uL   EOS (ABSOLUTE) 0.1 0.0 - 0.4 x10E3/uL   Basophils Absolute 0.0 0 - 0 x10E3/uL   Immature Granulocytes 1 Not Estab. %   Immature Grans  (Abs) 0.1 0.0 - 0.1 x10E3/uL  Comprehensive metabolic panel  Result Value Ref Range   Glucose 73 65 - 99 mg/dL   BUN 9 6 - 20 mg/dL   Creatinine, Ser 8.10 0.76 - 1.27 mg/dL   GFR calc non Af Amer 113 >59 mL/min/1.73   GFR calc Af Amer 131 >59 mL/min/1.73   BUN/Creatinine Ratio 11 9 - 20   Sodium 141 134 - 144 mmol/L   Potassium 4.1 3.5 - 5.2 mmol/L   Chloride 101 96 - 106 mmol/L   CO2 25 20 - 29 mmol/L   Calcium 9.8 8.7 - 10.2 mg/dL   Total Protein 6.9 6.0 - 8.5 g/dL   Albumin 4.8 4.0 - 5.0 g/dL   Globulin, Total 2.1 1.5 - 4.5 g/dL   Albumin/Globulin Ratio 2.3 (H) 1.2 - 2.2   Bilirubin Total <0.2 0.0 - 1.2 mg/dL   Alkaline Phosphatase 80 39 - 117 IU/L   AST 19 0 - 40 IU/L   ALT 17 0 - 44 IU/L  Lipid Panel w/o Chol/HDL Ratio  Result Value Ref Range   Cholesterol, Total 261 (H) 100 - 199 mg/dL   Triglycerides 175 (H) 0 - 149 mg/dL   HDL 44 >10 mg/dL   VLDL Cholesterol Cal 54 (H) 5 - 40 mg/dL   LDL Chol Calc (NIH) 258 (H) 0 - 99 mg/dL  TSH  Result Value Ref Range   TSH 0.663 0.450 - 4.500 uIU/mL  UA/M w/rflx Culture, Routine   Specimen: Urine   URINE  Result Value Ref Range   Specific Gravity, UA 1.015 1.005 - 1.030   pH, UA 7.0 5.0 - 7.5   Color, UA Yellow Yellow   Appearance Ur Clear Clear   Leukocytes,UA Negative Negative   Protein,UA Negative Negative/Trace   Glucose, UA Negative Negative   Ketones, UA Negative Negative   RBC, UA Negative Negative   Bilirubin, UA Negative Negative   Urobilinogen, Ur 0.2 0.2 - 1.0 mg/dL   Nitrite, UA Negative Negative      Assessment & Plan:   Problem List Items Addressed This Visit    None    Visit Diagnoses    Viral URI with cough    -  Primary   COVID neg, but still appears viral. Tx with prednisone, albuterol prn, mucinex and OTC cough remedies. F/u if worsening or not resolving       Follow  up plan: Return if symptoms worsen or fail to improve.

## 2019-10-24 ENCOUNTER — Encounter: Payer: Self-pay | Admitting: Family Medicine

## 2019-10-24 ENCOUNTER — Encounter: Payer: Self-pay | Admitting: Emergency Medicine

## 2019-10-24 ENCOUNTER — Ambulatory Visit: Payer: Self-pay | Admitting: *Deleted

## 2019-10-24 ENCOUNTER — Other Ambulatory Visit: Payer: Self-pay

## 2019-10-24 ENCOUNTER — Emergency Department: Payer: 59

## 2019-10-24 ENCOUNTER — Emergency Department
Admission: EM | Admit: 2019-10-24 | Discharge: 2019-10-24 | Disposition: A | Discharge status: Home / Self Care | Payer: 59 | Attending: Emergency Medicine | Admitting: Emergency Medicine

## 2019-10-24 DIAGNOSIS — H538 Other visual disturbances: Secondary | ICD-10-CM | POA: Diagnosis not present

## 2019-10-24 DIAGNOSIS — R509 Fever, unspecified: Secondary | ICD-10-CM | POA: Insufficient documentation

## 2019-10-24 DIAGNOSIS — B349 Viral infection, unspecified: Secondary | ICD-10-CM

## 2019-10-24 DIAGNOSIS — R0602 Shortness of breath: Secondary | ICD-10-CM | POA: Diagnosis present

## 2019-10-24 DIAGNOSIS — F17210 Nicotine dependence, cigarettes, uncomplicated: Secondary | ICD-10-CM | POA: Diagnosis not present

## 2019-10-24 DIAGNOSIS — Z79899 Other long term (current) drug therapy: Secondary | ICD-10-CM | POA: Insufficient documentation

## 2019-10-24 DIAGNOSIS — R000 Tachycardia, unspecified: Secondary | ICD-10-CM | POA: Insufficient documentation

## 2019-10-24 DIAGNOSIS — Z7982 Long term (current) use of aspirin: Secondary | ICD-10-CM | POA: Diagnosis not present

## 2019-10-24 DIAGNOSIS — R42 Dizziness and giddiness: Secondary | ICD-10-CM | POA: Insufficient documentation

## 2019-10-24 DIAGNOSIS — E860 Dehydration: Secondary | ICD-10-CM | POA: Diagnosis not present

## 2019-10-24 DIAGNOSIS — J209 Acute bronchitis, unspecified: Secondary | ICD-10-CM | POA: Insufficient documentation

## 2019-10-24 MED ORDER — ACETAMINOPHEN 500 MG PO TABS
1000.0000 mg | ORAL_TABLET | Freq: Once | ORAL | Status: AC
  Administered 2019-10-24: 1000 mg via ORAL
  Filled 2019-10-24: qty 2

## 2019-10-24 MED ORDER — KETOROLAC TROMETHAMINE 30 MG/ML IJ SOLN
30.0000 mg | Freq: Once | INTRAMUSCULAR | Status: AC
  Administered 2019-10-24: 30 mg via INTRAMUSCULAR
  Filled 2019-10-24: qty 1

## 2019-10-24 NOTE — ED Triage Notes (Addendum)
C?O blurred vision and SOB x 5 days.  Also c/o intermittent body aches  AAOx3.  Skin warm and dry.NAd.  No SOB. DOE.  Ambulates with easy and steady gait.

## 2019-10-24 NOTE — ED Provider Notes (Signed)
[facility] Emergency Department Provider Note ____________________________________________   First MD Initiated Contact with Patient 10/24/19 1543     (approximate)  I have reviewed the triage vital signs and the nursing notes.  HISTORY  Chief Complaint Shortness of Breath and Blurred Vision   HPI Joel Carr is a 34 y.o. male present to the ED for evaluation of intermittent shortness of breath and blurry vision.  History of bipolar disorder and depression.  Patient presents to the ED with various complaints over an acute and subacute timeframe.   He reports, in a subacute timeframe, having a sinus infection and bronchitis over the past 1 month, finished a course of antibiotics 2 weeks ago with transient improvement of his symptoms. He reports for the past 5 days having intermittent lightheaded dizziness while standing, and sensation of shortness of breath.  Patient reports associated low-grade fevers of 100-101 F, treated with Tylenol and improving.  Patient was calling his PCP and being started on a burst of prednisone 3 days ago, which she has been compliant with, denies any change to his symptoms.  Patient denies any chest pain or pressure, denies syncope.  Patient denies IVDU or history of endocarditis. Regarding patient's intermittent blurry vision, he reports sensation of blurry vision when he has lightheaded dizziness, particularly while standing or walking.  Patient reports this resolves when he sits down in a matter of seconds.  Denies any concurrent headache or ocular pain with this.  Reports normal vision at this time.  Patient is not vaccinated for COVID-19.  He reports having a negative test 5 days ago when his symptoms started.  Patient reports wife and 2 children at home who are not ill.  Past Medical History:  Diagnosis Date  . Bipolar disorder (HCC)   . Depression     Patient Active Problem List   Diagnosis Date Noted  . Acute  non-recurrent maxillary sinusitis 10/04/2019  . Finger pain, left 07/17/2019  . Flank pain 05/08/2019  . Tension headache 05/08/2019  . Anxiety 10/23/2018  . GERD (gastroesophageal reflux disease) 07/07/2018  . Insomnia 07/07/2018  . Nodule of skin of abdomen 02/08/2018  . Impingement syndrome of shoulder region 10/10/2017  . RLS (restless legs syndrome) 02/02/2017  . Hyperlipidemia 04/10/2015  . Bipolar disorder (HCC) 01/01/2015    Past Surgical History:  Procedure Laterality Date  . KNEE ARTHROSCOPY WITH MENISCAL REPAIR Right 10/29/2014   Procedure: KNEE ARTHROSCOPY WITH MENISCAL REPAIR;  Surgeon: Kennedy BuckerMichael Menz, MD;  Location: [facility] ORS;  Service: Orthopedics;  Laterality: Right;  . KNEE SURGERY Right   . TESTICLE SURGERY N/A    as a child    Prior to Admission medications   Medication Sig Start Date End Date Taking? Authorizing Provider  gabapentin (NEURONTIN) 300 MG capsule Take 2 capsules (600 mg total) by mouth 2 (two) times daily as needed. 08/28/19  Yes Particia NearingLane, Rachel Elizabeth, PA-C  lamoTRIgine (LAMICTAL) 100 MG tablet Take 1 tablet (100 mg total) by mouth 2 (two) times daily. 03/29/19  Yes Particia NearingLane, Rachel Elizabeth, PA-C  lurasidone (LATUDA) 40 MG TABS tablet Take 1 tablet (40 mg total) by mouth daily with breakfast. 03/29/19  Yes Particia NearingLane, Rachel Elizabeth, PA-C  predniSONE (DELTASONE) 10 MG tablet Take 6 tabs day one, 5 tabs day two, 4 tabs day three, etc Patient taking differently: Take 10 mg by mouth taper from 4 doses each day to 1 dose and stop. Take 6 tabs day one, 5 tabs day two, 4 tabs day  three, etc 10/22/19  Yes Particia Nearing, PA-C  venlafaxine XR (EFFEXOR XR) 150 MG 24 hr capsule Take 1 capsule (150 mg total) by mouth daily with breakfast. 05/04/19  Yes Particia Nearing, PA-C  albuterol (VENTOLIN HFA) 108 (90 Base) MCG/ACT inhaler Inhale 2 puffs into the lungs every 6 (six) hours as needed for wheezing or shortness of breath. 11/30/18   Particia Nearing, PA-C    aspirin-acetaminophen-caffeine (EXCEDRIN MIGRAINE) [phone] MG tablet Take 1 tablet by mouth every 6 (six) hours as needed for headache. 05/08/19   Mardene Celeste I, NP  brompheniramine-pseudoephedrine-DM 30-2-10 MG/5ML syrup Take 10 mLs by mouth every 6 (six) hours. 10/19/19   [provider]  clonazePAM (KLONOPIN) 0.5 MG tablet Take 1 tablet (0.5 mg total) by mouth daily as needed for anxiety. 09/17/19   Particia Nearing, PA-C  cyclobenzaprine (FLEXERIL) 10 MG tablet Take 1 tablet (10 mg total) by mouth at bedtime as needed for muscle spasms. 10/15/19   Particia Nearing, PA-C  pantoprazole (PROTONIX) 20 MG tablet Take 1 tablet (20 mg total) by mouth daily. Patient not taking: Reported on 10/24/2019 10/20/18 10/24/19  Particia Nearing, PA-C  SUMAtriptan (IMITREX) 50 MG tablet Take 1 tab at onset of migriane.  Can take another tab in 2 hours if headache persists.  Max 2 tabs per day. 08/21/19   Particia Nearing, PA-C    Allergies Tramadol  Family History  Problem Relation Age of Onset  . Diabetes Paternal Grandfather   . Heart attack Paternal Grandfather   . Colon cancer Neg Hx     Social History Social History   Tobacco Use  . Smoking status: Current Every Day Smoker    Packs/day: 0.50    Years: 10.00    Pack years: 5.00    Types: Cigarettes  . Smokeless tobacco: Never Used  Vaping Use  . Vaping Use: Never used  Substance Use Topics  . Alcohol use: No  . Drug use: No    Review of Systems  Constitutional: Positive for fevers Eyes: No visual changes. ENT: No sore throat. Cardiovascular: Denies chest pain. Respiratory: Denies shortness of breath. Gastrointestinal: No abdominal pain.  No nausea, no vomiting.  No diarrhea.  No constipation. Genitourinary: Negative for dysuria. Musculoskeletal: Negative for back pain. Skin: Negative for rash. Neurological: Negative for headaches, focal weakness or  numbness.   ____________________________________________   PHYSICAL EXAM:  VITAL SIGNS: ED Triage Vitals  Enc Vitals Group     BP 10/24/19 1027 126/76     Pulse Rate 10/24/19 1027 (!) 112     Resp 10/24/19 1027 16     Temp 10/24/19 1027 99.2 F (37.3 C)     Temp Source 10/24/19 1027 Oral     SpO2 10/24/19 1027 97 %     Weight 10/24/19 1025 158 lb 15.2 oz (72.1 kg)     Height 10/24/19 1025 5\' 8"  (1.727 m)     Head Circumference --      Peak Flow --      Pain Score 10/24/19 1025 0     Pain Loc --      Pain Edu? --      Excl. in GC? --      Constitutional: Alert and oriented. Well appearing and in no acute distress.  Conversational in full sentences without distress. Eyes: Conjunctivae are normal. PERRL. EOMI. Head: Atraumatic. Nose: No congestion/rhinnorhea. Mouth/Throat: Mucous membranes are dry.  Oropharynx non-erythematous. Neck: No stridor. No cervical spine  tenderness to palpation. Cardiovascular: Normal rate, regular rhythm. Grossly normal heart sounds.  Good peripheral circulation. Respiratory: Normal respiratory effort.  No retractions. Lungs CTAB. Gastrointestinal: Soft , nondistended, nontender to palpation. No abdominal bruits. No CVA tenderness. Musculoskeletal: No lower extremity tenderness nor edema.  No joint effusions. No signs of acute trauma. Neurologic:  Normal speech and language. No gross focal neurologic deficits are appreciated. No gait instability noted. Cranial nerves II through XII intact Full-strength and sensation in all 4 extremities. Ambulatory with normal gait. Skin:  Skin is warm, dry and intact. No rash noted. Psychiatric: Mood and affect are normal. Speech and behavior are normal.  ____________________________________________   LABS (all labs ordered are listed, but only abnormal results are displayed)  Labs Reviewed - No data to display ____________________________________________  12 Lead EKG Sinus rhythm, rate of 106 bpm,  normal axis and intervals.  No evidence of acute ischemia.  ____________________________________________  RADIOLOGY  ED MD interpretation: 2 view CXR due to complaints of shortness of breath, results reviewed remarkable for no evidence of acute cardiopulmonary pathology.  Official radiology report(s): DG Chest 2 View  Result Date: 10/24/2019 CLINICAL DATA:  Shortness of breath EXAM: CHEST - 2 VIEW COMPARISON:  March 12, 2014 FINDINGS: Lungs are clear. Heart size and pulmonary vascularity are normal. No adenopathy. No pneumothorax. No bone lesions. IMPRESSION: Lungs clear.  Cardiac silhouette normal. Electronically Signed   By: Bretta Bang III M.D.   On: 10/24/2019 10:48    ____________________________________________   PROCEDURES  Procedure(s) performed (including Critical Care):  Procedures   ____________________________________________   INITIAL IMPRESSION / ASSESSMENT AND PLAN / ED COURSE  Healthy 34 year old male presenting with evidence of acute viral syndrome amenable to continued outpatient management with PCP follow-up.  Patient presented tachycardic in triage, that resolved with p.o. fluids, and vitals otherwise normal in room air.  Exam with a well-appearing young patient without evidence of acute pathology, beyond dry mucous membranes as a sign of mild dehydration.  Patient with no risk factors, such as IVDU, pericarditis.  He has no chest pain, syncope, neck stiffness or signs of more serious illness.  His lingering symptoms are most consistent with a viral syndrome.  Patient reports improving symptoms after p.o. fluid rehydration, Tylenol and Toradol administration.  He has good PCP follow-up, and I advised him to follow-up within the next 3-5 days to assess his continued symptoms.  Return precautions for the ED were discussed.  Clinical Course as of Oct 24 1731  Wed Oct 24, 2019  1550 Shared decision making at the bedside with the patient regarding utility of  blood work versus more conservative measures and earlier discharge.  Patient has been waiting for 6 hours to be seen due to remarkable boarding in the ED. patient looks well to me and educate the patient that blood work is unlikely to yield changes in the care plan, though I am happy to offer labs in search of signs of more serious pathology.  Educated patient that he is likely experiencing a viral syndrome, and I see no signs of pathology to warrant antibiotic usage.  Expresses understanding and agreement, and prefers to forego blood work to allow for more rapid to discharge.  We agreed on a plan to provide symptomatic treatment with Tylenol/Toradol, as well as p.o. challenge with reassessment of clinical status and vital signs.   [DS]  1602 Provided patient 2 cups ice water.   [DS]  1732 Reassessed.  Patient reports feeling improved.  Heart rate  remains in the 70s.  Symptoms resolved.  Eager to go home.  We discussed return precautions for the ED.   [DS]    Clinical Course User Index [DS] Delton Prairie, MD     ____________________________________________   FINAL CLINICAL IMPRESSION(S) / ED DIAGNOSES  Final diagnoses:  Shortness of breath  Viral syndrome  Dehydration     ED Discharge Orders    None       Carley Strickling Katrinka Blazing   Note:  This document was prepared using Dragon voice recognition software and may include unintentional dictation errors.   Delton Prairie, MD 10/24/19 [phone]

## 2019-10-24 NOTE — Discharge Instructions (Signed)
As we discussed, you are likely fighting off a lingering viral syndrome.  At this point, I do not think you would benefit from antibiotics.  Please continue to use Tylenol and ibuprofen/Advil for any low-grade fevers that you may have. Push fluids and eat food if you can.  Please follow-up with your primary care physician within the next 5-7 days, as we discussed, to discuss your continued symptoms.  If you develop any worsening symptoms, please return to the ED.

## 2019-10-24 NOTE — Telephone Encounter (Signed)
Patient is calling to reports he is having blurred vision, SOB, body ache.  Patient is presently using Prednisone- he reports he is not feeling ant better.  Patient reports the body aches and blurred vision are new.Patient states he has SOB when he is doing activity and when he is sitting and talking. Advised ED for evaluation of symptoms.  Reason for Disposition  [1] MODERATE difficulty breathing (e.g., speaks in phrases, SOB even at rest, pulse 100-120) AND [2] NEW-onset or WORSE than normal  Answer Assessment - Initial Assessment Questions 1. DESCRIPTION: "What is the vision loss like? Describe it for me." (e.g., complete vision loss, blurred vision, double vision, floaters, etc.)     Blurred- everything looks glossy or lines in visionfield 2. LOCATION: "One or both eyes?" If one, ask: "Which eye?"     Both eyes 3. SEVERITY: "Can you see anything?" If Yes, ask: "What can you see?" (e.g., fine print)     Comes and goes- 4 episodes in 1 hour 4. ONSET: "When did this begin?" "Did it start suddenly or has this been gradual?"     This morning- gradual chnages 5. PATTERN: "Does this come and go, or has it been constant since it started?"     Comes and goes 6. PAIN: "Is there any pain in your eye(s)?"  (Scale 1-10; or mild, moderate, severe)     No- headcahe 7. CONTACTS-GLASSES: "Do you wear contacts or glasses?"     no 8. CAUSE: "What do you think is causing this visual problem?"     Not sure 9. OTHER SYMPTOMS: "Do you have any other symptoms?" (e.g., confusion, headache, arm or leg weakness, speech problems)     Headache, muscle aches, SOB 10. PREGNANCY: "Is there any chance you are pregnant?" "When was your last menstrual period?"       n/a  Answer Assessment - Initial Assessment Questions 1. RESPIRATORY STATUS: "Describe your breathing?" (e.g., wheezing, shortness of breath, unable to speak, severe coughing)      Out of breath with activity  2. ONSET: "When did this breathing problem  begin?"      Changed- last night 3. PATTERN "Does the difficult breathing come and go, or has it been constant since it started?"      constant 4. SEVERITY: "How bad is your breathing?" (e.g., mild, moderate, severe)    - MILD: No SOB at rest, mild SOB with walking, speaks normally in sentences, can lay down, no retractions, pulse < 100.    - MODERATE: SOB at rest, SOB with minimal exertion and prefers to sit, cannot lie down flat, speaks in phrases, mild retractions, audible wheezing, pulse 100-120.    - SEVERE: Very SOB at rest, speaks in single words, struggling to breathe, sitting hunched forward, retractions, pulse > 120      moderate 5. RECURRENT SYMPTOM: "Have you had difficulty breathing before?" If Yes, ask: "When was the last time?" and "What happened that time?"      2019- upper lung infection 6. CARDIAC HISTORY: "Do you have any history of heart disease?" (e.g., heart attack, angina, bypass surgery, angioplasty)      no 7. LUNG HISTORY: "Do you have any history of lung disease?"  (e.g., pulmonary embolus, asthma, emphysema)     no 8. CAUSE: "What do you think is causing the breathing problem?"      Possible COVID/infection 9. OTHER SYMPTOMS: "Do you have any other symptoms? (e.g., dizziness, runny nose, cough, chest pain, fever)  Cough,off balance with blurred vision, not hungry 10. PREGNANCY: "Is there any chance you are pregnant?" "When was your last menstrual period?"       n/a 11. TRAVEL: "Have you traveled out of the country in the last month?" (e.g., travel history, exposures)       no  Protocols used: BREATHING DIFFICULTY-A-AH, VISION LOSS OR CHANGE-A-AH

## 2019-10-24 NOTE — Telephone Encounter (Signed)
Agree with triage disposition.

## 2019-10-26 ENCOUNTER — Encounter: Payer: Self-pay | Admitting: Family Medicine

## 2019-10-26 ENCOUNTER — Other Ambulatory Visit: Payer: Self-pay | Admitting: Family Medicine

## 2019-10-26 DIAGNOSIS — R109 Unspecified abdominal pain: Secondary | ICD-10-CM

## 2019-10-26 DIAGNOSIS — G44209 Tension-type headache, unspecified, not intractable: Secondary | ICD-10-CM

## 2019-10-26 NOTE — Telephone Encounter (Signed)
Routing to provider  

## 2019-11-08 ENCOUNTER — Other Ambulatory Visit: Payer: Self-pay | Admitting: Family Medicine

## 2019-11-08 NOTE — Telephone Encounter (Signed)
Requested medication (s) are due for refill today - yes  Requested medication (s) are on the active medication list -yes  Future visit scheduled -yes  Last refill: 03/29/19 #90 1 RF  (request states last filled 10/06/19- unable to see that RF in medication list)  Notes to clinic: request for non delegated Rx  Requested Prescriptions  Pending Prescriptions Disp Refills   LATUDA 40 MG TABS tablet [Pharmacy Med Name: LATUDA 40 MG TAB] 90 tablet 1    Sig: TAKE 1 TABLET BY MOUTH DAILY WITH BREAKFAST      Not Delegated - Psychiatry:  Antipsychotics - Second Generation (Atypical) - lurasidone Failed - 11/08/2019 11:05 AM      Failed - This refill cannot be delegated      Passed - Last BP in normal range    BP Readings from Last 1 Encounters:  10/24/19 114/81          Passed - Last Heart Rate in normal range    Pulse Readings from Last 1 Encounters:  10/24/19 80          Passed - Valid encounter within last 6 months    Recent Outpatient Visits           2 weeks ago Viral URI with cough   [facility] Particia Nearing, [place]   1 month ago Acute non-recurrent maxillary sinusitis   [facility] Mardene Celeste I, NP   3 months ago Pure hypercholesterolemia   [facility] Roosvelt Maser [place], [place]   3 months ago Anxiety   [facility] Roosvelt Maser [place], [place]   3 months ago Finger pain, left   [facility] Mardene Celeste I, NP       Future Appointments             In 3 months Asaro, Jodelle Gross, NP [facility]                Requested Prescriptions  Pending Prescriptions Disp Refills   LATUDA 40 MG TABS tablet [Pharmacy Med Name: LATUDA 40 MG TAB] 90 tablet 1    Sig: TAKE 1 TABLET BY MOUTH DAILY WITH BREAKFAST      Not Delegated - Psychiatry:  Antipsychotics - Second Generation (Atypical) - lurasidone Failed - 11/08/2019 11:05 AM      Failed - This refill cannot be delegated       Passed - Last BP in normal range    BP Readings from Last 1 Encounters:  10/24/19 114/81          Passed - Last Heart Rate in normal range    Pulse Readings from Last 1 Encounters:  10/24/19 80          Passed - Valid encounter within last 6 months    Recent Outpatient Visits           2 weeks ago Viral URI with cough   [facility] Particia Nearing, [place]   1 month ago Acute non-recurrent maxillary sinusitis   [facility] Mardene Celeste I, NP   3 months ago Pure hypercholesterolemia   [facility] Roosvelt Maser [place], [place]   3 months ago Anxiety   [facility] Roosvelt Maser [place], [place]   3 months ago Finger pain, left   [facility] Mardene Celeste I, NP       Future Appointments  In 3 months Denzil Magnuson, Jodelle Gross, NP [facility], [facility]

## 2019-11-09 ENCOUNTER — Other Ambulatory Visit: Payer: Self-pay | Admitting: Family Medicine

## 2019-11-09 DIAGNOSIS — R109 Unspecified abdominal pain: Secondary | ICD-10-CM

## 2019-11-09 DIAGNOSIS — G44209 Tension-type headache, unspecified, not intractable: Secondary | ICD-10-CM

## 2019-11-09 NOTE — Telephone Encounter (Signed)
Routing to provider  

## 2019-11-10 ENCOUNTER — Other Ambulatory Visit: Payer: Self-pay | Admitting: Family Medicine

## 2019-11-10 NOTE — Telephone Encounter (Signed)
Requested medication (s) are due for refill today: yes  Requested medication (s) are on the active medication list: yes  Last refill:  03/29/19  Future visit scheduled: yes  Notes to clinic:  med not delegated to NT to RF   Requested Prescriptions  Pending Prescriptions Disp Refills   lamoTRIgine (LAMICTAL) 100 MG tablet [Pharmacy Med Name: LAMOTRIGINE 100 MG TAB] 180 tablet 1    Sig: TAKE 1 TABLET BY MOUTH TWICE A DAY      Not Delegated - Neurology:  Anticonvulsants Failed - 11/10/2019 11:09 AM      Failed - This refill cannot be delegated      Failed - WBC in normal range and within 360 days    WBC  Date Value Ref Range Status  08/08/2019 13.1 (H) 3.4 - 10.8 x10E3/uL Final  10/19/2016 6.3 3.8 - 10.6 K/uL Final          Passed - HCT in normal range and within 360 days    Hematocrit  Date Value Ref Range Status  08/08/2019 46.0 37.5 - 51.0 % Final          Passed - HGB in normal range and within 360 days    Hemoglobin  Date Value Ref Range Status  08/08/2019 16.1 13.0 - 17.7 g/dL Final          Passed - PLT in normal range and within 360 days    Platelets  Date Value Ref Range Status  08/08/2019 219 150 - 450 x10E3/uL Final          Passed - Valid encounter within last 12 months    Recent Outpatient Visits           2 weeks ago Viral URI with cough   [facility] Particia Nearing, PA-C   1 month ago Acute non-recurrent maxillary sinusitis   [facility] Mardene Celeste I, NP   3 months ago Pure hypercholesterolemia   [facility] Roosvelt Maser [place], [place]   3 months ago Anxiety   [facility] Roosvelt Maser [place], [place]   3 months ago Finger pain, left   [facility] Mardene Celeste I, NP       Future Appointments             In 3 months Denzil Magnuson, Jodelle Gross, NP [facility] [facility], [facility]

## 2019-11-11 MED ORDER — CYCLOBENZAPRINE HCL 10 MG PO TABS: 10.0000 mg | ORAL_TABLET | Freq: Every evening | ORAL | 0 refills | Status: DC | PRN

## 2019-11-12 ENCOUNTER — Encounter: Payer: Self-pay | Admitting: Family Medicine

## 2019-11-12 NOTE — Telephone Encounter (Signed)
LOV: 08/08/19 NOV: 02/08/20  Last filled 03/29/19 for 90 days with 1 refill.

## 2019-11-13 ENCOUNTER — Encounter: Payer: Self-pay | Admitting: Family Medicine

## 2019-11-14 ENCOUNTER — Other Ambulatory Visit: Payer: Self-pay | Admitting: Family Medicine

## 2019-11-14 MED ORDER — CLONAZEPAM 0.5 MG PO TABS: 0.5000 mg | ORAL_TABLET | Freq: Every day | ORAL | 0 refills | Status: DC | PRN

## 2019-12-05 ENCOUNTER — Other Ambulatory Visit: Payer: Self-pay

## 2019-12-07 ENCOUNTER — Ambulatory Visit: Payer: 59 | Admitting: Nurse Practitioner

## 2019-12-07 DIAGNOSIS — Z0289 Encounter for other administrative examinations: Secondary | ICD-10-CM

## 2019-12-23 DIAGNOSIS — U071 COVID-19: Secondary | ICD-10-CM

## 2019-12-23 HISTORY — DX: COVID-19: U07.1

## 2020-01-04 ENCOUNTER — Encounter: Payer: Self-pay | Admitting: Nurse Practitioner

## 2020-01-04 NOTE — Telephone Encounter (Signed)
Please Advise. Last visit 10/22/19.  KP

## 2020-01-04 NOTE — Telephone Encounter (Signed)
Needs appt

## 2020-01-09 ENCOUNTER — Other Ambulatory Visit: Payer: Self-pay

## 2020-01-11 ENCOUNTER — Other Ambulatory Visit: Payer: Self-pay

## 2020-01-11 ENCOUNTER — Telehealth (INDEPENDENT_AMBULATORY_CARE_PROVIDER_SITE_OTHER): Payer: 59 | Admitting: Nurse Practitioner

## 2020-01-11 ENCOUNTER — Encounter: Payer: Self-pay | Admitting: Nurse Practitioner

## 2020-01-11 VITALS — Ht 68.0 in | Wt 158.0 lb

## 2020-01-11 DIAGNOSIS — F3131 Bipolar disorder, current episode depressed, mild: Secondary | ICD-10-CM | POA: Diagnosis not present

## 2020-01-11 DIAGNOSIS — G2581 Restless legs syndrome: Secondary | ICD-10-CM

## 2020-01-11 DIAGNOSIS — F419 Anxiety disorder, unspecified: Secondary | ICD-10-CM

## 2020-01-11 DIAGNOSIS — E782 Mixed hyperlipidemia: Secondary | ICD-10-CM

## 2020-01-11 NOTE — Progress Notes (Signed)
Virtual Visit via Video Note  This visit type was conducted due to national recommendations for restrictions regarding the COVID-19 pandemic (e.g. social distancing).  This format is felt to be most appropriate for this patient at this time.  All issues noted in this document were discussed and addressed.  No physical exam was performed (except for noted visual exam findings with Video Visits).   I connected with@ on 01/13/20 at  2:00 PM EDT by a video enabled telemedicine application or telephone and verified that I am speaking with the correct person using two identifiers. Location patient: home and in his car Location provider: work or home office Persons participating in the virtual visit: patient, provider  I discussed the limitations, risks, security and privacy concerns of performing an evaluation and management service by telephone and the availability of in person appointments. I also discussed with the patient that there may be a patient responsible charge related to this service. The patient expressed understanding and agreed to proceed.  Reason for visit: New patient to establish care and needs refills on his psychiatric medications.  HPI: This 34 year old patient with history of bipolar depression, anxiety, GERD, hyperlipidemia, restless leg syndrome, would like to establish care with a new primary care provider. He needs refills of his psychiatric medication.  He reports he was followed by Dr. Maryruth Bun greater than 1 year ago and she would no take his insurance. He also saw Dr. Vonita Moss for years and he has been prescribing his psychiatric medication until his  insurance changed on August 31. He has tried to find a Production manager, none were taking patients.  He does not want to drive to [place].  He has not contacted [facility].   Bipolar depression/anxiety: Reports he has been on stable medication.  He was initially diagnosed with bipolar during the [facility] behavioral health  hospitalization 10 years ago.  He presents taking Lamictal 200 mg twice daily, Latuda 40 mg once daily, Effexor XR 150 mg once daily, Klonopin 0.5 mg as needed daily.  He reports he is doing well.  His PHQ-9: 4, no SI or HI.  Gad-7: 3 today.  RLS: He takes gabapentin 300 mg twice daily as needed, Flexeril 10 mg at bedtime as needed  ROS: See pertinent positives and negatives per HPI.  FH: Paternal grandfather with diabetes on dialysis.  Father with depression   Past Medical History:  Diagnosis Date  . Bipolar disorder (HCC)   . COVID-19 virus infection 12/23/2019   mild congestion and body aches  . Depression   . Hyperlipidemia   . RLS (restless legs syndrome)     Past Surgical History:  Procedure Laterality Date  . KNEE ARTHROSCOPY WITH MENISCAL REPAIR Right 10/29/2014   Procedure: KNEE ARTHROSCOPY WITH MENISCAL REPAIR;  Surgeon: Kennedy Bucker, MD;  Location: [facility] ORS;  Service: Orthopedics;  Laterality: Right;  . KNEE SURGERY Right   . TESTICLE SURGERY N/A    as a child    Family History  Problem Relation Age of Onset  . Depression Father   . Diabetes Paternal Grandfather   . Heart attack Paternal Grandfather   . Colon cancer Neg Hx     SOCIAL HX:  ETOH: None  Tobacco 1/2 pack/day cigarettes  Drug use: None   Current Outpatient Medications:  .  albuterol (VENTOLIN HFA) 108 (90 Base) MCG/ACT inhaler, Inhale 2 puffs into the lungs every 6 (six) hours as needed for wheezing or shortness of breath., Disp: 18 g, Rfl: 0 .  aspirin-acetaminophen-caffeine (EXCEDRIN MIGRAINE) 250-250-65 MG tablet, Take 1 tablet by mouth every 6 (six) hours as needed for headache., Disp: 30 tablet, Rfl: 0 .  clonazePAM (KLONOPIN) 0.5 MG tablet, Take 1 tablet (0.5 mg total) by mouth daily as needed for anxiety., Disp: 30 tablet, Rfl: 0 .  cyclobenzaprine (FLEXERIL) 10 MG tablet, Take 1 tablet (10 mg total) by mouth at bedtime as needed for muscle spasms., Disp: 30 tablet, Rfl: 0 .  gabapentin  (NEURONTIN) 300 MG capsule, Take 2 capsules (600 mg total) by mouth 2 (two) times daily as needed., Disp: 180 capsule, Rfl: 1 .  lamoTRIgine (LAMICTAL) 100 MG tablet, TAKE 1 TABLET BY MOUTH TWICE A DAY, Disp: 180 tablet, Rfl: 1 .  LATUDA 40 MG TABS tablet, TAKE 1 TABLET BY MOUTH DAILY WITH BREAKFAST, Disp: 90 tablet, Rfl: 1 .  venlafaxine XR (EFFEXOR XR) 150 MG 24 hr capsule, Take 1 capsule (150 mg total) by mouth daily with breakfast., Disp: 90 capsule, Rfl: 1  EXAM:  VITALS per patient if applicable:Wt 158 lbs  GENERAL: alert, oriented, appears well and in no acute distress  HEENT: atraumatic, conjunctiva clear, no obvious abnormalities on inspection of external nose and ears  NECK: normal movements of the head and neck. Extensive anterior neck tattoo.   LUNGS: on inspection no signs of respiratory distress, breathing rate appears normal, no obvious gross SOB, gasping or wheezing  CV: no obvious cyanosis  MS: moves all visible extremities without noticeable abnormality  PSYCH/NEURO: pleasant and cooperative, no obvious depression or anxiety, speech and thought processing grossly intact  ASSESSMENT AND PLAN:  Discussed the following assessment and plan:  Bipolar affective disorder, currently depressed, mild (HCC)  Anxiety  RLS (restless legs syndrome)  No problem-specific Assessment & Plan notes found for this encounter.  Please come in on Monday after work to sign controlled substance contract. We discussed that I would like patient to establish with psychiatry to manage his psychiatric medications but I will refill those for him while he is waiting to get in.  He reports he has been very stable on these medications.   Please make an appointment with  psychiatry and here are 3 places to call.  If there are no openings, we may need a [facility] referral.  [facility] :  [phone]  [facility]:  [phone]  [facility]: [phone]  Please make office visit with me in 2  months for a complete physical exam and lab work.   I discussed the assessment and treatment plan with the patient. The patient was provided an opportunity to ask questions and all were answered. The patient agreed with the plan and demonstrated an understanding of the instructions.   The patient was advised to call back or seek an in-person evaluation if the symptoms worsen or if the condition fails to improve as anticipated.  Amedeo Kinsman, NP Adult Nurse Practitioner [facility] [employer] [phone]

## 2020-01-11 NOTE — Patient Instructions (Addendum)
Please come in on Monday after work to sign controlled substance contract.  Please make an appointment with  psychiatry and here are 3 places to call.  If there are no openings, we may need a [facility] referral.   [facility] :  [phone]  [facility]:  [phone]  [facility]: [phone]  Please make office visit with me in 2 months for a complete physical exam and lab work.

## 2020-01-14 ENCOUNTER — Telehealth: Payer: Self-pay

## 2020-01-14 ENCOUNTER — Other Ambulatory Visit: Payer: Self-pay | Admitting: Nurse Practitioner

## 2020-01-14 MED ORDER — LAMOTRIGINE 100 MG PO TABS: 100.0000 mg | ORAL_TABLET | Freq: Two times a day (BID) | ORAL | 0 refills | Status: DC

## 2020-01-14 MED ORDER — VENLAFAXINE HCL ER 150 MG PO CP24: 150.0000 mg | ORAL_CAPSULE | Freq: Every day | ORAL | 0 refills | Status: DC

## 2020-01-14 MED ORDER — CLONAZEPAM 0.5 MG PO TABS: 0.5000 mg | ORAL_TABLET | Freq: Every day | ORAL | 0 refills | Status: DC | PRN

## 2020-01-14 NOTE — Telephone Encounter (Signed)
Patient signed his medication contract this afternoon. Please send in clonazepam 0.5 mg tabs to [facility].

## 2020-01-14 NOTE — Progress Notes (Unsigned)
Joel Carr came in and signed the controlled substance contract for clonazepam 0.5 mg x 1 daily as needed.  I checked the [place] drug database- PDMP and there is no suspicious activity.  I refilled his clonazepam 0.5 mg x 1 daily as needed.  I also refilled his Lamictal and Effexor as he is awaiting psychiatry  transfer of care. I will need to d/w Psychiatry for Latuda RX refill.

## 2020-01-16 ENCOUNTER — Telehealth: Payer: Self-pay

## 2020-01-16 ENCOUNTER — Encounter: Payer: Self-pay | Admitting: Nurse Practitioner

## 2020-01-16 DIAGNOSIS — R0609 Other forms of dyspnea: Secondary | ICD-10-CM

## 2020-01-16 NOTE — Telephone Encounter (Signed)
Patient sent in a mychart message stating that he has been having a hard time breathing at work even with my inhaler. My safety lady wanted me to follow up with a doctor as soon as I could. She is worried that my covid infection has affected my lungs. Can you triage this patient?

## 2020-01-16 NOTE — Telephone Encounter (Signed)
Patient states he has been having trouble with SOB and wheezing when at work. Patient does have to wear a mask and does some heavy lifting and pushing. Patient had COVID 12/23/19; only had chest congestion and body aches when he was sick. Patient has a rescue inhaler (ventolin) that he can use but states it's not really working when he uses it. Patient is not having any SOB or wheezing when hes taking it easy only when he's doing a lot of activity at work. They have him taking it easy today since he has been having these issues for the past couple of days. Patient never had a chest xray or anything when he had COVID since his breathing was not affected then. Please advise.

## 2020-01-16 NOTE — Telephone Encounter (Signed)
Spoke with patient and advised patient to have chest x-ray done at [facility]; orders already in. Patient agrees to do. Patient is scheduled to come in the office per Dr. Darrick Huntsman with Fransico Setters, NP on 01/22/20.

## 2020-01-16 NOTE — Telephone Encounter (Signed)
I can not triage patient due to conflict of interest.

## 2020-01-16 NOTE — Telephone Encounter (Signed)
Tried to call patient but no answer and VM has not been set up yet

## 2020-01-16 NOTE — Telephone Encounter (Signed)
Patient is being called to triaged

## 2020-01-16 NOTE — Telephone Encounter (Signed)
Tried to call patient but no answer and VM has not been set up yet °

## 2020-01-16 NOTE — Telephone Encounter (Signed)
HE NEEDS TO BE scheduled for an appointment .  I have ordered a chest x ray that he can get done at [facility].  Needs a face to face visit for ausculation of lungs etc.

## 2020-01-16 NOTE — Addendum Note (Signed)
Addended by: Sherlene Shams on: 01/16/2020 12:15 PM   Modules accepted: Orders

## 2020-01-16 NOTE — Telephone Encounter (Signed)
He has [facility] and Dr. Maryruth Bun does not accept that insurance.  Dr. Elna Breslow is not taking new patients and Sabastian wants to stay local. He was advised to make an appointment with the psychiatry at one of these 3 offices.     [facility] :  [phone]  [facility]:  [phone]  [facility]: [phone]  Shanda Bumps: Please call and inquire if he has done this, yet. If not, please ask him to do ASAP as he is on a complex psychiatric regimen and they are booking out. Thanks!

## 2020-01-17 ENCOUNTER — Other Ambulatory Visit: Payer: Self-pay | Admitting: Nurse Practitioner

## 2020-01-17 MED ORDER — LURASIDONE HCL 40 MG PO TABS: 40.0000 mg | ORAL_TABLET | Freq: Every day | ORAL | 1 refills | Status: DC

## 2020-01-17 NOTE — Telephone Encounter (Signed)
Ok. I refilled his Kasandra Knudsen as well as the others. [facility] can mange in the future.

## 2020-01-17 NOTE — Telephone Encounter (Signed)
He told me yesterday when he called that he will be seeing [facility] next Wednesday.

## 2020-01-18 ENCOUNTER — Ambulatory Visit
Admission: RE | Admit: 2020-01-18 | Discharge: 2020-01-18 | Disposition: A | Discharge status: Home / Self Care | Payer: 59 | Source: Ambulatory Visit | Attending: Internal Medicine | Admitting: Internal Medicine

## 2020-01-18 ENCOUNTER — Ambulatory Visit
Admission: RE | Admit: 2020-01-18 | Discharge: 2020-01-18 | Disposition: A | Discharge status: Home / Self Care | Payer: 59 | Attending: Internal Medicine | Admitting: Internal Medicine

## 2020-01-18 DIAGNOSIS — R0609 Other forms of dyspnea: Secondary | ICD-10-CM

## 2020-01-18 DIAGNOSIS — R0600 Dyspnea, unspecified: Secondary | ICD-10-CM | POA: Diagnosis not present

## 2020-01-19 NOTE — Progress Notes (Signed)
Your chest x ray has the typical appearance of someone who is recovering from  COVID 19 and explains the shortness of breath you have been experiencing with exertion.  Please keep the appt.  With Fransico Setters on the 26th so she can listen to your lungs and decide if you need additional medications or restrictions to work duties.

## 2020-01-22 ENCOUNTER — Other Ambulatory Visit: Payer: Self-pay

## 2020-01-22 ENCOUNTER — Ambulatory Visit (INDEPENDENT_AMBULATORY_CARE_PROVIDER_SITE_OTHER): Payer: 59 | Admitting: Nurse Practitioner

## 2020-01-22 ENCOUNTER — Encounter: Payer: Self-pay | Admitting: Nurse Practitioner

## 2020-01-22 VITALS — BP 122/84 | HR 82 | Temp 98.3°F | Ht 68.0 in | Wt 173.0 lb

## 2020-01-22 DIAGNOSIS — F419 Anxiety disorder, unspecified: Secondary | ICD-10-CM

## 2020-01-22 DIAGNOSIS — F3131 Bipolar disorder, current episode depressed, mild: Secondary | ICD-10-CM

## 2020-01-22 DIAGNOSIS — Z8616 Personal history of COVID-19: Secondary | ICD-10-CM | POA: Diagnosis not present

## 2020-01-22 DIAGNOSIS — R0609 Other forms of dyspnea: Secondary | ICD-10-CM

## 2020-01-22 DIAGNOSIS — R9389 Abnormal findings on diagnostic imaging of other specified body structures: Secondary | ICD-10-CM

## 2020-01-22 DIAGNOSIS — E782 Mixed hyperlipidemia: Secondary | ICD-10-CM

## 2020-01-22 DIAGNOSIS — J45909 Unspecified asthma, uncomplicated: Secondary | ICD-10-CM | POA: Diagnosis not present

## 2020-01-22 DIAGNOSIS — R0600 Dyspnea, unspecified: Secondary | ICD-10-CM | POA: Diagnosis not present

## 2020-01-22 DIAGNOSIS — Z0000 Encounter for general adult medical examination without abnormal findings: Secondary | ICD-10-CM

## 2020-01-22 DIAGNOSIS — Z8709 Personal history of other diseases of the respiratory system: Secondary | ICD-10-CM

## 2020-01-22 DIAGNOSIS — U071 COVID-19: Secondary | ICD-10-CM

## 2020-01-22 MED ORDER — FLOVENT HFA 110 MCG/ACT IN AERO
2.0000 | INHALATION_SPRAY | Freq: Two times a day (BID) | RESPIRATORY_TRACT | 4 refills | Status: DC
Start: 2020-01-22 — End: 2020-07-22

## 2020-01-22 NOTE — Patient Instructions (Addendum)
Please go to the lab today for routine laboratory studies.  I have put a referral into pulmonology for your shortness of breath following a Covid infection, history of asthma, abnormal chest x-ray.  I have ordered Flovent which is a steroid inhaler to help with swelling, inflammation in the lungs and tightness. Use along with your albuterol.  Use the inhaler as directed twice daily.  Rinse the mouth, brush the tongue gently.  Monitor for any white thrush on the tongue and call us if this occurs.  Smoking cessation information - call [place] quit line, [phone] as they have 24 hour live phone service for advice and help to quit smoking.  Consider chewing nicotine gum or lozenges.  You would use the lowest dose possible 2 mg dose.   Weeks 1-6: use 1 piece every 1-2 hours.  Do this in place of smoking.  Weeks 7-8 use 1 piece every 2-4 hours  Weeks 10-12 1 piece every 4-8 hours Duration of therapy is 12 weeks. Follow-up office visit in 2 months Asthma, Adult  Asthma is a long-term (chronic) condition that causes recurrent episodes in which the airways become tight and narrow. The airways are the passages that lead from the nose and mouth down into the lungs. Asthma episodes, also called asthma attacks, can cause coughing, wheezing, shortness of breath, and chest pain. The airways can also fill with mucus. During an attack, it can be difficult to breathe. Asthma attacks can range from minor to life threatening. Asthma cannot be cured, but medicines and lifestyle changes can help control it and treat acute attacks. What are the causes? This condition is believed to be caused by inherited (genetic) and environmental factors, but its exact cause is not known. There are many things that can bring on an asthma attack or make asthma symptoms worse (triggers). Asthma triggers are different for each person. Common triggers include:  Mold.  Dust.  Cigarette smoke.  Cockroaches.  Things that can  cause allergy symptoms (allergens), such as animal dander or pollen from trees or grass.  Air pollutants such as household cleaners, wood smoke, smog, or Therapist, occupational.  Cold air, weather changes, and winds (which increase molds and pollen in the air).  Strong emotional expressions such as crying or laughing hard.  Stress.  Certain medicines (such as aspirin) or types of medicines (such as beta-blockers).  Sulfites in foods and drinks. Foods and drinks that may contain sulfites include dried fruit, potato chips, and sparkling grape juice.  Infections or inflammatory conditions such as the flu, a cold, or inflammation of the nasal membranes (rhinitis).  Gastroesophageal reflux disease (GERD).  Exercise or strenuous activity. What are the signs or symptoms? Symptoms of this condition may occur right after asthma is triggered or many hours later. Symptoms include:  Wheezing. This can sound like whistling when you breathe.  Excessive nighttime or early morning coughing.  Frequent or severe coughing with a common cold.  Chest tightness.  Shortness of breath.  Tiredness (fatigue) with minimal activity. How is this diagnosed? This condition is diagnosed based on:  Your medical history.  A physical exam.  Tests, which may include: ? Lung function studies and pulmonary studies (spirometry). These tests can evaluate the flow of air in your lungs. ? Allergy tests. ? Imaging tests, such as X-rays. How is this treated? There is no cure for this condition, but treatment can help control your symptoms. Treatment for asthma usually involves:  Identifying and avoiding your asthma triggers.  Using medicines to control your symptoms. Generally, two types of medicines are used to treat asthma: ? Controller medicines. These help prevent asthma symptoms from occurring. They are usually taken every day. ? Fast-acting reliever or rescue medicines. These quickly relieve asthma symptoms by  widening the narrow and tight airways. They are used as needed and provide short-term relief.  Using supplemental oxygen. This may be needed during a severe episode.  Using other medicines, such as: ? Allergy medicines, such as antihistamines, if your asthma attacks are triggered by allergens. ? Immune medicines (immunomodulators). These are medicines that help control the immune system.  Creating an asthma action plan. An asthma action plan is a written plan for managing and treating your asthma attacks. This plan includes: ? A list of your asthma triggers and how to avoid them. ? Information about when medicines should be taken and when their dosage should be changed. ? Instructions about using a device called a peak flow meter. A peak flow meter measures how well the lungs are working and the severity of your asthma. It helps you monitor your condition. Follow these instructions at home: Controlling your home environment Control your home environment in the following ways to help avoid triggers and prevent asthma attacks:  Change your heating and air conditioning filter regularly.  Limit your use of fireplaces and wood stoves.  Get rid of pests (such as roaches and mice) and their droppings.  Throw away plants if you see mold on them.  Clean floors and dust surfaces regularly. Use unscented cleaning products.  Try to have someone else vacuum for you regularly. Stay out of rooms while they are being vacuumed and for a short while afterward. If you vacuum, use a dust mask from a hardware store, a double-layered or microfilter vacuum cleaner bag, or a vacuum cleaner with a HEPA filter.  Replace carpet with wood, tile, or vinyl flooring. Carpet can trap dander and dust.  Use allergy-proof pillows, mattress covers, and box spring covers.  Keep your bedroom a trigger-free room.  Avoid pets and keep windows closed when allergens are in the air.  Wash beddings every week in hot water  and dry them in a dryer.  Use blankets that are made of polyester or cotton.  Clean bathrooms and kitchens with bleach. If possible, have someone repaint the walls in these rooms with mold-resistant paint. Stay out of the rooms that are being cleaned and painted.  Wash your hands often with soap and water. If soap and water are not available, use hand sanitizer.  Do not allow anyone to smoke in your home. General instructions  Take over-the-counter and prescription medicines only as told by your health care provider. ? Speak with your health care provider if you have questions about how or when to take the medicines. ? Make note if you are requiring more frequent dosages.  Do not use any products that contain nicotine or tobacco, such as cigarettes and e-cigarettes. If you need help quitting, ask your health care provider. Also, avoid being exposed to secondhand smoke.  Use a peak flow meter as told by your health care provider. Record and keep track of the readings.  Understand and use the asthma action plan to help minimize, or stop an asthma attack, without needing to seek medical care.  Make sure you stay up to date on your yearly vaccinations as told by your health care provider. This may include vaccines for the flu and pneumonia.  Avoid outdoor activities  when allergen counts are high and when air quality is low.  Wear a ski mask that covers your nose and mouth during outdoor winter activities. Exercise indoors on cold days if you can.  Warm up before exercising, and take time for a cool-down period after exercise.  Keep all follow-up visits as told by your health care provider. This is important. Where to find more information  For information about asthma, turn to the Centers for Disease Control and Prevention at http://www.-berg.com/.htm  For air quality information, turn to [employer] at [employer] Contact a health care provider if:  You have wheezing, shortness  of breath, or a cough even while you are taking medicine to prevent attacks.  The mucus you cough up (sputum) is thicker than usual.  Your sputum changes from clear or white to yellow, green, gray, or bloody.  Your medicines are causing side effects, such as a rash, itching, swelling, or trouble breathing.  You need to use a reliever medicine more than 2-3 times a week.  Your peak flow reading is still at 50-79% of your personal best after following your action plan for 1 hour.  You have a fever. Get help right away if:  You are getting worse and do not respond to treatment during an asthma attack.  You are short of breath when at rest or when doing very little physical activity.  You have difficulty eating, drinking, or talking.  You have chest pain or tightness.  You develop a fast heartbeat or palpitations.  You have a bluish color to your lips or fingernails.  You are light-headed or dizzy, or you faint.  Your peak flow reading is less than 50% of your personal best.  You feel too tired to breathe normally. Summary  Asthma is a long-term (chronic) condition that causes recurrent episodes in which the airways become tight and narrow. These episodes can cause coughing, wheezing, shortness of breath, and chest pain.  Asthma cannot be cured, but medicines and lifestyle changes can help control it and treat acute attacks.  Make sure you understand how to avoid triggers and how and when to use your medicines.  Asthma attacks can range from minor to life threatening. Get help right away if you have an asthma attack and do not respond to treatment with your usual rescue medicines. This information is not intended to replace advice given to you by your health care provider. Make sure you discuss any questions you have with your health care provider. Document Revised: 05/18/2018 Document Reviewed: 04/19/2016 Elsevier Patient Education  2020 [employer]  Asthma  Attack  Acute bronchospasm caused by asthma is also referred to as an asthma attack. Bronchospasm means that the air passages become narrowed or "tight," which limits the amount of oxygen that can get into the lungs. The narrowing is caused by inflammation and tightening of the muscles in the air tubes (bronchi) in the lungs. Excessive mucus is also produced, which narrows the airways more. This can cause trouble breathing, coughing, and loud breathing (wheezing). What are the causes? Possible triggers include:  Animal dander from the skin, hair, or feathers of animals.  Dust mites contained in house dust.  Cockroaches.  Pollen from trees or grass.  Mold.  Cigarette or tobacco smoke.  Air pollutants such as dust, household cleaners, hair sprays, aerosol sprays, paint fumes, strong chemicals, or strong odors.  Cold air or weather changes. Cold air may trigger inflammation. Winds increase molds and pollens in the air.  Strong  emotions such as crying or laughing hard.  Stress.  Certain medicines, such as aspirin or beta-blockers.  Sulfites in foods and drinks, such as dried fruits and wine.  Infections or inflammatory conditions, such as a flu, a cold, pneumonia, or inflammation of the nasal membranes (rhinitis).  Gastroesophageal reflux disease (GERD). GERD is a condition in which stomach acid backs up into your esophagus, which can irritate nearby airway structures.  Exercise or activity that requires a lot of energy. What are the signs or symptoms? Symptoms of this condition include:  Wheezing. This may sound like whistling while breathing. This may be more noticeable at night.  Excessive coughing, particularly at night.  Chest tightness or pain.  Shortness of breath.  Feeling like you cannot get enough air no matter how hard you try (air hunger). How is this diagnosed? This condition may be diagnosed based on:  Your medical history.  Your symptoms.  A physical  exam.  Tests to check for other causes of your symptoms or other conditions that may have triggered your asthma attack. These tests may include: ? Chest X-ray. ? Blood tests. ? Specialized tests to assess lung function, such as breathing into a device that measures how much air you inhale and exhale (spirometry). How is this treated? The goal of treatment is to open the airways in your lungs and reduce inflammation. Most asthma attacks are treated with medicines that you inhale through a hand-held inhaler (metered dose inhaler, MDI) or a device that turns liquid medicine into a mist that you inhale (nebulizer). Medicines may include:  Quick relief or rescue medicines that relax the muscles of the bronchi. These medicines include bronchodilators, such as albuterol.  Controller medicines, such as inhaled corticosteroids. These are long-acting medicines that are used for daily asthma maintenance. If you have a moderate or severe asthma attack, you may be treated with steroid medicines by mouth or through an IV injection at the hospital. Steroid medicines reduce inflammation in your lungs. Depending on the severity of your attack, you may need oxygen therapy to help you breathe. If your asthma attack was caused by a bacterial infection, such as pneumonia, you will be given antibiotic medicines. Follow these instructions at home: Medicines  Take over-the-counter and prescription medicines only as told by your health care provider. Keep your medicines up-to-date and available.  If you are more than [redacted] weeks pregnant and you are prescribed any new medicines, tell your obstetrician about those medicines.  If you were prescribed an antibiotic medicine, take it as told by your health care provider. Do not stop taking the antibiotic even if you start to feel better. Avoiding triggers   Keep track of things that trigger your asthma attacks or cause you to have breathing problems, and avoid exposure to  these triggers.  Do not use any products that contain nicotine or tobacco, such as cigarettes and e-cigarettes. If you need help quitting, ask your health care provider.  Avoid secondhand smoke.  Avoid strong smells, such as perfumes, aerosols, and cleaning solvents.  When pollen or air pollution is bad, keep windows closed and use an air conditioner or go to places with air conditioning. Asthma action plan  Work with your health care provider to make a written plan for managing and treating your asthma attacks (asthma action plan). This plan should include: ? A list of your asthma triggers and how to avoid them. ? Information about when your medicines should be taken and when their dosage should  be changed. ? Instructions about using a device called a peak flow meter to monitor your condition. A peak flow meter measures how well your lungs are working and measures how severe your asthma is at a given time. Your "personal best" is the highest peak flow rate you can reach when you feel good and have no asthma symptoms. General instructions  Avoid excessive exercise or activity until your asthma attack resolves. Ask your health care provider what activities are safe for you and when you can return to your normal activities.  Stay up to date on all vaccinations recommended by your health care provider, such as flu and pneumonia vaccines.  Drink enough fluid to keep your urine clear or pale yellow. Staying hydrated helps keep mucus in your lungs thin so it can be coughed up easily.  If you drink caffeine, do so in moderation.  Do not use alcohol until you have recovered.  Keep all follow-up visits as told by your health care provider. This is important. Asthma requires careful medical care, and you and your health care provider can work together to reduce the likelihood of future attacks. Contact a health care provider if:  Your peak flow reading is still at 50-79% of your personal best  after you have followed your action plan for 1 hour. This is in the yellow zone, which means "caution."  You need to use a reliever medicine more than 2-3 times a week.  Your medicines are causing side effects, such as: ? Rash. ? Itching. ? Swelling. ? Trouble breathing.  Your symptoms do not improve after 48 hours.  You cough up mucus (sputum) that is thicker than usual.  You have a fever.  You need to use your medicines much more frequently than normal. Get help right away if:  Your peak flow reading is less than 50% of your personal best. This is in the red zone, which means "danger."  You have severe trouble breathing.  You develop chest pain or discomfort.  Your medicines no longer seem to be helping.  You vomit.  You cannot eat or drink without vomiting.  You are coughing up yellow, green, brown, or bloody mucus.  You have a fever and your symptoms suddenly get worse.  You have trouble swallowing.  You feel very tired, and breathing becomes tiring. Summary  Acute bronchospasm caused by asthma is also referred to as an asthma attack.  Bronchospasm is caused by narrowing or tightness in air passages, which causes shortness of breath, coughing, and loud breathing (wheezing).  Many things can trigger an asthma attack, such as allergens, weather changes, exercise, smoke, and other fumes.  Treatment for an asthma attack may include inhaled rescue medicines for immediate relief, as well as the use of maintenance therapy.  Get help right away if you have worsening shortness of breath, chest pain, or fever, or if your home medicines are no longer helping with your symptoms. This information is not intended to replace advice given to you by your health care provider. Make sure you discuss any questions you have with your health care provider. Document Revised: 07/04/2018 Document Reviewed: 04/16/2016 Elsevier Patient Education  2020 [employer].

## 2020-01-22 NOTE — Progress Notes (Signed)
Established Patient Office Visit  Subjective:  Patient ID: Joel RalphBryan D Auvil, male    DOB: 06/20/1985  Age: 34 y.o. MRN: [id number]  CC:  Chief Complaint  Patient presents with  . Acute Visit    SOB/wheezing post covid    HPI Joel Carr is a 34 yo who presents for follow-up with dyspnea on exertion after a Covid infection 12/23/2019.   He telephoned on 01/16/2020 reporting having a hard time at work with breathing using his inhaler. He had mild Covid symptoms of mild congestion and body aches. He has a history of asthma as a child. He does have a Ventolin inhaler at home has used it a couple times a day.  He underwent a chest x-ray for DOE on 01/18/2020 and it did show hazy opacities bilaterally consistent with known history of prior Covid infection. He thinks wearing the mask makes his breathing worse. His chest  gets tight and he feels mild DOE with slight wheezes. Cough is minimal- not a problem. No sputum production.  He has noted no fevers or chills or body aches.  No chest pain pressure, heaviness, or tightness.  He has no body aches, feels well, no unusual fatigue. Smoking:  .5 ppd and now 5 cigarettes per day.   Past Medical History:  Diagnosis Date  . Bipolar disorder (HCC)   . COVID-19 virus infection 12/23/2019   mild congestion and body aches  . Depression   . Hyperlipidemia   . RLS (restless legs syndrome)     Past Surgical History:  Procedure Laterality Date  . KNEE ARTHROSCOPY WITH MENISCAL REPAIR Right 10/29/2014   Procedure: KNEE ARTHROSCOPY WITH MENISCAL REPAIR;  Surgeon: Kennedy BuckerMichael Menz, MD;  Location: [facility] ORS;  Service: Orthopedics;  Laterality: Right;  . KNEE SURGERY Right   . TESTICLE SURGERY N/A    as a child    Family History  Problem Relation Age of Onset  . Depression Father   . Diabetes Paternal Grandfather   . Heart attack Paternal Grandfather   . Colon cancer Neg Hx     Social History   Socioeconomic History  . Marital status: Married     Spouse name: Not on file  . Number of children: Not on file  . Years of education: 3710-  . Highest education level: GED or equivalent  Occupational History  . Not on file  Tobacco Use  . Smoking status: Current Every Day Smoker    Packs/day: 0.50    Years: 10.00    Pack years: 5.00    Types: Cigarettes  . Smokeless tobacco: Never Used  Vaping Use  . Vaping Use: Never used  Substance and Sexual Activity  . Alcohol use: No  . Drug use: No  . Sexual activity: Yes  Other Topics Concern  . Not on file  Social History Narrative   Married with 2 kids. Works at [employer]  [employer] and works as Animatorpaints equipment   Social Determinants of Health   Financial Resource Strain:   . Difficulty of Paying Living Expenses: Not on file  Food Insecurity:   . Worried About Programme researcher, broadcasting/film/videounning Out of Food in the Last Year: Not on file  . Ran Out of Food in the Last Year: Not on file  Transportation Needs:   . Lack of Transportation (Medical): Not on file  . Lack of Transportation (Non-Medical): Not on file  Physical Activity:   . Days of Exercise per Week: Not on file  . Minutes of  Exercise per Session: Not on file  Stress:   . Feeling of Stress : Not on file  Social Connections:   . Frequency of Communication with Friends and Family: Not on file  . Frequency of Social Gatherings with Friends and Family: Not on file  . Attends Religious Services: Not on file  . Active Member of Clubs or Organizations: Not on file  . Attends Banker Meetings: Not on file  . Marital Status: Not on file  Intimate Partner Violence:   . Fear of Current or Ex-Partner: Not on file  . Emotionally Abused: Not on file  . Physically Abused: Not on file  . Sexually Abused: Not on file    Outpatient Medications Prior to Visit  Medication Sig Dispense Refill  . albuterol (VENTOLIN HFA) 108 (90 Base) MCG/ACT inhaler Inhale 2 puffs into the lungs every 6 (six) hours as needed for wheezing or shortness of  breath. 18 g 0  . clonazePAM (KLONOPIN) 0.5 MG tablet Take 1 tablet (0.5 mg total) by mouth daily as needed for anxiety. 30 tablet 0  . cyclobenzaprine (FLEXERIL) 10 MG tablet Take 1 tablet (10 mg total) by mouth at bedtime as needed for muscle spasms. 30 tablet 0  . gabapentin (NEURONTIN) 300 MG capsule Take 2 capsules (600 mg total) by mouth 2 (two) times daily as needed. 180 capsule 1  . lamoTRIgine (LAMICTAL) 100 MG tablet Take 1 tablet (100 mg total) by mouth 2 (two) times daily. 180 tablet 0  . lurasidone (LATUDA) 40 MG TABS tablet Take 1 tablet (40 mg total) by mouth daily with breakfast. 90 tablet 1  . venlafaxine XR (EFFEXOR XR) 150 MG 24 hr capsule Take 1 capsule (150 mg total) by mouth daily with breakfast. 90 capsule 0  . aspirin-acetaminophen-caffeine (EXCEDRIN MIGRAINE) 250-250-65 MG tablet Take 1 tablet by mouth every 6 (six) hours as needed for headache. 30 tablet 0   No facility-administered medications prior to visit.    Allergies  Allergen Reactions  . Tramadol     Jittery    Review of Systems  Constitutional: Negative.   HENT: Negative.   Eyes: Negative.   Respiratory: Positive for cough, shortness of breath and wheezing.   Cardiovascular: Negative for chest pain, palpitations and leg swelling.  Gastrointestinal: Negative.   Endocrine: Negative.   Genitourinary: Negative.   Musculoskeletal: Negative.   Allergic/Immunologic: Negative.   Neurological: Negative for dizziness and headaches.  Hematological: Negative for adenopathy. Does not bruise/bleed easily.      Objective:    Physical Exam Vitals reviewed.  Constitutional:      Appearance: He is normal weight.  HENT:     Head: Normocephalic and atraumatic.  Eyes:     Conjunctiva/sclera: Conjunctivae normal.     Pupils: Pupils are equal, round, and reactive to light.  Cardiovascular:     Rate and Rhythm: Normal rate and regular rhythm.     Pulses: Normal pulses.     Heart sounds: Normal heart sounds.    Pulmonary:     Effort: Pulmonary effort is normal. No respiratory distress.     Breath sounds: Normal breath sounds. No wheezing, rhonchi or rales.  Abdominal:     Palpations: Abdomen is soft.     Tenderness: There is no abdominal tenderness.  Musculoskeletal:        General: Normal range of motion.     Cervical back: Normal range of motion and neck supple.  Skin:    General: Skin is warm  and dry.  Neurological:     General: No focal deficit present.     Mental Status: He is alert and oriented to person, place, and time.  Psychiatric:     Comments: Anxiety is stable today. Had a panic attack last week. Sees Psychiatry tomorrow. No SI/HI.      BP 122/84 (BP Location: Left Arm, Patient Position: Sitting, Cuff Size: Normal)   Pulse 82   Temp 98.3 F (36.8 C) (Oral)   Ht 5\' 8"  (1.727 m)   Wt 173 lb (78.5 kg)   SpO2 97%   BMI 26.30 kg/m  Wt Readings from Last 3 Encounters:  01/22/20 173 lb (78.5 kg)  01/11/20 158 lb (71.7 kg)  10/24/19 158 lb 15.2 oz (72.1 kg)   Pulse Readings from Last 3 Encounters:  01/22/20 82  10/24/19 80  08/08/19 81    BP Readings from Last 3 Encounters:  01/22/20 122/84  10/24/19 114/81  08/08/19 123/69    Lab Results  Component Value Date   CHOL 261 (H) 08/08/2019   HDL 44 08/08/2019   LDLCALC 163 (H) 08/08/2019   TRIG 287 (H) 08/08/2019   CHOLHDL 5.1 (H) 10/05/2016      Health Maintenance Due  Topic Date Due  . Hepatitis C Screening  Never done  . TETANUS/TDAP  09/05/2019    There are no preventive care reminders to display for this patient.  Lab Results  Component Value Date   TSH 0.663 08/08/2019   Lab Results  Component Value Date   WBC 13.1 (H) 08/08/2019   HGB 16.1 08/08/2019   HCT 46.0 08/08/2019   MCV 90 08/08/2019   PLT 219 08/08/2019   Lab Results  Component Value Date   NA 141 08/08/2019   K 4.1 08/08/2019   CO2 25 08/08/2019   GLUCOSE 73 08/08/2019   BUN 9 08/08/2019   CREATININE 0.85 08/08/2019    BILITOT <0.2 08/08/2019   ALKPHOS 80 08/08/2019   AST 19 08/08/2019   ALT 17 08/08/2019   PROT 6.9 08/08/2019   ALBUMIN 4.8 08/08/2019   CALCIUM 9.8 08/08/2019   ANIONGAP 7 10/19/2016   Lab Results  Component Value Date   CHOL 261 (H) 08/08/2019   Lab Results  Component Value Date   HDL 44 08/08/2019   Lab Results  Component Value Date   LDLCALC 163 (H) 08/08/2019   Lab Results  Component Value Date   TRIG 287 (H) 08/08/2019   Lab Results  Component Value Date   CHOLHDL 5.1 (H) 10/05/2016   No results found for: HGBA1C    Assessment & Plan:   Problem List Items Addressed This Visit      Other   Bipolar disorder (HCC)   Mixed hyperlipidemia   Relevant Orders   Hemoglobin A1c   Lipid panel   Anxiety   DOE (dyspnea on exertion) - Primary   Relevant Orders   Ambulatory referral to Pulmonology   History of COVID-19   Relevant Orders   Ambulatory referral to Pulmonology   Abnormal CXR   Relevant Orders   Ambulatory referral to Pulmonology   History of asthma    Other Visit Diagnoses    Encounter for preventive care       Relevant Orders   CBC with Differential/Platelet   Comprehensive metabolic panel   Hepatitis C antibody     [facility]  established care on 01/11/2020 with history of bipolar depression, anxiety, GERD, hyperlipidemia, restless leg syndrome, Covid infection in Sept.  He needed refills of his psychiatric medication.  He was previously followed by Dr. Maryruth Bun and with insurance changes no longer able to see her.  He has an appointment tomorrow to establish care with a psychiatrist at [facility]. No SI/HI. He has  2 jobs.  He underwent a chest x-ray for DOE on 01/18/2020 and it did show hazy opacities bilaterally consistent with known history of prior Covid infection. He thinks wearing the mask makes his breathing worse. He has DOE while wearing the mask- not so much without it. His chest  gets tight and he feels mild DOE with slight wheezes. Cough  is minimal- not a problem.Lungs are CTA.  O2 sats today room air 97%.  Plan: He needs routine CPE laboratory studies. Advised:  I have put a referral into pulmonology for your shortness of breath following a Covid infection, history of asthma, abnormal chest x-ray.  Trial of  Flovent which is a steroid inhaler to help with swelling, inflammation in the lungs and tightness. Use along with your albuterol.  Use the inhaler as directed twice daily.  Rinse the mouth, brush the tongue gently.  Monitor for any white thrush on the tongue and call us if this occurs.  We discussed smoking cessation.  I recommended nicotine gum and instructed patient on use. Smoking cessation information - call [place] quit line, [phone] as they have 24 hour live phone service for advice and help to quit smoking.   Consider chewing nicotine gum or lozenges.  You would use the lowest dose possible 2 mg dose.   Weeks 1-6: use 1 piece every 1-2 hours.  Do this in place of smoking.  Weeks 7-8 use 1 piece every 2-4 hours  Weeks 10-12 1 piece every 4-8 hours Duration of therapy is 12 weeks.  Follow-up office visit in 2 months.  Meds ordered this encounter  Medications  . fluticasone (FLOVENT HFA) 110 MCG/ACT inhaler    Sig: Inhale 2 puffs into the lungs 2 (two) times daily.    Dispense:  1 each    Refill:  4    Order Specific Question:   Supervising Provider    Answer:   Dale Westport [[id number]]    Follow-up: Return in about 2 months (around 03/23/2020).   This visit occurred during the SARS-CoV-2 public health emergency.  Safety protocols were in place, including screening questions prior to the visit, additional usage of staff PPE, and extensive cleaning of exam room while observing appropriate contact time as indicated for disinfecting solutions.   Amedeo Kinsman, NP

## 2020-01-23 ENCOUNTER — Encounter: Payer: Self-pay | Admitting: Nurse Practitioner

## 2020-01-23 DIAGNOSIS — Z8709 Personal history of other diseases of the respiratory system: Secondary | ICD-10-CM | POA: Insufficient documentation

## 2020-01-23 DIAGNOSIS — Z8616 Personal history of COVID-19: Secondary | ICD-10-CM | POA: Insufficient documentation

## 2020-01-23 DIAGNOSIS — R0609 Other forms of dyspnea: Secondary | ICD-10-CM | POA: Insufficient documentation

## 2020-01-23 DIAGNOSIS — R0600 Dyspnea, unspecified: Secondary | ICD-10-CM | POA: Insufficient documentation

## 2020-01-23 DIAGNOSIS — R9389 Abnormal findings on diagnostic imaging of other specified body structures: Secondary | ICD-10-CM | POA: Insufficient documentation

## 2020-01-23 LAB — LIPID PANEL
Cholesterol: 210 mg/dL — ABNORMAL HIGH (ref 0–200)
HDL: 37.2 mg/dL — ABNORMAL LOW (ref >39.00)
NonHDL: 172.44
Total CHOL/HDL Ratio: 6
Triglycerides: 221 mg/dL — ABNORMAL HIGH (ref 0.0–149.0)
VLDL: 44.2 mg/dL — ABNORMAL HIGH (ref 0.0–40.0)

## 2020-01-23 LAB — COMPREHENSIVE METABOLIC PANEL
ALT: 10 U/L (ref 0–53)
AST: 14 U/L (ref 0–37)
Albumin: 4.9 g/dL (ref 3.5–5.2)
Alkaline Phosphatase: 63 U/L (ref 39–117)
BUN: 8 mg/dL (ref 6–23)
CO2: 28 mEq/L (ref 19–32)
Calcium: 9.6 mg/dL (ref 8.4–10.5)
Chloride: 102 mEq/L (ref 96–112)
Creatinine, Ser: 0.82 mg/dL (ref 0.40–1.50)
GFR: 113.83 mL/min (ref >60.00)
Glucose, Bld: 85 mg/dL (ref 70–99)
Potassium: 3.7 mEq/L (ref 3.5–5.1)
Sodium: 138 mEq/L (ref 135–145)
Total Bilirubin: 0.4 mg/dL (ref 0.2–1.2)
Total Protein: 6.6 g/dL (ref 6.0–8.3)

## 2020-01-23 LAB — CBC WITH DIFFERENTIAL/PLATELET
Basophils Absolute: 0.1 10*3/uL (ref 0.0–0.1)
Basophils Relative: 0.7 % (ref 0.0–3.0)
Eosinophils Absolute: 0.1 10*3/uL (ref 0.0–0.7)
Eosinophils Relative: 1 % (ref 0.0–5.0)
HCT: 43.4 % (ref 39.0–52.0)
Hemoglobin: 15.3 g/dL (ref 13.0–17.0)
Lymphocytes Relative: 28.5 % (ref 12.0–46.0)
Lymphs Abs: 2.7 10*3/uL (ref 0.7–4.0)
MCHC: 35.3 g/dL (ref 30.0–36.0)
MCV: 88.8 fl (ref 78.0–100.0)
Monocytes Absolute: 0.5 10*3/uL (ref 0.1–1.0)
Monocytes Relative: 5 % (ref 3.0–12.0)
Neutro Abs: 6.2 10*3/uL (ref 1.4–7.7)
Neutrophils Relative %: 64.8 % (ref 43.0–77.0)
Platelets: 206 10*3/uL (ref 150.0–400.0)
RBC: 4.89 Mil/uL (ref 4.22–5.81)
RDW: 13.2 % (ref 11.5–15.5)
WBC: 9.5 10*3/uL (ref 4.0–10.5)

## 2020-01-23 LAB — HEMOGLOBIN A1C: Hgb A1c MFr Bld: 5.6 % (ref 4.6–6.5)

## 2020-01-23 LAB — LDL CHOLESTEROL, DIRECT: Direct LDL: 125 mg/dL

## 2020-01-24 LAB — HEPATITIS C ANTIBODY
Hepatitis C Ab: NONREACTIVE
SIGNAL TO CUT-OFF: 0.01 (ref <1.00)

## 2020-02-08 ENCOUNTER — Ambulatory Visit: Payer: 59 | Admitting: Nurse Practitioner

## 2020-02-08 ENCOUNTER — Ambulatory Visit: Payer: 59 | Admitting: Family Medicine

## 2020-02-12 ENCOUNTER — Telehealth (INDEPENDENT_AMBULATORY_CARE_PROVIDER_SITE_OTHER): Payer: 59 | Admitting: Nurse Practitioner

## 2020-02-12 ENCOUNTER — Encounter: Payer: Self-pay | Admitting: Nurse Practitioner

## 2020-02-12 VITALS — Temp 99.7°F

## 2020-02-12 DIAGNOSIS — R52 Pain, unspecified: Secondary | ICD-10-CM | POA: Diagnosis not present

## 2020-02-12 DIAGNOSIS — T50Z95A Adverse effect of other vaccines and biological substances, initial encounter: Secondary | ICD-10-CM | POA: Diagnosis not present

## 2020-02-12 NOTE — Progress Notes (Addendum)
Temp 99.7 F (37.6 C) (Oral)    Subjective:    Patient ID: Joel Carr, male    DOB: 1986/02/08, 34 y.o.   MRN: [id number]  HPI: Joel Carr is a 34 y.o. male presenting with body aches, low grade fever, and headaches.  Chief Complaint  Patient presents with  . Covid Vaccine     pt states he got the Moderna vaccine this past Sunday. Since then, he has gotten body aches, low grade fever, and headaches. Has been taking tylenol and ibuprofen since symptoms started. Requesting to have work note for today and any days after.    UPPER RESPIRATORY TRACT INFECTION Reports he got the Moderna vaccine Sunday afternoon.  Symptoms started yesterday morning and have gotten worse today.  Onset: Sunday, 11/14 Worst symptom: body aches Body aches: yes Fever: yes; low grade 99 Cough: no Shortness of breath: no Wheezing: no Chest pain: yes Chest tightness: no Chest congestion: yes Nasal congestion: yes; clear Runny nose: no Post nasal drip: yes Sneezing: yes Sore throat: yes Swollen glands: no Sinus pressure: no Headache: yes; pounding Face pain: no Toothache: no Ear pain: no  Ear pressure: no  Eyes red/itching:no Eye drainage/crusting: no  Nausea: yes Vomiting: no Diarrhea: no Change in appetite: yes; decreased  Loss of taste/smell: no Rash: no Fatigue: yes Sick contacts: no Strep contacts: no  Context: worse Recurrent sinusitis: no Treatments attempted: Tylenol and ibuprofen Relief with OTC medications: no  Allergies  Allergen Reactions  . Tramadol     Jittery   Outpatient Encounter Medications as of 02/12/2020  Medication Sig  . albuterol (VENTOLIN HFA) 108 (90 Base) MCG/ACT inhaler Inhale 2 puffs into the lungs every 6 (six) hours as needed for wheezing or shortness of breath.  . clonazePAM (KLONOPIN) 0.5 MG tablet Take 1 tablet (0.5 mg total) by mouth daily as needed for anxiety.  . cyclobenzaprine (FLEXERIL) 10 MG tablet Take 1 tablet (10 mg total) by mouth at  bedtime as needed for muscle spasms.  . fluticasone (FLOVENT HFA) 110 MCG/ACT inhaler Inhale 2 puffs into the lungs 2 (two) times daily.  Marland Kitchen gabapentin (NEURONTIN) 300 MG capsule Take 2 capsules (600 mg total) by mouth 2 (two) times daily as needed.  . lamoTRIgine (LAMICTAL) 100 MG tablet Take 1 tablet (100 mg total) by mouth 2 (two) times daily.  Marland Kitchen lurasidone (LATUDA) 40 MG TABS tablet Take 1 tablet (40 mg total) by mouth daily with breakfast.  . QUEtiapine (SEROQUEL) 25 MG tablet Take 25 mg by mouth daily as needed.  . venlafaxine XR (EFFEXOR-XR) 75 MG 24 hr capsule Take 75 mg by mouth daily.  . [DISCONTINUED] venlafaxine XR (EFFEXOR XR) 150 MG 24 hr capsule Take 1 capsule (150 mg total) by mouth daily with breakfast.   No facility-administered encounter medications on file as of 02/12/2020.   Patient Active Problem List   Diagnosis Date Noted  . Body aches after vaccination 02/12/2020  . DOE (dyspnea on exertion) 01/23/2020  . History of COVID-19 01/23/2020  . Abnormal CXR 01/23/2020  . History of asthma 01/23/2020  . Acute non-recurrent maxillary sinusitis 10/04/2019  . Finger pain, left 07/17/2019  . Flank pain 05/08/2019  . Tension headache 05/08/2019  . Anxiety 10/23/2018  . GERD (gastroesophageal reflux disease) 07/07/2018  . Insomnia 07/07/2018  . Nodule of skin of abdomen 02/08/2018  . Impingement syndrome of shoulder region 10/10/2017  . RLS (restless legs syndrome) 02/02/2017  . Mixed hyperlipidemia 04/10/2015  . Bipolar disorder (  HCC) 01/01/2015   Past Medical History:  Diagnosis Date  . Bipolar disorder (HCC)   . COVID-19 virus infection 12/23/2019   mild congestion and body aches  . Depression   . Hyperlipidemia   . RLS (restless legs syndrome)    Relevant past medical, surgical, family and social history reviewed and updated as indicated. Interim medical history since our last visit reviewed.  Review of Systems  Constitutional: Positive for activity change,  appetite change, fatigue and fever.  HENT: Positive for congestion, postnasal drip, rhinorrhea, sneezing and sore throat. Negative for dental problem, ear discharge, ear pain, nosebleeds, sinus pressure, sinus pain and trouble swallowing.   Eyes: Negative.  Negative for pain, discharge, redness and itching.  Respiratory: Positive for cough. Negative for chest tightness, shortness of breath and wheezing.   Cardiovascular: Positive for chest pain (when coughing). Negative for palpitations.  Gastrointestinal: Positive for nausea. Negative for constipation, diarrhea and vomiting.  Musculoskeletal: Positive for myalgias.  Neurological: Positive for headaches. Negative for dizziness, weakness and light-headedness.  Psychiatric/Behavioral: Negative.     Per HPI unless specifically indicated above     Objective:    Temp 99.7 F (37.6 C) (Oral)   Wt Readings from Last 3 Encounters:  01/22/20 173 lb (78.5 kg)  01/11/20 158 lb (71.7 kg)  10/24/19 158 lb 15.2 oz (72.1 kg)    Physical Exam Vitals and nursing note reviewed.  Constitutional:      General: He is not in acute distress.    Appearance: He is normal weight. He is not ill-appearing or toxic-appearing.  HENT:     Head: Normocephalic and atraumatic.     Right Ear: External ear normal.     Left Ear: External ear normal.     Nose: Congestion present. No rhinorrhea.     Mouth/Throat:     Mouth: Mucous membranes are moist.     Pharynx: Oropharynx is clear. No oropharyngeal exudate or posterior oropharyngeal erythema.  Eyes:     General: No scleral icterus.       Right eye: No discharge.        Left eye: No discharge.     Extraocular Movements: Extraocular movements intact.  Cardiovascular:     Comments: Unable to assess heart sounds via virtual visit. Pulmonary:     Effort: Pulmonary effort is normal. No respiratory distress.     Comments: Unable to assess lung sounds via virtual visit.  Patient talking in complete sentences.  No  accessory muscle use. Abdominal:     General: Abdomen is flat. There is no distension.     Comments: Unable to assess bowel sounds via virtual visit  Skin:    Coloration: Skin is not jaundiced or pale.     Findings: No erythema.  Neurological:     General: No focal deficit present.     Mental Status: He is alert and oriented to person, place, and time.     Motor: No weakness.  Psychiatric:        Mood and Affect: Mood normal.        Behavior: Behavior normal.        Thought Content: Thought content normal.        Judgment: Judgment normal.       Assessment & Plan:   Problem List Items Addressed This Visit      Other   Body aches after vaccination - Primary    Acute, ongoing.  Reassured patient that symptoms most likely related to immune response  from recent COVID vaccination.  Continue supportive care. Increase fluids. Encouraged acetaminophen / ibuprofen as needed for fever/pain. Encouraged salt water gargling, chloraseptic spray and throat lozenges. Encouraged Mucinex. Encouraged saline sinus flushes. Recommended neti pot. Encouraged humidified air. Note for work given - want to be fever free for >48 hours with no fever reducers. Follow up by end of week if not feeling better.            Follow up plan: Return if symptoms worsen or fail to improve.    Due to the catastrophic nature of the COVID-19 pandemic, this visit was completed via audio and visual contact via [employer] due to the restrictions of the COVID-19 pandemic. All issues as above were discussed and addressed. Physical exam was done as above through visual confirmation on [employer]. If it was felt that the patient should be evaluated in the office, they were directed there. The patient verbally consented to this visit."} . Location of the patient: home . Location of the provider: work . Those involved with this call:  . Provider: Mardene Celeste, DNP . CMA: Wilhemena Durie, CMA . Front Desk/Registration:  Harriet Pho  . Time spent on call: 15 minutes with patient face to face via video conference. More than 50% of this time was spent in counseling and coordination of care. 30 minutes total spent in review of patient's record and preparation of their chart.  I verified patient identity using two factors (patient name and date of birth). Patient consents verbally to being seen via telemedicine visit today.

## 2020-02-12 NOTE — Assessment & Plan Note (Signed)
Acute, ongoing.  Reassured patient that symptoms most likely related to immune response from recent COVID vaccination.  Continue supportive care. Increase fluids. Encouraged acetaminophen / ibuprofen as needed for fever/pain. Encouraged salt water gargling, chloraseptic spray and throat lozenges. Encouraged Mucinex. Encouraged saline sinus flushes. Recommended neti pot. Encouraged humidified air. Note for work given - want to be fever free for >48 hours with no fever reducers. Follow up by end of week if not feeling better.

## 2020-02-15 ENCOUNTER — Ambulatory Visit: Payer: 59 | Admitting: Nurse Practitioner

## 2020-02-20 ENCOUNTER — Encounter: Payer: Self-pay | Admitting: Internal Medicine

## 2020-02-20 ENCOUNTER — Other Ambulatory Visit: Payer: Self-pay

## 2020-02-20 ENCOUNTER — Other Ambulatory Visit
Admission: RE | Admit: 2020-02-20 | Discharge: 2020-02-20 | Disposition: A | Discharge status: Home / Self Care | Payer: 59 | Attending: Internal Medicine | Admitting: Internal Medicine

## 2020-02-20 ENCOUNTER — Ambulatory Visit (INDEPENDENT_AMBULATORY_CARE_PROVIDER_SITE_OTHER): Payer: 59 | Admitting: Internal Medicine

## 2020-02-20 VITALS — BP 126/78 | HR 88 | Ht 68.0 in | Wt 168.6 lb

## 2020-02-20 DIAGNOSIS — R9389 Abnormal findings on diagnostic imaging of other specified body structures: Secondary | ICD-10-CM

## 2020-02-20 DIAGNOSIS — G9332 Myalgic encephalomyelitis/chronic fatigue syndrome: Secondary | ICD-10-CM

## 2020-02-20 DIAGNOSIS — R053 Chronic cough: Secondary | ICD-10-CM | POA: Diagnosis not present

## 2020-02-20 DIAGNOSIS — R0600 Dyspnea, unspecified: Secondary | ICD-10-CM | POA: Insufficient documentation

## 2020-02-20 DIAGNOSIS — R0609 Other forms of dyspnea: Secondary | ICD-10-CM

## 2020-02-20 DIAGNOSIS — R0602 Shortness of breath: Secondary | ICD-10-CM

## 2020-02-20 DIAGNOSIS — U099 Post covid-19 condition, unspecified: Secondary | ICD-10-CM

## 2020-02-20 DIAGNOSIS — R5382 Chronic fatigue, unspecified: Secondary | ICD-10-CM

## 2020-02-20 DIAGNOSIS — Z8616 Personal history of COVID-19: Secondary | ICD-10-CM

## 2020-02-20 LAB — CBC WITH DIFFERENTIAL/PLATELET
Abs Immature Granulocytes: 0.05 10*3/uL (ref 0.00–0.07)
Basophils Absolute: 0 10*3/uL (ref 0.0–0.1)
Basophils Relative: 0 %
Eosinophils Absolute: 0 10*3/uL (ref 0.0–0.5)
Eosinophils Relative: 0 %
HCT: 44 % (ref 39.0–52.0)
Hemoglobin: 15.2 g/dL (ref 13.0–17.0)
Immature Granulocytes: 0 %
Lymphocytes Relative: 23 %
Lymphs Abs: 2.8 10*3/uL (ref 0.7–4.0)
MCH: 30.8 pg (ref 26.0–34.0)
MCHC: 34.5 g/dL (ref 30.0–36.0)
MCV: 89.2 fL (ref 80.0–100.0)
Monocytes Absolute: 0.4 10*3/uL (ref 0.1–1.0)
Monocytes Relative: 3 %
Neutro Abs: 8.9 10*3/uL — ABNORMAL HIGH (ref 1.7–7.7)
Neutrophils Relative %: 74 %
Platelets: 235 10*3/uL (ref 150–400)
RBC: 4.93 MIL/uL (ref 4.22–5.81)
RDW: 12.2 % (ref 11.5–15.5)
WBC: 12.2 10*3/uL — ABNORMAL HIGH (ref 4.0–10.5)
nRBC: 0 % (ref 0.0–0.2)

## 2020-02-20 LAB — SEDIMENTATION RATE: Sed Rate: 3 mm/hr (ref 0–15)

## 2020-02-20 NOTE — Progress Notes (Signed)
OV 02/20/2020  Subjective:  Patient ID: Joel Carr, male , DOB: 15-Apr-1985 , age 34 y.o. , MRN: [id number] , ADDRESS: [address] [place] [address] PCP Marval Regal, NP Patient Care Team: Marval Regal, NP as PCP - General (Nurse Practitioner)  This Provider for this visit: Treatment Team:  Attending Provider: Brand Males, MD    02/20/2020 -   Chief Complaint  Patient presents with  . pulmonary consult    per Denice Paradise, NP--hx of covid 11/2019. c/o sob with exertion and dry cough.     HPI Joel Carr 34 y.o. -presents for new evaluation of cough and shortness of breath and fatigue in the setting of post Covid.  He tells me that he works at [employer].  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  End of September 2020 when he got exposed to Covid and he developed COVID-19.  He quarantined at home and resolved naturally.  He started going back to work at the end of the 2-week mark.  He did not get hospitalized at all.  It was after 3 weeks that he started feeling that he could actually be able to work.  Nevertheless while at work he is noticing that with exertion and moving objects he has shortness of breath relieved by rest.  Present with exertion.  Severity of shortness of breath as documented below.  He also has associated cough it is mild.  Early on post Covid he is to wake up in the middle of the night but not anymore.  He has significant associated fatigue.  A few weeks ago primary care nurse practitioner gave him Flovent and he states an improvement with this.  He is overall 70% better but still has residual symptoms current symptomatology is reflected below is for the last 1 week.  Therefore has been referred here.  He did have a chest x-ray that showed interstitial markings in the post Covid setting.  He never got hospitalized now is needing oxygen.  No prior history of asthma.  Only has  bipolar disease.  No palpitations or dizziness  SYMPTOM SCALE  X last 1 week 02/20/2020   O2 use ra  Shortness of Breath 0 -> 5 scale with 5 being worst (score 6 If unable to do)  At rest 0  Simple tasks - showers, clothes change, eating, shaving 1  Household (dishes, doing bed, laundry) 2  Shopping 2  Walking level at own pace 3  Walking up Stairs 4  Total (30-36) Dyspnea Score 12  How bad is your cough? 1  How bad is your fatigue 3  How bad is nausea 0  How bad is vomiting?  0  How bad is diarrhea? 0  How bad is anxiety? 2  How bad is depression 2        Simple office walk 185 feet x  3 laps goal with forehead probe 02/20/2020   O2 used ra  Number laps completed 3  Comments about pace rapid  Resting Pulse Ox/HR 98% and 82/min  Final Pulse Ox/HR 98% and 97/min  Desaturated </= 88% no  Desaturated <= 3% points no  Got Tachycardic >/= 90/min yes  Symptoms at end of test Dyspnea mild  Miscellaneous comments x     Results for Joel Carr, Joel Carr (MRN [id number]) as of 02/20/2020 10:28  Ref. Range 01/22/2020 16:40  Hemoglobin Latest Ref Range: 13.0 -  17.0 g/dL 15.3   Results for Joel Carr, Joel Carr (MRN [id number]) as of 02/20/2020 10:28  Ref. Range 01/22/2020 16:40  Eosinophils Absolute Latest Ref Range: 0.0 - 0.7 K/uL 0.1   ROS - per HPI  Results for Joel Carr, Joel Carr (MRN [id number]) as of 02/20/2020 10:28  Ref. Range 01/22/2020 16:40  Creatinine Latest Ref Range: 0.40 - 1.50 mg/dL 0.82      IMPRESSION: Hazy opacities bilaterally consistent with the known history of prior COVID infection.   Electronically Signed   By: Inez Catalina M.D.   On: 01/18/2020 23:41    has a past medical history of Bipolar disorder ([place]), COVID-19 virus infection (12/23/2019), Depression, Hyperlipidemia, and RLS (restless legs syndrome).   reports that he has been smoking cigarettes. He has a 18.00 pack-year smoking history. He has never used smokeless tobacco.  Past Surgical  History:  Procedure Laterality Date  . KNEE ARTHROSCOPY WITH MENISCAL REPAIR Right 10/29/2014   Procedure: KNEE ARTHROSCOPY WITH MENISCAL REPAIR;  Surgeon: Hessie Knows, MD;  Location: [facility] ORS;  Service: Orthopedics;  Laterality: Right;  . KNEE SURGERY Right   . TESTICLE SURGERY N/A    as a child    Allergies  Allergen Reactions  . Tramadol     Jittery    Immunization History  Administered Date(s) Administered  . DTaP 11/11/1987  . Hepatitis B 01/09/1996, 02/13/1996, 07/23/1996  . IPV 11/11/1987  . Influenza,inj,Quad PF,6+ Mos 01/31/2017  . Influenza-Unspecified 11/27/2014, 12/28/2015, 12/28/2017  . MMR 11/11/1987  . Moderna SARS-COVID-2 Vaccination 02/10/2020  . Td 09/04/2009    Family History  Problem Relation Age of Onset  . Depression Father   . Diabetes Paternal Grandfather   . Heart attack Paternal Grandfather   . Colon cancer Neg Hx      Current Outpatient Medications:  .  albuterol (VENTOLIN HFA) 108 (90 Base) MCG/ACT inhaler, Inhale 2 puffs into the lungs every 6 (six) hours as needed for wheezing or shortness of breath., Disp: 18 g, Rfl: 0 .  clonazePAM (KLONOPIN) 0.5 MG tablet, Take 1 tablet (0.5 mg total) by mouth daily as needed for anxiety., Disp: 30 tablet, Rfl: 0 .  cyclobenzaprine (FLEXERIL) 10 MG tablet, Take 1 tablet (10 mg total) by mouth at bedtime as needed for muscle spasms., Disp: 30 tablet, Rfl: 0 .  fluticasone (FLOVENT HFA) 110 MCG/ACT inhaler, Inhale 2 puffs into the lungs 2 (two) times daily., Disp: 1 each, Rfl: 4 .  gabapentin (NEURONTIN) 300 MG capsule, Take 2 capsules (600 mg total) by mouth 2 (two) times daily as needed., Disp: 180 capsule, Rfl: 1 .  lamoTRIgine (LAMICTAL) 100 MG tablet, Take 1 tablet (100 mg total) by mouth 2 (two) times daily., Disp: 180 tablet, Rfl: 0 .  lurasidone (LATUDA) 40 MG TABS tablet, Take 1 tablet (40 mg total) by mouth daily with breakfast., Disp: 90 tablet, Rfl: 1 .  QUEtiapine (SEROQUEL) 25 MG tablet, Take 25  mg by mouth daily as needed., Disp: , Rfl:       Objective:   Vitals:   02/20/20 1007  BP: 126/78  Pulse: 88  SpO2: 99%  Weight: 168 lb 9.6 oz (76.5 kg)  Height: _0  (1.727 m)    Estimated body mass index is 25.64 kg/m as calculated from the following:   Height as of this encounter: _1  (1.727 m).   Weight as of this encounter: 168 lb 9.6 oz (76.5 kg).  _2 @  Filed Weights   02/20/20 1007  Weight: 168  lb 9.6 oz (76.5 kg)     Physical Exam  General Appearance:    Alert, cooperative, no distress, appears stated age - yes , Deconditioned looking - no , OBESE  - no, Sitting on Wheelchair -  no  Head:    Normocephalic, without obvious abnormality, atraumatic  Eyes:    PERRL, conjunctiva/corneas clear,  Ears:    Normal TM's and external ear canals, both ears  Nose:   Nares normal, septum midline, mucosa normal, no drainage    or sinus tenderness. OXYGEN ON  - no . Patient is @ ra   Throat:   Lips, mucosa, and tongue normal; teeth and gums normal. Cyanosis on lips - no  Neck:   Supple, symmetrical, trachea midline, no adenopathy;    thyroid:  no enlargement/tenderness/nodules; no carotid   bruit or JVD  Back:     Symmetric, no curvature, ROM normal, no CVA tenderness  Lungs:     Distress - no , Wheeze n, Barrell Chest - ono, Purse lip breathing - no, Crackles - no   Chest Wall:    No tenderness or deformity.    Heart:    Regular rate and rhythm, S1 and S2 normal, no rub   or gallop, Murmur - no  Breast Exam:    NOT DONE  Abdomen:     Soft, non-tender, bowel sounds active all four quadrants,    no masses, no organomegaly. Visceral obesity - no  Genitalia:   NOT DONE  Rectal:   NOT DONE  Extremities:   Extremities - normal, Has Cane - no, Clubbing - no, Edema - no  Pulses:   2+ and symmetric all extremities  Skin:   Stigmata of Connective Tissue Disease - no. TATOO +  Lymph nodes:   Cervical, supraclavicular, and axillary nodes normal  Psychiatric:   Neurologic:   Pleasant - yes, Anxious - no, Flat affect - no  CAm-ICU - neg, Alert and Oriented x 3 - yes, Moves all 4s - yes, Speech - normal, Cognition - intact         Assessment:       ICD-10-CM   1. DOE (dyspnea on exertion)  R06.00 ECHOCARDIOGRAM COMPLETE    CBC w/Diff    IgE    Sedimentation rate    Pulmonary Function Test [facility] Only  2. Chronic cough  R05.3   3. COVID-19 long hauler manifesting chronic dyspnea  R06.09 ECHOCARDIOGRAM COMPLETE   U09.9   4. COVID-19 long hauler manifesting chronic cough  R05.3    U09.9   5. Abnormal CXR  R93.89   6. COVID-19 long hauler manifesting chronic fatigue  R53.82    U09.9   7. History of COVID-19  Z86.16   8. Shortness of breath  R06.02 CT Chest High Resolution   Appears to have post Covid long-haul shortness of breath and fatigue and cough.  This could be reactive airways and will settle down.  Need to rule out ILD.  Need to rule out any cardiomyopathy.  Nothing to suspect pots syndrome in the post Covid setting so far.    Plan:     Patient Instructions     ICD-10-CM   1. DOE (dyspnea on exertion)  R06.00 ECHOCARDIOGRAM COMPLETE    CBC w/Diff    IgE    Sedimentation rate    Pulmonary Function Test [facility] Only  2. Chronic cough  R05.3   3. COVID-19 long hauler manifesting chronic dyspnea  R06.09 ECHOCARDIOGRAM COMPLETE   U09.9  4. COVID-19 long hauler manifesting chronic cough  R05.3    U09.9   5. Abnormal CXR  R93.89   6. COVID-19 long hauler manifesting chronic fatigue  R53.82    U09.9   7. History of COVID-19  Z86.16   8. Shortness of breath  R06.02 CT Chest High Resolution   Likely post covid reactive airways that is Improving with/without flovent Need to rule out heart issues and covid lung damage  Plan Do cbc with diff, blood IgE, and ESR Do HRCT supine and prone Get echo Get full PFT  Followup  -return to see APP next few weeks to seveal weeks but after completing above   - but need not wait for PFT  results to see APP as long as < 4 weeks    based on blood work and CT scan might consider short course of steroids +/-referral for ZIO monitor for rule out pots syndrome versus expectant approach  SIGNATURE    Dr. Brand Males, M.D., F.C.C.P,  Pulmonary and Critical Care Medicine Staff Physician, [place] Director - Interstitial Lung Disease  Program  [facility] [place] [facility] [place], [place], [address]  Pager: [phone], If no answer or between  15:00h - 7:00h: call [phone] Telephone: [phone]  10:48 AM 02/20/2020

## 2020-02-20 NOTE — Patient Instructions (Addendum)
ICD-10-CM   1. DOE (dyspnea on exertion)  R06.00 ECHOCARDIOGRAM COMPLETE    CBC w/Diff    IgE    Sedimentation rate    Pulmonary Function Test [facility] Only  2. Chronic cough  R05.3   3. COVID-19 long hauler manifesting chronic dyspnea  R06.09 ECHOCARDIOGRAM COMPLETE   U09.9   4. COVID-19 long hauler manifesting chronic cough  R05.3    U09.9   5. Abnormal CXR  R93.89   6. COVID-19 long hauler manifesting chronic fatigue  R53.82    U09.9   7. History of COVID-19  Z86.16   8. Shortness of breath  R06.02 CT Chest High Resolution   Likely post covid reactive airways that is Improving with/without flovent Need to rule out heart issues and covid lung damage  Plan Do cbc with diff, blood IgE, and ESR Do HRCT supine and prone Get echo Get full PFT  Followup  -return to see APP next few weeks to seveal weeks but after completing above   - but need not wait for PFT results to see APP as  as < 4 weeks  -   Addendum:  based on blood work and CT scan might consider short course of steroids +/-referral for ZIO monitor for rule out pots syndrome versus expectant approach

## 2020-02-20 NOTE — Addendum Note (Signed)
Addended by: Erling Conte on: 02/20/2020 11:20 AM   Modules accepted: Orders

## 2020-02-20 NOTE — Addendum Note (Signed)
Addended by: Kennedi Lizardo M on: 02/20/2020 11:20 AM   Modules accepted: Orders  

## 2020-02-22 LAB — IGE: IgE (Immunoglobulin E), Serum: 5 IU/mL — ABNORMAL LOW (ref 6–495)

## 2020-03-03 ENCOUNTER — Telehealth: Payer: Self-pay

## 2020-03-03 NOTE — Telephone Encounter (Signed)
Patient is aware of date/time of covid test prior to PFT.  

## 2020-03-04 ENCOUNTER — Other Ambulatory Visit: Payer: Self-pay

## 2020-03-04 ENCOUNTER — Ambulatory Visit
Admission: RE | Admit: 2020-03-04 | Discharge: 2020-03-04 | Disposition: A | Discharge status: Home / Self Care | Payer: 59 | Source: Ambulatory Visit | Attending: Internal Medicine | Admitting: Internal Medicine

## 2020-03-04 DIAGNOSIS — R0602 Shortness of breath: Secondary | ICD-10-CM | POA: Diagnosis not present

## 2020-03-05 ENCOUNTER — Other Ambulatory Visit
Admission: RE | Admit: 2020-03-05 | Discharge: 2020-03-05 | Disposition: A | Discharge status: Home / Self Care | Payer: 59 | Source: Ambulatory Visit | Attending: Internal Medicine | Admitting: Internal Medicine

## 2020-03-05 DIAGNOSIS — Z01812 Encounter for preprocedural laboratory examination: Secondary | ICD-10-CM | POA: Diagnosis not present

## 2020-03-05 DIAGNOSIS — Z20822 Contact with and (suspected) exposure to covid-19: Secondary | ICD-10-CM | POA: Insufficient documentation

## 2020-03-05 LAB — SARS CORONAVIRUS 2 (TAT 6-24 HRS): SARS Coronavirus 2: NEGATIVE

## 2020-03-06 ENCOUNTER — Ambulatory Visit: Payer: 59

## 2020-03-06 ENCOUNTER — Other Ambulatory Visit: Payer: Self-pay

## 2020-03-06 ENCOUNTER — Ambulatory Visit
Admission: RE | Admit: 2020-03-06 | Discharge: 2020-03-06 | Disposition: A | Discharge status: Home / Self Care | Payer: 59 | Source: Ambulatory Visit | Attending: Internal Medicine | Admitting: Internal Medicine

## 2020-03-06 DIAGNOSIS — U099 Post covid-19 condition, unspecified: Secondary | ICD-10-CM | POA: Diagnosis not present

## 2020-03-06 DIAGNOSIS — I779 Disorder of arteries and arterioles, unspecified: Secondary | ICD-10-CM | POA: Insufficient documentation

## 2020-03-06 DIAGNOSIS — R0609 Other forms of dyspnea: Secondary | ICD-10-CM | POA: Diagnosis not present

## 2020-03-06 DIAGNOSIS — Z8249 Family history of ischemic heart disease and other diseases of the circulatory system: Secondary | ICD-10-CM | POA: Diagnosis not present

## 2020-03-06 DIAGNOSIS — R0600 Dyspnea, unspecified: Secondary | ICD-10-CM

## 2020-03-06 DIAGNOSIS — F17200 Nicotine dependence, unspecified, uncomplicated: Secondary | ICD-10-CM | POA: Diagnosis not present

## 2020-03-06 LAB — ECHOCARDIOGRAM COMPLETE
AR max vel: 3.39 cm2
AV Area VTI: 3.7 cm2
AV Area mean vel: 3.58 cm2
AV Mean grad: 2 mmHg
AV Peak grad: 4.8 mmHg
Ao pk vel: 1.09 m/s
Area-P 1/2: 3.79 cm2
S' Lateral: 3.02 cm

## 2020-03-06 MED ORDER — ALBUTEROL SULFATE (2.5 MG/3ML) 0.083% IN NEBU
2.5000 mg | INHALATION_SOLUTION | Freq: Once | RESPIRATORY_TRACT | Status: AC
  Administered 2020-03-06: 2.5 mg via RESPIRATORY_TRACT
  Filled 2020-03-06: qty 3

## 2020-03-06 NOTE — Progress Notes (Signed)
*  PRELIMINARY RESULTS* Echocardiogram 2D Echocardiogram has been performed.  Joel Carr Joel Carr 03/06/2020, 11:26 AM

## 2020-03-10 ENCOUNTER — Encounter: Payer: Self-pay | Admitting: Nurse Practitioner

## 2020-03-11 LAB — PULMONARY FUNCTION TEST ARMC ONLY
DL/VA % pred: 87 %
DL/VA: 4.2 ml/min/mmHg/L
DLCO unc % pred: 73 %
DLCO unc: 22.01 ml/min/mmHg
FEF 25-75 Post: 4.31 L/sec
FEF 25-75 Pre: 2.06 L/sec
FEF2575-%Change-Post: 108 %
FEF2575-%Pred-Post: 106 %
FEF2575-%Pred-Pre: 51 %
FEV1-%Change-Post: 47 %
FEV1-%Pred-Post: 89 %
FEV1-%Pred-Pre: 60 %
FEV1-Post: 3.66 L
FEV1-Pre: 2.48 L
FEV1FVC-%Change-Post: 40 %
FEV1FVC-%Pred-Pre: 74 %
FEV6-%Change-Post: 10 %
FEV6-%Pred-Post: 86 %
FEV6-%Pred-Pre: 78 %
FEV6-Post: 4.31 L
FEV6-Pre: 3.9 L
FEV6FVC-%Change-Post: 0 %
FEV6FVC-%Pred-Post: 101 %
FEV6FVC-%Pred-Pre: 101 %
FVC-%Change-Post: 4 %
FVC-%Pred-Post: 85 %
FVC-%Pred-Pre: 81 %
FVC-Post: 4.31 L
Post FEV1/FVC ratio: 85 %
Post FEV6/FVC ratio: 100 %
Pre FEV1/FVC ratio: 60 %
Pre FEV6/FVC Ratio: 100 %
RV % pred: 87 %
RV: 1.41 L
TLC % pred: 87 %
TLC: 5.72 L

## 2020-04-02 ENCOUNTER — Other Ambulatory Visit: Payer: Self-pay

## 2020-04-02 DIAGNOSIS — Z20822 Contact with and (suspected) exposure to covid-19: Secondary | ICD-10-CM

## 2020-04-03 LAB — NOVEL CORONAVIRUS, NAA: SARS-CoV-2, NAA: NOT DETECTED

## 2020-04-03 LAB — SARS-COV-2, NAA 2 DAY TAT

## 2020-04-14 NOTE — Progress Notes (Signed)
Post covid ct is fine except super mild emphysema. Unclear clinical significance. Plan - complete rest of workup from recent OV. Check blood A1 AT. Return to see app

## 2020-04-14 NOTE — Progress Notes (Signed)
Let him know post covid echo is normal

## 2020-04-16 ENCOUNTER — Other Ambulatory Visit: Payer: Self-pay

## 2020-04-16 DIAGNOSIS — J438 Other emphysema: Secondary | ICD-10-CM

## 2020-04-29 ENCOUNTER — Ambulatory Visit (INDEPENDENT_AMBULATORY_CARE_PROVIDER_SITE_OTHER): Payer: Managed Care, Other (non HMO) | Admitting: Pulmonary Disease

## 2020-04-29 ENCOUNTER — Other Ambulatory Visit: Payer: Self-pay

## 2020-04-29 ENCOUNTER — Encounter: Payer: Self-pay | Admitting: Pulmonary Disease

## 2020-04-29 ENCOUNTER — Other Ambulatory Visit
Admission: RE | Admit: 2020-04-29 | Discharge: 2020-04-29 | Disposition: A | Discharge status: Home / Self Care | Payer: Managed Care, Other (non HMO) | Source: Ambulatory Visit | Attending: Pulmonary Disease | Admitting: Pulmonary Disease

## 2020-04-29 VITALS — BP 124/76 | HR 68 | Temp 98.2°F | Ht 68.0 in | Wt 168.0 lb

## 2020-04-29 DIAGNOSIS — Z8616 Personal history of COVID-19: Secondary | ICD-10-CM | POA: Diagnosis not present

## 2020-04-29 DIAGNOSIS — R0609 Other forms of dyspnea: Secondary | ICD-10-CM

## 2020-04-29 DIAGNOSIS — R0600 Dyspnea, unspecified: Secondary | ICD-10-CM

## 2020-04-29 DIAGNOSIS — F17210 Nicotine dependence, cigarettes, uncomplicated: Secondary | ICD-10-CM | POA: Diagnosis not present

## 2020-04-29 DIAGNOSIS — Z87891 Personal history of nicotine dependence: Secondary | ICD-10-CM | POA: Insufficient documentation

## 2020-04-29 DIAGNOSIS — J438 Other emphysema: Secondary | ICD-10-CM | POA: Insufficient documentation

## 2020-04-29 DIAGNOSIS — F17200 Nicotine dependence, unspecified, uncomplicated: Secondary | ICD-10-CM | POA: Insufficient documentation

## 2020-04-29 MED ORDER — NICOTINE 14 MG/24HR TD PT24: 14.0000 mg | MEDICATED_PATCH | Freq: Every day | TRANSDERMAL | 3 refills | Status: DC

## 2020-04-29 MED ORDER — BREZTRI AEROSPHERE 160-9-4.8 MCG/ACT IN AERO: 2.0000 | INHALATION_SPRAY | Freq: Two times a day (BID) | RESPIRATORY_TRACT | 0 refills | Status: DC

## 2020-04-29 MED ORDER — BREZTRI AEROSPHERE 160-9-4.8 MCG/ACT IN AERO: 2.0000 | INHALATION_SPRAY | Freq: Two times a day (BID) | RESPIRATORY_TRACT | 0 refills | Status: AC

## 2020-04-29 MED ORDER — NICOTINE POLACRILEX 4 MG MT LOZG: 4.0000 mg | LOZENGE | OROMUCOSAL | 3 refills | Status: DC | PRN

## 2020-04-29 NOTE — Addendum Note (Signed)
Addended by: Deloria Lair on: 04/29/2020 03:20 PM   Modules accepted: Orders

## 2020-04-29 NOTE — Assessment & Plan Note (Signed)
Reviewed high-resolution CT chest with patient Reviewed pulmonary function testing with patient Patient current smoker with 18-pack-year smoking history  Plan: Lab work today to check alpha-1 Stop Flovent inhaler Trial of Breztri Strongly recommend stopping smoking Nicotine replacement therapies provided today

## 2020-04-29 NOTE — Assessment & Plan Note (Signed)
Ongoing dyspnea on exertion that is slowly resolving Patient with mild emphysema as well as a diffusion defect on pulmonary function testing High-resolution CT chest does not show any ILD This patient status post COVID-19  Plan: Lab work today Continue to clinically monitor Work on stopping smoking Trial of ICS/LABA/LAMA inhaler

## 2020-04-29 NOTE — Assessment & Plan Note (Signed)
Current smoker Smoking 3 cigarettes a day 18-pack-year smoking history Patient actively working on stopping smoking Currently utilizing Wellbutrin Open to trialing nicotine replacement therapies  Plan: Continue Wellbutrin Set quit date Stop smoking Nicotine replacement therapies trialed today

## 2020-04-29 NOTE — Patient Instructions (Addendum)
You were seen today by Coral Ceo, NP  for:   1. DOE (dyspnea on exertion) 2. Other emphysema (HCC)  Lab work today   Trial of Clinical cytogeneticist >>> 2 puffs in the morning right when you wake up, rinse out your mouth after use, 12 hours later 2 puffs, rinse after use >>> Take this daily, no matter what >>> This is not a rescue inhaler   Call our office when you finish the 2 samples that we have provided for you  HOLD Flovent for right now   Only use your albuterol as a rescue medication to be used if you can't catch your breath by resting or doing a relaxed purse lip breathing pattern.  - The less you use it, the better it will work when you need it. - Ok to use up to 2 puffs  every 4 hours if you must but call for immediate appointment if use goes up over your usual need - Don't leave home without it !!  (think of it like the spare tire for your car)    3. History of COVID-19  Work on increasing overall physical activity every day  Start by walking every day 10 to 15 minutes a day and you can add on a minute each day every week  4. Current every day smoker  - nicotine (NICODERM CQ) 14 mg/24hr patch; Place 1 patch (14 mg total) onto the skin daily.  Dispense: 28 patch; Refill: 3 - nicotine polacrilex (COMMIT) 4 MG lozenge; Take 1 lozenge (4 mg total) by mouth as needed.  Dispense: 108 tablet; Refill: 3  We recommend that you stop smoking.  >>>You need to set a quit date >>>If you have friends or family who smoke, let them know you are trying to quit and not to smoke around you or in your living environment  Smoking Cessation Resources:  1 800 QUIT NOW  >>> Patient to call this resource and utilize it to help support her quit smoking >>> Keep up your hard work with stopping smoking  You can also contact the [facility] >>>For smoking cessation classes call [phone]  We do not recommend using e-cigarettes as a form of stopping smoking  You can sign up for  smoking cessation support texts and information:  >>>https://smokefree.gov/smokefreetxt   Nicotine patches: >>>Make sure you rotate sites that you do not get skin irritation, Apply 1 patch each morning to a non-hairy skin site  If you are smoking less than 10 cigarettes a day or weight less than 45 kg start with medium dose pack of 14 mg a day for 6 weeks, followed by 7 mg a day for 2 weeks   >>>If insomnia occurs you are having trouble sleeping you can take the patch off at night, and place a new one on in the morning >>>If the patch is removed at night and you have morning cravings start short acting nicotine replacement therapy such as gum or lozenges  Nicotine Gum:  >>>Smokers who smoke 25 or more cigarettes a day should use 4 mg dose >>>Smokers who smoke fewer than 25 cigarettes a day should use 2 mg dose  Proper chewing of gum is important for optimal results.   >>>Do not chew gum to rapidly.  Once you start chewing eating tasty peppery taste and slide the gum to your cheek.  When the taste disappears to a few more times.  Repeat this for 30 minutes.  Then discard the gum.  >>>Avoid  acidic beverages such as coffee, carbonated beverages before and during gum use.  A soft acidic beverages lower oral pH which cause nicotine to not be absorbed properly >>>If you chew the gum too quickly or vigorously you could have nausea, vomiting, abdominal pain, constipation, hiccups, headache, sore jaw, mouth irritation ulcers  Nicotine lozenge: Lozenges are commonly uses short acting NRT product  >>>Smokers who smoke within 30 minutes of awakening should use 4 mg dose >>>Smokers who wait more than 30 minutes after awakening to smoke should use 2 mg dose  Can use up to 1 lozenge every 1-2 hours for 6 weeks >>>Total amount of lozenges that can be used per day as 20 >>>Gradually reduce number of lozenges used per day after 2 weeks of use  Place lozenge in mouth and allowed to dissolve for 30  minutes loss and does not need to be chewed  Lozenges have advantages to be able to be used in people with TMG, poor dentition, dentures   We recommend today:   Meds ordered this encounter  Medications  . nicotine (NICODERM CQ) 14 mg/24hr patch    Sig: Place 1 patch (14 mg total) onto the skin daily.    Dispense:  28 patch    Refill:  3  . nicotine polacrilex (COMMIT) 4 MG lozenge    Sig: Take 1 lozenge (4 mg total) by mouth as needed.    Dispense:  108 tablet    Refill:  3  . Budeson-Glycopyrrol-Formoterol (BREZTRI AEROSPHERE) 160-9-4.8 MCG/ACT AERO    Sig: Inhale 2 puffs into the lungs in the morning and at bedtime for 1 day.    Dispense:  5.9 g    Refill:  0    Order Specific Question:   Lot Number?    Answer:   [id number]    Order Specific Question:   Expiration Date?    Answer:   07/27/2021    Order Specific Question:   Manufacturer?    Answer:   [employer] [[id number]]    Order Specific Question:   Quantity    Answer:   1  . Budeson-Glycopyrrol-Formoterol (BREZTRI AEROSPHERE) 160-9-4.8 MCG/ACT AERO    Sig: Inhale 2 puffs into the lungs in the morning and at bedtime.    Dispense:  5.9 g    Refill:  0    Order Specific Question:   Lot Number?    Answer:   [id number]    Order Specific Question:   Expiration Date?    Answer:   09/26/2021    Order Specific Question:   Manufacturer?    Answer:   [employer] [[id number]]    Order Specific Question:   Quantity    Answer:   1    Follow Up:    Return in about 3 months (around 07/27/2020), or if symptoms worsen or fail to improve, for Follow up with Dr. Dalbert Mayotte.   Notification of test results are managed in the following manner: If there are  any recommendations or changes to the  plan of care discussed in office today,  we will contact you and let you know what they are. If you do not hear from [place], then your results are normal and you can view them through your  MyChart account , or a letter will be sent to you. Thank you again for trusting  [place] with your care  - Thank you, [facility]    It is flu season:   >>> Best ways to protect herself from the flu: Receive  the yearly flu vaccine, practice good hand hygiene washing with soap and also using hand sanitizer when available, eat a nutritious meals, get adequate rest, hydrate appropriately       Please contact the office if your symptoms worsen or you have concerns that you are not improving.   Thank you for choosing [facility] for your healthcare, and for allowing [place] to partner with you on your healthcare journey. I am thankful to be able to provide care to you today.   Elisha Headland FNP-C    American Journal of Respiratory and Critical Care Medicine, [phone]), e5-e31. FundraisingTactics.gl.628315-1761YW">  Health Risks of Smoking Smoking tobacco is very bad for your health. Tobacco smoke contains many toxic chemicals that can damage every part of your body. Secondhand smoke can be harmful to those around you. Tobacco or nicotine use can cause many long-term (chronic) diseases. Smoking is difficult to quit because a chemical in tobacco, called nicotine, causes addiction or dependence. When you smoke and inhale, nicotine is absorbed quickly into the bloodstream through your lungs. Both inhaled and non-inhaled nicotine may be addictive. How can quitting affect me? There are health benefits of quitting smoking. Some benefits happen right away and others take time. Benefits may include:  Blood flow, blood pressure, heart rate, and lung capacity may begin to improve. However, any lung damage that has already occurred cannot be repaired.  Temporary respiratory symptoms, such as nasal congestion and cough, may improve over time.  Your risk of heart disease, stroke, and cancer is reduced.  The overall quality of your health may improve.  You may save money, as you will not spend money on tobacco products and may spend less money on smoking-related health  issues. What can increase my risk? Smoking harms nearly every organ in the body. People who smoke tobacco have a shorter life expectancy and an increased risk of many serious medical problems. These include:  More respiratory infections, such as colds and pneumonia.  Cancer.  Heart disease.  Stroke.  Chronic respiratory diseases.  Delayed wound healing and increased risk of complications during surgery.  Problems with reproduction, pregnancy, and childbirth, such as infertility, early (premature) births, stillbirths, and birth defects. Secondhand smoke exposure to children increases the risk of:  Sudden infant death syndrome (SIDS).  Infections in the nose, throat, or airways (respiratory infections).  Chronic respiratory symptoms.   What actions can I take to quit? Smoking is an addiction that affects both your body and your mind, and long-time habits can be hard to change. Your health care provider can recommend:  Nicotine replacement products, such as patches, gum, and nasal sprays. Use these products only as directed. Do not replace cigarette smoking with electronic cigarettes, which are commonly called e-cigarettes. The safety of e-cigarettes is not known, and some may contain harmful chemicals.  Programs and community resources, which may include group support, education, or talk therapy.  Prescription medicines to help reduce cravings.  A combination of two or more quit methods, which will increase the success of quitting.   Where to find support Follow the recommendations from your health care provider about support groups and other assistance. You can also visit:  [employer]: www.naquitline.org or call 1-800-QUIT-NOW.  U.S. Department of Health and Human Services: www.smokefree.gov  American Lung Association: www.freedomfromsmoking.org  American Heart Association: www.heart.org Where to find more information  Centers for Disease Control  and Prevention: FootballExhibition.com.br  World Health Organization: https://castaneda-walker.com/ Summary  Smoking tobacco is very bad  for your health. Tobacco smoke contains many toxic chemicals that can damage every part of the body.  Smoking is difficult to quit because a chemical in tobacco, called nicotine, causes addiction or dependence.  There are immediate and long-term health benefits of quitting smoking.  A combination of two or more quit methods increases the success of quitting. This information is not intended to replace advice given to you by your health care provider. Make sure you discuss any questions you have with your health care provider. Document Revised: 04/30/2019 Document Reviewed: 04/30/2019 Elsevier Patient Education  2021 [employer].

## 2020-04-29 NOTE — Progress Notes (Signed)
_0  ID: Joel Carr, male    DOB: March 29, 1986, 35 y.o.   MRN: [id number]  Chief Complaint  Patient presents with  . Follow-up    Review Echo, CT and PFT--c/o sob with exertion.     Referring provider: Marval Regal, NP  HPI:  35 year old male current smoker followed in our office for history of COVID-19 infection  PMH: Hyperlipidemia, restless leg syndrome, GERD, insomnia, anxiety Smoker/ Smoking History: Current smoker.  Smoking 3 cigarettes a day.  18-pack-year smoking history Maintenance: Flovent  Pt of: Dr. Chase Caller  04/29/2020  - Visit   35 year old male current smoker presenting to office today as a follow-up.  Patient is established with Dr. Chase Caller.  Seen initially in November/2021 for a pulmonary consult status post COVID-19 infection.  Patient currently works at a [employer].  Patient tested positive for SARS-CoV-2 in September/2020.  Patient continues to have symptoms of dyspnea on exertion.  Plan of care from November/2021 was as follows: Obtain lab work, high-resolution CT chest, obtain echocardiogram and get full pulmonary function testing.  Patient is presenting today as a follow-up.  03/05/2020-CT chest high-res-no evidence of ILD, no findings of postinflammatory fibrosis, emphysema  03/06/2020-echocardiogram-LV ejection fraction 55 to 60%, left ventricular diastolic parameters were normal, right ventricular systolic function is normal  03/06/2020-pulmonary function testing-FVC 4.11 (81% retention), postbronchodilator ratio 85, postbronchodilator FEV1 3.66 (89% predicted), positive bronchodilator response in FEV1, TLC 4.72 (87% predicted), DLCO 22.01 (73% predicted)  Patient reports that he has been doing well since last being seen.  He is working on decreasing his smoking.  He is down to 3 cigarettes a day.  He has back at work he averages 10-12,000 steps every time he works.  He does not exercise daily besides this.  He still  has aspects of shortness of breath.  He still finds clinical relief when utilizing his Flovent inhaler.  Questionaires / Pulmonary Flowsheets:   ACT:  No flowsheet data found.  MMRC: No flowsheet data found.  Epworth:  No flowsheet data found.  Tests:   FENO:  No results found for: NITRICOXIDE  PFT: PFT Results Latest Ref Rng & Units 03/06/2020  FVC-Predicted Pre % 81  FVC-Post L 4.31  FVC-Predicted Post % 85  Pre FEV1/FVC % % 60  Post FEV1/FCV % % 85  FEV1-Pre L 2.48  FEV1-Predicted Pre % 60  FEV1-Post L 3.66  DLCO uncorrected ml/min/mmHg 22.01  DLCO UNC% % 73  DLVA Predicted % 87  TLC L 5.72  TLC % Predicted % 87  RV % Predicted % 87    WALK:  SIX MIN WALK 02/20/2020  Supplimental Oxygen during Test? (L/min) No  Tech Comments: rapid pace-sob    Imaging: No results found.  Lab Results:  CBC    Component Value Date/Time   WBC 12.2 (H) 02/20/2020 1123   RBC 4.93 02/20/2020 1123   HGB 15.2 02/20/2020 1123   HGB 16.1 08/08/2019 1437   HCT 44.0 02/20/2020 1123   HCT 46.0 08/08/2019 1437   PLT 235 02/20/2020 1123   PLT 219 08/08/2019 1437   MCV 89.2 02/20/2020 1123   MCV 90 08/08/2019 1437   MCV 90 03/12/2014 2333   MCH 30.8 02/20/2020 1123   MCHC 34.5 02/20/2020 1123   RDW 12.2 02/20/2020 1123   RDW 12.8 08/08/2019 1437   RDW 13.3 03/12/2014 2333   LYMPHSABS 2.8 02/20/2020 1123   LYMPHSABS 3.2 (H) 08/08/2019 1437   LYMPHSABS 2.9 12/10/2013 2157  MONOABS 0.4 02/20/2020 1123   MONOABS 0.6 12/10/2013 2157   EOSABS 0.0 02/20/2020 1123   EOSABS 0.1 08/08/2019 1437   EOSABS 0.1 12/10/2013 2157   BASOSABS 0.0 02/20/2020 1123   BASOSABS 0.0 08/08/2019 1437   BASOSABS 0.0 12/10/2013 2157    BMET    Component Value Date/Time   NA 138 01/22/2020 1640   NA 141 08/08/2019 1437   NA 141 03/12/2014 2333   K 3.7 01/22/2020 1640   K 3.5 03/12/2014 2333   CL 102 01/22/2020 1640   CL 109 (H) 03/12/2014 2333   CO2 28 01/22/2020 1640   CO2 25  03/12/2014 2333   GLUCOSE 85 01/22/2020 1640   GLUCOSE 86 03/12/2014 2333   BUN 8 01/22/2020 1640   BUN 9 08/08/2019 1437   BUN 2 (L) 03/12/2014 2333   CREATININE 0.82 01/22/2020 1640   CREATININE 0.64 03/12/2014 2333   CALCIUM 9.6 01/22/2020 1640   CALCIUM 8.6 03/12/2014 2333   GFRNONAA 113 08/08/2019 1437   GFRNONAA >60 03/12/2014 2333   GFRNONAA >60 12/10/2013 2157   GFRAA 131 08/08/2019 1437   GFRAA >60 03/12/2014 2333   GFRAA >60 12/10/2013 2157    BNP No results found for: BNP  ProBNP No results found for: PROBNP  Specialty Problems      Pulmonary Problems   Acute non-recurrent maxillary sinusitis   DOE (dyspnea on exertion)   Other emphysema (HCC)      Allergies  Allergen Reactions  . Tramadol     Jittery    Immunization History  Administered Date(s) Administered  . DTaP 11/11/1987  . Hepatitis B 01/09/1996, 02/13/1996, 07/23/1996  . IPV 11/11/1987  . Influenza,inj,Quad PF,6+ Mos 01/31/2017  . Influenza-Unspecified 11/27/2014, 12/28/2015, 12/28/2017  . MMR 11/11/1987  . Moderna Sars-Covid-2 Vaccination 02/10/2020  . Td 09/04/2009    Past Medical History:  Diagnosis Date  . Bipolar disorder ([place])   . COVID-19 virus infection 12/23/2019   mild congestion and body aches  . Depression   . Hyperlipidemia   . RLS (restless legs syndrome)     Tobacco History: Social History   Tobacco Use  Smoking Status Current Every Day Smoker  . Packs/day: 1.00  . Years: 18.00  . Pack years: 18.00  . Types: Cigarettes  Smokeless Tobacco Never Used  Tobacco Comment   3 cig daily-04/29/2020   Ready to quit: Not Answered Counseling given: Not Answered Comment: 3 cig daily-04/29/2020  Smoking assessment and cessation counseling  Patient currently smoking: 3 cigarettes a day I have advised the patient to quit/stop smoking as soon as possible due to high risk for multiple medical problems.  It will also be very difficult for [place] to manage patient's   respiratory symptoms and status if we continue to expose her lungs to a known irritant.  We do not advise e-cigarettes as a form of stopping smoking.  Patient is willing to quit smoking.  Has not set quit date.  Patient willing to trial nicotine replacement therapies.  Patient would like to remain on Wellbutrin  I have advised the patient that we can assist and have options of nicotine replacement therapy, provided smoking cessation education today, provided smoking cessation counseling, and provided cessation resources.  Follow-up next office visit office visit for assessment of smoking cessation.    Smoking cessation counseling advised for: 5 minutes    Outpatient Encounter Medications as of 04/29/2020  Medication Sig  . albuterol (VENTOLIN HFA) 108 (90 Base) MCG/ACT inhaler Inhale 2 puffs  into the lungs every 6 (six) hours as needed for wheezing or shortness of breath.  . Budeson-Glycopyrrol-Formoterol (BREZTRI AEROSPHERE) 160-9-4.8 MCG/ACT AERO Inhale 2 puffs into the lungs in the morning and at bedtime for 1 day.  . Budeson-Glycopyrrol-Formoterol (BREZTRI AEROSPHERE) 160-9-4.8 MCG/ACT AERO Inhale 2 puffs into the lungs in the morning and at bedtime.  Marland Kitchen buPROPion (WELLBUTRIN SR) 150 MG 12 hr tablet Take 150 mg by mouth 2 (two) times daily.  . clonazePAM (KLONOPIN) 0.5 MG tablet Take 1 tablet (0.5 mg total) by mouth daily as needed for anxiety.  . fluticasone (FLOVENT HFA) 110 MCG/ACT inhaler Inhale 2 puffs into the lungs 2 (two) times daily.  Marland Kitchen lamoTRIgine (LAMICTAL) 100 MG tablet Take 1 tablet (100 mg total) by mouth 2 (two) times daily.  Marland Kitchen lurasidone (LATUDA) 40 MG TABS tablet Take 1 tablet (40 mg total) by mouth daily with breakfast.  . nicotine (NICODERM CQ) 14 mg/24hr patch Place 1 patch (14 mg total) onto the skin daily.  . nicotine polacrilex (COMMIT) 4 MG lozenge Take 1 lozenge (4 mg total) by mouth as needed.  Marland Kitchen QUEtiapine (SEROQUEL) 25 MG tablet Take 25 mg by mouth daily as  needed.  . [DISCONTINUED] gabapentin (NEURONTIN) 300 MG capsule Take 2 capsules (600 mg total) by mouth 2 (two) times daily as needed.  . cyclobenzaprine (FLEXERIL) 10 MG tablet Take 1 tablet (10 mg total) by mouth at bedtime as needed for muscle spasms. (Patient not taking: Reported on 04/29/2020)   No facility-administered encounter medications on file as of 04/29/2020.     Review of Systems  Review of Systems  Constitutional: Negative for activity change, chills, fatigue, fever and unexpected weight change.  HENT: Negative for congestion, postnasal drip, rhinorrhea, sinus pressure, sinus pain and sore throat.   Eyes: Negative.   Respiratory: Positive for cough (dry ) and shortness of breath. Negative for wheezing.   Cardiovascular: Negative for chest pain and palpitations.  Gastrointestinal: Negative for constipation, diarrhea, nausea and vomiting.  Endocrine: Negative.   Genitourinary: Negative.   Musculoskeletal: Negative.   Skin: Negative.   Neurological: Negative for dizziness and headaches.  Psychiatric/Behavioral: Negative.  Negative for dysphoric mood. The patient is not nervous/anxious.   All other systems reviewed and are negative.    Physical Exam  BP 124/76 (BP Location: Left Arm, Cuff Size: Normal)   Pulse 68   Temp 98.2 F (36.8 C) (Temporal)   Ht 5' 8" (1.727 m)   Wt 168 lb (76.2 kg)   SpO2 98%   BMI 25.54 kg/m   Wt Readings from Last 5 Encounters:  04/29/20 168 lb (76.2 kg)  02/20/20 168 lb 9.6 oz (76.5 kg)  01/22/20 173 lb (78.5 kg)  01/11/20 158 lb (71.7 kg)  10/24/19 158 lb 15.2 oz (72.1 kg)    BMI Readings from Last 5 Encounters:  04/29/20 25.54 kg/m  02/20/20 25.64 kg/m  01/22/20 26.30 kg/m  01/11/20 24.02 kg/m  10/24/19 24.17 kg/m     Physical Exam Vitals and nursing note reviewed.  Constitutional:      General: He is not in acute distress.    Appearance: Normal appearance. He is normal weight.  HENT:     Head: Normocephalic and  atraumatic.     Right Ear: Hearing and external ear normal.     Left Ear: Hearing and external ear normal.     Nose: Nose normal. No mucosal edema or rhinorrhea.     Right Turbinates: Not enlarged.  Left Turbinates: Not enlarged.     Mouth/Throat:     Mouth: Mucous membranes are dry.     Pharynx: Oropharynx is clear. No oropharyngeal exudate.  Eyes:     Pupils: Pupils are equal, round, and reactive to light.  Cardiovascular:     Rate and Rhythm: Normal rate and regular rhythm.     Pulses: Normal pulses.     Heart sounds: Normal heart sounds. No murmur heard.   Pulmonary:     Effort: Pulmonary effort is normal.     Breath sounds: Normal breath sounds. No decreased breath sounds, wheezing or rales.  Musculoskeletal:     Cervical back: Normal range of motion.     Right lower leg: No edema.     Left lower leg: No edema.  Lymphadenopathy:     Cervical: No cervical adenopathy.  Skin:    General: Skin is warm and dry.     Capillary Refill: Capillary refill takes less than 2 seconds.     Findings: No erythema or rash.  Neurological:     General: No focal deficit present.     Mental Status: He is alert and oriented to person, place, and time.     Motor: No weakness.     Coordination: Coordination normal.     Gait: Gait is intact. Gait normal.  Psychiatric:        Mood and Affect: Mood normal.        Behavior: Behavior normal. Behavior is cooperative.        Thought Content: Thought content normal.        Judgment: Judgment normal.       Assessment & Plan:   Other emphysema ([place]) Reviewed high-resolution CT chest with patient Reviewed pulmonary function testing with patient Patient current smoker with 18-pack-year smoking history  Plan: Lab work today to check alpha-1 Stop Flovent inhaler Trial of Breztri Strongly recommend stopping smoking Nicotine replacement therapies provided today  Current every day smoker Current smoker Smoking 3 cigarettes a  day 18-pack-year smoking history Patient actively working on stopping smoking Currently utilizing Wellbutrin Open to trialing nicotine replacement therapies  Plan: Continue Wellbutrin Set quit date Stop smoking Nicotine replacement therapies trialed today  DOE (dyspnea on exertion) Ongoing dyspnea on exertion that is slowly resolving Patient with mild emphysema as well as a diffusion defect on pulmonary function testing High-resolution CT chest does not show any ILD This patient status post COVID-19  Plan: Lab work today Continue to clinically monitor Work on stopping smoking Trial of ICS/LABA/LAMA inhaler  History of COVID-19 Reviewed high-resolution CT chest Reviewed echo Reviewed PFTs  Plan: Continue to clinically monitor    Return in about 3 months (around 07/27/2020), or if symptoms worsen or fail to improve, for Follow up with Dr. Purnell Shoemaker.   Lauraine Rinne, NP 04/29/2020   This appointment required 32 minutes of patient care (this includes precharting, chart review, review of results, face-to-face care, etc.).

## 2020-04-29 NOTE — Assessment & Plan Note (Signed)
Reviewed high-resolution CT chest Reviewed echo Reviewed PFTs  Plan: Continue to clinically monitor

## 2020-05-01 LAB — ALPHA-1 ANTITRYPSIN PHENOTYPE: A-1 Antitrypsin, Ser: 152 mg/dL (ref 95–164)

## 2020-05-19 NOTE — Telephone Encounter (Signed)
Per [name]- recommend 4mg  lozenge. Scheduled OV with our office or [facility] to discuss further.

## 2020-05-19 NOTE — Telephone Encounter (Signed)
Patient is requesting stronger nicotine patches. He has starting smoking again.   Elisha Headland, NP, please advise. Thanks

## 2020-05-23 ENCOUNTER — Encounter: Payer: Self-pay | Admitting: Nurse Practitioner

## 2020-07-22 ENCOUNTER — Encounter: Payer: Self-pay | Admitting: Internal Medicine

## 2020-07-22 ENCOUNTER — Ambulatory Visit: Payer: Managed Care, Other (non HMO) | Admitting: Internal Medicine

## 2020-07-22 ENCOUNTER — Other Ambulatory Visit: Payer: Self-pay

## 2020-07-22 ENCOUNTER — Telehealth: Payer: Self-pay | Admitting: Internal Medicine

## 2020-07-22 VITALS — BP 111/71 | HR 69 | Temp 97.9°F | Ht 67.87 in | Wt 169.8 lb

## 2020-07-22 DIAGNOSIS — Z13220 Encounter for screening for lipoid disorders: Secondary | ICD-10-CM | POA: Diagnosis not present

## 2020-07-22 DIAGNOSIS — G43809 Other migraine, not intractable, without status migrainosus: Secondary | ICD-10-CM

## 2020-07-22 DIAGNOSIS — F3131 Bipolar disorder, current episode depressed, mild: Secondary | ICD-10-CM

## 2020-07-22 DIAGNOSIS — Z131 Encounter for screening for diabetes mellitus: Secondary | ICD-10-CM

## 2020-07-22 DIAGNOSIS — Z1329 Encounter for screening for other suspected endocrine disorder: Secondary | ICD-10-CM | POA: Diagnosis not present

## 2020-07-22 MED ORDER — VERAPAMIL HCL ER 120 MG PO TBCR: 120.0000 mg | EXTENDED_RELEASE_TABLET | Freq: Every day | ORAL | 2 refills | Status: DC

## 2020-07-22 MED ORDER — SUMATRIPTAN SUCCINATE 25 MG PO TABS: 25.0000 mg | ORAL_TABLET | ORAL | 2 refills | Status: DC | PRN

## 2020-07-22 NOTE — Telephone Encounter (Signed)
Needs medication clarification, please call back   SUMAtriptan (IMITREX) 25 MG tablet MEDICAL [address] [place], [place] - [address]  [address] [place] [address]  Phone: [phone] Fax: [phone]   Speak to anyone

## 2020-07-22 NOTE — Progress Notes (Signed)
BP 111/71   Pulse 69   Temp 97.9 F (36.6 C) (Oral)   Ht 5' 7.87" (1.724 m)   Wt 169 lb 12.8 oz (77 kg)   SpO2 97%   BMI 25.91 kg/m    Subjective:    Patient ID: Joel Carr, male    DOB: September 20, 1985, 36 y.o.   MRN: [id number]  HPI: Joel Carr is a 35 y.o. male  Pt is here to establish care he was seen by dr Dossie Arbour about 1 yr ago. He had COVID in sept 2021 and has had coughing/ SOBOE sees pulmonology is on breztri for such - see [facility]  Has a ho BPD is on lamicatl/ latuda/ seroquel and wellbutrin. Is stable.  Works in [employer]   Migraine  This is a chronic (once or twice a week, has been going on for a year ) problem. Pertinent negatives include no abdominal pain, coughing, dizziness, fever, nausea, numbness or weakness.  Shortness of Breath Pertinent negatives include no abdominal pain, chest pain, fever, headaches, leg swelling, rash or wheezing.    Chief Complaint  Patient presents with  . New Patient (Initial Visit)  . Migraine    Relevant past medical, surgical, family and social history reviewed and updated as indicated. Interim medical history since our last visit reviewed. Allergies and medications reviewed and updated.  Review of Systems  Constitutional: Negative for activity change, appetite change, chills, fatigue and fever.  HENT: Negative for congestion.   Eyes: Negative for visual disturbance.  Respiratory: Positive for shortness of breath. Negative for apnea, cough, choking, chest tightness and wheezing.   Cardiovascular: Negative for chest pain, palpitations and leg swelling.  Gastrointestinal: Negative for abdominal distention, abdominal pain, diarrhea and nausea.  Endocrine: Negative for cold intolerance, heat intolerance, polydipsia, polyphagia and polyuria.  Genitourinary: Negative for difficulty urinating, dysuria, frequency, hematuria and urgency.  Skin: Negative for color change and rash.   Neurological: Negative for dizziness, speech difficulty, weakness, light-headedness, numbness and headaches.  Psychiatric/Behavioral: Negative for behavioral problems and confusion. The patient is not nervous/anxious.     Per HPI unless specifically indicated above     Objective:    BP 111/71   Pulse 69   Temp 97.9 F (36.6 C) (Oral)   Ht 5' 7.87" (1.724 m)   Wt 169 lb 12.8 oz (77 kg)   SpO2 97%   BMI 25.91 kg/m   Wt Readings from Last 3 Encounters:  07/22/20 169 lb 12.8 oz (77 kg)  04/29/20 168 lb (76.2 kg)  02/20/20 168 lb 9.6 oz (76.5 kg)    Physical Exam Vitals and nursing note reviewed.  Constitutional:      General: He is not in acute distress.    Appearance: Normal appearance. He is not ill-appearing or diaphoretic.  HENT:     Head: Normocephalic and atraumatic.     Right Ear: Tympanic membrane and external ear normal. There is no impacted cerumen.     Left Ear: External ear normal.     Nose: No congestion or rhinorrhea.     Mouth/Throat:     Pharynx: No oropharyngeal exudate or posterior oropharyngeal erythema.  Eyes:     Conjunctiva/sclera: Conjunctivae normal.     Pupils: Pupils are equal, round, and reactive to light.  Cardiovascular:     Rate and Rhythm: Normal rate and regular rhythm.     Heart sounds: No murmur heard. No friction rub. No gallop.   Pulmonary:  Effort: No respiratory distress.     Breath sounds: No stridor. No wheezing or rhonchi.  Chest:     Chest wall: No tenderness.  Abdominal:     General: Abdomen is flat. Bowel sounds are normal.     Palpations: Abdomen is soft. There is no mass.     Tenderness: There is no abdominal tenderness.  Musculoskeletal:     Cervical back: Normal range of motion and neck supple. No rigidity or tenderness.     Left lower leg: No edema.  Skin:    General: Skin is warm and dry.  Neurological:     Mental Status: He is alert.     Results for orders placed or performed during the hospital encounter  of 04/29/20  Alpha-1 antitrypsin phenotype  Result Value Ref Range   A-1 Antitrypsin Pheno MM    A-1 Antitrypsin, Ser 152 95 - 164 mg/dL        Current Outpatient Medications:  .  albuterol (VENTOLIN HFA) 108 (90 Base) MCG/ACT inhaler, Inhale 2 puffs into the lungs every 6 (six) hours as needed for wheezing or shortness of breath., Disp: 18 g, Rfl: 0 .  Budeson-Glycopyrrol-Formoterol (BREZTRI AEROSPHERE) 160-9-4.8 MCG/ACT AERO, Inhale 2 puffs into the lungs in the morning and at bedtime., Disp: 5.9 g, Rfl: 0 .  buPROPion (WELLBUTRIN SR) 150 MG 12 hr tablet, Take 150 mg by mouth 2 (two) times daily., Disp: , Rfl:  .  clonazePAM (KLONOPIN) 0.5 MG tablet, Take 1 tablet (0.5 mg total) by mouth daily as needed for anxiety., Disp: 30 tablet, Rfl: 0 .  lamoTRIgine (LAMICTAL) 100 MG tablet, Take 1 tablet (100 mg total) by mouth 2 (two) times daily., Disp: 180 tablet, Rfl: 0 .  lurasidone (LATUDA) 40 MG TABS tablet, Take 1 tablet (40 mg total) by mouth daily with breakfast., Disp: 90 tablet, Rfl: 1 .  nicotine (NICODERM CQ) 14 mg/24hr patch, Place 1 patch (14 mg total) onto the skin daily., Disp: 28 patch, Rfl: 3 .  nicotine polacrilex (COMMIT) 4 MG lozenge, Take 1 lozenge (4 mg total) by mouth as needed., Disp: 108 tablet, Rfl: 3 .  QUEtiapine (SEROQUEL) 25 MG tablet, Take 25 mg by mouth daily as needed., Disp: , Rfl:     Assessment & Plan:  1. Migraine: Start pt on imitrex. start pt on verapamil 120 mg consider increasing dose if recurrent attacks persist.    2. BPD:  fu n mx per psych  Continue meds as rx   3. Post COVID cough ? COPD  Fu with me x 3 - 4 weeks with labs.  CBC, CMP, FLP, HBA1C, TSH   Problem List Items Addressed This Visit   None      Follow up plan: No follow-ups on file.  Health Maintenance  Smoking : current - 4 - 5 / day trying to quit, used to smoke 1 ppd x 13 yrs.  Using welbutrin for such now.

## 2020-07-25 NOTE — Telephone Encounter (Signed)
Dawn is calling for clarfication on the sumatriptan Please advise Cb- [phone]

## 2020-07-25 NOTE — Telephone Encounter (Signed)
Looks like directions are not clear

## 2020-07-28 ENCOUNTER — Encounter: Payer: Self-pay | Admitting: Internal Medicine

## 2020-07-28 NOTE — Telephone Encounter (Signed)
I think that I sent this over to you on Friday

## 2020-07-28 NOTE — Telephone Encounter (Signed)
Pharmacy given instructions per [place]

## 2020-08-08 ENCOUNTER — Other Ambulatory Visit: Payer: Self-pay

## 2020-08-08 ENCOUNTER — Encounter: Payer: Self-pay | Admitting: Internal Medicine

## 2020-08-08 ENCOUNTER — Other Ambulatory Visit: Payer: Managed Care, Other (non HMO)

## 2020-08-08 DIAGNOSIS — F3131 Bipolar disorder, current episode depressed, mild: Secondary | ICD-10-CM

## 2020-08-08 DIAGNOSIS — Z1329 Encounter for screening for other suspected endocrine disorder: Secondary | ICD-10-CM

## 2020-08-08 DIAGNOSIS — Z1152 Encounter for screening for COVID-19: Secondary | ICD-10-CM

## 2020-08-08 DIAGNOSIS — Z131 Encounter for screening for diabetes mellitus: Secondary | ICD-10-CM

## 2020-08-08 LAB — BAYER DCA HB A1C WAIVED: HB A1C (BAYER DCA - WAIVED): 4.9 % (ref <7.0)

## 2020-08-09 LAB — COMPREHENSIVE METABOLIC PANEL WITH GFR
ALT: 10 IU/L (ref 0–44)
AST: 14 IU/L (ref 0–40)
Albumin/Globulin Ratio: 2.6 — ABNORMAL HIGH (ref 1.2–2.2)
Albumin: 4.7 g/dL (ref 4.0–5.0)
Alkaline Phosphatase: 69 IU/L (ref 44–121)
BUN/Creatinine Ratio: 14 (ref 9–20)
BUN: 14 mg/dL (ref 6–20)
Bilirubin Total: 0.4 mg/dL (ref 0.0–1.2)
CO2: 21 mmol/L (ref 20–29)
Calcium: 9.6 mg/dL (ref 8.7–10.2)
Chloride: 101 mmol/L (ref 96–106)
Creatinine, Ser: 1.01 mg/dL (ref 0.76–1.27)
Globulin, Total: 1.8 g/dL (ref 1.5–4.5)
Glucose: 129 mg/dL — ABNORMAL HIGH (ref 65–99)
Potassium: 4.2 mmol/L (ref 3.5–5.2)
Sodium: 139 mmol/L (ref 134–144)
Total Protein: 6.5 g/dL (ref 6.0–8.5)
eGFR: 99 mL/min/1.73

## 2020-08-09 LAB — CBC WITH DIFFERENTIAL/PLATELET
Basophils Absolute: 0 10*3/uL (ref 0.0–0.2)
Basos: 0 %
EOS (ABSOLUTE): 0.1 10*3/uL (ref 0.0–0.4)
Eos: 2 %
Hematocrit: 47.3 % (ref 37.5–51.0)
Hemoglobin: 16.2 g/dL (ref 13.0–17.7)
Immature Grans (Abs): 0 10*3/uL (ref 0.0–0.1)
Immature Granulocytes: 0 %
Lymphocytes Absolute: 2.7 10*3/uL (ref 0.7–3.1)
Lymphs: 37 %
MCH: 31.2 pg (ref 26.6–33.0)
MCHC: 34.2 g/dL (ref 31.5–35.7)
MCV: 91 fL (ref 79–97)
Monocytes Absolute: 0.4 10*3/uL (ref 0.1–0.9)
Monocytes: 6 %
Neutrophils Absolute: 4 10*3/uL (ref 1.4–7.0)
Neutrophils: 55 %
Platelets: 184 10*3/uL (ref 150–450)
RBC: 5.2 x10E6/uL (ref 4.14–5.80)
RDW: 12.7 % (ref 11.6–15.4)
WBC: 7.3 10*3/uL (ref 3.4–10.8)

## 2020-08-09 LAB — THYROID PANEL WITH TSH
Free Thyroxine Index: 2.2 (ref 1.2–4.9)
T3 Uptake Ratio: 31 % (ref 24–39)
T4, Total: 7.1 ug/dL (ref 4.5–12.0)
TSH: 1.32 u[IU]/mL (ref 0.450–4.500)

## 2020-08-15 ENCOUNTER — Telehealth (INDEPENDENT_AMBULATORY_CARE_PROVIDER_SITE_OTHER): Payer: Managed Care, Other (non HMO) | Admitting: Internal Medicine

## 2020-08-15 ENCOUNTER — Telehealth: Payer: Self-pay

## 2020-08-15 ENCOUNTER — Encounter: Payer: Self-pay | Admitting: Internal Medicine

## 2020-08-15 ENCOUNTER — Other Ambulatory Visit: Payer: Self-pay

## 2020-08-15 DIAGNOSIS — R7989 Other specified abnormal findings of blood chemistry: Secondary | ICD-10-CM | POA: Diagnosis not present

## 2020-08-15 DIAGNOSIS — J438 Other emphysema: Secondary | ICD-10-CM | POA: Diagnosis not present

## 2020-08-15 MED ORDER — FEXOFENADINE HCL 180 MG PO TABS: 180.0000 mg | ORAL_TABLET | Freq: Every day | ORAL | 0 refills | Status: DC

## 2020-08-15 MED ORDER — NICOTINE 21 MG/24HR TD PT24: 21.0000 mg | MEDICATED_PATCH | Freq: Every day | TRANSDERMAL | 0 refills | Status: DC

## 2020-08-15 MED ORDER — AMOXICILLIN-POT CLAVULANATE 500-125 MG PO TABS: 1.0000 | ORAL_TABLET | Freq: Two times a day (BID) | ORAL | 0 refills | Status: AC

## 2020-08-15 NOTE — Telephone Encounter (Signed)
needs appt in 3 months - fu on migraines / labs 1 week prior please scheudle for a fu in office thnx.

## 2020-08-15 NOTE — Progress Notes (Signed)
There were no vitals taken for this visit.   Subjective:    Patient ID: Joel Carr, male    DOB: 12/29/1985, 35 y.o.   MRN: [id number]  HPI: Joel Carr is a 35 y.o. male  begininning of the week had a fever - temp of about 100.7 F COVID home test  -ve  Sinus Problem This is a new (green coloured phelgm x 3 days now. ) problem. The current episode started in the past 7 days. There has been no fever. The pain is moderate. Associated symptoms include congestion, coughing, headaches and sinus pressure. Pertinent negatives include no chills, diaphoresis, hoarse voice, neck pain, shortness of breath, sneezing, sore throat or swollen glands.    Chief Complaint  Patient presents with  . review Lab results  . Nasal Congestion    Started on Monday, has had a runny nose and scratchy throat.  . Gastroesophageal Reflux  . Manic Behavior    Relevant past medical, surgical, family and social history reviewed and updated as indicated. Interim medical history since our last visit reviewed. Allergies and medications reviewed and updated.  Review of Systems  Constitutional: Negative for chills and diaphoresis.  HENT: Positive for congestion and sinus pressure. Negative for hoarse voice, sneezing and sore throat.   Respiratory: Positive for cough. Negative for shortness of breath.   Musculoskeletal: Negative for neck pain.  Neurological: Positive for headaches.    Per HPI unless specifically indicated above     Objective:    There were no vitals taken for this visit.  Wt Readings from Last 3 Encounters:  07/22/20 169 lb 12.8 oz (77 kg)  04/29/20 168 lb (76.2 kg)  02/20/20 168 lb 9.6 oz (76.5 kg)    Physical Exam Vitals and nursing note reviewed.  Constitutional:      General: He is not in acute distress.    Appearance: He is not ill-appearing or diaphoretic.  Psychiatric:        Mood and Affect: Mood normal.        Behavior: Behavior normal.        Thought Content: Thought  content normal.     Results for orders placed or performed in visit on 08/08/20  Comprehensive metabolic panel  Result Value Ref Range   Glucose 129 (H) 65 - 99 mg/dL   BUN 14 6 - 20 mg/dL   Creatinine, Ser 1.01 0.76 - 1.27 mg/dL   eGFR 99 >59 mL/min/1.73   BUN/Creatinine Ratio 14 9 - 20   Sodium 139 134 - 144 mmol/L   Potassium 4.2 3.5 - 5.2 mmol/L   Chloride 101 96 - 106 mmol/L   CO2 21 20 - 29 mmol/L   Calcium 9.6 8.7 - 10.2 mg/dL   Total Protein 6.5 6.0 - 8.5 g/dL   Albumin 4.7 4.0 - 5.0 g/dL   Globulin, Total 1.8 1.5 - 4.5 g/dL   Albumin/Globulin Ratio 2.6 (H) 1.2 - 2.2   Bilirubin Total 0.4 0.0 - 1.2 mg/dL   Alkaline Phosphatase 69 44 - 121 IU/L   AST 14 0 - 40 IU/L   ALT 10 0 - 44 IU/L  CBC with Differential/Platelet  Result Value Ref Range   WBC 7.3 3.4 - 10.8 x10E3/uL   RBC 5.20 4.14 - 5.80 x10E6/uL   Hemoglobin 16.2 13.0 - 17.7 g/dL   Hematocrit 47.3 37.5 - 51.0 %   MCV 91 79 - 97 fL   MCH 31.2 26.6 - 33.0 pg   MCHC  34.2 31.5 - 35.7 g/dL   RDW 12.7 11.6 - 15.4 %   Platelets 184 150 - 450 x10E3/uL   Neutrophils 55 Not Estab. %   Lymphs 37 Not Estab. %   Monocytes 6 Not Estab. %   Eos 2 Not Estab. %   Basos 0 Not Estab. %   Neutrophils Absolute 4.0 1.4 - 7.0 x10E3/uL   Lymphocytes Absolute 2.7 0.7 - 3.1 x10E3/uL   Monocytes Absolute 0.4 0.1 - 0.9 x10E3/uL   EOS (ABSOLUTE) 0.1 0.0 - 0.4 x10E3/uL   Basophils Absolute 0.0 0.0 - 0.2 x10E3/uL   Immature Granulocytes 0 Not Estab. %   Immature Grans (Abs) 0.0 0.0 - 0.1 x10E3/uL  Thyroid Panel With TSH  Result Value Ref Range   TSH 1.320 0.450 - 4.500 uIU/mL   T4, Total 7.1 4.5 - 12.0 ug/dL   T3 Uptake Ratio 31 24 - 39 %   Free Thyroxine Index 2.2 1.2 - 4.9  Bayer DCA Hb A1c Waived  Result Value Ref Range   HB A1C (BAYER DCA - WAIVED) 4.9 <7.0 %        Current Outpatient Medications:  .  albuterol (VENTOLIN HFA) 108 (90 Base) MCG/ACT inhaler, Inhale 2 puffs into the lungs every 6 (six) hours as needed for  wheezing or shortness of breath., Disp: 18 g, Rfl: 0 .  Budeson-Glycopyrrol-Formoterol (BREZTRI AEROSPHERE) 160-9-4.8 MCG/ACT AERO, Inhale 2 puffs into the lungs in the morning and at bedtime., Disp: 5.9 g, Rfl: 0 .  buPROPion (WELLBUTRIN SR) 150 MG 12 hr tablet, Take 150 mg by mouth 2 (two) times daily., Disp: , Rfl:  .  clonazePAM (KLONOPIN) 0.5 MG tablet, Take 1 tablet (0.5 mg total) by mouth daily as needed for anxiety., Disp: 30 tablet, Rfl: 0 .  lamoTRIgine (LAMICTAL) 100 MG tablet, Take 1 tablet (100 mg total) by mouth 2 (two) times daily., Disp: 180 tablet, Rfl: 0 .  lurasidone (LATUDA) 40 MG TABS tablet, Take 1 tablet (40 mg total) by mouth daily with breakfast., Disp: 90 tablet, Rfl: 1 .  nicotine (NICODERM CQ) 14 mg/24hr patch, Place 1 patch (14 mg total) onto the skin daily., Disp: 28 patch, Rfl: 3 .  nicotine polacrilex (COMMIT) 4 MG lozenge, Take 1 lozenge (4 mg total) by mouth as needed., Disp: 108 tablet, Rfl: 3 .  QUEtiapine (SEROQUEL) 25 MG tablet, Take 25 mg by mouth daily as needed., Disp: , Rfl:  .  SUMAtriptan (IMITREX) 25 MG tablet, Take 1 tablet (25 mg total) by mouth every 2 (two) hours as needed for migraine (NOT TO EXCEED 100 MG). May repeat in 2 hours if headache persists or recurs., Disp: 10 tablet, Rfl: 2 .  verapamil (CALAN-SR) 120 MG CR tablet, Take 1 tablet (120 mg total) by mouth at bedtime., Disp: 30 tablet, Rfl: 2    Assessment & Plan:  1. Acute sinusitis:  start pt on augmentin and allegra for such   No orders of the defined types were placed in this encounter.  2. Smoking cessation nicoderm patches sent to [facility] Smoking cessation advised. failed nicotine patches in the past. continues to smoke. more than > 5 - 10 mins of time was spent with pt regarding smoking cessation and complications.  3. Migraines - given verapamil for such feels much better.stable.     Problem List Items Addressed This Visit      Respiratory   Other emphysema ([place])    Relevant Medications   fexofenadine (ALLEGRA ALLERGY) 180 MG tablet  nicotine (NICODERM CQ) 21 mg/24hr patch   Other Relevant Orders   Comp. Metabolic Panel (12)   CBC With Differential    Other Visit Diagnoses    Abnormal CBC    -  Primary   Relevant Orders   Comp. Metabolic Panel (12)   CBC With Differential       Follow up plan: Return in about 3 months (around 11/15/2020).

## 2020-08-19 ENCOUNTER — Encounter: Payer: Self-pay | Admitting: Internal Medicine

## 2020-08-19 NOTE — Telephone Encounter (Signed)
Pt Schuled for apt

## 2020-08-20 ENCOUNTER — Emergency Department
Admission: EM | Admit: 2020-08-20 | Discharge: 2020-08-20 | Disposition: A | Discharge status: Home / Self Care | Payer: Managed Care, Other (non HMO) | Attending: Emergency Medicine | Admitting: Emergency Medicine

## 2020-08-20 ENCOUNTER — Encounter: Payer: Self-pay | Admitting: Emergency Medicine

## 2020-08-20 ENCOUNTER — Other Ambulatory Visit: Payer: Self-pay

## 2020-08-20 DIAGNOSIS — Z7951 Long term (current) use of inhaled steroids: Secondary | ICD-10-CM | POA: Diagnosis not present

## 2020-08-20 DIAGNOSIS — Z8616 Personal history of COVID-19: Secondary | ICD-10-CM | POA: Insufficient documentation

## 2020-08-20 DIAGNOSIS — R4584 Anhedonia: Secondary | ICD-10-CM | POA: Diagnosis present

## 2020-08-20 DIAGNOSIS — J45909 Unspecified asthma, uncomplicated: Secondary | ICD-10-CM | POA: Diagnosis not present

## 2020-08-20 DIAGNOSIS — F3131 Bipolar disorder, current episode depressed, mild: Secondary | ICD-10-CM | POA: Diagnosis not present

## 2020-08-20 DIAGNOSIS — R45851 Suicidal ideations: Secondary | ICD-10-CM | POA: Diagnosis not present

## 2020-08-20 DIAGNOSIS — F319 Bipolar disorder, unspecified: Secondary | ICD-10-CM | POA: Diagnosis not present

## 2020-08-20 DIAGNOSIS — F17210 Nicotine dependence, cigarettes, uncomplicated: Secondary | ICD-10-CM | POA: Insufficient documentation

## 2020-08-20 LAB — COMPREHENSIVE METABOLIC PANEL
ALT: 13 U/L (ref 0–44)
AST: 18 U/L (ref 15–41)
Albumin: 4.5 g/dL (ref 3.5–5.0)
Alkaline Phosphatase: 54 U/L (ref 38–126)
Anion gap: 9 (ref 5–15)
BUN: 9 mg/dL (ref 6–20)
CO2: 25 mmol/L (ref 22–32)
Calcium: 9.4 mg/dL (ref 8.9–10.3)
Chloride: 106 mmol/L (ref 98–111)
Creatinine, Ser: 0.8 mg/dL (ref 0.61–1.24)
GFR, Estimated: >60 mL/min (ref >60)
Glucose, Bld: 104 mg/dL — ABNORMAL HIGH (ref 70–99)
Potassium: 3.8 mmol/L (ref 3.5–5.1)
Sodium: 140 mmol/L (ref 135–145)
Total Bilirubin: 0.5 mg/dL (ref 0.3–1.2)
Total Protein: 7 g/dL (ref 6.5–8.1)

## 2020-08-20 LAB — CBC
HCT: 43.4 % (ref 39.0–52.0)
Hemoglobin: 15.3 g/dL (ref 13.0–17.0)
MCH: 31.2 pg (ref 26.0–34.0)
MCHC: 35.3 g/dL (ref 30.0–36.0)
MCV: 88.4 fL (ref 80.0–100.0)
Platelets: 183 10*3/uL (ref 150–400)
RBC: 4.91 MIL/uL (ref 4.22–5.81)
RDW: 12 % (ref 11.5–15.5)
WBC: 7.8 10*3/uL (ref 4.0–10.5)
nRBC: 0 % (ref 0.0–0.2)

## 2020-08-20 LAB — URINE DRUG SCREEN, QUALITATIVE (ARMC ONLY)
Amphetamines, Ur Screen: NOT DETECTED
Barbiturates, Ur Screen: NOT DETECTED
Benzodiazepine, Ur Scrn: NOT DETECTED
Cannabinoid 50 Ng, Ur ~~LOC~~: NOT DETECTED
Cocaine Metabolite,Ur ~~LOC~~: NOT DETECTED
MDMA (Ecstasy)Ur Screen: NOT DETECTED
Methadone Scn, Ur: NOT DETECTED
Opiate, Ur Screen: NOT DETECTED
Phencyclidine (PCP) Ur S: NOT DETECTED
Tricyclic, Ur Screen: NOT DETECTED

## 2020-08-20 LAB — SALICYLATE LEVEL: Salicylate Lvl: <7 mg/dL — ABNORMAL LOW (ref 7.0–30.0)

## 2020-08-20 LAB — ETHANOL: Alcohol, Ethyl (B): <10 mg/dL (ref <10)

## 2020-08-20 LAB — ACETAMINOPHEN LEVEL: Acetaminophen (Tylenol), Serum: <10 ug/mL — ABNORMAL LOW (ref 10–30)

## 2020-08-20 NOTE — BH Assessment (Signed)
Comprehensive Clinical Assessment (CCA) Screening, Triage and Referral Note  08/20/2020 Joel Carr [id number]  Chief Complaint:  Chief Complaint  Patient presents with  . Psychiatric Evaluation   Visit Diagnosis: Bipolar  Patient Reported Information How did you hear about [place]? No data recorded  Referral name: His outpatient provider   Referral phone number: No data recorded Whom do you see for routine medical problems? No data recorded  Practice/Facility Name: No data recorded  Practice/Facility Phone Number: No data recorded  Name of Contact: No data recorded  Contact Number: No data recorded  Contact Fax Number: No data recorded  Prescriber Name: No data recorded  Prescriber Address (if known): No data recorded What Is the Reason for Your Visit/Call Today? Having SI with no plan or intentions.  How Long Has This Been Causing You Problems? <Week  Have You Recently Been in Any Inpatient Treatment (Hospital/Detox/Crisis Center/28-Day Program)? No   Name/Location of Program/Hospital:No data recorded  How Long Were You There? No data recorded  When Were You Discharged? No data recorded Have You Ever Received Services From [facility] Before? Yes   Who Do You See at [facility]? Medical and mental health treatment  Have You Recently Had Any Thoughts About Hurting Yourself? No   Are You Planning to Commit Suicide/Harm Yourself At This time?  No  Have you Recently Had Thoughts About Hurting Someone [name]? No   Explanation: No data recorded Have You Used Any Alcohol or Drugs in the Past 24 Hours? No   How Long Ago Did You Use Drugs or Alcohol?  No data recorded  What Did You Use and How Much? No data recorded What Do You Feel Would Help You the Most Today? Treatment for Depression or other mood problem  Do You Currently Have a Therapist/Psychiatrist? Yes   Name of Therapist/Psychiatrist: [facility]   Have You Been Recently Discharged From Any Office Practice or  Programs? No   Explanation of Discharge From Practice/Program:  No data recorded    CCA Screening Triage Referral Assessment Type of Contact: Face-to-Face   Is this Initial or Reassessment? No data recorded  Date Telepsych consult ordered in [facility]:  No data recorded  Time Telepsych consult ordered in [facility]:  No data recorded Patient Reported Information Reviewed? Yes   Patient Left Without Being Seen? No data recorded  Reason for Not Completing Assessment: No data recorded Collateral Involvement: No data recorded Does Patient Have a Court Appointed Legal Guardian? No data recorded  Name and Contact of Legal Guardian:  No data recorded If Minor and Not Living with Parent(s), Who has Custody? No data recorded Is CPS involved or ever been involved? Never  Is APS involved or ever been involved? Never  Patient Determined To Be At Risk for Harm To Self or Others Based on Review of Patient Reported Information or Presenting Complaint? No   Method: No data recorded  Availability of Means: No data recorded  Intent: No data recorded  Notification Required: No data recorded  Additional Information for Danger to Others Potential:  No data recorded  Additional Comments for Danger to Others Potential:  No data recorded  Are There Guns or Other Weapons in Your Home?  No data recorded   Types of Guns/Weapons: No data recorded   Are These Weapons Safely Secured?                              No  data recorded   Who Could Verify You Are Able To Have These Secured:    No data recorded Do You Have any Outstanding Charges, Pending Court Dates, Parole/Probation? No data recorded Contacted To Inform of Risk of Harm To Self or Others: No data recorded Location of Assessment: No data recorded Does Patient Present under Involuntary Commitment? No   IVC Papers Initial File Date: No data recorded  [place] of Residence: [place]  Patient Currently Receiving the Following Services: Medication  Management   Determination of Need: Emergent (2 hours)   Options For Referral: Medication Management  Lilyan Gilford MS, LCAS, [facility], [facility] Therapeutic Triage Specialist 08/20/2020 3:12 PM

## 2020-08-20 NOTE — ED Notes (Signed)
This tech assisted pt to changed into [facility] scrubs. Pt belongings includes blue sweatpants, white T-shirts, black hoodie, red underwear, white socks, black iphone and iwatch, black wallet, gray rubber ring, celtics facemask, and red nike sandals.

## 2020-08-20 NOTE — Consult Note (Signed)
[facility] Face-to-Face Psychiatry Consult   Reason for Consult: Consult for 35 year old man with a history of bipolar disorder who came to the hospital voluntarily Referring Physician: Katrinka Blazing Patient Identification: Joel Carr MRN:  [id number] Principal Diagnosis: Bipolar disorder [facility]) Diagnosis:  Principal Problem:   Bipolar disorder (HCC)   Total Time spent with patient: 1 hour  Subjective:   Joel Carr is a 35 y.o. male patient admitted with "I called my psychiatrist and they told me to come here".  HPI: Patient seen chart reviewed.  Patient says he woke up this morning to get ready to go to work and found himself having suicidal thoughts.  Specifically he had thoughts that perhaps "I would be better off not here".  He denies that he had any thoughts of actually carrying through on killing himself.  Did not think about any specific method of doing it.  Was able to think about positive things in his life.  He called his psychiatric provider to check in with them and they advised him to come to the emergency room.  Patient says today his mood is now feeling better.  Denies any suicidal thoughts.  Says he has felt a little more depressed the last few days but has not been otherwise thinking of self-harm and does not have any psychotic thoughts.  Stresses include some financial difficulties at work.  He is compliant with all of his prescribed medicine.  He is not using any abuse of drugs.  Says he drinks only 1 beer on the weekend.  Patient is not having any hallucinations or psychotic symptoms.  He presents as calm and appropriate and cooperative.  Past Psychiatric History: 1 prior hospitalization many years ago with suicidal ideation.  History of diagnosis of bipolar depression.  Currently on Latuda and lamotrigine.  No history of substance abuse.  Risk to Self:   Risk to Others:   Prior Inpatient Therapy:   Prior Outpatient Therapy:    Past Medical History:  Past Medical History:   Diagnosis Date  . Bipolar disorder (HCC)   . COVID-19 virus infection 12/23/2019   mild congestion and body aches  . Depression   . Hyperlipidemia   . RLS (restless legs syndrome)     Past Surgical History:  Procedure Laterality Date  . KNEE ARTHROSCOPY WITH MENISCAL REPAIR Right 10/29/2014   Procedure: KNEE ARTHROSCOPY WITH MENISCAL REPAIR;  Surgeon: Kennedy Bucker, MD;  Location: [facility] ORS;  Service: Orthopedics;  Laterality: Right;  . KNEE SURGERY Right   . TESTICLE SURGERY N/A    as a child   Family History:  Family History  Problem Relation Age of Onset  . Depression Father   . Diabetes Paternal Grandfather   . Heart attack Paternal Grandfather   . Colon cancer Neg Hx    Family Psychiatric  History: Depression in his father possible bipolar disorder Social History:  Social History   Substance and Sexual Activity  Alcohol Use No     Social History   Substance and Sexual Activity  Drug Use No    Social History   Socioeconomic History  . Marital status: Married    Spouse name: Not on file  . Number of children: Not on file  . Years of education: 73-  . Highest education level: GED or equivalent  Occupational History  . Not on file  Tobacco Use  . Smoking status: Current Every Day Smoker    Packs/day: 1.00    Years: 18.00    Pack  years: 18.00    Types: Cigarettes  . Smokeless tobacco: Never Used  . Tobacco comment: 3 cig daily-04/29/2020  Vaping Use  . Vaping Use: Never used  Substance and Sexual Activity  . Alcohol use: No  . Drug use: No  . Sexual activity: Yes  Other Topics Concern  . Not on file  Social History Narrative   Married with 2 kids. Works at [employer]  [employer] and works as Visual merchandiserpaints equipment   Social Determinants of Corporate investment bankerHealth   Financial Resource Strain: Not on [employer]file  Food Insecurity: Not on file  Transportation Needs: Not on file  Physical Activity: Not on file  Stress: Not on file  Social Connections: Not on file    Additional Social History:    Allergies:   Allergies  Allergen Reactions  . Tramadol     Jittery    Labs:  Results for orders placed or performed during the hospital encounter of 08/20/20 (from the past 48 hour(s))  Comprehensive metabolic panel     Status: Abnormal   Collection Time: 08/20/20 11:42 AM  Result Value Ref Range   Sodium 140 135 - 145 mmol/L   Potassium 3.8 3.5 - 5.1 mmol/L   Chloride 106 98 - 111 mmol/L   CO2 25 22 - 32 mmol/L   Glucose, Bld 104 (H) 70 - 99 mg/dL    Comment: Glucose reference range applies only to samples taken after fasting for at least 8 hours.   BUN 9 6 - 20 mg/dL   Creatinine, Ser 9.600.80 0.61 - 1.24 mg/dL   Calcium 9.4 8.9 - 45.410.3 mg/dL   Total Protein 7.0 6.5 - 8.1 g/dL   Albumin 4.5 3.5 - 5.0 g/dL   AST 18 15 - 41 U/L   ALT 13 0 - 44 U/L   Alkaline Phosphatase 54 38 - 126 U/L   Total Bilirubin 0.5 0.3 - 1.2 mg/dL   GFR, Estimated >09>60 >81>60 mL/min    Comment: (NOTE) Calculated using the CKD-EPI Creatinine Equation (2021)    Anion gap 9 5 - 15    Comment: Performed at [facility], [address] [place], [place] [address]  Ethanol     Status: None   Collection Time: 08/20/20 11:42 AM  Result Value Ref Range   Alcohol, Ethyl (B) <10 <10 mg/dL    Comment: (NOTE) Lowest detectable limit for serum alcohol is 10 mg/dL.  For medical purposes only. Performed at [facility], [address] [place], [place] [address]   Salicylate level     Status: Abnormal   Collection Time: 08/20/20 11:42 AM  Result Value Ref Range   Salicylate Lvl <7.0 (L) 7.0 - 30.0 mg/dL    Comment: Performed at [facility], [address] [place], [place] [address]  Acetaminophen level     Status: Abnormal   Collection Time: 08/20/20 11:42 AM  Result Value Ref Range   Acetaminophen (Tylenol), Serum <10 (L) 10 - 30 ug/mL    Comment: (NOTE) Therapeutic concentrations vary significantly. A range of 10-30 ug/mL  may be an  effective concentration for many patients. However, some  are best treated at concentrations outside of this range. Acetaminophen concentrations >150 ug/mL at 4 hours after ingestion  and >50 ug/mL at 12 hours after ingestion are often associated with  toxic reactions.  Performed at [facility], [address] [place], [place] [address]   cbc     Status: None   Collection Time: 08/20/20 11:42  AM  Result Value Ref Range   WBC 7.8 4.0 - 10.5 K/uL   RBC 4.91 4.22 - 5.81 MIL/uL   Hemoglobin 15.3 13.0 - 17.0 g/dL   HCT 16.1 09.6 - 04.5 %   MCV 88.4 80.0 - 100.0 fL   MCH 31.2 26.0 - 34.0 pg   MCHC 35.3 30.0 - 36.0 g/dL   RDW 40.9 81.1 - 91.4 %   Platelets 183 150 - 400 K/uL   nRBC 0.0 0.0 - 0.2 %    Comment: Performed at [facility], [address] [place], [place] [address]  Urine Drug Screen, Qualitative     Status: None   Collection Time: 08/20/20 11:42 AM  Result Value Ref Range   Tricyclic, Ur Screen NONE DETECTED NONE DETECTED   Amphetamines, Ur Screen NONE DETECTED NONE DETECTED   MDMA (Ecstasy)Ur Screen NONE DETECTED NONE DETECTED   Cocaine Metabolite,Ur [place] NONE DETECTED NONE DETECTED   Opiate, Ur Screen NONE DETECTED NONE DETECTED   Phencyclidine (PCP) Ur S NONE DETECTED NONE DETECTED   Cannabinoid 50 Ng, Ur [place] NONE DETECTED NONE DETECTED   Barbiturates, Ur Screen NONE DETECTED NONE DETECTED   Benzodiazepine, Ur Scrn NONE DETECTED NONE DETECTED   Methadone Scn, Ur NONE DETECTED NONE DETECTED    Comment: (NOTE) Tricyclics + metabolites, urine    Cutoff 1000 ng/mL Amphetamines + metabolites, urine  Cutoff 1000 ng/mL MDMA (Ecstasy), urine              Cutoff 500 ng/mL Cocaine Metabolite, urine          Cutoff 300 ng/mL Opiate + metabolites, urine        Cutoff 300 ng/mL Phencyclidine (PCP), urine         Cutoff 25 ng/mL Cannabinoid, urine                 Cutoff 50 ng/mL Barbiturates + metabolites, urine  Cutoff 200 ng/mL Benzodiazepine, urine               Cutoff 200 ng/mL Methadone, urine                   Cutoff 300 ng/mL  The urine drug screen provides only a preliminary, unconfirmed analytical test result and should not be used for non-medical purposes. Clinical consideration and professional judgment should be applied to any positive drug screen result due to possible interfering substances. A more specific alternate chemical method must be used in order to obtain a confirmed analytical result. Gas chromatography / mass spectrometry (GC/MS) is the preferred confirm atory method. Performed at [facility], [address] [place], [place] [address]     No current facility-administered medications for this encounter.   Current Outpatient Medications  Medication Sig Dispense Refill  . amoxicillin-clavulanate (AUGMENTIN) 500-125 MG tablet Take 1 tablet (500 mg total) by mouth 2 (two) times daily for 10 days. 20 tablet 0  . buPROPion (WELLBUTRIN SR) 150 MG 12 hr tablet Take 150 mg by mouth 2 (two) times daily.    . clonazePAM (KLONOPIN) 0.5 MG tablet Take 1 tablet (0.5 mg total) by mouth daily as needed for anxiety. 30 tablet 0  . fexofenadine (ALLEGRA ALLERGY) 180 MG tablet Take 1 tablet (180 mg total) by mouth daily. 10 tablet 0  . lamoTRIgine (LAMICTAL) 100 MG tablet Take 1 tablet (100 mg total) by mouth 2 (two) times daily. 180 tablet 0  . lurasidone (LATUDA) 40 MG TABS tablet Take 1 tablet (40 mg  total) by mouth daily with breakfast. (Patient taking differently: Take 60 mg by mouth daily with breakfast.) 90 tablet 1  . nicotine (NICODERM CQ) 21 mg/24hr patch Place 1 patch (21 mg total) onto the skin daily. 28 patch 0  . QUEtiapine (SEROQUEL) 25 MG tablet Take 25 mg by mouth daily as needed.    . SUMAtriptan (IMITREX) 25 MG tablet Take 1 tablet (25 mg total) by mouth every 2 (two) hours as needed for migraine (NOT TO EXCEED 100 MG). May repeat in 2 hours if headache persists or recurs. 10 tablet 2  . verapamil  (CALAN-SR) 120 MG CR tablet Take 1 tablet (120 mg total) by mouth at bedtime. 30 tablet 2  . albuterol (VENTOLIN HFA) 108 (90 Base) MCG/ACT inhaler Inhale 2 puffs into the lungs every 6 (six) hours as needed for wheezing or shortness of breath. 18 g 0  . Budeson-Glycopyrrol-Formoterol (BREZTRI AEROSPHERE) 160-9-4.8 MCG/ACT AERO Inhale 2 puffs into the lungs in the morning and at bedtime. 5.9 g 0  . nicotine polacrilex (COMMIT) 4 MG lozenge Take 1 lozenge (4 mg total) by mouth as needed. 108 tablet 3    Musculoskeletal: Strength & Muscle Tone: within normal limits Gait & Station: normal Patient leans: N/A            Psychiatric Specialty Exam:  Presentation  General Appearance: No data recorded Eye Contact:No data recorded Speech:No data recorded Speech Volume:No data recorded Handedness:No data recorded  Mood and Affect  Mood:No data recorded Affect:No data recorded  Thought Process  Thought Processes:No data recorded Descriptions of Associations:No data recorded Orientation:No data recorded Thought Content:No data recorded History of Schizophrenia/Schizoaffective disorder:No data recorded Duration of Psychotic Symptoms:No data recorded Hallucinations:No data recorded Ideas of Reference:No data recorded Suicidal Thoughts:No data recorded Homicidal Thoughts:No data recorded  Sensorium  Memory:No data recorded Judgment:No data recorded Insight:No data recorded  Executive Functions  Concentration:No data recorded Attention Span:No data recorded Recall:No data recorded Fund of Knowledge:No data recorded Language:No data recorded  Psychomotor Activity  Psychomotor Activity:No data recorded  Assets  Assets:No data recorded  Sleep  Sleep:No data recorded  Physical Exam: Physical Exam Vitals and nursing note reviewed.  Constitutional:      Appearance: Normal appearance.  HENT:     Head: Normocephalic and atraumatic.     Mouth/Throat:     Pharynx:  Oropharynx is clear.  Eyes:     Pupils: Pupils are equal, round, and reactive to light.  Cardiovascular:     Rate and Rhythm: Normal rate and regular rhythm.  Pulmonary:     Effort: Pulmonary effort is normal.     Breath sounds: Normal breath sounds.  Abdominal:     General: Abdomen is flat.     Palpations: Abdomen is soft.  Musculoskeletal:        General: Normal range of motion.  Skin:    General: Skin is warm and dry.  Neurological:     General: No focal deficit present.     Mental Status: He is alert. Mental status is at baseline.  Psychiatric:        Attention and Perception: Attention normal. He is attentive.        Mood and Affect: Mood normal.        Speech: Speech normal.        Behavior: Behavior normal.        Thought Content: Thought content normal.        Cognition and Memory: Cognition normal.  Judgment: Judgment normal.    Review of Systems  Constitutional: Negative.   HENT: Negative.   Eyes: Negative.   Respiratory: Negative.   Cardiovascular: Negative.   Gastrointestinal: Negative.   Musculoskeletal: Negative.   Skin: Negative.   Neurological: Negative.   Psychiatric/Behavioral: Positive for depression. Negative for hallucinations, memory loss, substance abuse and suicidal ideas. The patient is not nervous/anxious and does not have insomnia.    Blood pressure 129/82, pulse 74, resp. rate 14, height 5\' 8"  (1.727 m), weight 77.1 kg, SpO2 98 %. Body mass index is 25.85 kg/m.  Treatment Plan Summary: Plan Patient is currently calm lucid with a normal and appropriate affect.  He denies any suicidal thoughts at all.  He has a good support system and appropriate treatment plan in place.  Patient does not meet commitment criteria and does not require or desire inpatient treatment.  Supportive counseling and encouragement to continue managing his illness as he has.  Case reviewed with emergency room doctor and TTS.  Patient can be released from the emergency  room no new prescriptions written.  Disposition: No evidence of imminent risk to self or others at present.   Patient does not meet criteria for psychiatric inpatient admission. Supportive therapy provided about ongoing stressors. Discussed crisis plan, support from social network, calling 911, coming to the Emergency Department, and calling Suicide Hotline.  , MD 08/20/2020 3:54 PM

## 2020-08-20 NOTE — ED Provider Notes (Signed)
[facility] Emergency Department Provider Note ____________________________________________   Event Date/Time   First MD Initiated Contact with Patient 08/20/20 1204     (approximate)  I have reviewed the triage vital signs and the nursing notes.  HISTORY  Chief Complaint Psychiatric Evaluation   HPI Joel Carr is a 35 y.o. malewho presents to the ED for evaluation of his mental health.;   Chart review indicates hx of bipolar disorder, depression.   Patient presents to the ED for evaluation of suicidal thoughts.  Patient reports awakening this morning at his typical time with thoughts of helplessness, anhedonia and thinking it may be better if he just was not around.  Denies formulated plans of suicide, homicidality or AV hallucinations.  Denies recent illnesses.  Reports compliance with his various mental health medications.  Denies recreational drug use.  Reports these suicidal thoughts have passed and he feels better now.  He is requesting to speak with a psychiatrist.  Past Medical History:  Diagnosis Date  . Bipolar disorder (HCC)   . COVID-19 virus infection 12/23/2019   mild congestion and body aches  . Depression   . Hyperlipidemia   . RLS (restless legs syndrome)     Patient Active Problem List   Diagnosis Date Noted  . Former smoker 04/29/2020  . Current every day smoker 04/29/2020  . Other emphysema (HCC) 04/29/2020  . Body aches after vaccination 02/12/2020  . DOE (dyspnea on exertion) 01/23/2020  . History of COVID-19 01/23/2020  . Abnormal CXR 01/23/2020  . History of asthma 01/23/2020  . Acute non-recurrent maxillary sinusitis 10/04/2019  . Finger pain, left 07/17/2019  . Flank pain 05/08/2019  . Tension headache 05/08/2019  . Anxiety 10/23/2018  . GERD (gastroesophageal reflux disease) 07/07/2018  . Insomnia 07/07/2018  . Nodule of skin of abdomen 02/08/2018  . Impingement syndrome of shoulder region 10/10/2017  . RLS  (restless legs syndrome) 02/02/2017  . Mixed hyperlipidemia 04/10/2015  . Bipolar disorder (HCC) 01/01/2015    Past Surgical History:  Procedure Laterality Date  . KNEE ARTHROSCOPY WITH MENISCAL REPAIR Right 10/29/2014   Procedure: KNEE ARTHROSCOPY WITH MENISCAL REPAIR;  Surgeon: Kennedy Bucker, MD;  Location: [facility] ORS;  Service: Orthopedics;  Laterality: Right;  . KNEE SURGERY Right   . TESTICLE SURGERY N/A    as a child    Prior to Admission medications   Medication Sig Start Date End Date Taking? Authorizing Provider  albuterol (VENTOLIN HFA) 108 (90 Base) MCG/ACT inhaler Inhale 2 puffs into the lungs every 6 (six) hours as needed for wheezing or shortness of breath. 11/30/18   Particia Nearing, PA-C  amoxicillin-clavulanate (AUGMENTIN) 500-125 MG tablet Take 1 tablet (500 mg total) by mouth 2 (two) times daily for 10 days. 08/15/20 08/25/20  Vigg, Avanti, MD  Budeson-Glycopyrrol-Formoterol (BREZTRI AEROSPHERE) 160-9-4.8 MCG/ACT AERO Inhale 2 puffs into the lungs in the morning and at bedtime. 04/29/20   Coral Ceo, NP  buPROPion (WELLBUTRIN SR) 150 MG 12 hr tablet Take 150 mg by mouth 2 (two) times daily. 04/18/20   [provider]  clonazePAM (KLONOPIN) 0.5 MG tablet Take 1 tablet (0.5 mg total) by mouth daily as needed for anxiety. 01/14/20   Theadore Nan, NP  fexofenadine [facility] ALLERGY) 180 MG tablet Take 1 tablet (180 mg total) by mouth daily. 08/15/20   Vigg, Avanti, MD  lamoTRIgine (LAMICTAL) 100 MG tablet Take 1 tablet (100 mg total) by mouth 2 (two) times daily. 01/14/20   Arvilla Market,  Ranae Plumber, NP  lurasidone (LATUDA) 40 MG TABS tablet Take 1 tablet (40 mg total) by mouth daily with breakfast. 01/17/20   Theadore Nan, NP  nicotine (NICODERM CQ) 21 mg/24hr patch Place 1 patch (21 mg total) onto the skin daily. 08/15/20   Vigg, Avanti, MD  nicotine polacrilex (COMMIT) 4 MG lozenge Take 1 lozenge (4 mg total) by mouth as needed. 04/29/20   Coral Ceo, NP  QUEtiapine  (SEROQUEL) 25 MG tablet Take 25 mg by mouth daily as needed. 02/08/20   [provider]  SUMAtriptan (IMITREX) 25 MG tablet Take 1 tablet (25 mg total) by mouth every 2 (two) hours as needed for migraine (NOT TO EXCEED 100 MG). May repeat in 2 hours if headache persists or recurs. 07/22/20   Vigg, Avanti, MD  verapamil (CALAN-SR) 120 MG CR tablet Take 1 tablet (120 mg total) by mouth at bedtime. 07/22/20   Loura Pardon, MD    Allergies Tramadol  Family History  Problem Relation Age of Onset  . Depression Father   . Diabetes Paternal Grandfather   . Heart attack Paternal Grandfather   . Colon cancer Neg Hx     Social History Social History   Tobacco Use  . Smoking status: Current Every Day Smoker    Packs/day: 1.00    Years: 18.00    Pack years: 18.00    Types: Cigarettes  . Smokeless tobacco: Never Used  . Tobacco comment: 3 cig daily-04/29/2020  Vaping Use  . Vaping Use: Never used  Substance Use Topics  . Alcohol use: No  . Drug use: No    Review of Systems  Constitutional: No fever/chills Eyes: No visual changes. ENT: No sore throat. Cardiovascular: Denies chest pain. Respiratory: Denies shortness of breath. Gastrointestinal: No abdominal pain.  No nausea, no vomiting.  No diarrhea.  No constipation. Genitourinary: Negative for dysuria. Musculoskeletal: Negative for back pain. Skin: Negative for rash. Neurological: Negative for headaches, focal weakness or numbness.  ____________________________________________   PHYSICAL EXAM:  VITAL SIGNS: Vitals:   08/20/20 1133  BP: 127/88  Pulse: 75  Resp: 18  SpO2: 100%     Constitutional: Alert and oriented. Well appearing and in no acute distress.  Linear thought processes.  Pleasant and conversational. Eyes: Conjunctivae are normal. PERRL. EOMI. Head: Atraumatic. Nose: No congestion/rhinnorhea. Mouth/Throat: Mucous membranes are moist.  Oropharynx non-erythematous. Neck: No stridor. No cervical  spine tenderness to palpation. Cardiovascular: Normal rate, regular rhythm. Grossly normal heart sounds.  Good peripheral circulation. Respiratory: Normal respiratory effort.  No retractions. Lungs CTAB. Gastrointestinal: Soft , nondistended, nontender to palpation. No CVA tenderness. Musculoskeletal: No lower extremity tenderness nor edema.  No joint effusions. No signs of acute trauma. Neurologic:  Normal speech and language. No gross focal neurologic deficits are appreciated. No gait instability noted. Skin:  Skin is warm, dry and intact. No rash noted. Psychiatric: Mood and affect are normal. Speech and behavior are normal.  ____________________________________________   LABS (all labs ordered are listed, but only abnormal results are displayed)  Labs Reviewed  COMPREHENSIVE METABOLIC PANEL - Abnormal; Notable for the following components:      Result Value   Glucose, Bld 104 (*)    All other components within normal limits  SALICYLATE LEVEL - Abnormal; Notable for the following components:   Salicylate Lvl <7.0 (*)    All other components within normal limits  ACETAMINOPHEN LEVEL - Abnormal; Notable for the following components:   Acetaminophen (Tylenol), Serum <10 (*)  All other components within normal limits  ETHANOL  CBC  URINE DRUG SCREEN, QUALITATIVE ([facility] ONLY)   ____________________________________________   PROCEDURES and INTERVENTIONS  Procedure(s) performed (including Critical Care):  Procedures  Medications - No data to display  ____________________________________________   MDM / ED COURSE   35 year old male with history of depression and bipolar disorder presents to the ED with acute depression, anhedonia and suicidal thoughts, without evidence of acute manic pathology, and amenable to voluntary psychiatric evaluation.  Normal vitals.  Exam reassuring without evidence of acute derangements.  He looks well, with linear thought processes, without  distress or evidence of acute medical derangements.  Work-up is reassuring without acute pathology.  I see no barriers to psychiatric evaluation and disposition.  No indication for IVC.  ____________________________________________   FINAL CLINICAL IMPRESSION(S) / ED DIAGNOSES  Final diagnoses:  Suicidal ideation  Anhedonia     ED Discharge Orders    None       Mikolaj Woolstenhulme Katrinka Blazing   Note:  This document was prepared using Dragon voice recognition software and may include unintentional dictation errors.   [place], MD 08/20/20 1350

## 2020-08-20 NOTE — ED Triage Notes (Signed)
Pt comes into the ED via POV c/o psychiatric evaluation.  Pt states his psychiatrist sent him over here because he was having SI thoughts this morning.  Pt denies that he is having any SI thoughts currently and denies any plan made.  Pt calm, cooperative, and in NAD with even and unlabored respirations.

## 2020-08-20 NOTE — ED Notes (Signed)
Psychiatry at bedside.

## 2020-08-20 NOTE — ED Notes (Signed)
Pt reports that he was sent here by his psychiatrist for psych evaluation. Pt states that when he woke up this morning he had thoughts that he would be better off if he was not alive, pt denies any recent life stressors that would cause him to feel this way. Pt states that he did not have specific plan and did not intend to harm himself. Pt states that he has been taking all of his medications.  Pt states that he sees a psychiatrist as an outpatient and that he also has a therapist through his work that he sees as well.  Pt very calm and cooperative, pt denies any needs at this time and will let us know if he needs anything.

## 2020-08-20 NOTE — ED Notes (Signed)
Lunch tray was given with juice. 

## 2020-08-20 NOTE — ED Triage Notes (Signed)
Sent to ED by psychiatrist for eval.  Patient states he was having SI, but currently denies SI.  Currently Calm and cooperative.

## 2020-08-20 NOTE — ED Notes (Signed)
Medication rec done by this RN. Information provided by pt and pts wife who is at bedside

## 2020-08-26 ENCOUNTER — Telehealth (HOSPITAL_COMMUNITY): Payer: Self-pay | Admitting: Psychiatry

## 2020-08-26 NOTE — Telephone Encounter (Signed)
D:  Pt called to inquire about MH-IOP.  A:  Returned call to orient pt.  Pt states he would like to contact his insurance company first.  "I have a $500 deductible in which I would have to pay first, so I will need to see how I can come up with that amount."  Pt states he will call case manager back.  Encouraged pt to call case manager back once he speaks to his insurance company.  Pt currently denies any SI.  R:  Pt receptive.

## 2020-08-28 ENCOUNTER — Telehealth (HOSPITAL_COMMUNITY): Payer: Self-pay | Admitting: Psychiatry

## 2020-08-28 NOTE — Telephone Encounter (Signed)
D:  Pt phoned and left vm that he has questions re: upcoming MH-IOP start date (09-02-20).  A:  Placed call to pt, but there was no answer.  Left vm for him to call cm back.

## 2020-09-01 ENCOUNTER — Other Ambulatory Visit: Payer: Self-pay

## 2020-09-01 ENCOUNTER — Other Ambulatory Visit (HOSPITAL_COMMUNITY): Payer: 59 | Attending: Psychiatry | Admitting: Psychiatry

## 2020-09-01 DIAGNOSIS — Z79899 Other long term (current) drug therapy: Secondary | ICD-10-CM | POA: Insufficient documentation

## 2020-09-01 DIAGNOSIS — F419 Anxiety disorder, unspecified: Secondary | ICD-10-CM | POA: Insufficient documentation

## 2020-09-01 DIAGNOSIS — F3132 Bipolar disorder, current episode depressed, moderate: Secondary | ICD-10-CM | POA: Insufficient documentation

## 2020-09-01 DIAGNOSIS — F17210 Nicotine dependence, cigarettes, uncomplicated: Secondary | ICD-10-CM | POA: Insufficient documentation

## 2020-09-01 NOTE — Progress Notes (Signed)
Comprehensive Clinical Assessment (CCA) Note  09/01/2020 Joel Carr [id number]  Chief Complaint:  Chief Complaint  Patient presents with  . Depression  . Anxiety   Visit Diagnosis: F31.32   CCA Screening, Triage and Referral (STR)  Patient Reported Information How did you hear about [place]? Other (Comment)  Referral name: Outpt provider  Referral phone number: No data recorded  Whom do you see for routine medical problems? Primary Care  [facility]  Practice[facility]Facility Phone Number: No data recorded Name of Contact: No data recorded Contact Number: No data recorded Contact Fax Number: No data recorded Prescriber Name: Joel Carr  Prescriber Address (if known): No data recorded  What Is the Reason for Your Visit/Call Today? MH-IOP  How Long Has This Been Causing You Problems? 1 wk - 1 month  What Do You Feel Would Help You the Most Today? Treatment for Depression or other mood problem   Have You Recently Been in Any Inpatient Treatment (Hospital/Detox/Crisis Center/28-Day Program)? No  Name/Location of Program/Hospital:No data recorded How Long Were You There? No data recorded When Were You Discharged? No data recorded  Have You Ever Received Services From [facility] Before? Yes  Who Do You See at [facility]? Medical and mental health treatment   Have You Recently Had Any Thoughts About Hurting Yourself? No  Are You Planning to Commit Suicide/Harm Yourself At This time? No   Have you Recently Had Thoughts About Hurting Someone Joel Carr? No  Explanation: No data recorded  Have You Used Any Alcohol or Drugs in the Past 24 Hours? No  How Long Ago Did You Use Drugs or Alcohol? No data recorded What Did You Use and How Much? No data recorded  Do You Currently Have Joel Therapist/Psychiatrist? Yes  Name of Therapist/Psychiatrist: A Beutiful Carr   Have You Been Recently Discharged From Any Office Practice or Programs?  No  Explanation of Discharge From Practice/Program: No data recorded    CCA Screening Triage Referral Assessment Type of Contact: Face-to-Face  Is this Initial or Reassessment? No data recorded Date Telepsych consult ordered in [facility]:  No data recorded Time Telepsych consult ordered in [facility]:  No data recorded  Patient Reported Information Reviewed? Yes  Patient Left Without Being Seen? No data recorded Reason for Not Completing Assessment: No data recorded  Collateral Involvement: No data recorded  Does Patient Have Joel Court Appointed Legal Guardian? No data recorded Name and Contact of Legal Guardian: No data recorded If Minor and Not Living with Parent(s), Who has Custody? No data recorded Is CPS involved or ever been involved? Never  Is APS involved or ever been involved? Never   Patient Determined To Be At Risk for Harm To Self or Others Based on Review of Patient Reported Information or Presenting Complaint? No  Method: No data recorded Availability of Means: No data recorded Intent: No data recorded Notification Required: No data recorded Additional Information for Danger to Others Potential: No data recorded Additional Comments for Danger to Others Potential: No data recorded Are There Guns or Other Weapons in Your Home? No data recorded Types of Guns/Weapons: No data recorded Are These Weapons Safely Secured?                            No data recorded Who Could Verify You Are Able To Have These Secured: No data recorded Do You Have any Outstanding Charges, Pending Court Dates, Parole/Probation? No data recorded  Contacted To Inform of Risk of Harm To Self or Others: No data recorded  Location of Assessment: No data recorded  Does Patient Present under Involuntary Commitment? No  IVC Papers Initial File Date: No data recorded  [place] of Residence: [place]   Patient Currently Receiving the Following Services: IOP (Intensive Outpatient Program)   Determination  of Need: Routine (7 days)   Options For Referral: Intensive Outpatient Therapy     CCA Biopsychosocial Intake/Chief Complaint:  This is Joel 35 mwm who was referred per [facility]); treatment for Bipolar Disorder.  Pt states he was dx'd 14 yrs ago.  States Bipolar D/O with SI worsened the end of May 2022.  "I was sent to [facility] ED for an assessment."  Stressors:  1) Job Biomedical scientist) of three yrs.  2) Strained finances:  States he hasn't been getting the hrs he should in order to support his family.  Pt reports hx being admitted at [facility] inpt unit 14 yrs ago whenever he was dx'd Bipolar.  Admits he had attempted suicide via OD during that time.  Has been seeing Joel Carr for 1 1/2 yrs and also sees an EAP.  Family hx:  Father (ETOH, Bipolar D/O and Depression); Mother (Drugs)  Current Symptoms/Problems: Denies SI/HI or Joel/V hallucinations, sadness, poor sleep d/t nightmares, increased anxiety, poor concentration, racing thoughts, no energy, no motivation, anhedonia, crying spells, poor appetite (lost 5 lbs within 2 weeks), irritable   Patient Reported Schizophrenia/Schizoaffective Diagnosis in Past: No   Strengths: Has Joel supportive family.  Preferences: Group therapy to learn coping skills  Abilities: No data recorded  Type of Services Patient Feels are Needed: MH-IOP   Initial Clinical Notes/Concerns: No data recorded  Mental Health Symptoms Depression:  Change in energy/activity; Difficulty Concentrating; Fatigue; Increase/decrease in appetite; Irritability; Sleep (too much or little); Tearfulness; Weight gain/loss   Duration of Depressive symptoms: Greater than two weeks   Mania:  Irritability; Racing thoughts   Anxiety:   Restlessness; Worrying   Psychosis:  None   Duration of Psychotic symptoms: No data recorded  Trauma:  N/Joel   Obsessions:  No data recorded  Compulsions:  N/Joel   Inattention:  N/Joel   Hyperactivity/Impulsivity:  N/Joel    Oppositional/Defiant Behaviors:  N/Joel   Emotional Irregularity:  N/Joel   Other Mood/Personality Symptoms:  No data recorded   Mental Status Exam Appearance and self-care  Stature:  Average   Weight:  Average weight   Clothing:  Casual   Grooming:  Normal   Cosmetic use:  None   Posture/gait:  Normal   Motor activity:  Not Remarkable   Sensorium  Attention:  Normal   Concentration:  Normal   Orientation:  X5   Recall/memory:  Normal   Affect and Mood  Affect:  Appropriate   Mood:  Depressed   Relating  Eye contact:  Normal   Facial expression:  Responsive   Attitude toward examiner:  Cooperative   Thought and Language  Speech flow: Normal   Thought content:  Appropriate to Mood and Circumstances   Preoccupation:  None   Hallucinations:  None   Organization:  No data recorded  [employer] of Knowledge:  Average   Intelligence:  Average   Abstraction:  Normal   Judgement:  Good   Reality Testing:  Adequate   Insight:  Good   Decision Making:  Vacilates   Social Functioning  Social Maturity:  Isolates   Social Judgement:  Normal   Stress  Stressors:  Surveyor, quantityinancial; Work   Coping Ability:  Human resources officerverwhelmed   Skill Deficits:  Scientist, physiologicalDecision making; Communication   Supports:  Family     Religion: Religion/Spirituality Are You Joel Religious Person?: No  Leisure/Recreation: Leisure / Recreation Do You Have Hobbies?: Yes Leisure and Hobbies: fishing, but hasn't d/t depression  Exercise/Diet: Exercise/Diet Do You Exercise?: No Have You Gained or Lost Joel Significant Amount of Weight in the Past Six Months?: Yes-Lost Number of Pounds Lost?: 5 Do You Follow Joel Special Diet?: No Do You Have Any Trouble Sleeping?: Yes Explanation of Sleeping Difficulties: decreased sleep d/t nightmares   CCA Employment/Education Employment/Work Situation: Employment / Work Situation Employment situation: Employed Where is patient currently  employed?: [employer] How long has patient been employed?: 3 yrs Patient's job has been impacted by current illness: Yes Describe how patient's job has been impacted: difficulty focusing and irritable What is the longest time patient has Joel held Joel job?: 10 yrs Where was the patient employed at that time?: [employer] Has patient ever been in the [employer] and Companymilitary?: No  Education: Education Is Patient Currently Attending School?: No Did Garment/textile technologistYou Graduate From [employer] School?: No (Got Joel GED) Did Theme park managerYou Attend College?: No Did Designer, television/film setYou Attend Graduate School?: No Did You Have An Individualized Education Program (IIEP): No Did You Have Any Difficulty At School?: No   CCA Family/Childhood History Family and Relationship History: Family history Marital status: Married Number of Years Married: 14 What types of issues is patient dealing with in the relationship?: Supportive wife What is your sexual orientation?: heterosexual Does patient have children?: Yes How many children?: 2 How is patient's relationship with their children?: 36 yr old son and 10012 yr old daughter  Childhood History:  Childhood History By whom was/is the patient raised?: Father Additional [place] history information[place]  "Bad Childhood."  Reports conflictual relationship with mother d/t her being an addict.  Parents divorced when pt was in the 3rd grade d/t mother's addiction.  Father suffered with ETOH, Bipolar D/O and depression.  Pt states he lived with father.  Denies any trauma or abuse.  States he didn't graduate high school d/t hanging with the wrong crowd. Patient's description of current relationship with people who raised him/her: Still no relationship with mother Does patient have siblings?: Yes Number of Siblings: 2 Description of patient's current relationship with siblings: 1 older brother and one younger stepsister Did patient suffer any verbal/emotional/physical/sexual abuse as Joel child?: No Did patient  suffer from severe childhood neglect?: No Has patient ever been sexually abused/assaulted/raped as an adolescent or adult?: No Was the patient ever Joel victim of Joel crime or Joel disaster?: No Witnessed domestic violence?: No Has patient been affected by domestic violence as an adult?: No  Child/Adolescent Assessment:     CCA Substance Use Alcohol/Drug Use: Alcohol / Drug Use Pain Medications: cc: MAR Prescriptions: Latuda, Lamictal, Klonopin Over the Counter: cc: MAR History of alcohol / drug use?: Yes Substance #1 Name of Substance 1: reports Joel hx of heavy ETOH 1 - Age of First Use: ukn 1 - Amount (size/oz): ukn 1 - Frequency: ukn 1 - Duration: ukn 1 - Last Use / Amount: "occasional" 1 - Method of Aquiring: ukn 1- Route of Use: ukn                       ASAM's:  Six Dimensions of Multidimensional Assessment  Dimension 1:  Acute Intoxication and/or Withdrawal  Potential:      Dimension 2:  Biomedical Conditions and Complications:      Dimension 3:  Emotional, Behavioral, or Cognitive Conditions and Complications:     Dimension 4:  Readiness to Change:     Dimension 5:  Relapse, Continued use, or Continued Problem Potential:     Dimension 6:  Recovery/Living Environment:     ASAM Severity Score:    ASAM Recommended Level of Treatment:     Substance use Disorder (SUD)    Recommendations for Services/Supports/Treatments: Recommendations for Services/Supports/Treatments Recommendations For Services/Supports/Treatments: IOP (Intensive Outpatient Program)  DSM5 Diagnoses: Patient Active Problem List   Diagnosis Date Noted  . Former smoker 04/29/2020  . Current every day smoker 04/29/2020  . Other emphysema (HCC) 04/29/2020  . Body aches after vaccination 02/12/2020  . DOE (dyspnea on exertion) 01/23/2020  . History of COVID-19 01/23/2020  . Abnormal CXR 01/23/2020  . History of asthma 01/23/2020  . Acute non-recurrent maxillary sinusitis 10/04/2019  . Finger  pain, left 07/17/2019  . Flank pain 05/08/2019  . Tension headache 05/08/2019  . Anxiety 10/23/2018  . GERD (gastroesophageal reflux disease) 07/07/2018  . Insomnia 07/07/2018  . Nodule of skin of abdomen 02/08/2018  . Impingement syndrome of shoulder region 10/10/2017  . RLS (restless legs syndrome) 02/02/2017  . Mixed hyperlipidemia 04/10/2015  . Bipolar disorder (HCC) 01/01/2015    Patient Centered Plan: Patient is on the following Treatment Plan(s):  Depression Pt will improve his mood as evidenced by being happy again, managing mood and coping with daily stressors for 5 out of 7 days for 60 days.   Oriented pt to virtual MH-IOP.  Pt gave verbal consent for tx, to release chart information to referred providers and to complete any forms if needed.  Pt also gave consent for attending group virtually d/t COVID social distancing restrictions.  Encouraged support groups.  F/U with Dr. Argie Ramming and EAP therapist.  Referrals to Alternative Service(s): Referred to Alternative Service(s):   Place:   Date:   Time:    Referred to Alternative Service(s):   Place:   Date:   Time:    Referred to Alternative Service(s):   Place:   Date:   Time:    Referred to Alternative Service(s):   Place:   Date:   Time:     Forever Arechiga, RITA, M.Ed, CNA

## 2020-09-02 ENCOUNTER — Encounter (HOSPITAL_COMMUNITY): Payer: Self-pay | Admitting: Psychiatry

## 2020-09-02 ENCOUNTER — Other Ambulatory Visit (HOSPITAL_COMMUNITY): Payer: 59 | Admitting: Psychiatry

## 2020-09-02 ENCOUNTER — Other Ambulatory Visit: Payer: Self-pay

## 2020-09-02 DIAGNOSIS — Z79899 Other long term (current) drug therapy: Secondary | ICD-10-CM | POA: Diagnosis not present

## 2020-09-02 DIAGNOSIS — F17210 Nicotine dependence, cigarettes, uncomplicated: Secondary | ICD-10-CM | POA: Diagnosis not present

## 2020-09-02 DIAGNOSIS — F3132 Bipolar disorder, current episode depressed, moderate: Secondary | ICD-10-CM

## 2020-09-02 DIAGNOSIS — F419 Anxiety disorder, unspecified: Secondary | ICD-10-CM | POA: Diagnosis not present

## 2020-09-02 NOTE — Progress Notes (Signed)
Virtual Visit via Video Note  I connected with Joel Carr on 09/02/20 at  9:00 AM EDT by a video enabled telemedicine application and verified that I am speaking with the correct person using two identifiers.  At orientation to the IOP program, Case Manager discussed the limitations of evaluation and management by telemedicine and the availability of in person appointments. The patient expressed understanding and agreed to proceed with virtual visits throughout the duration of the program.   Location:  Patient: Patient Home Provider: Home Office   History of Present Illness: Bipolar DO  Observations/Objective: Check In: Case Manager checked in with all participants to review discharge dates, insurance authorizations, work-related documents and needs from the treatment team regarding medications. Client stated needs and engaged in discussion.   Initial Therapeutic Activity: Counselor facilitated a check-in with group members to assess mood and current functioning. Client shared details of their mental health management since our last session, including challenges and successes. Counselor engaged group in discussion, covering the following topics: warning signs of self-harm/SI, generational differences, coping with mental health stigma, behavioral train analysis, being empathetic, DBT and setting boundaries. Client presents with moderate depression and moderate anxiety. Client denied any current SI/HI/psychosis.  Second Therapeutic Activity: Counselor presented the cycle of anxiety and cycle of depression to the group, highlighting that avoidance exacerbates the issues/symptoms. Counselor prompted group to create 2 lists, one how anxiety presents for them, and likewise for depression. Counselor then had group members review lists to note similarities and differences to promote awareness and understanding of warning signs. Group members then shared their observations and reflections with the group.  Client engaged well and participated appropriately in activity with thoughtful responses. Counselor finally challenged group to identify action steps in alleviating symptoms and combat avoidance.   Check Out: Counselor prompted group members to share what self-care practice or productivity activity they will engage in today. Group members shared their plans with the group. Counselor closed program by allowing time to celebrate a graduating group member. Counselor shared reflections on progress and allow space for group members to share well wishes and appreciates to the graduating client. Counselor prompted graduating client to share takeaways, reflect on progress and final thoughts for the group. Client endorsed safety plan to be followed to prevent safety issues.   Assessment and Plan: Clinician recommends that Client remain in IOP treatment to better manage mental health symptoms, stabilization and to address treatment plan goals. Clinician recommends adherence to crisis/safety plan, taking medications as prescribed, and following up with medical professionals if any issues arise.    Follow Up Instructions: Clinician will send Webex link for next session. The Client was advised to call back or seek an in-person evaluation if the symptoms worsen or if the condition fails to improve as anticipated.     I provided 180 minutes of non-face-to-face time during this encounter.     Hilbert Odor, LCSW

## 2020-09-03 ENCOUNTER — Encounter (HOSPITAL_COMMUNITY): Payer: Self-pay | Admitting: Family

## 2020-09-03 ENCOUNTER — Other Ambulatory Visit (HOSPITAL_COMMUNITY): Payer: 59 | Admitting: Family

## 2020-09-03 DIAGNOSIS — F3132 Bipolar disorder, current episode depressed, moderate: Secondary | ICD-10-CM

## 2020-09-03 NOTE — Progress Notes (Signed)
Virtual Visit via Video Note  I connected with Joel Carr on 09/03/20 at  9:00 AM EDT by a video enabled telemedicine application and verified that I am speaking with the correct person using two identifiers.  At orientation to the IOP program, Case Manager discussed the limitations of evaluation and management by telemedicine and the availability of in person appointments. The patient expressed understanding and agreed to proceed with virtual visits throughout the duration of the program.   Location:  Patient: Patient Home Provider: Home Office   History of Present Illness: Bipolar affective DO  Observations/Objective: Check In: Case Manager checked in with all participants to review discharge dates, insurance authorizations, work-related documents and needs from the treatment team regarding medications. Client stated needs and engaged in discussion.   Initial Therapeutic Activity: Counselor facilitated a check-in with group members to assess mood and current functioning. Client shared details of their mental health management since our last session, including challenges and successes. Counselor engaged group in discussion, covering the following topics: utilizations of safety plans, action plans, reconnecting with support group, grief and loss and positive self-talk. Client presents with moderate depression and moderate anxiety. Client denied any current SI/HI/psychosis.  Second Therapeutic Activity: Counselor reviewed information on the cycle of anxiety and cycle of depression to the group, highlighting that avoidance exacerbates the issues/symptoms. Counselor prompted group to create 2 lists, one how anxiety presents for them, and likewise for depression. Counselor then had group members review lists to note similarities and differences to promote awareness and understanding of warning signs. Group members then shared their observations and reflections with the group. Client engaged well and  participated appropriately in activity with thoughtful responses. Counselor finally challenged group to identify action steps in alleviating symptoms and combat avoidance. Group members shared their plans   Check Out: Counselor prompted group members to share what self-care practice or productivity activity they will engage in today. Group members shared their plans with the group. Client endorsed safety plan to be followed to prevent safety issues.   Assessment and Plan: Clinician recommends that Client remain in IOP treatment to better manage mental health symptoms, stabilization and to address treatment plan goals. Clinician recommends adherence to crisis/safety plan, taking medications as prescribed, and following up with medical professionals if any issues arise.    Follow Up Instructions: Clinician will send Webex link for next session. The Client was advised to call back or seek an in-person evaluation if the symptoms worsen or if the condition fails to improve as anticipated.     I provided 180 minutes of non-face-to-face time during this encounter.     Hilbert Odor, LCSW

## 2020-09-03 NOTE — Progress Notes (Signed)
Psychiatric Initial Adult Assessment   Patient Identification: Joel Carr MRN:  [id number] Date of Evaluation:  09/03/2020 Referral Source:  Chief Complaint:   Visit Diagnosis:    ICD-10-CM   1. Bipolar affective disorder, currently depressed, moderate (HCC)  F31.32     History of Present Illness:  Joel Carr 35 year old Caucasian male that presents due to worsening anxiety and depression due to financial and job stressors.  Reports his main concerns is due to " I am the only one working in the house at this time."  states he is constantly worried about bills. Joel Carr reports he is married with 2 children.  76 year old and 9 year old.  States his wife has been supportive.  States he is currently employed by [employer].  Joel Carr reported previous inpatient admission for 14 years prior where he was diagnosed with bipolar disorder.  Denies a history of physical sexual abuse.    Denied any other hospitalizations since.  Reports he has been taking medications as indicated.  States [facility] where [facility] is prescribed Latuda, Lamictal and Klonopin as needed.  He reports taking and tolerating medications well.  Reported family history with bipolar disorder.  States paternal uncle committed suicide.  Father diagnosed bipolar, depression and substance abuse..  Mother: Substance abuse history.  Denied history of self injures behaviors.  Denied attending any other partial and/or intensive outpatient programming since his discharge 14 years prior.  Patient to start intensive outpatient programming on 09/03/2018  Associated Signs/Symptoms: Depression Symptoms:  depressed mood, difficulty concentrating, anxiety, panic attacks, (Hypo) Manic Symptoms:  Distractibility, Irritable Mood, Anxiety Symptoms:  Excessive Worry, Psychotic Symptoms:  Hallucinations: None PTSD Symptoms: NA  Past Psychiatric History:   Previous Psychotropic Medications: No   Substance Abuse History in  the last 12 months:  No.  Consequences of Substance Abuse: NA  Past Medical History:  Past Medical History:  Diagnosis Date  . Anxiety   . Bipolar disorder (HCC)   . COVID-19 virus infection 12/23/2019   mild congestion and body aches  . Depression   . Hyperlipidemia   . RLS (restless legs syndrome)     Past Surgical History:  Procedure Laterality Date  . KNEE ARTHROSCOPY WITH MENISCAL REPAIR Right 10/29/2014   Procedure: KNEE ARTHROSCOPY WITH MENISCAL REPAIR;  Surgeon: Kennedy Bucker, MD;  Location: [facility] ORS;  Service: Orthopedics;  Laterality: Right;  . KNEE SURGERY Right   . TESTICLE SURGERY N/A    as a child    Family Psychiatric History:   Family History:  Family History  Problem Relation Age of Onset  . Depression Father   . Alcohol abuse Father   . Diabetes Paternal Grandfather   . Heart attack Paternal Grandfather   . Colon cancer Neg Hx     Social History:   Social History   Socioeconomic History  . Marital status: Married    Spouse name: Not on file  . Number of children: 2  . Years of education: 10-  . Highest education level: GED or equivalent  Occupational History  . Not on file  Tobacco Use  . Smoking status: Current Every Day Smoker    Packs/day: 1.00    Years: 18.00    Pack years: 18.00    Types: Cigarettes  . Smokeless tobacco: Never Used  . Tobacco comment: 3 cig daily-04/29/2020  Vaping Use  . Vaping Use: Never used  Substance and Sexual Activity  . Alcohol use: No  . Drug use: No  .  Sexual activity: Yes  Other Topics Concern  . Not on file  Social History Narrative   Married with 2 kids. 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 Strain: Not on [employer] Insecurity: Not on file  Transportation Needs: Not on file  Physical Activity: Not on file  Stress: Not on file  Social Connections: Not on file    Additional Social History:   Allergies:    Allergies  Allergen Reactions  . Tramadol     Jittery    Metabolic Disorder Labs: Lab Results  Component Value Date   HGBA1C 4.9 08/08/2020   No results found for: PROLACTIN Lab Results  Component Value Date   CHOL 210 (H) 01/22/2020   TRIG 221.0 (H) 01/22/2020   HDL 37.20 (L) 01/22/2020   CHOLHDL 6 01/22/2020   VLDL 44.2 (H) 01/22/2020   LDLCALC 163 (H) 08/08/2019   LDLCALC 122 (H) 10/20/2018   Lab Results  Component Value Date   TSH 1.320 08/08/2020    Therapeutic Level Labs: No results found for: LITHIUM No results found for: CBMZ No results found for: VALPROATE  Current Medications: Current Outpatient Medications  Medication Sig Dispense Refill  . albuterol (VENTOLIN HFA) 108 (90 Base) MCG/ACT inhaler Inhale 2 puffs into the lungs every 6 (six) hours as needed for wheezing or shortness of breath. 18 g 0  . Budeson-Glycopyrrol-Formoterol (BREZTRI AEROSPHERE) 160-9-4.8 MCG/ACT AERO Inhale 2 puffs into the lungs in the morning and at bedtime. 5.9 g 0  . buPROPion (WELLBUTRIN SR) 150 MG 12 hr tablet Take 150 mg by mouth 2 (two) times daily.    . clonazePAM (KLONOPIN) 0.5 MG tablet Take 1 tablet (0.5 mg total) by mouth daily as needed for anxiety. 30 tablet 0  . fexofenadine (ALLEGRA ALLERGY) 180 MG tablet Take 1 tablet (180 mg total) by mouth daily. 10 tablet 0  . lamoTRIgine (LAMICTAL) 100 MG tablet Take 1 tablet (100 mg total) by mouth 2 (two) times daily. 180 tablet 0  . lurasidone (LATUDA) 40 MG TABS tablet Take 1 tablet (40 mg total) by mouth daily with breakfast. (Patient taking differently: Take 60 mg by mouth daily with breakfast.) 90 tablet 1  . nicotine (NICODERM CQ) 21 mg/24hr patch Place 1 patch (21 mg total) onto the skin daily. 28 patch 0  . nicotine polacrilex (COMMIT) 4 MG lozenge Take 1 lozenge (4 mg total) by mouth as needed. 108 tablet 3  . QUEtiapine (SEROQUEL) 25 MG tablet Take 25 mg by mouth daily as needed.    . SUMAtriptan (IMITREX) 25 MG tablet  Take 1 tablet (25 mg total) by mouth every 2 (two) hours as needed for migraine (NOT TO EXCEED 100 MG). May repeat in 2 hours if headache persists or recurs. 10 tablet 2  . verapamil (CALAN-SR) 120 MG CR tablet Take 1 tablet (120 mg total) by mouth at bedtime. 30 tablet 2   No current facility-administered medications for this visit.    Musculoskeletal: Strength & Muscle Tone: within normal limits Gait & Station: normal Patient leans: N/A  Psychiatric Specialty Exam: Review of Systems  There were no vitals taken for this visit.There is no height or weight on file to calculate BMI.  General Appearance: Casual  Eye Contact:  Good  Speech:  Clear and Coherent  Volume:  Normal  Mood:  Anxious and Depressed  Affect:  Congruent  Thought Process:  Coherent  Orientation:  Full (Time, Place, and Person)  Thought Content:  Hallucinations: None  Suicidal Thoughts:  No  Homicidal Thoughts:  No  Memory:  Immediate;   Fair Recent;   Fair  Judgement:  Fair  Insight:  Fair  Psychomotor Activity:  Normal  Concentration:  Concentration: Fair  Recall:  [employer] of Knowledge:Fair  Language: Fair  Akathisia:  No  Handed:  Left  AIMS (if indicated):    Assets:  Communication Skills Desire for Improvement Resilience Social Support  ADL's:  Intact  Cognition: WNL  Sleep:  Fair   Screenings: GAD-7   Flowsheet Row Office Visit from 07/22/2020 in [facility] Video Visit from 01/11/2020 in [facility] Video Visit from 07/20/2019 in [facility] Video Visit from 06/01/2019 in [facility] Office Visit from 05/04/2019 in [facility]  Total GAD-7 Score 1 3 6 8 8     PHQ2-9   Flowsheet Row Counselor from 09/01/2020 [facility] Video Visit from 08/15/2020 in [facility] Office Visit from 07/22/2020 in [facility] Video Visit from 01/11/2020 in [facility] Video Visit  from 07/20/2019 in [facility]  PHQ-2 Total Score 4 0 0 0 0  PHQ-9 Total Score 16 0 0 4 5    Flowsheet Row Counselor from 09/01/2020 in [facility] ED from 08/20/2020 in [facility]  C-SSRS RISK CATEGORY Error: Q3, 4, or 5 should not be populated when Q2 is No Low Risk      Assessment and Plan:  Patient to start Intensive Outpatient Programming Continue medications as indicated by outpatient provider -( [facility])   Treatment plan was reviewed and agreed upon by [facility] and patient Baraka Klatt need for group services.    Corwin Levins, NP 6/8/20222:02 PM

## 2020-09-04 ENCOUNTER — Ambulatory Visit (HOSPITAL_COMMUNITY): Payer: 59 | Admitting: Psychiatry

## 2020-09-04 ENCOUNTER — Telehealth (HOSPITAL_COMMUNITY): Payer: Self-pay | Admitting: Psychiatry

## 2020-09-05 ENCOUNTER — Other Ambulatory Visit: Payer: Self-pay

## 2020-09-05 ENCOUNTER — Other Ambulatory Visit (HOSPITAL_COMMUNITY): Payer: 59 | Admitting: Psychiatry

## 2020-09-05 ENCOUNTER — Encounter (HOSPITAL_COMMUNITY): Payer: Self-pay

## 2020-09-05 DIAGNOSIS — F3132 Bipolar disorder, current episode depressed, moderate: Secondary | ICD-10-CM

## 2020-09-05 NOTE — Progress Notes (Signed)
Virtual Visit via Video Note  I connected with Joel Carr on 09/05/20 at  9:00 AM EDT by a video enabled telemedicine application and verified that I am speaking with the correct person using two identifiers.  At orientation to the IOP program, Case Manager discussed the limitations of evaluation and management by telemedicine and the availability of in person appointments. The patient expressed understanding and agreed to proceed with virtual visits throughout the duration of the program.   Location:  Patient: Patient Home Provider: Home Office   History of Present Illness: Bipolar affective DO  Observations/Objective: Check In: Case Manager checked in with all participants to review discharge dates, insurance authorizations, work-related documents and needs from the treatment team regarding medications. Client stated needs and engaged in discussion.   Initial Therapeutic Activity: Counselor facilitated a check-in with group members to assess mood and current functioning. Client shared details of their mental health management since our last session, including challenges and successes. Counselor engaged group in discussion, covering the following topics: coping strategies for work related stress, outdoor activities to combat depression, grounding exercises, assertive communication of needs, changing up application of copings strategies and discharge planning. Client presents with moderate depression and moderate anxiety. Client denied any current SI/HI/psychosis.  Second Therapeutic Activity: Counselor introduced [employer], representative with [employer] to share about programming. Group Members asked questions and engaged in discussion, as [name] shared about Peer Support, Support Groups and the [employer]. Client stated that they are interested in connecting with the peer support.  Check Out: Counselor closed program by allowing time to celebrate 2 graduating group members.  Counselor shared reflections on progress and allow space for group members to share well wishes and encouragements for the graduating client. Counselor prompted graduating client to share takeaways, reflect on progress and final thoughts for the group.Counselor prompted group members to share what self-care practice or productivity activity they will engage in today. Group members shared their plans with the group. Client endorsed safety plan to be followed to prevent safety issues.   Assessment and Plan: Clinician recommends that Client remain in IOP treatment to better manage mental health symptoms, stabilization and to address treatment plan goals. Clinician recommends adherence to crisis/safety plan, taking medications as prescribed, and following up with medical professionals if any issues arise.    Follow Up Instructions: Clinician will send Webex link for next session. The Client was advised to call back or seek an in-person evaluation if the symptoms worsen or if the condition fails to improve as anticipated.     I provided 180 minutes of non-face-to-face time during this encounter.     Hilbert Odor, LCSW

## 2020-09-07 ENCOUNTER — Emergency Department: Payer: Managed Care, Other (non HMO)

## 2020-09-07 ENCOUNTER — Other Ambulatory Visit: Payer: Self-pay

## 2020-09-07 ENCOUNTER — Encounter: Payer: Self-pay | Admitting: Radiology

## 2020-09-07 ENCOUNTER — Emergency Department
Admission: EM | Admit: 2020-09-07 | Discharge: 2020-09-07 | Disposition: A | Discharge status: Home / Self Care | Payer: Managed Care, Other (non HMO) | Attending: Emergency Medicine | Admitting: Emergency Medicine

## 2020-09-07 DIAGNOSIS — W11XXXA Fall on and from ladder, initial encounter: Secondary | ICD-10-CM | POA: Insufficient documentation

## 2020-09-07 DIAGNOSIS — S93325A Dislocation of tarsometatarsal joint of left foot, initial encounter: Secondary | ICD-10-CM

## 2020-09-07 DIAGNOSIS — F17210 Nicotine dependence, cigarettes, uncomplicated: Secondary | ICD-10-CM | POA: Diagnosis not present

## 2020-09-07 DIAGNOSIS — Z7952 Long term (current) use of systemic steroids: Secondary | ICD-10-CM | POA: Diagnosis not present

## 2020-09-07 DIAGNOSIS — Z96651 Presence of right artificial knee joint: Secondary | ICD-10-CM | POA: Diagnosis not present

## 2020-09-07 DIAGNOSIS — S92342A Displaced fracture of fourth metatarsal bone, left foot, initial encounter for closed fracture: Secondary | ICD-10-CM | POA: Diagnosis not present

## 2020-09-07 DIAGNOSIS — Z8616 Personal history of COVID-19: Secondary | ICD-10-CM | POA: Diagnosis not present

## 2020-09-07 DIAGNOSIS — S92322A Displaced fracture of second metatarsal bone, left foot, initial encounter for closed fracture: Secondary | ICD-10-CM | POA: Insufficient documentation

## 2020-09-07 DIAGNOSIS — J45909 Unspecified asthma, uncomplicated: Secondary | ICD-10-CM | POA: Diagnosis not present

## 2020-09-07 DIAGNOSIS — S99922A Unspecified injury of left foot, initial encounter: Secondary | ICD-10-CM | POA: Diagnosis present

## 2020-09-07 MED ORDER — OXYCODONE-ACETAMINOPHEN 5-325 MG PO TABS: 1.0000 | ORAL_TABLET | Freq: Three times a day (TID) | ORAL | 0 refills | Status: AC | PRN

## 2020-09-07 MED ORDER — FENTANYL CITRATE (PF) 100 MCG/2ML IJ SOLN
100.0000 ug | Freq: Once | INTRAMUSCULAR | Status: AC
  Administered 2020-09-07: 100 ug via INTRAMUSCULAR
  Filled 2020-09-07: qty 2

## 2020-09-07 NOTE — ED Notes (Signed)
Patient verbalizes understanding of discharge instructions. Opportunity for questioning and answers were provided. Armband removed by staff, pt discharged from ED. Ambulated out to lobby  

## 2020-09-07 NOTE — ED Provider Notes (Signed)
[facility]-EMERGENCY DEPARTMENT  ____________________________________________  Time seen: Approximately 11:22 PM  I have reviewed the triage vital signs and the nursing notes.   HISTORY  Chief Complaint Foot Injury   Historian Patient   HPI Joel Carr is a 35 y.o. male presents to the emergency department with acute left foot pain after patient fell approximately 4 feet from a ladder while taking apart a swing set.  Patient has been unable to bear weight since injury occurred.  No numbness or tingling of the left lower extremity.  No abrasions or lacerations.  Patient denies hitting his head or his neck.  No weakness in the upper extremities.   Past Medical History:  Diagnosis Date   Anxiety    Bipolar disorder (HCC)    COVID-19 virus infection 12/23/2019   mild congestion and body aches   Depression    Hyperlipidemia    RLS (restless legs syndrome)      Immunizations up to date:  Yes.     Past Medical History:  Diagnosis Date   Anxiety    Bipolar disorder (HCC)    COVID-19 virus infection 12/23/2019   mild congestion and body aches   Depression    Hyperlipidemia    RLS (restless legs syndrome)     Patient Active Problem List   Diagnosis Date Noted   Former smoker 04/29/2020   Current every day smoker 04/29/2020   Other emphysema (HCC) 04/29/2020   Body aches after vaccination 02/12/2020   DOE (dyspnea on exertion) 01/23/2020   History of COVID-19 01/23/2020   Abnormal CXR 01/23/2020   History of asthma 01/23/2020   Acute non-recurrent maxillary sinusitis 10/04/2019   Finger pain, left 07/17/2019   Flank pain 05/08/2019   Tension headache 05/08/2019   Anxiety 10/23/2018   GERD (gastroesophageal reflux disease) 07/07/2018   Insomnia 07/07/2018   Nodule of skin of abdomen 02/08/2018   Impingement syndrome of shoulder region 10/10/2017   RLS (restless legs syndrome) 02/02/2017   Mixed hyperlipidemia 04/10/2015   Bipolar disorder (HCC) 01/01/2015     Past Surgical History:  Procedure Laterality Date   KNEE ARTHROSCOPY WITH MENISCAL REPAIR Right 10/29/2014   Procedure: KNEE ARTHROSCOPY WITH MENISCAL REPAIR;  Surgeon: Kennedy Bucker, MD;  Location: [facility] ORS;  Service: Orthopedics;  Laterality: Right;   KNEE SURGERY Right    TESTICLE SURGERY N/A    as a child    Prior to Admission medications   Medication Sig Start Date End Date Taking? Authorizing Provider  oxyCODONE-acetaminophen (PERCOCET/ROXICET) 5-325 MG tablet Take 1 tablet by mouth every 8 (eight) hours as needed for up to 5 days. 09/07/20 09/12/20 Yes Pia Mau M, PA-C  albuterol (VENTOLIN HFA) 108 (90 Base) MCG/ACT inhaler Inhale 2 puffs into the lungs every 6 (six) hours as needed for wheezing or shortness of breath. 11/30/18   Particia Nearing, PA-C  Budeson-Glycopyrrol-Formoterol (BREZTRI AEROSPHERE) 160-9-4.8 MCG/ACT AERO Inhale 2 puffs into the lungs in the morning and at bedtime. 04/29/20   Coral Ceo, NP  buPROPion (WELLBUTRIN SR) 150 MG 12 hr tablet Take 150 mg by mouth 2 (two) times daily. 04/18/20   [provider]  clonazePAM (KLONOPIN) 0.5 MG tablet Take 1 tablet (0.5 mg total) by mouth daily as needed for anxiety. 01/14/20   Theadore Nan, NP  fexofenadine [facility] ALLERGY) 180 MG tablet Take 1 tablet (180 mg total) by mouth daily. 08/15/20   Vigg, Avanti, MD  lamoTRIgine (LAMICTAL) 100 MG tablet Take 1 tablet (100 mg total)  by mouth 2 (two) times daily. 01/14/20   Theadore Nan, NP  lurasidone (LATUDA) 40 MG TABS tablet Take 1 tablet (40 mg total) by mouth daily with breakfast. Patient taking differently: Take 60 mg by mouth daily with breakfast. 01/17/20   Theadore Nan, NP  nicotine (NICODERM CQ) 21 mg/24hr patch Place 1 patch (21 mg total) onto the skin daily. 08/15/20   Vigg, Avanti, MD  nicotine polacrilex (COMMIT) 4 MG lozenge Take 1 lozenge (4 mg total) by mouth as needed. 04/29/20   Coral Ceo, NP  QUEtiapine (SEROQUEL) 25 MG tablet  Take 25 mg by mouth daily as needed. 02/08/20   [provider]  SUMAtriptan (IMITREX) 25 MG tablet Take 1 tablet (25 mg total) by mouth every 2 (two) hours as needed for migraine (NOT TO EXCEED 100 MG). May repeat in 2 hours if headache persists or recurs. 07/22/20   Vigg, Avanti, MD  verapamil (CALAN-SR) 120 MG CR tablet Take 1 tablet (120 mg total) by mouth at bedtime. 07/22/20   Loura Pardon, MD    Allergies Tramadol  Family History  Problem Relation Age of Onset   Depression Father    Alcohol abuse Father    Diabetes Paternal Grandfather    Heart attack Paternal Grandfather    Colon cancer Neg Hx     Social History Social History   Tobacco Use   Smoking status: Every Day    Packs/day: 1.00    Years: 18.00    Pack years: 18.00    Types: Cigarettes   Smokeless tobacco: Never   Tobacco comments:    3 cig daily-04/29/2020  Vaping Use   Vaping Use: Never used  Substance Use Topics   Alcohol use: No   Drug use: No     Review of Systems  Constitutional: No fever/chills Eyes:  No discharge ENT: No upper respiratory complaints. Respiratory: no cough. No SOB/ use of accessory muscles to breath Gastrointestinal:   No nausea, no vomiting.  No diarrhea.  No constipation. Musculoskeletal: Patient has left foot pain.  Skin: Negative for rash, abrasions, lacerations, ecchymosis.   ____________________________________________   PHYSICAL EXAM:  VITAL SIGNS: ED Triage Vitals  Enc Vitals Group     BP 09/07/20 1910 (!) 162/70     Pulse Rate 09/07/20 1910 74     Resp 09/07/20 1910 16     Temp 09/07/20 1910 98.4 F (36.9 C)     Temp src --      SpO2 09/07/20 1910 97 %     Weight 09/07/20 1911 165 lb (74.8 kg)     Height 09/07/20 1911 5\' 8"  (1.727 m)     Head Circumference --      Peak Flow --      Pain Score 09/07/20 1910 10     Pain Loc --      Pain Edu? --      Excl. in GC? --      Constitutional: Alert and oriented. Well appearing and in no acute  distress. Eyes: Conjunctivae are normal. PERRL. EOMI. Head: Atraumatic. ENT:      Nose: No congestion/rhinnorhea.      Mouth/Throat: Mucous membranes are moist.  Neck: No stridor.  No cervical spine tenderness to palpation. Cardiovascular: Normal rate, regular rhythm. Normal S1 and S2.  Good peripheral circulation. Respiratory: Normal respiratory effort without tachypnea or retractions. Lungs CTAB. Good air entry to the bases with no decreased or absent breath sounds Gastrointestinal: Bowel sounds x 4  quadrants. Soft and nontender to palpation. No guarding or rigidity. No distention. Musculoskeletal: Patient can perform full range of motion at the left ankle.  Patient has tenderness to palpation along the dorsal aspect of the left foot.  Palpable dorsalis pedis pulse bilaterally and symmetrically.  Capillary refill less than 2 seconds on the left. Neurologic:  Normal for age. No gross focal neurologic deficits are appreciated.  Skin:  Skin is warm, dry and intact. No rash noted. Psychiatric: Mood and affect are normal for age. Speech and behavior are normal.   ____________________________________________   LABS (all labs ordered are listed, but only abnormal results are displayed)  Labs Reviewed - No data to display ____________________________________________  EKG   ____________________________________________  RADIOLOGY Geraldo PitterI, Evynn Boutelle M Jaysion Ramseyer, personally viewed and evaluated these images (plain radiographs) as part of my medical decision making, as well as reviewing the written report by the radiologist.  CT Foot Left Wo Contrast  Result Date: 09/07/2020 CLINICAL DATA:  Fall off ladder. EXAM: CT OF THE LEFT FOOT WITHOUT CONTRAST TECHNIQUE: Multidetector CT imaging of the left foot was performed according to the standard protocol. Multiplanar CT image reconstructions were also generated. COMPARISON:  None. FINDINGS: Lisfranc fracture dislocation noted involving the 2nd through 4th  proximal metatarsals. There is lateral displacement/dislocation of the metatarsals relative to the tarsal bones. Comminuted fractures noted at the bases of the 2nd through 4th metatarsals. Fracture noted through the distal lateral corner of the cuboid. No other fracture seen. IMPRESSION: Lisfranc injury with fracture dislocations of the 2nd through 4th metatarsals and 2nd through 4th tarsal metatarsal joints. Fracture through the distal corner of the cuboid. Electronically Signed   By: Charlett NoseKevin  Dover M.D.   On: 09/07/2020 21:41   DG Foot Complete Left  Result Date: 09/07/2020 CLINICAL DATA:  Fall off ladder EXAM: LEFT FOOT - COMPLETE 3+ VIEW COMPARISON:  None. FINDINGS: There is an oblique displaced fracture through the proximal to mid shaft of the left 3rd metatarsal. Fractures also noted within the proximal 4th metatarsal. There appears to be dislocation of the 2nd through 4th tarsal metatarsal joints. Findings compatible with Lisfranc injury. IMPRESSION: Lisfranc injury as described above. Fractures involving the 3rd and 4th metatarsals with dislocations of the 2nd through 4th tarsal metatarsal joints. Electronically Signed   By: Charlett NoseKevin  Dover M.D.   On: 09/07/2020 19:56    ____________________________________________    PROCEDURES  Procedure(s) performed:     Procedures     Medications  fentaNYL (SUBLIMAZE) injection 100 mcg (100 mcg Intramuscular Given 09/07/20 2206)     ____________________________________________   INITIAL IMPRESSION / ASSESSMENT AND PLAN / ED COURSE  Pertinent labs & imaging results that were available during my care of the patient were reviewed by me and considered in my medical decision making (see chart for details).      Assessment and Plan:  Foot pain [age] year old male presents to the emergency department with acute left foot pain after a fall.  Patient fell approximately 4 feet.   Patient has Lisfranc injury of the left foot indicated on CT with  fractures of the 2nd through 4th metatarsals with associated dislocations.  Reached out to podiatrist on-call, Dr. Alberteen Spindleline who recommended splinting, nonweightbearing status and follow-up as an outpatient.  Patient education regarding follow-up instructions were given to patient and his wife. Patient was prescribed Percocet for pain.  Return precautions were given to return with new or worsening symptoms.    ____________________________________________  FINAL CLINICAL IMPRESSION(S) / ED DIAGNOSES  Final diagnoses:  Lisfranc dislocation, left, initial encounter      NEW MEDICATIONS STARTED DURING THIS VISIT:  ED Discharge Orders          Ordered    oxyCODONE-acetaminophen (PERCOCET/ROXICET) 5-325 MG tablet  Every 8 hours PRN        09/07/20 2245                This chart was dictated using voice recognition software/Dragon. Despite best efforts to proofread, errors can occur which can change the meaning. Any change was purely unintentional.     Gasper Lloyd 09/07/20 [id number]    Phineas Semen, MD 09/07/20 2337

## 2020-09-07 NOTE — ED Triage Notes (Signed)
Pt states fell off a ladder injuring left foot approx one hour pta, pt denies other injuries. Pt with swelling noted to medial aspect of foot.

## 2020-09-07 NOTE — Discharge Instructions (Addendum)
You can take Percocet for pain. Please follow-up with Dr. Excell Seltzer

## 2020-09-07 NOTE — ED Notes (Signed)
Pt refused crutches, has some at home already.

## 2020-09-08 ENCOUNTER — Ambulatory Visit (HOSPITAL_COMMUNITY): Payer: 59 | Admitting: Psychiatry

## 2020-09-08 ENCOUNTER — Telehealth (HOSPITAL_COMMUNITY): Payer: Self-pay | Admitting: Psychiatry

## 2020-09-08 NOTE — Telephone Encounter (Signed)
D:  Pt states he was in an accident yesterday and broke his left foot; has a doctor's appt today at 3:30 pm.  States he will call after the appt to inform case manager about his return to [facility].  A:  Inform treatment team.

## 2020-09-09 ENCOUNTER — Other Ambulatory Visit: Payer: Self-pay | Admitting: Podiatry

## 2020-09-09 ENCOUNTER — Encounter (HOSPITAL_COMMUNITY): Payer: Self-pay

## 2020-09-09 ENCOUNTER — Other Ambulatory Visit: Payer: Self-pay

## 2020-09-09 ENCOUNTER — Other Ambulatory Visit (HOSPITAL_COMMUNITY): Payer: 59 | Admitting: Psychiatry

## 2020-09-09 DIAGNOSIS — F3132 Bipolar disorder, current episode depressed, moderate: Secondary | ICD-10-CM

## 2020-09-09 NOTE — Progress Notes (Signed)
Virtual Visit via Video Note  I connected with Joel Carr on 09/09/20 at  9:00 AM EDT by a video enabled telemedicine application and verified that I am speaking with the correct person using two identifiers.  At orientation to the IOP program, Case Manager discussed the limitations of evaluation and management by telemedicine and the availability of in person appointments. The patient expressed understanding and agreed to proceed with virtual visits throughout the duration of the program.   Location:  Patient: Patient Home Provider: Home Office   History of Present Illness: Bipolar Affective DO  Observations/Objective: Check In: Case Manager checked in with all participants to review discharge dates, insurance authorizations, work-related documents and needs from the treatment team regarding medications. Client stated needs and engaged in discussion.   Initial Therapeutic Activity: Counselor facilitated a check-in with group members to assess mood and current functioning. Client shared details of their mental health management since our last session, including challenges and successes. Counselor engaged group in discussion, covering the following topics: substance abuse impacting mental health, work related stress, vocations that support mental health, financial stressors, setting boundaries, navigating stages of life, and updates on coping skills used since last session. Client presents with moderate depression and moderate anxiety. Client denied any current SI/HI/psychosis.  Second Therapeutic Activity: Counselor presented information on Cognitive Distortions. Client shared a video outlining 15 styles of cognitive distortions. Group members shared which ones they do most in their thought processes. Counselor shared 5 strategies for combating distorted thinking. Counselor shared links for resources for Group Members to review for homework.  Check Out: Counselor closed program by allowing  time to celebrate a graduating group member. Counselor shared reflections on progress and allow space for group members to share well wishes and encouragements with the graduating client. Counselor prompted graduating client to share takeaways, reflect on progress and final thoughts for the group. Counselor prompted group members to share what self-care practice or productivity activity they will engage in today. Group members shared their plans with the group. Client endorsed safety plan to be followed to prevent safety issues.   Assessment and Plan: Clinician recommends that Client remain in IOP treatment to better manage mental health symptoms, stabilization and to address treatment plan goals. Clinician recommends adherence to crisis/safety plan, taking medications as prescribed, and following up with medical professionals if any issues arise.    Follow Up Instructions: Clinician will send Webex link for next session. The Client was advised to call back or seek an in-person evaluation if the symptoms worsen or if the condition fails to improve as anticipated.     I provided 180 minutes of non-face-to-face time during this encounter.     Hilbert Odor, LCSW

## 2020-09-10 ENCOUNTER — Encounter (HOSPITAL_COMMUNITY): Payer: Self-pay

## 2020-09-10 ENCOUNTER — Other Ambulatory Visit: Payer: Self-pay

## 2020-09-10 ENCOUNTER — Other Ambulatory Visit (HOSPITAL_COMMUNITY): Payer: 59 | Admitting: Psychiatry

## 2020-09-10 DIAGNOSIS — F3132 Bipolar disorder, current episode depressed, moderate: Secondary | ICD-10-CM | POA: Diagnosis not present

## 2020-09-10 NOTE — Progress Notes (Signed)
Virtual Visit via Video Note  I connected with Joel Carr on 09/10/20 at  9:00 AM EDT by a video enabled telemedicine application and verified that I am speaking with the correct person using two identifiers.  At orientation to the IOP program, Case Manager discussed the limitations of evaluation and management by telemedicine and the availability of in person appointments. The patient expressed understanding and agreed to proceed with virtual visits throughout the duration of the program.   Location:  Patient: Patient Home Provider: Home Office   History of Present Illness: Bipolar Affective DO  Observations/Objective: Check In: Case Manager checked in with all participants to review discharge dates, insurance authorizations, work-related documents and needs from the treatment team regarding medications. Client stated needs and engaged in discussion. Client presents with moderate depression and moderate anxiety. Client denied any current SI/HI/psychosis.  Initial Therapeutic Activity: Counselor introduced our guest speaker, Peggye Fothergill, [facility] Pharmacist, who shared about psychiatric medications, side effects, treatment considerations and how to communicate with medical professionals. Group Members asked questions and shared medication concerns. Counselor prompted group members to reference a worksheet called, "Body Scan" to jot down questions and concerns about their physical health in preparation for their upcoming appointments with medical professionals. Counselor encouraged routine medical check-ups, preparing for appointments, following up with recommendations and seeking specialist if needed.  Second Therapeutic Activity: Counselor introduced Chief Operating Officer, Clyda Greener, Director of Wellness to present information on [employer] and Benefits. Clients engaged in activities and discussion, personalizing the information to their individual circumstances. Client motivated to make small  changes in wellness practices to improve overall mental health.  Check Out: Counselor prompted group members to share what self-care practice or productivity activity they will engage in today. Group members shared their plans with the group. Client endorsed safety plan to be followed to prevent safety issues.   Assessment and Plan: Clinician recommends that Client remain in IOP treatment to better manage mental health symptoms, stabilization and to address treatment plan goals. Clinician recommends adherence to crisis/safety plan, taking medications as prescribed, and following up with medical professionals if any issues arise.    Follow Up Instructions: Clinician will send Webex link for next session. The Client was advised to call back or seek an in-person evaluation if the symptoms worsen or if the condition fails to improve as anticipated.     I provided 180 minutes of non-face-to-face time during this encounter.     Hilbert Odor, LCSW

## 2020-09-11 ENCOUNTER — Other Ambulatory Visit (HOSPITAL_COMMUNITY): Payer: 59 | Admitting: Psychiatry

## 2020-09-11 ENCOUNTER — Encounter (HOSPITAL_COMMUNITY): Payer: Self-pay

## 2020-09-11 ENCOUNTER — Other Ambulatory Visit: Payer: Self-pay

## 2020-09-11 DIAGNOSIS — F3132 Bipolar disorder, current episode depressed, moderate: Secondary | ICD-10-CM

## 2020-09-11 NOTE — Progress Notes (Signed)
Virtual Visit via Video Note  I connected with Joel Carr on 09/11/20 at  9:00 AM EDT by a video enabled telemedicine application and verified that I am speaking with the correct person using two identifiers.  At orientation to the IOP program, Case Manager discussed the limitations of evaluation and management by telemedicine and the availability of in person appointments. The patient expressed understanding and agreed to proceed with virtual visits throughout the duration of the program.   Location:  Patient: Patient Home Provider: Home Office   History of Present Illness: Bipolar Affective DO  Observations/Objective: Check In: Case Manager checked in with all participants to review discharge dates, insurance authorizations, work-related documents and needs from the treatment team regarding medications. Client stated needs and engaged in discussion.   Initial Therapeutic Activity: Counselor introduced Sheppard Coil, [place] Chaplain to present information and discussion on Grief and Loss. Group members engaged in discussion, sharing how grief impacts them, what comforts them, what emotions are felt, labeling losses, etc. After guest speaker logged off, Counselor prompted group to spend 15 minutes journaling to process personal grief and loss situations. Counselor processed entries with group and client's identified areas for additional processing in individual therapy.   Second Therapeutic Activity: Counselor facilitated a check-in with group members to assess mood and current functioning. Client shared details of their mental health management since our last session, including challenges and successes. Counselor engaged group in discussion, covering the following topics: health and wellness goals, community resources, distraction activities, reducing unhealthy habits, self-care, and structuring day for wellness. Client presents with moderate depression and moderate anxiety. Client denied any  current SI/HI/psychosis.  Check Out: Counselor prompted group members to share what self-care practice or productivity activity they will engage in today. Group members shared their plans with the group. Client endorsed safety plan to be followed to prevent safety issues.   Assessment and Plan: Clinician recommends that Client remain in IOP treatment to better manage mental health symptoms, stabilization and to address treatment plan goals. Clinician recommends adherence to crisis/safety plan, taking medications as prescribed, and following up with medical professionals if any issues arise.    Follow Up Instructions: Clinician will send Webex link for next session. The Client was advised to call back or seek an in-person evaluation if the symptoms worsen or if the condition fails to improve as anticipated.     I provided 180 minutes of non-face-to-face time during this encounter.     Joel Odor, LCSW

## 2020-09-12 ENCOUNTER — Other Ambulatory Visit: Payer: Self-pay

## 2020-09-12 ENCOUNTER — Other Ambulatory Visit (HOSPITAL_COMMUNITY): Payer: 59 | Admitting: Psychiatry

## 2020-09-12 ENCOUNTER — Encounter (HOSPITAL_COMMUNITY): Payer: Self-pay

## 2020-09-12 DIAGNOSIS — F3132 Bipolar disorder, current episode depressed, moderate: Secondary | ICD-10-CM

## 2020-09-12 NOTE — Progress Notes (Signed)
Virtual Visit via Video Note  I connected with Joel Carr on 09/12/20 at  9:00 AM EDT by a video enabled telemedicine application and verified that I am speaking with the correct person using two identifiers.  At orientation to the IOP program, Case Manager discussed the limitations of evaluation and management by telemedicine and the availability of in person appointments. The patient expressed understanding and agreed to proceed with virtual visits throughout the duration of the program.   Location:  Patient: Patient Home Provider: Home Office   History of Present Illness: Bipolar DO  Observations/Objective: Check In: Case Manager checked in with all participants to review discharge dates, insurance authorizations, work-related documents and needs from the treatment team regarding medications. Client stated needs and engaged in discussion.   Initial Therapeutic Activity: Counselor facilitated a check-in with group members to assess mood and current functioning. Client shared details of their mental health management since our last session, including challenges and successes. Counselor engaged group in discussion, covering the following topics: sleep hygiene, utilization of coping skills, meditation, mindful walks, yoga, accountability, talking with providers, self-care guilt, ECT vs TMS, substance use and its impact on mental health, and setting boundaries. Client presents with moderate depression and moderate anxiety. Client denied any current SI/HI/psychosis.   Second Therapeutic Activity: Counselor opened discussion and explored challenges and thoughts around Father's Day/relationship with fathers. Group members took time to journal thoughts and feelings. Counselor prompted group members to process aloud, discussing ways to manage grief and loss, celebrate relationships and attend to feelings/memories that arise.   Check Out: Counselor prompted group members to share what self-care  practice or productivity activity they will engage in over the weekend. Group members shared their plans with the group. Client endorsed safety plan to be followed to prevent safety issues.   Assessment and Plan: Clinician recommends that Client remain in IOP treatment to better manage mental health symptoms, stabilization and to address treatment plan goals. Clinician recommends adherence to crisis/safety plan, taking medications as prescribed, and following up with medical professionals if any issues arise.    Follow Up Instructions: Clinician will send Webex link for next session. The Client was advised to call back or seek an in-person evaluation if the symptoms worsen or if the condition fails to improve as anticipated.     I provided 180 minutes of non-face-to-face time during this encounter.     Hilbert Odor, LCSW

## 2020-09-15 ENCOUNTER — Other Ambulatory Visit (HOSPITAL_COMMUNITY): Payer: 59 | Admitting: Psychiatry

## 2020-09-15 ENCOUNTER — Encounter (HOSPITAL_COMMUNITY): Payer: Self-pay

## 2020-09-15 ENCOUNTER — Other Ambulatory Visit: Payer: Self-pay

## 2020-09-15 ENCOUNTER — Encounter
Admission: RE | Admit: 2020-09-15 | Discharge: 2020-09-15 | Disposition: A | Discharge status: Home / Self Care | Payer: Managed Care, Other (non HMO) | Source: Ambulatory Visit | Attending: Podiatry | Admitting: Podiatry

## 2020-09-15 DIAGNOSIS — F3132 Bipolar disorder, current episode depressed, moderate: Secondary | ICD-10-CM | POA: Diagnosis not present

## 2020-09-15 HISTORY — DX: Pneumonia, unspecified organism: J18.9

## 2020-09-15 HISTORY — DX: Personal history of urinary calculi: Z87.442

## 2020-09-15 NOTE — Progress Notes (Signed)
Virtual Visit via Video Note  I connected with Joel Carr on 09/15/20 at 12:00 PM EDT by a video enabled telemedicine application and verified that I am speaking with the correct person using two identifiers.  At orientation to the IOP program, Case Manager discussed the limitations of evaluation and management by telemedicine and the availability of in person appointments. The patient expressed understanding and agreed to proceed with virtual visits throughout the duration of the program.   Location:  Patient: Patient Home Provider: Home Office   History of Present Illness: Bipolar 1 DO  Observations/Objective: Check In: Case Manager checked in with all participants to review discharge dates, insurance authorizations, work-related documents and needs from the treatment team regarding medications. Client stated needs and engaged in discussion.   Initial Therapeutic Activity: Counselor facilitated a check-in with group members to assess mood and current functioning. Client shared details of their mental health management since our last session, including challenges and successes. Counselor engaged group in discussion, covering the following topics: father's day grief responses and reminders, TIPP DBT skills, flashbacks, memory loss issues, skill application, and using art in coping. Client presents with moderate depression and moderate anxiety. Client denied any current SI/HI/psychosis.   Second Therapeutic Activity: Counselor engaged group in the creation of their current ECOmap, in order to have a snapshot of support systems and relationship with entities/individuals involved in. Counselor walked group through the steps of the activity creating dynamic lines, energy lines and boundary types. Group then shared reflections and results of their work. Client shared and identified 2-3 small goals they would like in addressing issues to reduce stress and stabilize relationships.   Check Out:  Counselor closed program by allowing time to celebrate a graduating group member. Counselor shared reflections on progress and allow space for group members to share well wishes and encouragements with the graduating client. Counselor prompted graduating client to share takeaways, reflect on progress and final thoughts for the group. Counselor prompted group members to share what self-care practice or productivity activity they will engage in today. Group members shared their plans with the group. Client endorsed safety plan to be followed to prevent safety issues.   Assessment and Plan: Clinician recommends that Client remain in IOP treatment to better manage mental health symptoms, stabilization and to address treatment plan goals. Clinician recommends adherence to crisis/safety plan, taking medications as prescribed, and following up with medical professionals if any issues arise.    Follow Up Instructions: Clinician will send Webex link for next session. The Client was advised to call back or seek an in-person evaluation if the symptoms worsen or if the condition fails to improve as anticipated.     I provided 180 minutes of non-face-to-face time during this encounter.     Hilbert Odor, LCSW

## 2020-09-15 NOTE — Patient Instructions (Addendum)
Your procedure is scheduled on: Friday, June 24 Report to the Registration Desk on the 1st floor of the [facility] To find out your arrival time, please call [phone] between 1PM - 3PM on: Thursday, June 23  REMEMBER: Instructions that are not followed completely may result in serious medical risk, up to and including death; or upon the discretion of your surgeon and anesthesiologist your surgery may need to be rescheduled.  Do not eat food after midnight the night before surgery.  No gum chewing, lozengers or hard candies.  You may however, drink CLEAR liquids up to 2 hours before you are scheduled to arrive for your surgery. Do not drink anything within 2 hours of your scheduled arrival time.  Clear liquids include: - water  - apple juice without pulp - gatorade (not RED, PURPLE, OR BLUE) - black coffee or tea (Do NOT add milk or creamers to the coffee or tea) Do NOT drink anything that is not on this list.  In addition, your doctor has ordered for you to drink the provided  Ensure Pre-Surgery Clear Carbohydrate Drink  Drinking this carbohydrate drink up to two hours before surgery helps to reduce insulin resistance and improve patient outcomes. Please complete drinking 2 hours prior to scheduled arrival time.  TAKE THESE MEDICATIONS THE MORNING OF SURGERY WITH A SIP OF WATER:  Albuterol inhaler Lamotriguine (Lamictal) Oxycodone or tylenol if needed for pain  Use inhalers on the day of surgery and bring to the hospital.  One week prior to surgery: starting June 17 Stop Anti-inflammatories (NSAIDS) such as Advil, Aleve, Ibuprofen, Motrin, Naproxen, Naprosyn and Aspirin based products such as Excedrin, Goodys Powder, BC Powder. Stop ANY OVER THE COUNTER supplements until after surgery. (Multi-vitamin) You may however, continue to take Tylenol if needed for pain up until the day of surgery.  No Alcohol for 24 hours before or after surgery.  No Smoking including  e-cigarettes for 24 hours prior to surgery.  No chewable tobacco products for at least 6 hours prior to surgery.  No nicotine patches on the day of surgery.  Do not use any "recreational" drugs for at least a week prior to your surgery.  Please be advised that the combination of cocaine and anesthesia may have negative outcomes, up to and including death. If you test positive for cocaine, your surgery will be cancelled.  On the morning of surgery brush your teeth with toothpaste and water, you may rinse your mouth with mouthwash if you wish. Do not swallow any toothpaste or mouthwash.  Do not wear jewelry.  Do not wear lotions, powders, or perfumes.   Do not shave body from the neck down 48 hours prior to surgery just in case you cut yourself which could leave a site for infection.  Also, freshly shaved skin may become irritated if using the CHG soap.  Do not bring valuables to the hospital. [facility] is not responsible for any missing/lost belongings or valuables.   Use CHG wipes as directed on instruction sheet.  Notify your doctor if there is any change in your medical condition (cold, fever, infection).  Wear comfortable clothing (specific to your surgery type) to the hospital.  After surgery, you can help prevent lung complications by doing breathing exercises.  Take deep breaths and cough every 1-2 hours. Your doctor may order a device called an Incentive Spirometer to help you take deep breaths.  If you are being discharged the day of surgery, you will not be allowed  to drive home. You will need a responsible adult (18 years or older) to drive you home and stay with you that night.   If you are taking public transportation, you will need to have a responsible adult (18 years or older) with you. Please confirm with your physician that it is acceptable to use public transportation.   Please call the Pre-admissions Testing Dept. at [phone] if you have any questions  about these instructions.  Surgery Visitation Policy:  Patients undergoing a surgery or procedure may have one family member or support person with them as long as that person is not COVID-19 positive or experiencing its symptoms.  That person may remain in the waiting area during the procedure.

## 2020-09-16 ENCOUNTER — Other Ambulatory Visit: Payer: Self-pay

## 2020-09-16 ENCOUNTER — Other Ambulatory Visit (HOSPITAL_COMMUNITY): Payer: 59

## 2020-09-17 ENCOUNTER — Ambulatory Visit (HOSPITAL_COMMUNITY): Payer: Managed Care, Other (non HMO)

## 2020-09-18 ENCOUNTER — Other Ambulatory Visit: Payer: Self-pay

## 2020-09-18 ENCOUNTER — Other Ambulatory Visit (HOSPITAL_COMMUNITY): Payer: 59 | Admitting: Psychiatry

## 2020-09-18 DIAGNOSIS — F3132 Bipolar disorder, current episode depressed, moderate: Secondary | ICD-10-CM

## 2020-09-18 MED ORDER — POVIDONE-IODINE 7.5 % EX SOLN
Freq: Once | CUTANEOUS | Status: DC
  Filled 2020-09-18: qty 118

## 2020-09-18 MED ORDER — CEFAZOLIN SODIUM-DEXTROSE 2-4 GM/100ML-% IV SOLN
2.0000 g | INTRAVENOUS | Status: AC
  Administered 2020-09-19: 2 g via INTRAVENOUS

## 2020-09-18 MED ORDER — ORAL CARE MOUTH RINSE: 15.0000 mL | Freq: Once | OROMUCOSAL | Status: AC

## 2020-09-18 MED ORDER — LACTATED RINGERS IV SOLN: INTRAVENOUS | Status: DC

## 2020-09-18 MED ORDER — FAMOTIDINE 20 MG PO TABS: 20.0000 mg | ORAL_TABLET | Freq: Once | ORAL | Status: AC

## 2020-09-18 MED ORDER — CHLORHEXIDINE GLUCONATE 0.12 % MT SOLN: 15.0000 mL | Freq: Once | OROMUCOSAL | Status: AC

## 2020-09-19 ENCOUNTER — Encounter
Admission: RE | Disposition: A | Discharge status: Home / Self Care | Payer: Self-pay | Source: Home / Self Care | Attending: Podiatry

## 2020-09-19 ENCOUNTER — Ambulatory Visit: Payer: Managed Care, Other (non HMO) | Admitting: Anesthesiology

## 2020-09-19 ENCOUNTER — Encounter: Payer: Self-pay | Admitting: Podiatry

## 2020-09-19 ENCOUNTER — Ambulatory Visit: Payer: Managed Care, Other (non HMO)

## 2020-09-19 ENCOUNTER — Ambulatory Visit
Admission: RE | Admit: 2020-09-19 | Discharge: 2020-09-19 | Disposition: A | Discharge status: Home / Self Care | Payer: Managed Care, Other (non HMO) | Attending: Podiatry | Admitting: Podiatry

## 2020-09-19 ENCOUNTER — Encounter (HOSPITAL_COMMUNITY): Payer: Self-pay

## 2020-09-19 DIAGNOSIS — W11XXXA Fall on and from ladder, initial encounter: Secondary | ICD-10-CM | POA: Diagnosis not present

## 2020-09-19 DIAGNOSIS — S92322A Displaced fracture of second metatarsal bone, left foot, initial encounter for closed fracture: Secondary | ICD-10-CM | POA: Diagnosis not present

## 2020-09-19 DIAGNOSIS — S93312A Subluxation of tarsal joint of left foot, initial encounter: Secondary | ICD-10-CM | POA: Insufficient documentation

## 2020-09-19 DIAGNOSIS — S93325D Dislocation of tarsometatarsal joint of left foot, subsequent encounter: Secondary | ICD-10-CM

## 2020-09-19 DIAGNOSIS — S92342A Displaced fracture of fourth metatarsal bone, left foot, initial encounter for closed fracture: Secondary | ICD-10-CM | POA: Diagnosis not present

## 2020-09-19 DIAGNOSIS — S92332A Displaced fracture of third metatarsal bone, left foot, initial encounter for closed fracture: Secondary | ICD-10-CM | POA: Insufficient documentation

## 2020-09-19 DIAGNOSIS — Z419 Encounter for procedure for purposes other than remedying health state, unspecified: Secondary | ICD-10-CM

## 2020-09-19 DIAGNOSIS — S93325A Dislocation of tarsometatarsal joint of left foot, initial encounter: Secondary | ICD-10-CM | POA: Insufficient documentation

## 2020-09-19 DIAGNOSIS — S92352A Displaced fracture of fifth metatarsal bone, left foot, initial encounter for closed fracture: Secondary | ICD-10-CM | POA: Insufficient documentation

## 2020-09-19 DIAGNOSIS — S92215A Nondisplaced fracture of cuboid bone of left foot, initial encounter for closed fracture: Secondary | ICD-10-CM | POA: Diagnosis not present

## 2020-09-19 HISTORY — PX: OPEN REDUCTION INTERNAL FIXATION (ORIF) FOOT LISFRANC FRACTURE: SHX5990

## 2020-09-19 HISTORY — PX: ARTHRODESIS METATARSAL: SHX6565

## 2020-09-19 HISTORY — PX: FOOT ARTHRODESIS: SHX1655

## 2020-09-19 SURGERY — FUSION, JOINT, INVOLVING METATARSAL BONE
Anesthesia: General | Site: Toe | Laterality: Left

## 2020-09-19 MED ORDER — FENTANYL CITRATE (PF) 100 MCG/2ML IJ SOLN
INTRAMUSCULAR | Status: AC
  Administered 2020-09-19: 50 ug via INTRAVENOUS
  Filled 2020-09-19: qty 2

## 2020-09-19 MED ORDER — ROPIVACAINE HCL 5 MG/ML IJ SOLN
INTRAMUSCULAR | Status: AC
  Filled 2020-09-19: qty 30

## 2020-09-19 MED ORDER — LIDOCAINE HCL (PF) 1 % IJ SOLN
INTRAMUSCULAR | Status: AC
  Filled 2020-09-19: qty 5

## 2020-09-19 MED ORDER — BACITRACIN ZINC 500 UNIT/GM EX OINT
TOPICAL_OINTMENT | CUTANEOUS | Status: AC
  Filled 2020-09-19: qty 28.35

## 2020-09-19 MED ORDER — PROPOFOL 10 MG/ML IV BOLUS
INTRAVENOUS | Status: AC
  Filled 2020-09-19: qty 20

## 2020-09-19 MED ORDER — MIDAZOLAM HCL 2 MG/2ML IJ SOLN
INTRAMUSCULAR | Status: AC
  Filled 2020-09-19: qty 2

## 2020-09-19 MED ORDER — FAMOTIDINE 20 MG PO TABS
ORAL_TABLET | ORAL | Status: AC
  Administered 2020-09-19: 20 mg via ORAL
  Filled 2020-09-19: qty 1

## 2020-09-19 MED ORDER — HYDROMORPHONE HCL 1 MG/ML IJ SOLN
INTRAMUSCULAR | Status: DC | PRN
  Administered 2020-09-19 (×2): .5 mg via INTRAVENOUS

## 2020-09-19 MED ORDER — HYDROMORPHONE HCL 1 MG/ML IJ SOLN
INTRAMUSCULAR | Status: AC
  Filled 2020-09-19: qty 1

## 2020-09-19 MED ORDER — FENTANYL CITRATE (PF) 100 MCG/2ML IJ SOLN
INTRAMUSCULAR | Status: AC
  Filled 2020-09-19: qty 2

## 2020-09-19 MED ORDER — FENTANYL CITRATE (PF) 100 MCG/2ML IJ SOLN
INTRAMUSCULAR | Status: DC | PRN
  Administered 2020-09-19: 50 ug via INTRAVENOUS

## 2020-09-19 MED ORDER — PROPOFOL 10 MG/ML IV BOLUS
INTRAVENOUS | Status: DC | PRN
  Administered 2020-09-19: 170 mg via INTRAVENOUS

## 2020-09-19 MED ORDER — ROPIVACAINE HCL 5 MG/ML IJ SOLN
INTRAMUSCULAR | Status: DC | PRN
  Administered 2020-09-19: 20 mL via PERINEURAL
  Administered 2020-09-19: 10 mL via PERINEURAL

## 2020-09-19 MED ORDER — OXYCODONE-ACETAMINOPHEN 7.5-325 MG PO TABS: 1.0000 | ORAL_TABLET | Freq: Four times a day (QID) | ORAL | 0 refills | Status: AC | PRN

## 2020-09-19 MED ORDER — FENTANYL CITRATE (PF) 100 MCG/2ML IJ SOLN: 50.0000 ug | Freq: Once | INTRAMUSCULAR | Status: AC

## 2020-09-19 MED ORDER — KETOROLAC TROMETHAMINE 30 MG/ML IJ SOLN
INTRAMUSCULAR | Status: DC | PRN
  Administered 2020-09-19: 30 mg via INTRAVENOUS

## 2020-09-19 MED ORDER — ONDANSETRON HCL 4 MG PO TABS: 4.0000 mg | ORAL_TABLET | Freq: Every day | ORAL | 1 refills | Status: DC | PRN

## 2020-09-19 MED ORDER — ONDANSETRON HCL 4 MG/2ML IJ SOLN
INTRAMUSCULAR | Status: DC | PRN
  Administered 2020-09-19: 4 mg via INTRAVENOUS

## 2020-09-19 MED ORDER — BUPIVACAINE HCL (PF) 0.5 % IJ SOLN
INTRAMUSCULAR | Status: AC
  Filled 2020-09-19: qty 30

## 2020-09-19 MED ORDER — CEPHALEXIN 500 MG PO CAPS: 500.0000 mg | ORAL_CAPSULE | Freq: Three times a day (TID) | ORAL | 0 refills | Status: AC

## 2020-09-19 MED ORDER — DEXAMETHASONE SODIUM PHOSPHATE 10 MG/ML IJ SOLN
INTRAMUSCULAR | Status: DC | PRN
  Administered 2020-09-19: 10 mg via INTRAVENOUS

## 2020-09-19 MED ORDER — MIDAZOLAM HCL 2 MG/2ML IJ SOLN: 1.0000 mg | Freq: Once | INTRAMUSCULAR | Status: AC

## 2020-09-19 MED ORDER — LACTATED RINGERS IV SOLN: INTRAVENOUS | Status: DC | PRN

## 2020-09-19 MED ORDER — MIDAZOLAM HCL 2 MG/2ML IJ SOLN
INTRAMUSCULAR | Status: AC
  Administered 2020-09-19: 1 mg via INTRAVENOUS
  Filled 2020-09-19: qty 2

## 2020-09-19 MED ORDER — ROCURONIUM BROMIDE 100 MG/10ML IV SOLN
INTRAVENOUS | Status: DC | PRN
  Administered 2020-09-19: 10 mg via INTRAVENOUS
  Administered 2020-09-19: 40 mg via INTRAVENOUS

## 2020-09-19 MED ORDER — ASPIRIN EC 325 MG PO TBEC: 325.0000 mg | DELAYED_RELEASE_TABLET | Freq: Two times a day (BID) | ORAL | 0 refills | Status: AC

## 2020-09-19 MED ORDER — CHLORHEXIDINE GLUCONATE 0.12 % MT SOLN
OROMUCOSAL | Status: AC
  Administered 2020-09-19: 15 mL via OROMUCOSAL
  Filled 2020-09-19: qty 15

## 2020-09-19 MED ORDER — LIDOCAINE HCL (CARDIAC) PF 100 MG/5ML IV SOSY
PREFILLED_SYRINGE | INTRAVENOUS | Status: DC | PRN
  Administered 2020-09-19: 20 mg via INTRAVENOUS

## 2020-09-19 MED ORDER — LIDOCAINE HCL (PF) 1 % IJ SOLN
INTRAMUSCULAR | Status: DC | PRN
  Administered 2020-09-19: 3 mL

## 2020-09-19 SURGICAL SUPPLY — 70 items
BIT DRILL 1.6MM (DRILL) ×2 IMPLANT
BIT DRILL 2 FENESTRATED (MISCELLANEOUS) ×2 IMPLANT
BIT DRILL 2.4X140 LONG SOLID (BIT) ×3 IMPLANT
BIT DRILL CANNULTD 2.6 X 130MM (DRILL) ×2 IMPLANT
BIT DRILLL 2 FENESTRATED (MISCELLANEOUS) ×1
BLADE MED AGGRESSIVE (BLADE) ×3 IMPLANT
BNDG CMPR STD VLCR NS LF 5.8X4 (GAUZE/BANDAGES/DRESSINGS) ×2
BNDG CMPR STD VLCR NS LF 5.8X6 (GAUZE/BANDAGES/DRESSINGS) ×2
BNDG ELASTIC 4X5.8 VLCR NS LF (GAUZE/BANDAGES/DRESSINGS) ×3 IMPLANT
BNDG ELASTIC 6X5.8 VLCR NS LF (GAUZE/BANDAGES/DRESSINGS) ×3 IMPLANT
BNDG ESMARK 4X12 TAN STRL LF (GAUZE/BANDAGES/DRESSINGS) ×3 IMPLANT
BNDG GAUZE 4.5X4.1 6PLY STRL (MISCELLANEOUS) ×3 IMPLANT
BONE MATRIX 5 BEAST 100 (Tissue) ×3 IMPLANT
CANISTER SUCT 1200ML W/VALVE (MISCELLANEOUS) ×3 IMPLANT
COVER LIGHT HANDLE STERIS (MISCELLANEOUS) ×6 IMPLANT
CUFF TOURN SGL QUICK 18X4 (TOURNIQUET CUFF) ×3 IMPLANT
CUFF TOURN SGL QUICK 24 (TOURNIQUET CUFF) ×3
CUFF TRNQT CYL 24X4X40X1 (TOURNIQUET CUFF) ×2 IMPLANT
DRAPE FLUOR MINI C-ARM 54X84 (DRAPES) ×3 IMPLANT
DRILL 1.6MM (DRILL) ×3
DRILL CANNULATED 2.6 X 130MM (DRILL) ×3
DURAPREP 26ML APPLICATOR (WOUND CARE) ×3 IMPLANT
ELECT REM PT RETURN 9FT ADLT (ELECTROSURGICAL) ×3
ELECTRODE REM PT RTRN 9FT ADLT (ELECTROSURGICAL) ×2 IMPLANT
GAUZE SPONGE 4X4 12PLY STRL (GAUZE/BANDAGES/DRESSINGS) ×3 IMPLANT
GAUZE XEROFORM 1X8 LF (GAUZE/BANDAGES/DRESSINGS) ×3 IMPLANT
GLOVE SURG ENC MOIS LTX SZ7 (GLOVE) ×3 IMPLANT
GLOVE SURG POLYISO LF SZ7 (GLOVE) ×3 IMPLANT
GOWN STRL REUS W/ TWL LRG LVL3 (GOWN DISPOSABLE) ×4 IMPLANT
GOWN STRL REUS W/TWL LRG LVL3 (GOWN DISPOSABLE) ×6
HARVESTER GRAFT BONE 8 (MISCELLANEOUS) ×3 IMPLANT
HARVESTER GRAFT BONE 8 TP DOOR (MISCELLANEOUS) ×3 IMPLANT
K-WIRE SMOOTH TROCAR 2.0X150 (WIRE) ×6
K-WIRE SNGL END 1.2X150 (MISCELLANEOUS) ×9
KIT PROCEDURE DRILL (DRILL) ×3 IMPLANT
KIT TURNOVER KIT A (KITS) ×3 IMPLANT
KWIRE SMOOTH TROCAR 2.0X150 (WIRE) ×4 IMPLANT
KWIRE SNGL END 1.2X150 (MISCELLANEOUS) ×6 IMPLANT
NS IRRIG 500ML POUR BTL (IV SOLUTION) ×3 IMPLANT
Olive wire 1.3mm ×6 IMPLANT
PACK EXTREMITY ARMC (MISCELLANEOUS) ×3 IMPLANT
PADDING CAST BLEND 4X4 NS (MISCELLANEOUS) ×9 IMPLANT
PENCIL SMOKE EVACUATOR (MISCELLANEOUS) ×3 IMPLANT
PLATE LOCK BG 10H T2 SHAPED (Plate) ×2 IMPLANT
RASP SM TEAR CROSS CUT (RASP) ×3 IMPLANT
SCREW BG LOCKING 2.5X10 (Screw) ×3 IMPLANT
SCREW CANN 4.0X42 SHORT THREAD (Screw) ×6 IMPLANT
SCREW LOCK PLATE R3 3.5X14 (Screw) ×3 IMPLANT
SCREW LOCK PLATE R3 3.5X16 (Screw) ×3 IMPLANT
SCREW LOCK PLATE R3 3.5X28 (Screw) ×3 IMPLANT
SCREW LOCKING 2.5X12 (Screw) ×3 IMPLANT
SCREW LOCKING 2.5X14 (Screw) ×3 IMPLANT
SCREW LOCKING 2.5X16 (Screw) ×6 IMPLANT
SCREW NON LOCK 3.5X18 (Screw) ×3 IMPLANT
SPLINT CAST 1 STEP 4X30 (MISCELLANEOUS) ×3 IMPLANT
SPLINT CAST 1 STEP 5X30 WHT (MISCELLANEOUS) ×3 IMPLANT
STAPLE DYNACLIP 20X18X18 (Staple) ×3 IMPLANT
STIMULATOR BONE (ORTHOPEDIC SUPPLIES) ×1
STIMULATOR BONE GROWTH EMG EXT (ORTHOPEDIC SUPPLIES) ×2 IMPLANT
SUT ETHILON 4-0 (SUTURE) ×3
SUT ETHILON 4-0 FS2 18XMFL BLK (SUTURE) ×2
SUT MNCRL 4-0 (SUTURE) ×3
SUT MNCRL 4-0 27XMFL (SUTURE) ×2
SUT VIC AB 4-0 FS2 27 (SUTURE) ×3 IMPLANT
SUTURE ETHLN 4-0 FS2 18XMF BLK (SUTURE) ×2 IMPLANT
SUTURE MNCRL 4-0 27XMF (SUTURE) ×2 IMPLANT
T-PLATE 10 HOLE (Plate) ×3 IMPLANT
WIRE OLIVE SMOOTH 1.3 (WIRE) ×3 IMPLANT
WIRE OLIVE SMOOTH 1.4MMX60MM (WIRE) ×6 IMPLANT
bone graft harvester door, 8mm ×3 IMPLANT

## 2020-09-19 NOTE — Op Note (Signed)
PODIATRY / FOOT AND ANKLE SURGERY OPERATIVE REPORT    SURGEON: Rosetta Posner, DPM  Assistant: Gwyneth Revels, DPM  PRE-OPERATIVE DIAGNOSIS: All left foot 1.  Lisfranc fracture dislocation, homolateral 1 through 5 2.  Comminuted second, third, fourth metatarsal base fractures  POST-OPERATIVE DIAGNOSIS: Same  PROCEDURE(S): Left foot calcaneal bone graft harvest Lisfranc left foot open reduction with internal fixation Arthrodesis tarsometatarsal joints 1, 2, 3 Percutaneous pinning of left fourth and fifth tarsometatarsal joints after reduction Application of bone stimulator left foot  HEMOSTASIS: Left thigh tourniquet  ANESTHESIA: general, preop popliteal and saphenous nerve block performed by anesthesia  ESTIMATED BLOOD LOSS: 50 cc  FINDING(S): 1.  Homolateral Lisfranc fracture dislocation left foot 2.  Comminuted metatarsal base fractures 2, 3, 4  PATHOLOGY/SPECIMEN(S): None  INDICATIONS:   Joel Carr is a 35 y.o. male who presents with an injury to the left foot after sustaining a fall from a ladder of about 3 to 4 feet tall.  Patient landed on his foot and had immediate pain and could not bear weight on his foot.  Patient was taken to the emergency room where x-rays were taken as well as CT scan which showed a Lisfranc fracture dislocation with comminuted metatarsal base fractures 2, 3, 4 and homolateral translation of all metatarsals.  Patient was placed in a compression wrap with posterior splint and sent to our clinic for further evaluation.  Patient had notable swelling after the injury so was placed in another compressive wrap and was evaluated 1 more time prior to surgery and swelling was noted to be well reduced after a week and a half of the injury.  Patient presents today for surgical intervention consisting of above procedures noted described on the left foot.  All treatment options were discussed with patient both conservative and surgical attempts at correction include  potential risks and complications of surgical intervention, no guarantees given.  Discussed with patient long-term that this is a very difficult procedure to recover from and putting the joints in place will hopefully restore function to the foot long-term.  DESCRIPTION: After obtaining full informed written consent, the patient was brought back to the operating room and placed supine upon the operating table.  The patient received a popliteal and saphenous nerve block before entering the operating room.  The patient received IV antibiotics prior to induction.  A pneumatic left thigh tourniquet was placed palpation left thigh after obtaining adequate anesthesia, the patient was prepped and draped in the standard fashion.  An Esmarch bandage was used to exsanguinate the left lower extremity pneumatic thigh tourniquet was inflated  Attention was directed to the medial aspect of the first tarsometatarsal joint where a linear longitudinal incision was made across this area.  The incision was deepened to the subcutaneous tissues utilizing sharp and blunt dissection and care was taken to identify and retract all vital neurovascular structures no venous contributories were cauterized necessary.  The tibialis anterior tendon was identified and retracted.  At this time a capsular and periosteal incision was made at the medial aspect of the first tarsometatarsal joint and the first tarsometatarsal joint was exposed.  There appeared to be a mild amount of cartilage damage present to this area.  The joint also appeared to be laterally translated and displaced slightly dorsal as well.  At this time another incision was made that was around 3-1/2 cm from this incision over the dorsum of the foot over the second metatarsal and tarsometatarsal joint.  The incision was marked  out with fluoroscopic guidance first.  The incision was deepened through the subcutaneous tissues utilizing sharp and blunt dissection and care was  taken to identify and retract all vital neurovascular structures no venous contributories were cauterized as necessary.  At this time the extensor retinaculum was transected and the extensor tendons were retracted as well as the extensor digitorum brevis tendons.  The extensor hallucis longus tendon was retracted medially and the other tendons were retracted laterally.  The neurovascular bundle was identified and retracted medially under the skin flap.  At this time a capsular and periosteal incision was made over the second tarsometatarsal joint and the periosteum was reflected medially and laterally thereby exposing the joint at the operative site.  There appeared to be a comminuted fracture at the base of the second metatarsal.  There appeared to be cartilage damage as well as it appeared to be an intra-articular type fracture of multiple pieces present.  The articular cartilage was resected and passed off the operative site.  The medial cuneiform was identified through this as well and the interval between the intermediate cuneiform and medial cuneiform was identified and the articular cartilage from this area was resected as well.  The articular cartilage off of the first tarsometatarsal joint was also resected and passed off the operative site.  At this time an incision was made at the lateral aspect of the calcaneus that was approximately 1 to 2 cm in nature.  The incision was then deepened through the subcutaneous tissues utilizing sharp and blunt dissection and care was taken to identify and retract all vital neural and vascular structures and all venous contributories were cauterized as necessary.  At this time the Paragon 28 bone marrow was then placed through this area and reamed once and was removed.  Autograft was able to be obtained.  The reamer was then placed again for a second pass and more autograft was able to be obtained.  The surgical site was flushed with copious amounts normal sterile  saline.  The skin incision was then reapproximated well coapted with 3-0 nylon in simple type stitching.  Fenestration was then performed to the joint surfaces of the first tarsometatarsal joint and second tarsometatarsal joint after verifying all articular cartilage was removed.  A flush was performed with copious amounts normal sterile saline prior to fenestration.  The autograft that was obtained was placed into the second tarsometatarsal joint.  Paragon 28 DBM was also injected into the first tarsometatarsal joint and second tarsometatarsal joint.  The first and second tarsometatarsal joints were held in a reduced position in hopes to translate the lateral 3 rays with a reduction clamp.  Once the second tarsometatarsal joint was held in the appropriate position a guidewire for homerun screw was placed through the dorsal lateral aspect of the second metatarsal base and directed into the medial cuneiform with the appropriate orientation.  This was performed under fluoroscopic guidance.  At this time a Paragon 28 4.0 cannulated partially-threaded 42 mm screw was placed with excellent compression noted.  Once this was performed it did appear to realign the first and second tarsometatarsal joints and third tarsometatarsal joint.  The fourth and fifth tarsometatarsal joints though still appear to be laterally translated.  At this time attention was directed to the first tarsometatarsal joint where a guidewire for a 4.0 cannulated Paragon 28 screw was placed from dorsal first metatarsal base across the first tarsometatarsal joint and into the plantar medial cuneiform.  This was done under fluoroscopic guidance.  Utilizing standard AO principles and techniques a 4.0 x 42 mm cannulated Paragon 28 screw was placed across the first tarsometatarsal joint with excellent compression noted.  A medial Lapidus Paragon 28 plate was placed across the first tarsometatarsal joint and 2 locking screws were placed proximal that  were 3.5 mm, one of the locking screws was placed through the intermediate cuneiform as well while holding compression with a reduction clamp.  2 more 3.5 screws were placed into the first metatarsal, 1 being nonlocking and 1 being locking.  Attention was then directed back to the dorsal aspect of the second tarsometatarsal joint where a 18 mm Paragon 28 staple was placed using standard protocol.  Excellent compression was noted overall across the first and second tarsometatarsal joints with near anatomic position overall.  This was verified under fluoroscopic guidance.  Attention was then directed to the third tarsometatarsal joint through the same incision dorsally on the foot.  This appeared to be comminuted at the base involving the articular surface but appeared to also extend to the shaft of the third metatarsal.  Multiple bony fragments were removed including articular cartilage.  All articular cartilage was removed from the third tarsometatarsal joint area.  Autograft was also placed in this area as well as DBM.  Fenestration was also performed with a 2.0 drill bit prior to placing the bone graft after a flush was performed with copious amounts normal sterile saline.  At this time a Paragon 28 T plate 8 hole was placed.  The third metatarsal was then held in a compressed position and the Tijo plate was then placed with all of wires.  Placement of plate was verified under fluoroscopic guidance which appeared to be in good alignment overall.  At this time 2 locking screws were placed into the third cuneiform.  3 distal locking screws were then placed to the fourth metatarsal while holding the foot in a rectus position.  Excellent compression appeared to be across the third tarsometatarsal joint with bone in good alignment overall, near anatomic.  The pneumatic thigh tourniquet was deflated and a prompt hyperemic response was noted all digits left foot.  The surgical sites were flushed with copious amounts  normal sterile saline.  The medial incision was then reapproximated well coapted with 3-0 Vicryl to the capsular and periosteal tissue, 4-0 Monocryl to the subcutaneous tissue, and 4-0 nylon to the skin and horizontal mattress type technique.  An Esmarch bandage was used to exsanguinate the left lower extremity again and the pneumatic thigh tourniquet was not inflated.  Attention was then directed to the dorsal lateral aspect of the left foot between the fourth and fifth metatarsals and tarsometatarsal joints were linear longitudinal incision was made over this area again leaving an appropriate skin bridge that was around 3.5 cm lateral to the dorsal incision.  The incision was deepened through the subcutaneous tissue utilizing sharp and blunt dissection and care was taken to identify and retract all vital neurovascular structures all venous contributories were cauterized necessary.  At this time the deep fascia was incised and the peroneus tertius was able to be visualized as well as the extensor digitorum brevis muscle bellies.  An interval incision was made between these areas and the fourth tarsometatarsal joint was identified and still noted to be dorsally and laterally displaced.  There appeared to be a small bony fragment from the third metatarsal base which was abutting against this area and impacting it and not allowing the fourth and fifth metatarsals to  be reduced.  This piece was resected and passed off the operative site and then the fourth and fifth tarsometatarsal joints were more able to be translated back into the original position.  There appeared to be notable ligamentous damage to the fourth tarsometatarsal joint and fifth tarsometatarsal joints as they appear to be unstable.  There did not appear to be much articular cartilage damage throughout the fourth or fifth tarsometatarsal joints.  At this time while holding the foot in a reduced position to 0.062 K wires were then placed 1 across the  fourth tarsometatarsal joint and one across the fifth tarsometatarsal joint while holding the foot in a stable position and reduced position.  Near-anatomic alignment was obtained.  A portion of the third tarsometatarsal joint was still able to be visualized due to the bony defect and additional bone graft and DBM was placed in this area.  All surgical sites were flushed with copious amounts normal sterile saline.  The periosteal and capsular structures then reapproximated well coapted with 3-0 Vicryl.  The subcutaneous tissue was reapproximated well coapted with 4-0 Monocryl.  The skin was then reapproximated well coapted with 4-0 nylon in a combination of simple and horizontal mattress type stitching.  The pneumatic thigh tourniquet was deflated during the closure process and a prompt hyperemic response was noted all digits left foot and at the skin flaps.  Final C-arm imaging was then taken showing adequate fixation across the first, second, third tarsometatarsal joints and percutaneous pinning across the fourth and fifth tarsometatarsal joints.  The tarsometatarsal joints appear to be in near anatomic alignment overall.  The fourth and fifth tarsometatarsal joint articulation appeared to be intact at this time with minimal to no cartilage damage.  The wires were then bent and cut and pin caps were placed.  Triple antibiotic was then placed around the wire entry points.  Xeroform was then applied around all incision sites followed by 4 x 4 gauze, ABD, Kerlix, web roll, posterior splint, Ace wrap.  The patient tolerated the procedure and anesthesia well and was transferred to recovery room vital signs stable vascular status intact all toes left foot.  The patient will be discharged with the appropriate orders, discharge instructions, and medications.  Patient is to remain nonweightbearing at all times left lower extremity.  Discussed procedure with patient's spouse.  Discussed the amount of damage to the  tarsometatarsal joints and that will be a difficult recovery for the patient.  The fourth and fifth tarsometatarsal joints appear to be located but discussed that they could potentially dislocate in the future and could require further subsequent surgery but we will try to preserve the range of motion present in these areas to try to preserve some foot function.  Long-term may require more operative procedures.  Discussed also that a bone stimulator was placed after the procedure to try to promote more bone healing across the tarsometatarsal joint areas.  Discussed usage with patient's spouse same with manufacture.  Patient given instructions on how to use.  Patient to follow-up in clinic within 1 week of surgical date postop.  COMPLICATIONS: None  CONDITION: Good, stable  Rosetta Posner, DPM

## 2020-09-19 NOTE — Transfer of Care (Signed)
Immediate Anesthesia Transfer of Care Note  Patient: Joel Carr  Procedure(s) Performed: ARTHRODESIS METATARSAL; LISFRANC MULTIPLE-1/2/3 (Left: Toe) OPEN REDUCTION INTERNAL FIXATION (ORIF) FOOT LISFRANC FRACTURE x2 4/5  PERCUTANEOUS SKELETAL FIXATION OF TARSOMETATARSAL JOINT DISLOCATION WITH MANIPULATION, X2 4/5 (Left: Foot) ARTHRODESIS FOOT- *POSSIBLE BONE GRAFT (Left: Foot)  Patient Location: PACU  Anesthesia Type:General  Level of Consciousness: drowsy  Airway & Oxygen Therapy: Patient Spontanous Breathing and Patient connected to face mask oxygen  Post-op Assessment: Report given to RN and Post -op Vital signs reviewed and stable  Post vital signs: Reviewed and stable  Last Vitals:  Vitals Value Taken Time  BP 134/82 09/19/20 1600  Temp    Pulse 102 09/19/20 1604  Resp 16 09/19/20 1604  SpO2 100 % 09/19/20 1604    Last Pain:  Vitals:   09/19/20 1010  TempSrc: Temporal  PainSc: 0-No pain         Complications: No notable events documented.

## 2020-09-19 NOTE — Discharge Instructions (Addendum)
 [facility]  POST OPERATIVE INSTRUCTIONS FOR DR. Ether Griffins AND DR. BAKER [facility]IN[facility] PODIATRY DEPARTMENT   Take your medication as prescribed.  Pain medication should be taken only as needed.  You may take ibuprofen or Tylenol also between pain medication doses.  If pain is still out of control and very severe you may try taking 1 pain tablet every 4 hours in addition to taking ibuprofen and Tylenol between doses.  Try to take pain medication as prescribed.  Keep the dressing clean, dry and intact.  Remain nonweightbearing to left lower extremity at all times.  Keep your foot elevated above the heart level for the first 48 hours.  Continue elevation thereafter for further swelling improvement.  You may also put ice on the top of your foot and ankle for a maximum of 10 minutes out of every 1 hour as needed.  Take aspirin 325 mg twice daily.  Take antibiotic medication as prescribed until gone.  You may take antinausea medicine also as needed.  Always use crutches, knee scooter or wheelchair do not put weight on the left foot.  Do not take a shower. Baths are permissible as long as the foot is kept out of the water.   Every hour you are awake:  Bend your knee 15 times. Massage calf 15 times  Call [facility] [phone]) if any of the following problems occur: You develop a temperature or fever. The bandage becomes saturated with blood. Medication does not stop your pain. Injury of the foot occurs. Any symptoms of infection including redness, odor, or red streaks running from wound. AMBULATORY SURGERY  DISCHARGE INSTRUCTIONS   The drugs that you were given will stay in your system until tomorrow so for the next 24 hours you should not:  Drive an automobile Make any legal decisions Drink any alcoholic beverage   You may resume regular meals tomorrow.  Today it is better to start with liquids and gradually work up to solid  foods.  You may eat anything you prefer, but it is better to start with liquids, then soup and crackers, and gradually work up to solid foods.   Please notify your doctor immediately if you have any unusual bleeding, trouble breathing, redness and pain at the surgery site, drainage, fever, or pain not relieved by medication.    Additional Instructions:     Please contact your physician with any problems or Same Day Surgery at [phone], Monday through Friday 6 am to 4 pm, or [place] at [facility] number at [phone].

## 2020-09-19 NOTE — Anesthesia Procedure Notes (Signed)
Procedure Name: Intubation Date/Time: 09/19/2020 11:45 AM Performed by: Willette Alma, CRNA Pre-anesthesia Checklist: Patient identified, Patient being monitored, Timeout performed, Emergency Drugs available and Suction available Patient Re-evaluated:Patient Re-evaluated prior to induction Oxygen Delivery Method: Circle system utilized Preoxygenation: Pre-oxygenation with 100% oxygen Induction Type: IV induction Ventilation: Mask ventilation without difficulty Laryngoscope Size: Mac and 4 Grade View: Grade I Tube type: Oral Tube size: 7.0 mm Number of attempts: 1 Airway Equipment and Method: Stylet Placement Confirmation: ETT inserted through vocal cords under direct vision, positive ETCO2 and breath sounds checked- equal and bilateral Secured at: 21 cm Tube secured with: Tape Dental Injury: Teeth and Oropharynx as per pre-operative assessment

## 2020-09-19 NOTE — Anesthesia Preprocedure Evaluation (Signed)
Anesthesia Evaluation  Patient identified by MRN, date of birth, ID band Patient awake    Reviewed: Allergy & Precautions, NPO status , Patient's Chart, lab work & pertinent test results  History of Anesthesia Complications Negative for: history of anesthetic complications  Airway Mallampati: II  TM Distance: >3 FB Neck ROM: Full    Dental no notable dental hx.    Pulmonary neg sleep apnea, neg COPD, Current Smoker and Patient abstained from smoking.,    breath sounds clear to auscultation- rhonchi (-) wheezing      Cardiovascular Exercise Tolerance: Good (-) hypertension(-) CAD, (-) Past MI, (-) Cardiac Stents and (-) CABG  Rhythm:Regular Rate:Normal - Systolic murmurs and - Diastolic murmurs    Neuro/Psych  Headaches, neg Seizures PSYCHIATRIC DISORDERS Anxiety Depression Bipolar Disorder    GI/Hepatic Neg liver ROS, GERD  ,  Endo/Other  negative endocrine ROSneg diabetes  Renal/GU negative Renal ROS     Musculoskeletal negative musculoskeletal ROS (+)   Abdominal (+) - obese,   Peds  Hematology negative hematology ROS (+)   Anesthesia Other Findings Past Medical History: No date: Anxiety No date: Bipolar disorder (HCC) 12/23/2019: COVID-19 virus infection     Comment:  mild congestion and body aches No date: Depression No date: History of kidney stones No date: Hyperlipidemia No date: Pneumonia No date: RLS (restless legs syndrome)   Reproductive/Obstetrics                             Anesthesia Physical Anesthesia Plan  ASA: 2  Anesthesia Plan: General   Post-op Pain Management:  Regional for Post-op pain   Induction: Intravenous  PONV Risk Score and Plan: 0 and Ondansetron and Midazolam  Airway Management Planned: Oral ETT  Additional Equipment:   Intra-op Plan:   Post-operative Plan: Extubation in OR  Informed Consent: I have reviewed the patients History and  Physical, chart, labs and discussed the procedure including the risks, benefits and alternatives for the proposed anesthesia with the patient or authorized representative who has indicated his/her understanding and acceptance.     Dental advisory given  Plan Discussed with: CRNA and Anesthesiologist  Anesthesia Plan Comments:         Anesthesia Quick Evaluation

## 2020-09-19 NOTE — Progress Notes (Signed)
Virtual Visit via Video Note  I connected with Joel Carr on 09/18/20 at  9:00 AM EDT by a video enabled telemedicine application and verified that I am speaking with the correct person using two identifiers.  At orientation to the IOP program, Case Manager discussed the limitations of evaluation and management by telemedicine and the availability of in person appointments. The patient expressed understanding and agreed to proceed with virtual visits throughout the duration of the program.   Location:  Patient: Patient Home Provider: Home Office   History of Present Illness: Bipolar Affective DO  Observations/Objective: Check In: Case Manager checked in with all participants to review discharge dates, insurance authorizations, work-related documents and needs from the treatment team regarding medications. Client stated needs and engaged in discussion. Counselor engaged group in discussion, covering the following topics: community resources, jobs that are MH accommodating, managing to do lists and life transitions. Client presents with moderate depression and moderate anxiety. Client denied any current SI/HI/psychosis.  Initial Therapeutic Activity: Counselor introduced Sheppard Coil, [place] Chaplain to present information and discussion on Grief and Loss. Group members engaged in discussion, sharing how grief impacts them, what comforts them, what emotions are felt, labeling losses, etc. After guest speaker logged off, Counselor prompted group to spend 10-15 minutes journaling to process personal grief and loss situations. Counselor processed entries with group and client's identified areas for additional processing in individual therapy. Client noted their individual issues of grief and how they are coping.   Second Therapeutic Activity: Counselor introduced [employer], representative with [employer] to share about programming. Group Members asked questions and engaged in discussion,  as [name] shared about Peer Support, Support Groups and the [employer]. Client stated that they are interested in connecting with the peer support.  Check Out: Counselor closed program by allowing time to celebrate two graduating group members. Counselor shared reflections on progress and allow space for group members to share well wishes and encouragements with the graduating client. Counselor prompted graduating client to share takeaways, reflect on progress and final thoughts for the group. Client endorsed safety plan to be followed to prevent safety issues.   Assessment and Plan: Clinician recommends that Client remain in IOP treatment to better manage mental health symptoms, stabilization and to address treatment plan goals. Clinician recommends adherence to crisis/safety plan, taking medications as prescribed, and following up with medical professionals if any issues arise.    Follow Up Instructions: Clinician will send Webex link for next session. The Client was advised to call back or seek an in-person evaluation if the symptoms worsen or if the condition fails to improve as anticipated.     I provided 180 minutes of non-face-to-face time during this encounter.     Hilbert Odor, LCSW

## 2020-09-19 NOTE — Anesthesia Procedure Notes (Signed)
Anesthesia Regional Block: Popliteal block   Pre-Anesthetic Checklist: , timeout performed,  Correct Patient, Correct Site, Correct Laterality,  Correct Procedure, Correct Position, site marked,  Risks and benefits discussed,  Surgical consent,  Pre-op evaluation,  At surgeon's request and post-op pain management  Laterality: Left  Prep: chloraprep       Needles:  Injection technique: Single-shot  Needle Type: Stimiplex     Needle Length: 10cm  Needle Gauge: 21     Additional Needles:   Procedures:,,,, ultrasound used (permanent image in chart),,    Narrative:  Start time: 09/19/2020 11:05 AM End time: 09/19/2020 11:14 AM Injection made incrementally with aspirations every 5 mL.  Performed by: Personally  Anesthesiologist: Alver Fisher, MD  Additional Notes: Functioning IV was confirmed and monitors were applied.  A Stimuplex needle was used. Sterile prep and drape,hand hygiene and sterile gloves were used.  Negative aspiration and negative test dose prior to incremental administration of local anesthetic. The patient tolerated the procedure well.  Supplemental saphenous block also performed

## 2020-09-19 NOTE — H&P (Signed)
HISTORY AND PHYSICAL INTERVAL NOTE:  09/19/2020  11:08 AM  Joel Carr  has presented today for surgery, with the diagnosis of S93.312A - Closed displaced fracture of first metatarsal bone of left foot S92.322A - Closed displaced fracture of second metatarsal bone of left foot S92.332A - Closed displaed fracture of third metatarsal bone of left foot S92.342A - Closed displaced fracture of fourth metatarsal bone of left foot S92.352A - Closed displaced fracture of fifth metatarsal bone of left foot S92.215A - Closed nondisplaced fracture of cuboid of left foot S93.325A - Lisfranc dislocation, left.  The various methods of treatment have been discussed with the patient.  No guarantees were given.  After consideration of risks, benefits and other options for treatment, the patient has consented to surgery.  I have reviewed the patients' chart and labs.    PROCEDURE: ALL LEFT FOOT CALCANEAL BONE GRAFT HARVEST  ORIF LISFRANC FRACTURE DISLOCATION FUSION OF TMTJS 1-3 PINNING OF 4-5 TMTJS  A history and physical examination was performed in my office.  The patient was reexamined.  There have been no changes to this history and physical examination.  Rosetta Posner, DPM

## 2020-09-22 ENCOUNTER — Other Ambulatory Visit (HOSPITAL_COMMUNITY): Payer: 59 | Admitting: Psychiatry

## 2020-09-22 ENCOUNTER — Other Ambulatory Visit: Payer: Self-pay

## 2020-09-22 DIAGNOSIS — F3132 Bipolar disorder, current episode depressed, moderate: Secondary | ICD-10-CM | POA: Diagnosis not present

## 2020-09-22 NOTE — Anesthesia Postprocedure Evaluation (Signed)
Anesthesia Post Note  Patient: Joel Carr  Procedure(s) Performed: ARTHRODESIS METATARSAL; LISFRANC MULTIPLE-1/2/3 (Left: Toe) OPEN REDUCTION INTERNAL FIXATION (ORIF) FOOT LISFRANC FRACTURE x2 4/5  PERCUTANEOUS SKELETAL FIXATION OF TARSOMETATARSAL JOINT DISLOCATION WITH MANIPULATION, X2 4/5 (Left: Foot) ARTHRODESIS FOOT- *POSSIBLE BONE GRAFT (Left: Foot)  Patient location during evaluation: PACU Anesthesia Type: General Level of consciousness: awake and alert Pain management: pain level controlled Vital Signs Assessment: post-procedure vital signs reviewed and stable Respiratory status: spontaneous breathing, nonlabored ventilation, respiratory function stable and patient connected to nasal cannula oxygen Cardiovascular status: blood pressure returned to baseline and stable Postop Assessment: no apparent nausea or vomiting Anesthetic complications: no   No notable events documented.   Last Vitals:  Vitals:   09/19/20 1623 09/19/20 1632  BP: 132/60 120/75  Pulse: 95 84  Resp: 15 15  Temp: 36.6 C 36.6 C  SpO2: 97% 97%    Last Pain:  Vitals:   09/22/20 0837  TempSrc:   PainSc: 3                  Yevette Edwards

## 2020-09-23 ENCOUNTER — Encounter (HOSPITAL_COMMUNITY): Payer: Self-pay | Admitting: Psychiatry

## 2020-09-23 ENCOUNTER — Encounter (HOSPITAL_COMMUNITY): Payer: Self-pay

## 2020-09-23 ENCOUNTER — Other Ambulatory Visit: Payer: Self-pay

## 2020-09-23 ENCOUNTER — Other Ambulatory Visit (HOSPITAL_COMMUNITY): Payer: 59 | Admitting: Psychiatry

## 2020-09-23 DIAGNOSIS — F3132 Bipolar disorder, current episode depressed, moderate: Secondary | ICD-10-CM | POA: Diagnosis not present

## 2020-09-23 NOTE — Progress Notes (Signed)
Virtual Visit via Video Note  I connected with Joel Carr on 09/23/20 at  9:00 AM EDT by a video enabled telemedicine application and verified that I am speaking with the correct person using two identifiers.  At orientation to the IOP program, Case Manager discussed the limitations of evaluation and management by telemedicine and the availability of in person appointments. The patient expressed understanding and agreed to proceed with virtual visits throughout the duration of the program.   Location:  Patient: Patient Home Provider: Home Office   History of Present Illness: Bipolar Affective DO  Observations/Objective: Check In: Case Manager checked in with all participants to review discharge dates, insurance authorizations, work-related documents and needs from the treatment team regarding medications. Client stated needs and engaged in discussion.   Initial Therapeutic Activity: Counselor facilitated a check-in with group members to assess mood and current functioning. Client shared details of their mental health management since our last session, including challenges and successes. Counselor engaged group in discussion, covering the following topics: safety issues and concerns in environment, trauma triggers and responses, and the benefit of having a pet or plant to care for as coping. Client presents with mild depression and moderate anxiety. Client denied any current SI/HI/psychosis.  Second Therapeutic Activity: Counselor facilitated the creation of a Mental Health Safety Plan with group members. Counselor walked them through each of the 6 steps, sharing examples for each section, such as warning signs, coping skills, supports, professionals, safety considerations and reasons for living. Group members shared their responses, to open up ideas for others and to get feedback. Counselor promoted group members share their plans with their providers and their support system, to enhance and  secure adherence to prevent a mental health crisis.   Check Out: Counselor prompted group members to identify one self-care practice or productivity activity they would like to engage in today. Client shared plans with the group and is feeling hopeful about their day. Client endorsed safety plan to be followed to prevent safety issues.   Assessment and Plan: Clinician recommends that Client remain in IOP treatment to better manage mental health symptoms, stabilization and to address treatment plan goals. Clinician recommends adherence to crisis/safety plan, taking medications as prescribed, and following up with medical professionals if any issues arise.    Follow Up Instructions: Clinician will send Webex link for next session. The Client was advised to call back or seek an in-person evaluation if the symptoms worsen or if the condition fails to improve as anticipated.     I provided 180 minutes of non-face-to-face time during this encounter.     Hilbert Odor, LCSW

## 2020-09-23 NOTE — Progress Notes (Signed)
Virtual Visit via Video Note  I connected with Joel Carr n 09/22/20 at  9:00 AM EDT by a video enabled telemedicine application and verified that I am speaking with the correct person using two identifiers.  At orientation to the IOP program, Case Manager discussed the limitations of evaluation and management by telemedicine and the availability of in person appointments. The patient expressed understanding and agreed to proceed with virtual visits throughout the duration of the program.   Location:  Patient: Patient Home Provider: Home Office   History of Present Illness: Bipolar Affective DO  Observations/Objective: Check In: Case Manager checked in with all participants to review discharge dates, insurance authorizations, work-related documents and needs from the treatment team regarding medications. Client stated needs and engaged in discussion.   Initial Therapeutic Activity: Counselor facilitated a check-in with group members to assess mood and current functioning. Client shared details of their mental health management since our last session, including challenges and successes. Counselor engaged group in discussion, covering the following topics: cognitive coping skills, boundary setting, assertive communication, managing long term legal issues, benefits of faith communities, trauma-based counseling, and grief and loss work. Client presents with mild depression and moderate anxiety. Client denied any current SI/HI/psychosis.  Second Therapeutic Activity: Counselor presented 2 psychoeducational videos on cognitive coping and listening to Hugo DBT skill. Counselor reviewed primary points with group, processing their thoughts on the concepts. The Counselor challenged group on practical skill application in their own though patterns and in conversations with others. Client engaged well in discussion and in making a plan for skill application.  Check Out: Counselor prompted group  members to identify one self-care practice or productivity activity they would like to engage in today. Client plans to rest and recover from foot surgery. Client endorsed safety plan to be followed to prevent safety issues.   Assessment and Plan: Clinician recommends that Client remain in IOP treatment to better manage mental health symptoms, stabilization and to address treatment plan goals. Clinician recommends adherence to crisis/safety plan, taking medications as prescribed, and following up with medical professionals if any issues arise.    Follow Up Instructions: Clinician will send Webex link for next session. The Client was advised to call back or seek an in-person evaluation if the symptoms worsen or if the condition fails to improve as anticipated.     I provided 150 minutes of non-face-to-face time during this encounter.     Hilbert Odor, LCSW

## 2020-09-25 ENCOUNTER — Other Ambulatory Visit: Payer: Self-pay

## 2020-09-25 ENCOUNTER — Other Ambulatory Visit (HOSPITAL_COMMUNITY): Payer: 59 | Admitting: Psychiatry

## 2020-09-25 ENCOUNTER — Encounter (HOSPITAL_COMMUNITY): Payer: Self-pay

## 2020-09-25 DIAGNOSIS — F3132 Bipolar disorder, current episode depressed, moderate: Secondary | ICD-10-CM

## 2020-09-25 NOTE — Patient Instructions (Signed)
D:  Patient will discharge tomorrow (09-26-20).  A:  Follow up with Dr. Enriqueta Shutter (Patient to schedule) and EAP (Patient to schedule).  Encouraged support groups.  R:  Patient receptive.

## 2020-09-25 NOTE — Progress Notes (Signed)
Virtual Visit via Video Note  I connected with Joel Carr on 09/25/20 at  9:00 AM EDT by a video enabled telemedicine application and verified that I am speaking with the correct person using two identifiers.  At orientation to the IOP program, Case Manager discussed the limitations of evaluation and management by telemedicine and the availability of in person appointments. The patient expressed understanding and agreed to proceed with virtual visits throughout the duration of the program.   Location:  Patient: Patient Home Provider: Home Office   History of Present Illness: Bipolar Affective DO  Observations/Objective: Check In: Case Manager checked in with all participants to review discharge dates, insurance authorizations, work-related documents and needs from the treatment team regarding medications. Client stated needs and engaged in discussion. Counselor introduced new Client to group with group welcoming them and starting the joining process.   Initial Therapeutic Activity: Counselor facilitated a check-in with group members to assess mood and current functioning. Client shared details of their mental health management since our last session, including challenges and successes. Counselor engaged group in discussion, covering the following topics: cognitive coping, Client-Counselor relationship, discharge planning, boundary setting with family, loneliness, long term outcomes, and the therapeutic process. Client presents with moderate depression and moderate anxiety. Client denied any current SI/HI/psychosis.  Second Therapeutic Activity: Counselor presented psychoeducation on grief and loss work. Counselor allowed time for group members to complete one of two worksheets, My Stages of Grief and A Good-Bye Letter. Counselor engaged group in discussion on how they are feeling and coping with Grief and Loss. Client shared about specific losses and experiences after the loss. Counselor  provided additional resources on grief and loss work, support groups, counseling and articles. Client intends to do more work on specific grief reminders.   Check Out: Counselor closed program by allowing time to celebrate a graduating group member. Counselor shared reflections on progress and allow space for group members to share well wishes and encouragements with the graduating client. Counselor prompted graduating client to share takeaways, reflect on progress and final thoughts for the group. Client endorsed safety plan to be followed to prevent safety issues.   Assessment and Plan: Clinician recommends that Client remain in IOP treatment to better manage mental health symptoms, stabilization and to address treatment plan goals. Clinician recommends adherence to crisis/safety plan, taking medications as prescribed, and following up with medical professionals if any issues arise.    Follow Up Instructions: Clinician will send Webex link for next session. The Client was advised to call back or seek an in-person evaluation if the symptoms worsen or if the condition fails to improve as anticipated.     I provided 180 minutes of non-face-to-face time during this encounter.     Hilbert Odor, LCSW

## 2020-09-26 ENCOUNTER — Other Ambulatory Visit (HOSPITAL_COMMUNITY): Payer: 59 | Attending: Psychiatry | Admitting: Psychiatry

## 2020-09-26 ENCOUNTER — Other Ambulatory Visit: Payer: Self-pay

## 2020-09-26 ENCOUNTER — Encounter (HOSPITAL_COMMUNITY): Payer: Self-pay | Admitting: Psychiatry

## 2020-09-26 DIAGNOSIS — Z5989 Other problems related to housing and economic circumstances: Secondary | ICD-10-CM | POA: Diagnosis not present

## 2020-09-26 DIAGNOSIS — Z733 Stress, not elsewhere classified: Secondary | ICD-10-CM | POA: Diagnosis not present

## 2020-09-26 DIAGNOSIS — F3132 Bipolar disorder, current episode depressed, moderate: Secondary | ICD-10-CM | POA: Diagnosis not present

## 2020-09-26 NOTE — Progress Notes (Signed)
Virtual Visit via Video Note  I connected with Joel Carr on @TODAY @ at  9:00 AM EDT by a video enabled telemedicine application and verified that I am speaking with the correct person using two identifiers.  Location: Patient: at home Provider: at office   I discussed the limitations of evaluation and management by telemedicine and the availability of in person appointments. The patient expressed understanding and agreed to proceed.  I discussed the assessment and treatment plan with the patient. The patient was provided an opportunity to ask questions and all were answered. The patient agreed with the plan and demonstrated an understanding of the instructions.   The patient was advised to call back or seek an in-person evaluation if the symptoms worsen or if the condition fails to improve as anticipated.  I provided 20 minutes of non-face-to-face time during this encounter.   , M.Ed,CNA   Patient ID: Joel Carr, male   DOB: September 23, 1985, 35 y.o.   MRN: [id number] As per previous CCA states:  This is a 35 mwm who was referred per Dr. [id number] [facility]); treatment for Bipolar Disorder.  Pt states he was dx'd 14 yrs ago.  States Bipolar D/O with SI worsened the end of May 2022.  "I was sent to [facility] ED for an assessment."  Stressors:  1) Job [employer]) of three yrs.  2) Strained finances:  States he hasn't been getting the hrs he should in order to support his family.  Pt reports hx being admitted at [facility]pt [facility] 14 yrs ago whenever he was dx'd Bipolar.  Admits he had attempted suicide via OD during that time.  Has been seeing Dr. [facility] for 1 1/2 yrs and also sees an EAP.  Family hx:  Father (ETOH, Bipolar D/O and Depression); Mother (Drugs)   Current Symptoms/Problems: Denies SI/HI or A/V hallucinations, sadness, poor sleep d/t nightmares, increased anxiety, poor concentration, racing thoughts, no energy, no motivation, anhedonia, crying spells, poor  appetite (lost 5 lbs within 2 weeks), irritable  Pt attended all scheduled days.  States he is still struggling with a little depression; since he broke his foot.  Pt will be starting physical therapy soon.  States he probably won't return to work until [employer].  Denies SI/HI or A/V hallucinations.  On a scale of 1-10 (10 being the worst); pt rates his depression and anxiety at a 2. Reports that the groups were very helpful.  "I learned some coping skills that I will be using." A:  D/C today.  F/U with Dr. [employer] and EAP (pt states he will schedule both) d/t PT schedule. Encouraged support groups through [facility].  R:  Patient receptive.  [facility], M.Ed,CNA

## 2020-09-26 NOTE — Progress Notes (Signed)
Virtual Visit via Telephone Note  I connected with Joel Carr on 09/26/20 at  9:00 AM EDT by telephone and verified that I am speaking with the correct person using two identifiers.  Location: Patient: Home Provider: Office   I discussed the limitations, risks, security and privacy concerns of performing an evaluation and management service by telephone and the availability of in person appointments. I also discussed with the patient that there may be a patient responsible charge related to this service. The patient expressed understanding and agreed to proceed.   I discussed the assessment and treatment plan with the patient. The patient was provided an opportunity to ask questions and all were answered. The patient agreed with the plan and demonstrated an understanding of the instructions.   The patient was advised to call back or seek an in-person evaluation if the symptoms worsen or if the condition fails to improve as anticipated.  I provided 15 minutes of non-face-to-face time during this encounter.   Oneta Rack, NP   [facility]ensive [facility] Program Discharge Summary  TIA GELB [id number]  Admission date: 09/03/2018 Discharge date: 09/26/2020  Reason for admission: Per admission assessment note: Joel Carr 35 year old Caucasian male that presents due to worsening anxiety and depression due to financial and job stressors.  Reports his main concerns is due to " I am the only one working in the house at this time."  states he is constantly worried about bills. [name] reports he is married with 2 children.  78 year old and 64 year old.  States his wife has been supportive.  States he is currently employed by [employer]. [name] reported previous inpatient admission for 14 years prior where he was diagnosed with bipolar disorder.  Denies a history of physical sexual abuse  Progress in Program Toward Treatment Goals: Ongoing, patient attended and participated  with daily group session with active and engaged participation.  Denying suicidal or homicidal ideations.  Denies auditory or visual hallucinations.  Patient reports group was successful states he has saved extra resources that was provided throughout group setting to include [place] [employer] for additional support.  Declined medication refills at discharge.  Support, encouragement and reassurance was provided.  Progress (rationale): Dr Enriqueta Shutter with employee assistance program (EAP)   Take all medications as prescribed. Keep all follow-up appointments as scheduled.  Do not consume alcohol or use illegal drugs while on prescription medications. Report any adverse effects from your medications to your primary care provider promptly.  In the event of recurrent symptoms or worsening symptoms, call 911, a crisis hotline, or go to the nearest emergency department for evaluation.    Hillery Jacks NP  09/26/2020

## 2020-09-26 NOTE — Progress Notes (Signed)
Virtual Visit via Video Note  I connected with Jimmey Ralph on 09/26/20 at  9:00 AM EDT by a video enabled telemedicine application and verified that I am speaking with the correct person using two identifiers.  At orientation to the IOP program, Case Manager discussed the limitations of evaluation and management by telemedicine and the availability of in person appointments. The patient expressed understanding and agreed to proceed with virtual visits throughout the duration of the program.   Location:  Patient: Patient Home Provider: Home Office   History of Present Illness: Bipolar Affective DO  Observations/Objective: Check In: Case Manager checked in with all participants to review discharge dates, insurance authorizations, work-related documents and needs from the treatment team regarding medications. Client stated needs and engaged in discussion. Counselor introduced new Client to group with group welcoming them and starting the joining process.   Initial Therapeutic Activity: Counselor facilitated a check-in with group members to assess mood and current functioning. Client shared details of their mental health management since our last session, including challenges and successes. Counselor engaged group in discussion, covering the following topics: making a plan for long weekend, structuring days, coping with trauma triggers from fireworks and crowds, and including socialization for coping. Client presents with moderate depression and moderate anxiety. Client denied any current SI/HI/psychosis.  Second Therapeutic Activity: Counselor prompted group members to engage in a therapeutic activity to compare and contrast their anxiety and their depression. Counselor had them use a vin diagram to make visualization. Group members shared their reflections and responses, to share language used to describe symptoms. Counselor then shared a video about how avoidance is a common symptom of depression  and anxiety, sharing skills on how to get better at feeling, instead of just trying to feel better through avoidance and numbing emotions. Group engaged in takeaways from video and discussed strategies in decreasing avoidance.    Check Out: Counselor closed program by allowing time to celebrate a graduating group member. Counselor shared reflections on progress and allow space for group members to share well wishes and encouragements with the graduating client. Counselor prompted graduating client to share takeaways, reflect on progress and final thoughts for the group. Client endorsed safety plan to be followed to prevent safety issues.   Assessment and Plan: Clinician recommends that Client step down from IOP to individual treatment. Clinician recommends adherence to crisis/safety plan, taking medications as prescribed, and following up with medical professionals if any issues arise.    Follow Up Instructions: Clinician will send Webex link for next session. The Client was advised to call back or seek an in-person evaluation if the symptoms worsen or if the condition fails to improve as anticipated.     I provided 180 minutes of non-face-to-face time during this encounter.     Hilbert Odor, LCSW

## 2020-09-26 NOTE — Patient Instructions (Signed)
D:  Patient completed MH-IOP today.  A:  Discharge today.  Follow up with Dr. Enriqueta Shutter and EAP.  Patient to call for appointments.  Encouraged support groups thru [facility] [phone]).  R:  Patient receptive.

## 2020-09-30 ENCOUNTER — Other Ambulatory Visit (HOSPITAL_COMMUNITY): Payer: 59

## 2020-09-30 ENCOUNTER — Other Ambulatory Visit: Payer: Self-pay

## 2020-10-01 ENCOUNTER — Ambulatory Visit (HOSPITAL_COMMUNITY): Payer: 59

## 2020-10-02 ENCOUNTER — Ambulatory Visit (HOSPITAL_COMMUNITY): Payer: 59

## 2020-10-03 ENCOUNTER — Ambulatory Visit (HOSPITAL_COMMUNITY): Payer: 59

## 2020-11-07 ENCOUNTER — Other Ambulatory Visit: Payer: Managed Care, Other (non HMO)

## 2020-11-11 ENCOUNTER — Other Ambulatory Visit: Payer: Self-pay | Admitting: Internal Medicine

## 2020-11-11 NOTE — Telephone Encounter (Signed)
  Notes to clinic:  Verify for continued use and refill Looks like medication may have not been working for patient    Requested Prescriptions  Pending Prescriptions Disp Refills   verapamil (CALAN-SR) 120 MG CR tablet [Pharmacy Med Name: VERAPAMIL HCL ER 120 MG TAB] 30 tablet 2    Sig: TAKE 1 TABLET BY MOUTH AT BEDTIME     Cardiovascular:  Calcium Channel Blockers Passed - 11/11/2020 10:45 AM      Passed - Last BP in normal range    BP Readings from Last 1 Encounters:  09/19/20 120/75          Passed - Valid encounter within last 6 months    Recent Outpatient Visits           2 months ago Abnormal CBC   [facility] Vigg, Avanti, MD   3 months ago Bipolar affective disorder, currently depressed, mild (HCC)   [facility] Vigg, Avanti, MD   9 months ago Body aches after vaccination   [facility] Valentino Nose, NP   1 year ago Viral URI with cough   [facility] Particia Nearing, [place]   1 year ago Acute non-recurrent maxillary sinusitis   [facility] Valentino Nose, NP

## 2020-11-11 NOTE — Telephone Encounter (Signed)
Looks like he's overdue for a follow up- please schedule with Dr. Charlotta Newton when she returns and I'll give him a refill until then.

## 2020-11-11 NOTE — Telephone Encounter (Signed)
Patient last seen 07/22/20

## 2020-11-14 ENCOUNTER — Ambulatory Visit: Payer: Managed Care, Other (non HMO) | Admitting: Internal Medicine

## 2020-12-19 ENCOUNTER — Encounter: Payer: Self-pay | Admitting: General Surgery

## 2020-12-26 ENCOUNTER — Other Ambulatory Visit: Payer: Self-pay

## 2020-12-29 MED ORDER — VERAPAMIL HCL ER 120 MG PO TBCR: 120.0000 mg | EXTENDED_RELEASE_TABLET | Freq: Every day | ORAL | 1 refills | Status: DC

## 2020-12-30 ENCOUNTER — Other Ambulatory Visit: Payer: Self-pay

## 2020-12-30 ENCOUNTER — Ambulatory Visit (INDEPENDENT_AMBULATORY_CARE_PROVIDER_SITE_OTHER): Payer: Managed Care, Other (non HMO) | Admitting: Internal Medicine

## 2020-12-30 ENCOUNTER — Encounter: Payer: Self-pay | Admitting: Internal Medicine

## 2020-12-30 ENCOUNTER — Ambulatory Visit: Payer: Managed Care, Other (non HMO) | Admitting: Internal Medicine

## 2020-12-30 VITALS — BP 122/78 | HR 75 | Ht 68.0 in | Wt 169.0 lb

## 2020-12-30 DIAGNOSIS — G43809 Other migraine, not intractable, without status migrainosus: Secondary | ICD-10-CM

## 2020-12-30 DIAGNOSIS — F319 Bipolar disorder, unspecified: Secondary | ICD-10-CM | POA: Diagnosis not present

## 2020-12-30 DIAGNOSIS — G43909 Migraine, unspecified, not intractable, without status migrainosus: Secondary | ICD-10-CM | POA: Insufficient documentation

## 2020-12-30 MED ORDER — NICOTINE 21 MG/24HR TD PT24: 21.0000 mg | MEDICATED_PATCH | Freq: Every day | TRANSDERMAL | 0 refills | Status: DC

## 2020-12-30 MED ORDER — SUMATRIPTAN SUCCINATE 25 MG PO TABS: 25.0000 mg | ORAL_TABLET | ORAL | 2 refills | Status: DC | PRN

## 2020-12-30 NOTE — Progress Notes (Signed)
BP 122/78   Pulse 75   Ht 5\' 8"  (1.727 m)   Wt 169 lb (76.7 kg)   SpO2 96%   BMI 25.70 kg/m    Subjective:    Patient ID: , male    DOB: [birth year], 35 y.o.   MRN: [id number]  Chief Complaint  Patient presents with   Hypertension   Migraine    HPI: Joel Carr is a 35 y.o. male  June 12th had fell from a step stool had sandals and popped Was bedridden for 3 months sec to multiple fractures 1st , 2nd and 3rd metatarsals and had pins from the outside. Was out off  work x 17 weeks. June 24th had surgery   Hypertension This is a chronic problem. The current episode started more than 1 month ago.  Migraine  This is a chronic (ha sbeen on verapamil x 6 months and has had only 3 episodes since statting, before was having more than one) problem. His past medical history is significant for hypertension.   Chief Complaint  Patient presents with   Hypertension   Migraine    Relevant past medical, surgical, family and social history reviewed and updated as indicated. Interim medical history since our last visit reviewed. Allergies and medications reviewed and updated.  Review of Systems  Per HPI unless specifically indicated above     Objective:    BP 122/78   Pulse 75   Ht 5\' 8"  (1.727 m)   Wt 169 lb (76.7 kg)   SpO2 96%   BMI 25.70 kg/m   Wt Readings from Last 3 Encounters:  12/30/20 169 lb (76.7 kg)  09/19/20 168 lb (76.2 kg)  09/15/20 168 lb (76.2 kg)    Physical Exam Vitals and nursing note reviewed.  Constitutional:      General: He is not in acute distress.    Appearance: Normal appearance. He is not ill-appearing or diaphoretic.  HENT:     Head: Normocephalic and atraumatic.     Right Ear: Tympanic membrane and external ear normal. There is no impacted cerumen.     Left Ear: External ear normal.     Nose: No congestion or rhinorrhea.     Mouth/Throat:     Pharynx: No oropharyngeal exudate or posterior oropharyngeal erythema.  Eyes:      Conjunctiva/sclera: Conjunctivae normal.     Pupils: Pupils are equal, round, and reactive to light.  Cardiovascular:     Rate and Rhythm: Normal rate and regular rhythm.     Heart sounds: No murmur heard.   No friction rub. No gallop.  Pulmonary:     Effort: No respiratory distress.     Breath sounds: No stridor. No wheezing or rhonchi.  Chest:     Chest wall: No tenderness.  Abdominal:     General: Abdomen is flat. Bowel sounds are normal.     Palpations: Abdomen is soft. There is no mass.     Tenderness: There is no abdominal tenderness.  Musculoskeletal:     Cervical back: Normal range of motion and neck supple. No rigidity or tenderness.     Left lower leg: No edema.  Skin:    General: Skin is warm and dry.  Neurological:     Mental Status: He is alert.  Psychiatric:        Mood and Affect: Mood normal.    Results for orders placed or performed during the hospital encounter of 08/20/20  Comprehensive metabolic panel  Result Value Ref Range   Sodium 140 135 - 145 mmol/L   Potassium 3.8 3.5 - 5.1 mmol/L   Chloride 106 98 - 111 mmol/L   CO2 25 22 - 32 mmol/L   Glucose, Bld 104 (H) 70 - 99 mg/dL   BUN 9 6 - 20 mg/dL   Creatinine, Ser 4.43 0.61 - 1.24 mg/dL   Calcium 9.4 8.9 - 15.4 mg/dL   Total Protein 7.0 6.5 - 8.1 g/dL   Albumin 4.5 3.5 - 5.0 g/dL   AST 18 15 - 41 U/L   ALT 13 0 - 44 U/L   Alkaline Phosphatase 54 38 - 126 U/L   Total Bilirubin 0.5 0.3 - 1.2 mg/dL   GFR, Estimated >00 >86 mL/min   Anion gap 9 5 - 15  Ethanol  Result Value Ref Range   Alcohol, Ethyl (B) <10 <10 mg/dL  Salicylate level  Result Value Ref Range   Salicylate Lvl <7.0 (L) 7.0 - 30.0 mg/dL  Acetaminophen level  Result Value Ref Range   Acetaminophen (Tylenol), Serum <10 (L) 10 - 30 ug/mL  cbc  Result Value Ref Range   WBC 7.8 4.0 - 10.5 K/uL   RBC 4.91 4.22 - 5.81 MIL/uL   Hemoglobin 15.3 13.0 - 17.0 g/dL   HCT 76.1 95.0 - 93.2 %   MCV 88.4 80.0 - 100.0 fL   MCH 31.2 26.0 -  34.0 pg   MCHC 35.3 30.0 - 36.0 g/dL   RDW 67.1 24.5 - 80.9 %   Platelets 183 150 - 400 K/uL   nRBC 0.0 0.0 - 0.2 %  Urine Drug Screen, Qualitative  Result Value Ref Range   Tricyclic, Ur Screen NONE DETECTED NONE DETECTED   Amphetamines, Ur Screen NONE DETECTED NONE DETECTED   MDMA (Ecstasy)Ur Screen NONE DETECTED NONE DETECTED   Cocaine Metabolite,Ur [place] NONE DETECTED NONE DETECTED   Opiate, Ur Screen NONE DETECTED NONE DETECTED   Phencyclidine (PCP) Ur S NONE DETECTED NONE DETECTED   Cannabinoid 50 Ng, Ur [place] NONE DETECTED NONE DETECTED   Barbiturates, Ur Screen NONE DETECTED NONE DETECTED   Benzodiazepine, Ur Scrn NONE DETECTED NONE DETECTED   Methadone Scn, Ur NONE DETECTED NONE DETECTED        Current Outpatient Medications:    albuterol (VENTOLIN HFA) 108 (90 Base) MCG/ACT inhaler, Inhale 2 puffs into the lungs every 6 (six) hours as needed for wheezing or shortness of breath., Disp: 18 g, Rfl: 0   clonazePAM (KLONOPIN) 0.5 MG tablet, Take 1 tablet (0.5 mg total) by mouth daily as needed for anxiety., Disp: 30 tablet, Rfl: 0   gabapentin (NEURONTIN) 100 MG capsule, Take 200 mg by mouth 2 (two) times daily., Disp: , Rfl:    lamoTRIgine (LAMICTAL) 100 MG tablet, Take 1 tablet (100 mg total) by mouth 2 (two) times daily., Disp: 180 tablet, Rfl: 0   loratadine (CLARITIN) 10 MG tablet, Take 10 mg by mouth daily as needed for allergies., Disp: , Rfl:    lurasidone (LATUDA) 80 MG TABS tablet, Take 80 mg by mouth every evening., Disp: , Rfl:    Multiple Vitamins-Minerals (MULTIVITAMIN WITH MINERALS) tablet, Take 1 tablet by mouth daily., Disp: , Rfl:    ondansetron (ZOFRAN) 4 MG tablet, Take 1 tablet (4 mg total) by mouth daily as needed for nausea or vomiting., Disp: 30 tablet, Rfl: 1   QUEtiapine (SEROQUEL) 25 MG tablet, Take 25-50 mg by mouth at bedtime as needed (sleep)., Disp: , Rfl:  SUMAtriptan (IMITREX) 25 MG tablet, Take 1 tablet (25 mg total) by mouth every 2 (two) hours as  needed for migraine (NOT TO EXCEED 100 MG). May repeat in 2 hours if headache persists or recurs., Disp: 10 tablet, Rfl: 2   verapamil (CALAN-SR) 120 MG CR tablet, Take 1 tablet (120 mg total) by mouth at bedtime., Disp: 90 tablet, Rfl: 1    Assessment & Plan:  BPD : Sees psych for latuda, seroquel at night, lamictal for the above.  Mood better , has been dealing with this for a while.  2. Left foot pain is on tyelnol and ibubrufen for pain   LEFT FOOT - COMPLETE 3+ VIEW COMPARISON:  None. FINDINGS: There is an oblique displaced fracture through the proximal to mid shaft of the left 3rd metatarsal. Fractures also noted within the proximal 4th metatarsal. There appears to be dislocation of the 2nd through 4th tarsal metatarsal joints. Findings compatible with Lisfranc injury. IMPRESSION: Lisfranc injury as described above. Fractures involving the 3rd and 4th metatarsals with dislocations of the 2nd through 4th tarsal metatarsal joints.    3. Asthma is on breztri for such Down to 1 cig a day  Smoking cessation  Smoking cessation advces has 14 mg nicotine patches to start with 21 mg and clal back for refills / starting 7 mg  failed nicotine patches in the past. continues to smoke. more than > 5 - 10 mins of time was spent with pt regarding smoking cessation and complications.          Problem List Items Addressed This Visit   None    No orders of the defined types were placed in this encounter.    No orders of the defined types were placed in this encounter.    Follow up plan: No follow-ups on file.

## 2021-01-23 ENCOUNTER — Other Ambulatory Visit: Payer: Self-pay

## 2021-01-23 ENCOUNTER — Other Ambulatory Visit: Payer: Managed Care, Other (non HMO)

## 2021-01-23 DIAGNOSIS — E782 Mixed hyperlipidemia: Secondary | ICD-10-CM

## 2021-01-23 DIAGNOSIS — G43809 Other migraine, not intractable, without status migrainosus: Secondary | ICD-10-CM

## 2021-01-23 DIAGNOSIS — F319 Bipolar disorder, unspecified: Secondary | ICD-10-CM

## 2021-01-23 LAB — URINALYSIS, ROUTINE W REFLEX MICROSCOPIC
Bilirubin, UA: NEGATIVE
Glucose, UA: NEGATIVE
Ketones, UA: NEGATIVE
Leukocytes,UA: NEGATIVE
Nitrite, UA: NEGATIVE
Protein,UA: NEGATIVE
RBC, UA: NEGATIVE
Specific Gravity, UA: 1.025 (ref 1.005–1.030)
Urobilinogen, Ur: 0.2 mg/dL (ref 0.2–1.0)
pH, UA: 6 (ref 5.0–7.5)

## 2021-01-24 LAB — CBC WITH DIFFERENTIAL/PLATELET
Basophils Absolute: 0 10*3/uL (ref 0.0–0.2)
Basos: 1 %
EOS (ABSOLUTE): 0.1 10*3/uL (ref 0.0–0.4)
Eos: 1 %
Hematocrit: 42.4 % (ref 37.5–51.0)
Hemoglobin: 14.4 g/dL (ref 13.0–17.7)
Immature Grans (Abs): 0 10*3/uL (ref 0.0–0.1)
Immature Granulocytes: 0 %
Lymphocytes Absolute: 3 10*3/uL (ref 0.7–3.1)
Lymphs: 46 %
MCH: 30.1 pg (ref 26.6–33.0)
MCHC: 34 g/dL (ref 31.5–35.7)
MCV: 89 fL (ref 79–97)
Monocytes Absolute: 0.5 10*3/uL (ref 0.1–0.9)
Monocytes: 8 %
Neutrophils Absolute: 2.8 10*3/uL (ref 1.4–7.0)
Neutrophils: 44 %
Platelets: 229 10*3/uL (ref 150–450)
RBC: 4.78 x10E6/uL (ref 4.14–5.80)
RDW: 12.9 % (ref 11.6–15.4)
WBC: 6.5 10*3/uL (ref 3.4–10.8)

## 2021-01-24 LAB — COMPREHENSIVE METABOLIC PANEL
ALT: 18 IU/L (ref 0–44)
AST: 21 IU/L (ref 0–40)
Albumin/Globulin Ratio: 2.4 — ABNORMAL HIGH (ref 1.2–2.2)
Albumin: 4.7 g/dL (ref 4.0–5.0)
Alkaline Phosphatase: 85 IU/L (ref 44–121)
BUN/Creatinine Ratio: 13 (ref 9–20)
BUN: 11 mg/dL (ref 6–20)
Bilirubin Total: 0.4 mg/dL (ref 0.0–1.2)
CO2: 21 mmol/L (ref 20–29)
Calcium: 9.9 mg/dL (ref 8.7–10.2)
Chloride: 104 mmol/L (ref 96–106)
Creatinine, Ser: 0.88 mg/dL (ref 0.76–1.27)
Globulin, Total: 2 g/dL (ref 1.5–4.5)
Glucose: 82 mg/dL (ref 70–99)
Potassium: 4.3 mmol/L (ref 3.5–5.2)
Sodium: 142 mmol/L (ref 134–144)
Total Protein: 6.7 g/dL (ref 6.0–8.5)
eGFR: 114 mL/min/{1.73_m2} (ref >59)

## 2021-01-24 LAB — LIPID PANEL
Chol/HDL Ratio: 5.9 ratio — ABNORMAL HIGH (ref 0.0–5.0)
Cholesterol, Total: 260 mg/dL — ABNORMAL HIGH (ref 100–199)
HDL: 44 mg/dL (ref >39)
LDL Chol Calc (NIH): 178 mg/dL — ABNORMAL HIGH (ref 0–99)
Triglycerides: 203 mg/dL — ABNORMAL HIGH (ref 0–149)
VLDL Cholesterol Cal: 38 mg/dL (ref 5–40)

## 2021-01-24 LAB — TSH: TSH: 0.679 u[IU]/mL (ref 0.450–4.500)

## 2021-01-30 ENCOUNTER — Other Ambulatory Visit: Payer: Self-pay

## 2021-01-30 ENCOUNTER — Ambulatory Visit (INDEPENDENT_AMBULATORY_CARE_PROVIDER_SITE_OTHER): Payer: Managed Care, Other (non HMO) | Admitting: Internal Medicine

## 2021-01-30 ENCOUNTER — Encounter: Payer: Self-pay | Admitting: Internal Medicine

## 2021-01-30 VITALS — BP 112/73 | HR 67 | Temp 98.8°F | Ht 67.99 in | Wt 173.2 lb

## 2021-01-30 DIAGNOSIS — Z23 Encounter for immunization: Secondary | ICD-10-CM | POA: Insufficient documentation

## 2021-01-30 DIAGNOSIS — E782 Mixed hyperlipidemia: Secondary | ICD-10-CM

## 2021-01-30 DIAGNOSIS — G43809 Other migraine, not intractable, without status migrainosus: Secondary | ICD-10-CM

## 2021-01-30 DIAGNOSIS — Z0000 Encounter for general adult medical examination without abnormal findings: Secondary | ICD-10-CM

## 2021-01-30 MED ORDER — ATORVASTATIN CALCIUM 40 MG PO TABS: 40.0000 mg | ORAL_TABLET | Freq: Every day | ORAL | 3 refills | Status: DC

## 2021-01-30 NOTE — Progress Notes (Signed)
BP 112/73   Pulse 67   Temp 98.8 F (37.1 C) (Oral)   Ht 5' 7.99" (1.727 m)   Wt 173 lb 3.2 oz (78.6 kg)   SpO2 98%   BMI 26.34 kg/m    Subjective:    Patient ID: Joel Carr, male    DOB: 06-Mar-1986, 35 y.o.   MRN: [id number]  Chief Complaint  Patient presents with   Annual Exam    HPI: Joel Carr is a 35 y.o. male  Pt is here for a physical Has had a foot injury s/p surgery for 3rd, 4the and 5th metatarsals in June. Had been on reduced shceudle for about 3 months, went back full time since October. Feels well now pain off and on sec to steel toed shoes. Had Short term disability for such was off 21 weeks   Depression        This is a chronic (stable seeing psych for such) problem.  Associated symptoms include no decreased concentration, no fatigue, no appetite change, no myalgias, no headaches and no suicidal ideas.  Chief Complaint  Patient presents with   Annual Exam    Relevant past medical, surgical, family and social history reviewed and updated as indicated. Interim medical history since our last visit reviewed. Allergies and medications reviewed and updated.  Review of Systems  Constitutional:  Negative for activity change, appetite change, chills, fatigue and fever.  HENT:  Negative for congestion, ear discharge, ear pain and facial swelling.   Eyes:  Negative for pain, discharge and itching.  Respiratory:  Negative for cough, chest tightness, shortness of breath and wheezing.   Cardiovascular:  Negative for chest pain, palpitations and leg swelling.  Gastrointestinal:  Negative for abdominal distention, abdominal pain, blood in stool, constipation, diarrhea, nausea and vomiting.  Endocrine: Negative for cold intolerance, heat intolerance, polydipsia, polyphagia and polyuria.  Genitourinary:  Negative for difficulty urinating, dysuria, flank pain, frequency, hematuria and urgency.  Musculoskeletal:  Negative for arthralgias, gait problem, joint swelling  and myalgias.  Skin:  Negative for color change, rash and wound.  Neurological:  Negative for dizziness, tremors, speech difficulty, weakness, light-headedness, numbness and headaches.  Hematological:  Does not bruise/bleed easily.  Psychiatric/Behavioral:  Positive for depression. Negative for agitation, confusion, decreased concentration, sleep disturbance and suicidal ideas.    Per HPI unless specifically indicated above     Objective:    BP 112/73   Pulse 67   Temp 98.8 F (37.1 C) (Oral)   Ht 5' 7.99" (1.727 m)   Wt 173 lb 3.2 oz (78.6 kg)   SpO2 98%   BMI 26.34 kg/m   Wt Readings from Last 3 Encounters:  01/30/21 173 lb 3.2 oz (78.6 kg)  12/30/20 169 lb (76.7 kg)  09/19/20 168 lb (76.2 kg)    Physical Exam Vitals and nursing note reviewed.  Constitutional:      General: He is not in acute distress.    Appearance: Normal appearance. He is not ill-appearing or diaphoretic.  HENT:     Head: Normocephalic and atraumatic.     Right Ear: Tympanic membrane and external ear normal. There is no impacted cerumen.     Left Ear: External ear normal.     Nose: No congestion or rhinorrhea.     Mouth/Throat:     Pharynx: No oropharyngeal exudate or posterior oropharyngeal erythema.  Eyes:     Conjunctiva/sclera: Conjunctivae normal.     Pupils: Pupils are equal, round, and reactive to light.  Cardiovascular:     Rate and Rhythm: Normal rate and regular rhythm.     Heart sounds: No murmur heard.   No friction rub. No gallop.  Pulmonary:     Effort: No respiratory distress.     Breath sounds: No stridor. No wheezing or rhonchi.  Chest:     Chest wall: No tenderness.  Abdominal:     General: Abdomen is flat. Bowel sounds are normal. There is no distension.     Palpations: Abdomen is soft. There is no mass.     Tenderness: There is no abdominal tenderness. There is no guarding.  Musculoskeletal:        General: No swelling or deformity.     Cervical back: Normal range of  motion and neck supple. No rigidity or tenderness.     Right lower leg: No edema.     Left lower leg: No edema.  Skin:    General: Skin is warm and dry.     Coloration: Skin is not jaundiced.     Findings: No erythema.  Neurological:     Mental Status: He is alert and oriented to person, place, and time. Mental status is at baseline.  Psychiatric:        Mood and Affect: Mood normal.        Behavior: Behavior normal.        Thought Content: Thought content normal.        Judgment: Judgment normal.    Results for orders placed or performed in visit on 01/23/21  Urinalysis, Routine w reflex microscopic  Result Value Ref Range   Specific Gravity, UA 1.025 1.005 - 1.030   pH, UA 6.0 5.0 - 7.5   Color, UA Yellow Yellow   Appearance Ur Clear Clear   Leukocytes,UA Negative Negative   Protein,UA Negative Negative/Trace   Glucose, UA Negative Negative   Ketones, UA Negative Negative   RBC, UA Negative Negative   Bilirubin, UA Negative Negative   Urobilinogen, Ur 0.2 0.2 - 1.0 mg/dL   Nitrite, UA Negative Negative  CBC with Differential/Platelet  Result Value Ref Range   WBC 6.5 3.4 - 10.8 x10E3/uL   RBC 4.78 4.14 - 5.80 x10E6/uL   Hemoglobin 14.4 13.0 - 17.7 g/dL   Hematocrit 42.4 37.5 - 51.0 %   MCV 89 79 - 97 fL   MCH 30.1 26.6 - 33.0 pg   MCHC 34.0 31.5 - 35.7 g/dL   RDW 12.9 11.6 - 15.4 %   Platelets 229 150 - 450 x10E3/uL   Neutrophils 44 Not Estab. %   Lymphs 46 Not Estab. %   Monocytes 8 Not Estab. %   Eos 1 Not Estab. %   Basos 1 Not Estab. %   Neutrophils Absolute 2.8 1.4 - 7.0 x10E3/uL   Lymphocytes Absolute 3.0 0.7 - 3.1 x10E3/uL   Monocytes Absolute 0.5 0.1 - 0.9 x10E3/uL   EOS (ABSOLUTE) 0.1 0.0 - 0.4 x10E3/uL   Basophils Absolute 0.0 0.0 - 0.2 x10E3/uL   Immature Granulocytes 0 Not Estab. %   Immature Grans (Abs) 0.0 0.0 - 0.1 x10E3/uL  TSH  Result Value Ref Range   TSH 0.679 0.450 - 4.500 uIU/mL  Comprehensive metabolic panel  Result Value Ref Range    Glucose 82 70 - 99 mg/dL   BUN 11 6 - 20 mg/dL   Creatinine, Ser 0.88 0.76 - 1.27 mg/dL   eGFR 114 >59 mL/min/1.73   BUN/Creatinine Ratio 13 9 - 20   Sodium 142  134 - 144 mmol/L   Potassium 4.3 3.5 - 5.2 mmol/L   Chloride 104 96 - 106 mmol/L   CO2 21 20 - 29 mmol/L   Calcium 9.9 8.7 - 10.2 mg/dL   Total Protein 6.7 6.0 - 8.5 g/dL   Albumin 4.7 4.0 - 5.0 g/dL   Globulin, Total 2.0 1.5 - 4.5 g/dL   Albumin/Globulin Ratio 2.4 (H) 1.2 - 2.2   Bilirubin Total 0.4 0.0 - 1.2 mg/dL   Alkaline Phosphatase 85 44 - 121 IU/L   AST 21 0 - 40 IU/L   ALT 18 0 - 44 IU/L  Lipid panel  Result Value Ref Range   Cholesterol, Total 260 (H) 100 - 199 mg/dL   Triglycerides 203 (H) 0 - 149 mg/dL   HDL 44 >39 mg/dL   VLDL Cholesterol Cal 38 5 - 40 mg/dL   LDL Chol Calc (NIH) 178 (H) 0 - 99 mg/dL   Chol/HDL Ratio 5.9 (H) 0.0 - 5.0 ratio        Current Outpatient Medications:    clonazePAM (KLONOPIN) 0.5 MG tablet, Take 1 tablet (0.5 mg total) by mouth daily as needed for anxiety., Disp: 30 tablet, Rfl: 0   gabapentin (NEURONTIN) 100 MG capsule, Take 200 mg by mouth 2 (two) times daily., Disp: , Rfl:    lamoTRIgine (LAMICTAL) 100 MG tablet, Take 1 tablet (100 mg total) by mouth 2 (two) times daily., Disp: 180 tablet, Rfl: 0   loratadine (CLARITIN) 10 MG tablet, Take 10 mg by mouth daily as needed for allergies., Disp: , Rfl:    lurasidone (LATUDA) 80 MG TABS tablet, Take 80 mg by mouth every evening., Disp: , Rfl:    Multiple Vitamins-Minerals (MULTIVITAMIN WITH MINERALS) tablet, Take 1 tablet by mouth daily., Disp: , Rfl:    QUEtiapine (SEROQUEL) 25 MG tablet, Take 25-50 mg by mouth at bedtime as needed (sleep)., Disp: , Rfl:    SUMAtriptan (IMITREX) 25 MG tablet, Take 1 tablet (25 mg total) by mouth every 2 (two) hours as needed for migraine (NOT TO EXCEED 100 MG). May repeat in 2 hours if headache persists or recurs., Disp: 20 tablet, Rfl: 2   verapamil (CALAN-SR) 120 MG CR tablet, Take 1 tablet (120  mg total) by mouth at bedtime., Disp: 90 tablet, Rfl: 1    Assessment & Plan:  HLD add lipitor q pm call if ms cramps  recheck FLP, check LFT's work on diet, SE of meds explained to pt. low fat and high fiber diet explained to pt.   Ref. Range 01/23/2021 09:55  Total CHOL/HDL Ratio Latest Ref Range: 0.0 - 5.0 ratio 5.9 (H)  Cholesterol, Total Latest Ref Range: 100 - 199 mg/dL 260 (H)  HDL Cholesterol Latest Ref Range: >39 mg/dL 44  Triglycerides Latest Ref Range: 0 - 149 mg/dL 203 (H)  VLDL Cholesterol Cal Latest Ref Range: 5 - 40 mg/dL 38  LDL Chol Calc (NIH) Latest Ref Range: 0 - 99 mg/dL 178 (H)   2. RLS :  Is on gabapentin  Helps with RLS per pt much improved symptomatically   3. Migraines : Will continue imitrex as needed and veramamil for such helps  Contimue   4. Physical PHYSICAL :  Physical Wnl Labs d/w pt   Problem List Items Addressed This Visit   None Visit Diagnoses     Need for influenza vaccination    -  Primary   Relevant Orders   Flu Vaccine QUAD 84moIM (Fluarix, Fluzone & Alfiuria  Quad PF) (Completed)        Orders Placed This Encounter  Procedures   Flu Vaccine QUAD 54moIM (Fluarix, Fluzone & Alfiuria Quad PF)     No orders of the defined types were placed in this encounter.    Follow up plan: No follow-ups on file.

## 2021-02-09 ENCOUNTER — Other Ambulatory Visit: Payer: Self-pay | Admitting: Internal Medicine

## 2021-02-09 NOTE — Telephone Encounter (Signed)
Requested medications are due for refill today.  yes  Requested medications are on the active medications list.  no  Last refill. 7/242020  Future visit scheduled.   yes  Notes to clinic.  Med not on med list.

## 2021-02-13 NOTE — Telephone Encounter (Signed)
Pt states he will wait for her appt in Dec.

## 2021-02-17 ENCOUNTER — Encounter: Payer: Self-pay | Admitting: Internal Medicine

## 2021-02-17 ENCOUNTER — Other Ambulatory Visit: Payer: Self-pay

## 2021-02-17 ENCOUNTER — Ambulatory Visit (INDEPENDENT_AMBULATORY_CARE_PROVIDER_SITE_OTHER): Payer: Managed Care, Other (non HMO) | Admitting: Internal Medicine

## 2021-02-17 VITALS — BP 111/73 | HR 76 | Temp 97.7°F | Ht 67.99 in | Wt 182.8 lb

## 2021-02-17 DIAGNOSIS — M79672 Pain in left foot: Secondary | ICD-10-CM | POA: Diagnosis not present

## 2021-02-17 MED ORDER — MELOXICAM 7.5 MG PO TABS: 7.5000 mg | ORAL_TABLET | Freq: Every day | ORAL | 1 refills | Status: DC

## 2021-02-17 MED ORDER — GABAPENTIN 300 MG PO CAPS: 300.0000 mg | ORAL_CAPSULE | Freq: Every day | ORAL | 3 refills | Status: DC

## 2021-02-17 NOTE — Progress Notes (Signed)
BP 111/73   Pulse 76   Temp 97.7 F (36.5 C) (Oral)   Ht 5' 7.99" (1.727 m)   Wt 182 lb 12.8 oz (82.9 kg)   SpO2 98%   BMI 27.80 kg/m    Subjective:    Patient ID: Joel Carr, male    DOB: 03-23-1986, 35 y.o.   MRN: [id number]  Chief Complaint  Patient presents with  . Foot Pain    Patient is having left foot pain post surgery, surgery was on 09/07/20.  Was told by Surgeon to seek out PCP for pain management    HPI: Joel Carr is a 35 y.o. male  Foot Pain This is a chronic (has pain when he walks ,is on naproxen seen by dr. Luana Shu @ ortho) problem. The current episode started more than 1 year ago.   Chief Complaint  Patient presents with  . Foot Pain    Patient is having left foot pain post surgery, surgery was on 09/07/20.  Was told by Surgeon to seek out PCP for pain management    Relevant past medical, surgical, family and social history reviewed and updated as indicated. Interim medical history since our last visit reviewed. Allergies and medications reviewed and updated.  Review of Systems  Per HPI unless specifically indicated above     Objective:    BP 111/73   Pulse 76   Temp 97.7 F (36.5 C) (Oral)   Ht 5' 7.99" (1.727 m)   Wt 182 lb 12.8 oz (82.9 kg)   SpO2 98%   BMI 27.80 kg/m   Wt Readings from Last 3 Encounters:  02/17/21 182 lb 12.8 oz (82.9 kg)  01/30/21 173 lb 3.2 oz (78.6 kg)  12/30/20 169 lb (76.7 kg)    Physical Exam Vitals and nursing note reviewed.  Constitutional:      General: He is not in acute distress.    Appearance: Normal appearance. He is not ill-appearing or diaphoretic.  HENT:     Head: Normocephalic and atraumatic.  Musculoskeletal:        General: Tenderness present. No deformity or signs of injury.     Cervical back: Normal range of motion and neck supple. No rigidity or tenderness.     Right lower leg: No edema.     Left lower leg: No edema.     Comments: Pain on the lateral part of the left foot  Skin:     General: Skin is warm and dry.  Neurological:     Mental Status: He is alert.    Results for orders placed or performed in visit on 01/23/21  Urinalysis, Routine w reflex microscopic  Result Value Ref Range   Specific Gravity, UA 1.025 1.005 - 1.030   pH, UA 6.0 5.0 - 7.5   Color, UA Yellow Yellow   Appearance Ur Clear Clear   Leukocytes,UA Negative Negative   Protein,UA Negative Negative/Trace   Glucose, UA Negative Negative   Ketones, UA Negative Negative   RBC, UA Negative Negative   Bilirubin, UA Negative Negative   Urobilinogen, Ur 0.2 0.2 - 1.0 mg/dL   Nitrite, UA Negative Negative  CBC with Differential/Platelet  Result Value Ref Range   WBC 6.5 3.4 - 10.8 x10E3/uL   RBC 4.78 4.14 - 5.80 x10E6/uL   Hemoglobin 14.4 13.0 - 17.7 g/dL   Hematocrit 42.4 37.5 - 51.0 %   MCV 89 79 - 97 fL   MCH 30.1 26.6 - 33.0 pg   MCHC  34.0 31.5 - 35.7 g/dL   RDW 12.9 11.6 - 15.4 %   Platelets 229 150 - 450 x10E3/uL   Neutrophils 44 Not Estab. %   Lymphs 46 Not Estab. %   Monocytes 8 Not Estab. %   Eos 1 Not Estab. %   Basos 1 Not Estab. %   Neutrophils Absolute 2.8 1.4 - 7.0 x10E3/uL   Lymphocytes Absolute 3.0 0.7 - 3.1 x10E3/uL   Monocytes Absolute 0.5 0.1 - 0.9 x10E3/uL   EOS (ABSOLUTE) 0.1 0.0 - 0.4 x10E3/uL   Basophils Absolute 0.0 0.0 - 0.2 x10E3/uL   Immature Granulocytes 0 Not Estab. %   Immature Grans (Abs) 0.0 0.0 - 0.1 x10E3/uL  TSH  Result Value Ref Range   TSH 0.679 0.450 - 4.500 uIU/mL  Comprehensive metabolic panel  Result Value Ref Range   Glucose 82 70 - 99 mg/dL   BUN 11 6 - 20 mg/dL   Creatinine, Ser 0.88 0.76 - 1.27 mg/dL   eGFR 114 >59 mL/min/1.73   BUN/Creatinine Ratio 13 9 - 20   Sodium 142 134 - 144 mmol/L   Potassium 4.3 3.5 - 5.2 mmol/L   Chloride 104 96 - 106 mmol/L   CO2 21 20 - 29 mmol/L   Calcium 9.9 8.7 - 10.2 mg/dL   Total Protein 6.7 6.0 - 8.5 g/dL   Albumin 4.7 4.0 - 5.0 g/dL   Globulin, Total 2.0 1.5 - 4.5 g/dL   Albumin/Globulin Ratio  2.4 (H) 1.2 - 2.2   Bilirubin Total 0.4 0.0 - 1.2 mg/dL   Alkaline Phosphatase 85 44 - 121 IU/L   AST 21 0 - 40 IU/L   ALT 18 0 - 44 IU/L  Lipid panel  Result Value Ref Range   Cholesterol, Total 260 (H) 100 - 199 mg/dL   Triglycerides 203 (H) 0 - 149 mg/dL   HDL 44 >39 mg/dL   VLDL Cholesterol Cal 38 5 - 40 mg/dL   LDL Chol Calc (NIH) 178 (H) 0 - 99 mg/dL   Chol/HDL Ratio 5.9 (H) 0.0 - 5.0 ratio        Current Outpatient Medications:  .  atorvastatin (LIPITOR) 40 MG tablet, Take 1 tablet (40 mg total) by mouth daily., Disp: 30 tablet, Rfl: 3 .  clonazePAM (KLONOPIN) 0.5 MG tablet, Take 1 tablet (0.5 mg total) by mouth daily as needed for anxiety., Disp: 30 tablet, Rfl: 0 .  diazepam (VALIUM) 10 MG tablet, , Disp: , Rfl:  .  diclofenac Sodium (VOLTAREN) 1 % GEL, Apply topically., Disp: , Rfl:  .  gabapentin (NEURONTIN) 100 MG capsule, Take 200 mg by mouth 2 (two) times daily., Disp: , Rfl:  .  lamoTRIgine (LAMICTAL) 100 MG tablet, Take 1 tablet (100 mg total) by mouth 2 (two) times daily., Disp: 180 tablet, Rfl: 0 .  loratadine (CLARITIN) 10 MG tablet, Take 10 mg by mouth daily as needed for allergies., Disp: , Rfl:  .  lurasidone (LATUDA) 80 MG TABS tablet, Take 80 mg by mouth every evening., Disp: , Rfl:  .  Multiple Vitamins-Minerals (MULTIVITAMIN WITH MINERALS) tablet, Take 1 tablet by mouth daily., Disp: , Rfl:  .  QUEtiapine (SEROQUEL) 25 MG tablet, Take 25-50 mg by mouth at bedtime as needed (sleep)., Disp: , Rfl:  .  SUMAtriptan (IMITREX) 25 MG tablet, Take 1 tablet (25 mg total) by mouth every 2 (two) hours as needed for migraine (NOT TO EXCEED 100 MG). May repeat in 2 hours if headache persists or  recurs., Disp: 20 tablet, Rfl: 2 .  verapamil (CALAN-SR) 120 MG CR tablet, Take 1 tablet (120 mg total) by mouth at bedtime., Disp: 90 tablet, Rfl: 1 .  naproxen (NAPROSYN) 500 MG tablet, Take 500 mg by mouth 2 (two) times daily., Disp: , Rfl:     Assessment & Plan:  Foot pain   will start pt on meloxicam, pt doesn't want narcotics and is requesting.  Pt to fu with ortho for this has seen podiatry in the past. Had surgery on this foot and claims he met his podiatrist and they referred him back here for his pain.  Increase dose of neurontin to 300 mg q pm, pt doesn't take his 200 mg bid yet only prn.    Problem List Items Addressed This Visit   None    Orders Placed This Encounter  Procedures  . Ambulatory referral to Orthopedics    Meds ordered this encounter  Medications  . meloxicam (MOBIC) 7.5 MG tablet    Sig: Take 1 tablet (7.5 mg total) by mouth daily.    Dispense:  30 tablet    Refill:  1  . gabapentin (NEURONTIN) 300 MG capsule    Sig: Take 1 capsule (300 mg total) by mouth at bedtime.    Dispense:  30 capsule    Refill:  3     Follow up plan: No follow-ups on file. Marland Kitchen

## 2021-02-27 DIAGNOSIS — G8929 Other chronic pain: Secondary | ICD-10-CM | POA: Insufficient documentation

## 2021-02-27 DIAGNOSIS — M79672 Pain in left foot: Secondary | ICD-10-CM | POA: Insufficient documentation

## 2021-03-02 ENCOUNTER — Other Ambulatory Visit: Payer: Self-pay

## 2021-03-02 ENCOUNTER — Other Ambulatory Visit: Payer: Managed Care, Other (non HMO)

## 2021-03-02 DIAGNOSIS — J438 Other emphysema: Secondary | ICD-10-CM

## 2021-03-02 DIAGNOSIS — R7989 Other specified abnormal findings of blood chemistry: Secondary | ICD-10-CM

## 2021-03-02 DIAGNOSIS — Z13220 Encounter for screening for lipoid disorders: Secondary | ICD-10-CM

## 2021-03-02 DIAGNOSIS — E782 Mixed hyperlipidemia: Secondary | ICD-10-CM

## 2021-03-03 ENCOUNTER — Other Ambulatory Visit: Payer: Self-pay | Admitting: Family Medicine

## 2021-03-03 DIAGNOSIS — E782 Mixed hyperlipidemia: Secondary | ICD-10-CM

## 2021-03-03 LAB — LIPID PANEL
Chol/HDL Ratio: 5.3 ratio — ABNORMAL HIGH (ref 0.0–5.0)
Cholesterol, Total: 158 mg/dL (ref 100–199)
HDL: 30 mg/dL — ABNORMAL LOW (ref >39)
LDL Chol Calc (NIH): 43 mg/dL (ref 0–99)
Triglycerides: 600 mg/dL (ref 0–149)
VLDL Cholesterol Cal: 85 mg/dL — ABNORMAL HIGH (ref 5–40)

## 2021-03-03 LAB — COMPREHENSIVE METABOLIC PANEL
ALT: 17 IU/L (ref 0–44)
AST: 18 IU/L (ref 0–40)
Albumin/Globulin Ratio: 4 — ABNORMAL HIGH (ref 1.2–2.2)
Albumin: 4.8 g/dL (ref 4.0–5.0)
Alkaline Phosphatase: 86 IU/L (ref 44–121)
BUN/Creatinine Ratio: 20 (ref 9–20)
BUN: 17 mg/dL (ref 6–20)
Bilirubin Total: <0.2 mg/dL (ref 0.0–1.2)
CO2: 21 mmol/L (ref 20–29)
Calcium: 9.5 mg/dL (ref 8.7–10.2)
Chloride: 104 mmol/L (ref 96–106)
Creatinine, Ser: 0.87 mg/dL (ref 0.76–1.27)
Globulin, Total: 1.2 g/dL — ABNORMAL LOW (ref 1.5–4.5)
Glucose: 82 mg/dL (ref 70–99)
Potassium: 4.2 mmol/L (ref 3.5–5.2)
Sodium: 141 mmol/L (ref 134–144)
Total Protein: 6 g/dL (ref 6.0–8.5)
eGFR: 115 mL/min/{1.73_m2} (ref >59)

## 2021-03-03 MED ORDER — FENOFIBRATE 145 MG PO TABS: 145.0000 mg | ORAL_TABLET | Freq: Every day | ORAL | 4 refills | Status: DC

## 2021-03-03 NOTE — Progress Notes (Signed)
Pts Triglycerides are very high despite lipitor, LDL wnl now since starting Lipitor, please let him know he will need to start taking fenofibrate   Rx sent in  Thanks

## 2021-03-03 NOTE — Progress Notes (Signed)
Will leave for Dr. Charlotta Newton to determine next steps

## 2021-03-13 ENCOUNTER — Encounter: Payer: Self-pay | Admitting: Internal Medicine

## 2021-03-13 ENCOUNTER — Ambulatory Visit (INDEPENDENT_AMBULATORY_CARE_PROVIDER_SITE_OTHER): Payer: Managed Care, Other (non HMO) | Admitting: Internal Medicine

## 2021-03-13 ENCOUNTER — Other Ambulatory Visit: Payer: Self-pay

## 2021-03-13 VITALS — BP 113/74 | HR 67 | Temp 97.9°F | Ht 68.11 in | Wt 173.4 lb

## 2021-03-13 DIAGNOSIS — G43809 Other migraine, not intractable, without status migrainosus: Secondary | ICD-10-CM

## 2021-03-13 DIAGNOSIS — E785 Hyperlipidemia, unspecified: Secondary | ICD-10-CM | POA: Diagnosis not present

## 2021-03-13 DIAGNOSIS — F319 Bipolar disorder, unspecified: Secondary | ICD-10-CM

## 2021-03-13 DIAGNOSIS — M79673 Pain in unspecified foot: Secondary | ICD-10-CM | POA: Diagnosis not present

## 2021-03-13 MED ORDER — MELOXICAM 15 MG PO TABS: 15.0000 mg | ORAL_TABLET | Freq: Every day | ORAL | 5 refills | Status: DC

## 2021-03-13 MED ORDER — MELOXICAM 7.5 MG PO TABS: 15.0000 mg | ORAL_TABLET | Freq: Every day | ORAL | 4 refills | Status: DC

## 2021-03-13 NOTE — Progress Notes (Signed)
BP 113/74    Pulse 67    Temp 97.9 F (36.6 C) (Oral)    Ht 5' 8.11" (1.73 m)    Wt 173 lb 6.4 oz (78.7 kg)    SpO2 98%    BMI 26.28 kg/m    Subjective:    Patient ID: Joel Carr, male    DOB: 12/26/1985, 35 y.o.   MRN: [id number]  Chief Complaint  Patient presents with   Hyperlipidemia    HPI: Joel Carr is a 35 y.o. male  Hyperlipidemia   Chief Complaint  Patient presents with   Hyperlipidemia    Relevant past medical, surgical, family and social history reviewed and updated as indicated. Interim medical history since our last visit reviewed. Allergies and medications reviewed and updated.  Review of Systems  Per HPI unless specifically indicated above     Objective:    BP 113/74    Pulse 67    Temp 97.9 F (36.6 C) (Oral)    Ht 5' 8.11" (1.73 m)    Wt 173 lb 6.4 oz (78.7 kg)    SpO2 98%    BMI 26.28 kg/m   Wt Readings from Last 3 Encounters:  03/13/21 173 lb 6.4 oz (78.7 kg)  02/17/21 182 lb 12.8 oz (82.9 kg)  01/30/21 173 lb 3.2 oz (78.6 kg)    Physical Exam Vitals and nursing note reviewed.  Constitutional:      General: He is not in acute distress.    Appearance: Normal appearance. He is not ill-appearing or diaphoretic.  HENT:     Nose: No congestion or rhinorrhea.     Mouth/Throat:     Pharynx: No oropharyngeal exudate or posterior oropharyngeal erythema.  Eyes:     Conjunctiva/sclera: Conjunctivae normal.     Pupils: Pupils are equal, round, and reactive to light.  Cardiovascular:     Rate and Rhythm: Normal rate and regular rhythm.     Heart sounds: No murmur heard.   No friction rub. No gallop.  Pulmonary:     Effort: No respiratory distress.     Breath sounds: No stridor. No wheezing or rhonchi.  Chest:     Chest wall: No tenderness.  Abdominal:     General: Abdomen is flat. Bowel sounds are normal.     Palpations: Abdomen is soft. There is no mass.     Tenderness: There is no abdominal tenderness.  Musculoskeletal:     Cervical  back: Normal range of motion and neck supple. No rigidity or tenderness.     Left lower leg: No edema.  Skin:    General: Skin is warm and dry.  Neurological:     Mental Status: He is alert.  Psychiatric:        Mood and Affect: Mood normal.        Behavior: Behavior normal.    Results for orders placed or performed in visit on 03/02/21  Comprehensive metabolic panel  Result Value Ref Range   Glucose 82 70 - 99 mg/dL   BUN 17 6 - 20 mg/dL   Creatinine, Ser 0.87 0.76 - 1.27 mg/dL   eGFR 115 >59 mL/min/1.73   BUN/Creatinine Ratio 20 9 - 20   Sodium 141 134 - 144 mmol/L   Potassium 4.2 3.5 - 5.2 mmol/L   Chloride 104 96 - 106 mmol/L   CO2 21 20 - 29 mmol/L   Calcium 9.5 8.7 - 10.2 mg/dL   Total Protein 6.0 6.0 - 8.5 g/dL  Albumin 4.8 4.0 - 5.0 g/dL   Globulin, Total 1.2 (L) 1.5 - 4.5 g/dL   Albumin/Globulin Ratio 4.0 (H) 1.2 - 2.2   Bilirubin Total <0.2 0.0 - 1.2 mg/dL   Alkaline Phosphatase 86 44 - 121 IU/L   AST 18 0 - 40 IU/L   ALT 17 0 - 44 IU/L  Lipid panel  Result Value Ref Range   Cholesterol, Total 158 100 - 199 mg/dL   Triglycerides 600 (HH) 0 - 149 mg/dL   HDL 30 (L) >39 mg/dL   VLDL Cholesterol Cal 85 (H) 5 - 40 mg/dL   LDL Chol Calc (NIH) 43 0 - 99 mg/dL   Chol/HDL Ratio 5.3 (H) 0.0 - 5.0 ratio        Current Outpatient Medications:    atorvastatin (LIPITOR) 40 MG tablet, Take 1 tablet (40 mg total) by mouth daily., Disp: 30 tablet, Rfl: 3   diazepam (VALIUM) 10 MG tablet, , Disp: , Rfl:    diclofenac Sodium (VOLTAREN) 1 % GEL, Apply topically., Disp: , Rfl:    fenofibrate (TRICOR) 145 MG tablet, Take 1 tablet (145 mg total) by mouth daily., Disp: 30 tablet, Rfl: 4   gabapentin (NEURONTIN) 300 MG capsule, Take 1 capsule (300 mg total) by mouth at bedtime., Disp: 30 capsule, Rfl: 3   lamoTRIgine (LAMICTAL) 100 MG tablet, Take 1 tablet (100 mg total) by mouth 2 (two) times daily., Disp: 180 tablet, Rfl: 0   lurasidone (LATUDA) 80 MG TABS tablet, Take 80 mg  by mouth every evening., Disp: , Rfl:    meloxicam (MOBIC) 7.5 MG tablet, Take 1 tablet (7.5 mg total) by mouth daily., Disp: 30 tablet, Rfl: 1   Multiple Vitamins-Minerals (MULTIVITAMIN WITH MINERALS) tablet, Take 1 tablet by mouth daily., Disp: , Rfl:    QUEtiapine (SEROQUEL) 25 MG tablet, Take 25-50 mg by mouth at bedtime as needed (sleep)., Disp: , Rfl:    SUMAtriptan (IMITREX) 25 MG tablet, Take 1 tablet (25 mg total) by mouth every 2 (two) hours as needed for migraine (NOT TO EXCEED 100 MG). May repeat in 2 hours if headache persists or recurs., Disp: 20 tablet, Rfl: 2   verapamil (CALAN-SR) 120 MG CR tablet, Take 1 tablet (120 mg total) by mouth at bedtime., Disp: 90 tablet, Rfl: 1   clonazePAM (KLONOPIN) 0.5 MG tablet, Take 1 tablet (0.5 mg total) by mouth daily as needed for anxiety., Disp: 30 tablet, Rfl: 0   loratadine (CLARITIN) 10 MG tablet, Take 10 mg by mouth daily as needed for allergies., Disp: , Rfl:     Assessment & Plan:  Foot pain  will start pt on meloxicam, pt doesn't want narcotics and is requesting.  Pt to fu with ortho surgery and a specialist in the area Increase dose of neurontin to 300 mg q pm  Is also on meloxicam   2. Migraines : on verapamil helps per pt. Stable.   3. Htn stable HTN :  Continue current meds.  Medication compliance emphasised. pt advised to keep Bp logs. Pt verbalised understanding of the same. Pt to have a low salt diet . Exercise to reach a goal of at least 150 mins a week.  lifestyle modifications explained and pt understands importance of the above.   4. Htg : is on fenofibrate since bw I had claled this in. Cut back on sugar since this was called in recheck FLP, check LFT's work on diet, SE of meds explained to pt. low fat and high  fiber diet explained to pt.  Problem List Items Addressed This Visit   None    No orders of the defined types were placed in this encounter.    No orders of the defined types were placed in this  encounter.    Follow up plan: No follow-ups on file.

## 2021-05-01 ENCOUNTER — Other Ambulatory Visit: Payer: Self-pay

## 2021-05-01 ENCOUNTER — Encounter: Payer: Self-pay | Admitting: Internal Medicine

## 2021-05-01 NOTE — Telephone Encounter (Signed)
Patient requesting medication sent to [facility]

## 2021-05-04 MED ORDER — VERAPAMIL HCL ER 120 MG PO TBCR: 120.0000 mg | EXTENDED_RELEASE_TABLET | Freq: Every day | ORAL | 1 refills | Status: DC

## 2021-05-11 ENCOUNTER — Ambulatory Visit
Admission: RE | Admit: 2021-05-11 | Discharge: 2021-05-11 | Disposition: A | Discharge status: Home / Self Care | Payer: Managed Care, Other (non HMO) | Source: Ambulatory Visit | Attending: Pain Medicine | Admitting: Pain Medicine

## 2021-05-11 ENCOUNTER — Other Ambulatory Visit: Payer: Self-pay

## 2021-05-11 ENCOUNTER — Ambulatory Visit (HOSPITAL_BASED_OUTPATIENT_CLINIC_OR_DEPARTMENT_OTHER): Payer: Managed Care, Other (non HMO) | Admitting: Pain Medicine

## 2021-05-11 ENCOUNTER — Encounter: Payer: Self-pay | Admitting: Pain Medicine

## 2021-05-11 VITALS — BP 125/93 | HR 84 | Temp 98.1°F | Ht 68.0 in | Wt 168.0 lb

## 2021-05-11 DIAGNOSIS — M79672 Pain in left foot: Secondary | ICD-10-CM

## 2021-05-11 DIAGNOSIS — Z789 Other specified health status: Secondary | ICD-10-CM | POA: Insufficient documentation

## 2021-05-11 DIAGNOSIS — Z79899 Other long term (current) drug therapy: Secondary | ICD-10-CM | POA: Insufficient documentation

## 2021-05-11 DIAGNOSIS — G8929 Other chronic pain: Secondary | ICD-10-CM

## 2021-05-11 DIAGNOSIS — M899 Disorder of bone, unspecified: Secondary | ICD-10-CM

## 2021-05-11 DIAGNOSIS — G894 Chronic pain syndrome: Secondary | ICD-10-CM | POA: Insufficient documentation

## 2021-05-11 MED ORDER — PREDNISONE 20 MG PO TABS: ORAL_TABLET | ORAL | 0 refills | Status: AC

## 2021-05-11 NOTE — Patient Instructions (Signed)
______________________________________________________________________  Preparing for your procedure (without sedation)  Procedure appointments are limited to planned procedures: No Prescription Refills. No disability issues will be discussed. No medication changes will be discussed.  Instructions: Oral Intake: Do not eat or drink anything for at least 6 hours prior to your procedure. (Exception: Blood Pressure Medication. See below.) Transportation: Unless otherwise stated by your physician, you may drive yourself after the procedure. Blood Pressure Medicine: Do not forget to take your blood pressure medicine with a sip of water the morning of the procedure. If your Diastolic (lower reading)is above 100 mmHg, elective cases will be cancelled/rescheduled. Blood thinners: These will need to be stopped for procedures. Notify our staff if you are taking any blood thinners. Depending on which one you take, there will be specific instructions on how and when to stop it. Diabetics on insulin: Notify the staff so that you can be scheduled 1st case in the morning. If your diabetes requires high dose insulin, take only  of your normal insulin dose the morning of the procedure and notify the staff that you have done so. Preventing infections: Shower with an antibacterial soap the morning of your procedure.  Build-up your immune system: Take 1000 mg of Vitamin C with every meal (3 times a day) the day prior to your procedure. Antibiotics: Inform the staff if you have a condition or reason that requires you to take antibiotics before dental procedures. Pregnancy: If you are pregnant, call and cancel the procedure. Sickness: If you have a cold, fever, or any active infections, call and cancel the procedure. Arrival: You must be in the facility at least 30 minutes prior to your scheduled procedure. Children: Do not bring any children with you. Dress appropriately: Bring dark clothing that you would not mind  if they get stained. Valuables: Do not bring any jewelry or valuables.  Reasons to call and reschedule or cancel your procedure: (Following these recommendations will minimize the risk of a serious complication.) Surgeries: Avoid having procedures within 2 weeks of any surgery. (Avoid for 2 weeks before or after any surgery). Flu Shots: Avoid having procedures within 2 weeks of a flu shots or . (Avoid for 2 weeks before or after immunizations). Barium: Avoid having a procedure within 7-10 days after having had a radiological study involving the use of radiological contrast. (Myelograms, Barium swallow or enema study). Heart attacks: Avoid any elective procedures or surgeries for the initial 6 months after a "Myocardial Infarction" (Heart Attack). Blood thinners: It is imperative that you stop these medications before procedures. Let us know if you if you take any blood thinner.  Infection: Avoid procedures during or within two weeks of an infection (including chest colds or gastrointestinal problems). Symptoms associated with infections include: Localized redness, fever, chills, night sweats or profuse sweating, burning sensation when voiding, cough, congestion, stuffiness, runny nose, sore throat, diarrhea, nausea, vomiting, cold or Flu symptoms, recent or current infections. It is specially important if the infection is over the area that we intend to treat. Heart and lung problems: Symptoms that may suggest an active cardiopulmonary problem include: cough, chest pain, breathing difficulties or shortness of breath, dizziness, ankle swelling, uncontrolled high or unusually low blood pressure, and/or palpitations. If you are experiencing any of these symptoms, cancel your procedure and contact your primary care physician for an evaluation.  Remember:  Regular Business hours are:  Monday to Thursday 8:00 AM to 4:00 PM  Provider's Schedule: Zachari Alberta, MD:  Procedure days: Tuesday and Thursday    7:30 AM to 4:00 PM  Bilal Lateef, MD:  Procedure days: Monday and Wednesday 7:30 AM to 4:00 PM ______________________________________________________________________  ____________________________________________________________________________________________  General Risks and Possible Complications  Patient Responsibilities: It is important that you read this as it is part of your informed consent. It is our duty to inform you of the risks and possible complications associated with treatments offered to you. It is your responsibility as a patient to read this and to ask questions about anything that is not clear or that you believe was not covered in this document.  Patient's Rights: You have the right to refuse treatment. You also have the right to change your mind, even after initially having agreed to have the treatment done. However, under this last option, if you wait until the last second to change your mind, you may be charged for the materials used up to that point.  Introduction: Medicine is not an exact science. Everything in Medicine, including the lack of treatment(s), carries the potential for danger, harm, or loss (which is by definition: Risk). In Medicine, a complication is a secondary problem, condition, or disease that can aggravate an already existing one. All treatments carry the risk of possible complications. The fact that a side effects or complications occurs, does not imply that the treatment was conducted incorrectly. It must be clearly understood that these can happen even when everything is done following the highest safety standards.  No treatment: You can choose not to proceed with the proposed treatment alternative. The "PRO(s)" would include: avoiding the risk of complications associated with the therapy. The "CON(s)" would include: not getting any of the treatment benefits. These benefits fall under one of three categories: diagnostic; therapeutic; and/or palliative.  Diagnostic benefits include: getting information which can ultimately lead to improvement of the disease or symptom(s). Therapeutic benefits are those associated with the successful treatment of the disease. Finally, palliative benefits are those related to the decrease of the primary symptoms, without necessarily curing the condition (example: decreasing the pain from a flare-up of a chronic condition, such as incurable terminal cancer).  General Risks and Complications: These are associated to most interventional treatments. They can occur alone, or in combination. They fall under one of the following six (6) categories: no benefit or worsening of symptoms; bleeding; infection; nerve damage; allergic reactions; and/or death. No benefits or worsening of symptoms: In Medicine there are no guarantees, only probabilities. No healthcare provider can ever guarantee that a medical treatment will work, they can only state the probability that it may. Furthermore, there is always the possibility that the condition may worsen, either directly, or indirectly, as a consequence of the treatment. Bleeding: This is more common if the patient is taking a blood thinner, either prescription or over the counter (example: Goody Powders, Fish oil, Aspirin, Garlic, etc.), or if suffering a condition associated with impaired coagulation (example: Hemophilia, cirrhosis of the liver, low platelet counts, etc.). However, even if you do not have one on these, it can still happen. If you have any of these conditions, or take one of these drugs, make sure to notify your treating physician. Infection: This is more common in patients with a compromised immune system, either due to disease (example: diabetes, cancer, human immunodeficiency virus [HIV], etc.), or due to medications or treatments (example: therapies used to treat cancer and rheumatological diseases). However, even if you do not have one on these, it can still happen. If you  have any of these conditions, or take   one of these drugs, make sure to notify your treating physician. Nerve Damage: This is more common when the treatment is an invasive one, but it can also happen with the use of medications, such as those used in the treatment of cancer. The damage can occur to small secondary nerves, or to large primary ones, such as those in the spinal cord and brain. This damage may be temporary or permanent and it may lead to impairments that can range from temporary numbness to permanent paralysis and/or brain death. Allergic Reactions: Any time a substance or material comes in contact with our body, there is the possibility of an allergic reaction. These can range from a mild skin rash (contact dermatitis) to a severe systemic reaction (anaphylactic reaction), which can result in death. Death: In general, any medical intervention can result in death, most of the time due to an unforeseen complication. ____________________________________________________________________________________________  

## 2021-05-11 NOTE — Progress Notes (Signed)
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 cough, or temporary stoppage of breathing during sleep Neurological: No reported neurological signs or symptoms such as seizures, abnormal skin sensations, urinary and/or fecal incontinence, being born with an abnormal open spine and/or a tethered spinal cord Psychological-Psychiatric: Psychiatric disorder, Anxiousness, Depressed, and Difficulty sleeping and or falling asleep Gastrointestinal: No reported gastrointestinal signs or symptoms such as vomiting or evacuating blood, reflux, heartburn, alternating episodes of diarrhea and constipation, inflamed or scarred liver, or pancreas or irrregular and/or infrequent bowel movements Genitourinary: No reported renal or genitourinary signs or symptoms such as difficulty voiding or producing urine, peeing blood, non-functioning  kidney, kidney stones, difficulty emptying the bladder, difficulty controlling the flow of urine, or chronic kidney disease Hematological: No reported hematological signs or symptoms such as prolonged bleeding, low or poor functioning platelets, bruising or bleeding easily, hereditary bleeding problems, low energy levels due to low hemoglobin or being anemic Endocrine: No reported endocrine signs or symptoms such as high or low blood sugar, rapid heart rate due to high thyroid levels, obesity or weight gain due to slow thyroid or thyroid disease Rheumatologic: No reported rheumatological signs and symptoms such as fatigue, joint pain, tenderness, swelling, redness, heat, stiffness, decreased range of motion, with or without associated rash Musculoskeletal: Negative for myasthenia gravis, muscular dystrophy, multiple sclerosis or malignant hyperthermia Work History: Working full time  Allergies  Mr. Riese is allergic to tramadol.  Laboratory Chemistry Profile   Renal Lab Results  Component Value Date   BUN 17 03/02/2021   CREATININE 0.87 03/02/2021   BCR 20 03/02/2021   GFR 113.83 01/22/2020   GFRAA 131 08/08/2019   GFRNONAA >60 08/20/2020   SPECGRAV 1.025 01/23/2021   PHUR 6.0 01/23/2021   PROTEINUR Negative 01/23/2021     Electrolytes Lab Results  Component Value Date   NA 141 03/02/2021   K 4.2 03/02/2021   CL 104 03/02/2021   CALCIUM 9.5 03/02/2021   MG 1.6 (L) 09/02/2016     Hepatic Lab Results  Component Value Date   AST 18 03/02/2021   ALT 17 03/02/2021   ALBUMIN 4.8 03/02/2021   ALKPHOS 86 03/02/2021   LIPASE 26 09/02/2016     ID Lab Results  Component Value Date   HIV Non Reactive 10/20/2018   SARSCOV2NAA Not Detected 04/02/2020     Bone No results found for: VD25OH, GY694WN4OEV, OJ5009FG1, WE9937JI9, 25OHVITD1, 25OHVITD2, 25OHVITD3, TESTOFREE, TESTOSTERONE   Endocrine Lab Results  Component Value Date   GLUCOSE 82 03/02/2021   GLUCOSEU Negative  01/23/2021   HGBA1C 4.9 08/08/2020   TSH 0.679 01/23/2021     Neuropathy Lab Results  Component Value Date   HGBA1C 4.9 08/08/2020   HIV Non Reactive 10/20/2018     CNS No results found for: COLORCSF, APPEARCSF, RBCCOUNTCSF, WBCCSF, POLYSCSF, LYMPHSCSF, EOSCSF, PROTEINCSF, GLUCCSF, JCVIRUS, CSFOLI, IGGCSF, LABACHR, ACETBL, LABACHR, ACETBL   Inflammation (CRP: Acute   ESR: Chronic) Lab Results  Component Value Date   ESRSEDRATE 3 02/20/2020     Rheumatology No results found for: RF, ANA, LABURIC, URICUR, LYMEIGGIGMAB, LYMEABIGMQN, HLAB27   Coagulation Lab Results  Component Value Date   PLT 229 01/23/2021  Cardiovascular Lab Results  Component Value Date   TROPONINI <0.03 10/19/2016   HGB 14.4 01/23/2021   HCT 42.4 01/23/2021     Screening Lab Results  Component Value Date   SARSCOV2NAA Not Detected 04/02/2020   HIV Non Reactive 10/20/2018     Cancer No results found for: CEA, CA125, LABCA2   Allergens No results found for: ALMOND, APPLE, ASPARAGUS, AVOCADO, BANANA, BARLEY, BASIL, BAYLEAF, GREENBEAN, LIMABEAN, WHITEBEAN, BEEFIGE, REDBEET, BLUEBERRY, BROCCOLI, CABBAGE, MELON, CARROT, CASEIN, CASHEWNUT, CAULIFLOWER, CELERY     Note: Lab results reviewed.  PFSH  Drug: Mr. Corvin  reports no history of drug use. Alcohol:  reports that he does not currently use alcohol. Tobacco:  reports that he quit smoking about 2 months ago. His smoking use included cigarettes. He has a 18.00 pack-year smoking history. He has never used smokeless tobacco. Medical:  has a past medical history of Anxiety, Bipolar disorder ([place]), COVID-19 virus infection (12/23/2019), Depression, History of kidney stones, Hyperlipidemia, Pneumonia, and RLS (restless legs syndrome). Family: family history includes Alcohol abuse in his father; Depression in his father; Diabetes in his paternal grandfather; Heart attack in his paternal grandfather.  Past Surgical History:  Procedure Laterality Date    ARTHRODESIS METATARSAL Left 09/19/2020   Procedure: ARTHRODESIS METATARSAL; LISFRANC MULTIPLE-1/2/3;  Surgeon: Caroline More, DPM;  Location: [facility];  Service: Podiatry;  Laterality: Left;   FOOT ARTHRODESIS Left 09/19/2020   Procedure: ARTHRODESIS FOOT- *POSSIBLE BONE GRAFT;  Surgeon: Caroline More, DPM;  Location: [facility];  Service: Podiatry;  Laterality: Left;   KNEE ARTHROSCOPY WITH MENISCAL REPAIR Right 10/29/2014   Procedure: KNEE ARTHROSCOPY WITH MENISCAL REPAIR;  Surgeon: Hessie Knows, MD;  Location: [facility];  Service: Orthopedics;  Laterality: Right;   OPEN REDUCTION INTERNAL FIXATION (ORIF) FOOT LISFRANC FRACTURE Left 09/19/2020   Procedure: OPEN REDUCTION INTERNAL FIXATION (ORIF) FOOT LISFRANC FRACTURE x2 4/5  PERCUTANEOUS SKELETAL FIXATION OF TARSOMETATARSAL JOINT DISLOCATION WITH MANIPULATION, X2 4/5;  Surgeon: Caroline More, DPM;  Location: [facility];  Service: Podiatry;  Laterality: Left;   TESTICLE SURGERY N/A    as a child   TYMPANOPLASTY Bilateral 1990   Active Ambulatory Problems    Diagnosis Date Noted   Bipolar disorder ([place]) 01/01/2015   Mixed hyperlipidemia 04/10/2015   RLS (restless legs syndrome) 02/02/2017   Nodule of skin of abdomen 02/08/2018   GERD (gastroesophageal reflux disease) 07/07/2018   Insomnia 07/07/2018   Anxiety 10/23/2018   Tension headache 05/08/2019   Acute non-recurrent maxillary sinusitis 10/04/2019   DOE (dyspnea on exertion) 01/23/2020   History of COVID-19 01/23/2020   Abnormal CXR 01/23/2020   History of asthma 01/23/2020   Body aches after vaccination 02/12/2020   Former smoker 04/29/2020   Current every day smoker 04/29/2020   Other emphysema ([place]) 04/29/2020   Bipolar 1 disorder ([place]) 12/30/2020   Migraine 12/30/2020   Need for influenza vaccination 01/30/2021   Hyperlipidemia 03/13/2021   Chronic foot pain (Left) 02/27/2021   Chronic pain syndrome 05/11/2021   Pharmacologic therapy 05/11/2021   Disorder of skeletal system  05/11/2021   Problems influencing health status 05/11/2021   Resolved Ambulatory Problems    Diagnosis Date Noted   Biceps tendinitis 10/10/2017   Impingement syndrome of shoulder region 10/10/2017   Flank pain 05/08/2019   Finger pain, left 07/17/2019   Past Medical History:  Diagnosis Date   COVID-19 virus infection 12/23/2019   Depression    History of kidney stones    Pneumonia    Constitutional Exam  General appearance: Well nourished, well developed, and well hydrated. In no apparent acute distress Vitals:   05/11/21 0841  BP: (!) 125/93  Pulse: 84  Temp: 98.1 F (36.7 C)  SpO2: 100%  Weight: 168 lb (76.2 kg)  Height: '5\' 8"'  (1.727 m)   BMI Assessment: Estimated body mass index is 25.54 kg/m as calculated from the following:   Height as of this encounter: '5\' 8"'  (1.727 m).   Weight as of this encounter: 168 lb (76.2 kg).  BMI interpretation table: BMI level Category Range association with higher incidence of chronic pain  <18 kg/m2 Underweight   18.5-24.9 kg/m2 Ideal body weight   25-29.9 kg/m2 Overweight Increased incidence by 20%  30-34.9 kg/m2 Obese (Class I) Increased incidence by 68%  35-39.9 kg/m2 Severe obesity (Class II) Increased incidence by 136%  >40 kg/m2 Extreme obesity (Class III) Increased incidence by 254%   Patient's current BMI Ideal Body weight  Body mass index is 25.54 kg/m. Ideal body weight: 68.4 kg (150 lb 12.7 oz) Adjusted ideal body weight: 71.5 kg (157 lb 10.8 oz)   BMI Readings from Last 4 Encounters:  05/11/21 25.54 kg/m  03/13/21 26.28 kg/m  02/17/21 27.80 kg/m  01/30/21 26.34 kg/m   Wt Readings from Last 4 Encounters:  05/11/21 168 lb (76.2 kg)  03/13/21 173 lb 6.4 oz (78.7 kg)  02/17/21 182 lb 12.8 oz (82.9 kg)  01/30/21 173 lb 3.2 oz (78.6 kg)    Psych/Mental status: Alert, oriented x 3 (person, place, & time)       Eyes: PERLA Respiratory: No evidence of acute respiratory distress  Assessment  Primary Diagnosis  & Pertinent Problem List: The primary encounter diagnosis was Chronic pain syndrome. Diagnoses of Pharmacologic therapy, Disorder of skeletal system, Problems influencing health status, and Chronic foot pain (Left) were also pertinent to this visit.  Visit Diagnosis (New problems to examiner): 1. Chronic pain syndrome   2. Pharmacologic therapy   3. Disorder of skeletal system   4. Problems influencing health status   5. Chronic foot pain (Left)    Plan of Care (Initial workup plan)  Note: Mr. Dollard was reminded that as per protocol, today's visit has been an evaluation only. We have not taken over the patient's controlled substance management.  Problem-specific plan: No problem-specific Assessment & Plan notes found for this encounter.  Lab Orders         Compliance Drug Analysis, Ur         Comp. Metabolic Panel (12)         Magnesium         Vitamin B12         Sedimentation rate         25-Hydroxy vitamin D Lcms D2+D3         C-reactive protein     Imaging Orders         DG Foot Complete Left     Referral Orders  No referral(s) requested today   Procedure Orders    No procedure(s) ordered today   Pharmacotherapy (current): Medications ordered:  Meds ordered this encounter  Medications   predniSONE (DELTASONE) 20 MG tablet    Sig: Take 3 tablets (60 mg total) by mouth daily with breakfast for 3 days, THEN 2 tablets (40 mg total) daily with breakfast for 3 days, THEN 1 tablet (20 mg total) daily with breakfast for 3 days.    Dispense:  18 tablet    Refill:  0   Medications  administered during this visit: Stiven Kaspar. Zynda had no medications administered during this visit.   Pharmacological management options:  Opioid Analgesics: The patient was informed that there is no guarantee that he would be a candidate for opioid analgesics. The decision will be made following CDC guidelines. This decision will be based on the results of diagnostic studies, as well as Mr. Pamer  risk profile.   Membrane stabilizer: To be determined at a later time  Muscle relaxant: To be determined at a later time  NSAID: To be determined at a later time  Other analgesic(s): To be determined at a later time   Interventional management options: Mr. Reaser was informed that there is no guarantee that he would be a candidate for interventional therapies. The decision will be based on the results of diagnostic studies, as well as Mr. Martis risk profile.  Procedure(s) under consideration:  Pending results of ordered studies      Interventional Therapies  Risk   Complexity Considerations:   Estimated body mass index is 25.54 kg/m as calculated from the following:   Height as of this encounter: '5\' 8"'  (1.727 m).   Weight as of this encounter: 168 lb (76.2 kg). WNL   Planned   Pending:   Oral steroid trial X-ray of left foot   Under consideration:   Diagnostic left foot steroid injections   Completed:   None at this time   Therapeutic   Palliative (PRN) options:   None established    Provider-requested follow-up: Return in about 2 weeks (around 05/25/2021) for (38mn), Eval-day (M,W), (F2F), 2nd Visit, for review of ordered tests.  Future Appointments  Date Time Provider [place] 06/12/2021  9:40 AM VCharlynne Cousins MD [facility]  06/24/2021  2:00 PM NMilinda Pointer MD [facility] None    Note by: FGaspar Cola MD Date: 05/11/2021; Time: 9:50 AM

## 2021-05-12 ENCOUNTER — Encounter: Payer: Self-pay | Admitting: Pain Medicine

## 2021-05-13 ENCOUNTER — Encounter: Payer: Self-pay | Admitting: Pain Medicine

## 2021-05-15 LAB — COMPLIANCE DRUG ANALYSIS, UR

## 2021-05-17 LAB — SEDIMENTATION RATE: Sed Rate: 2 mm/hr (ref 0–15)

## 2021-05-17 LAB — COMP. METABOLIC PANEL (12)
AST: 19 IU/L (ref 0–40)
Albumin/Globulin Ratio: 3.3 — ABNORMAL HIGH (ref 1.2–2.2)
Albumin: 5.3 g/dL — ABNORMAL HIGH (ref 4.0–5.0)
Alkaline Phosphatase: 53 IU/L (ref 44–121)
BUN/Creatinine Ratio: 15 (ref 9–20)
BUN: 15 mg/dL (ref 6–20)
Bilirubin Total: 0.3 mg/dL (ref 0.0–1.2)
Calcium: 10.1 mg/dL (ref 8.7–10.2)
Chloride: 103 mmol/L (ref 96–106)
Creatinine, Ser: 0.98 mg/dL (ref 0.76–1.27)
Globulin, Total: 1.6 g/dL (ref 1.5–4.5)
Glucose: 99 mg/dL (ref 70–99)
Potassium: 4.8 mmol/L (ref 3.5–5.2)
Sodium: 143 mmol/L (ref 134–144)
Total Protein: 6.9 g/dL (ref 6.0–8.5)
eGFR: 102 mL/min/{1.73_m2} (ref >59)

## 2021-05-17 LAB — MAGNESIUM: Magnesium: 2 mg/dL (ref 1.6–2.3)

## 2021-05-17 LAB — 25-HYDROXY VITAMIN D LCMS D2+D3
25-Hydroxy, Vitamin D-2: <1 ng/mL
25-Hydroxy, Vitamin D-3: 35 ng/mL
25-Hydroxy, Vitamin D: 35 ng/mL

## 2021-05-17 LAB — C-REACTIVE PROTEIN: CRP: <1 mg/L (ref 0–10)

## 2021-05-17 LAB — VITAMIN B12: Vitamin B-12: 920 pg/mL (ref 232–1245)

## 2021-06-12 ENCOUNTER — Other Ambulatory Visit: Payer: Self-pay

## 2021-06-12 ENCOUNTER — Other Ambulatory Visit: Payer: Managed Care, Other (non HMO)

## 2021-06-12 ENCOUNTER — Ambulatory Visit: Payer: Managed Care, Other (non HMO) | Admitting: Internal Medicine

## 2021-06-12 ENCOUNTER — Other Ambulatory Visit: Payer: Self-pay | Admitting: Internal Medicine

## 2021-06-12 DIAGNOSIS — E782 Mixed hyperlipidemia: Secondary | ICD-10-CM

## 2021-06-12 DIAGNOSIS — E785 Hyperlipidemia, unspecified: Secondary | ICD-10-CM

## 2021-06-12 NOTE — Telephone Encounter (Signed)
Requested Prescriptions  ?Pending Prescriptions Disp Refills  ?? atorvastatin (LIPITOR) 40 MG tablet [Pharmacy Med Name: ATORVASTATIN CALCIUM 40 MG TAB] 30 tablet 0  ?  Sig: TAKE 1 TABLET BY MOUTH DAILY  ?  ? Cardiovascular:  Antilipid - Statins Failed - 06/12/2021 10:12 AM  ?  ?  Failed - Lipid Panel in normal range within the last 12 months  ?  Cholesterol, Total  ?Date Value Ref Range Status  ?03/02/2021 158 100 - 199 mg/dL Final  ? ?LDL Chol Calc (NIH)  ?Date Value Ref Range Status  ?03/02/2021 43 0 - 99 mg/dL Final  ? ?Direct LDL  ?Date Value Ref Range Status  ?01/22/2020 125.0 mg/dL Final  ?  Comment:  ?  Optimal:  <100 mg/dLNear or Above Optimal:  100-129 mg/dLBorderline High:  130-159 mg/dLHigh:  160-189 mg/dLVery High:  >190 mg/dL  ? ?HDL  ?Date Value Ref Range Status  ?03/02/2021 30 (L) >39 mg/dL Final  ? ?Triglycerides  ?Date Value Ref Range Status  ?03/02/2021 600 (HH) 0 - 149 mg/dL Final  ? ?  ?  ?  Passed - Patient is not pregnant  ?  ?  Passed - Valid encounter within last 12 months  ?  Recent Outpatient Visits   ?      ? 3 months ago Hyperlipidemia, unspecified hyperlipidemia type  ? [facility] Vigg, Avanti, MD  ? 3 months ago Left foot pain  ? [facility] Vigg, Avanti, MD  ? 4 months ago Need for influenza vaccination  ? [facility] Vigg, Avanti, MD  ? 5 months ago Bipolar 1 disorder (HCC)  ? [facility] Vigg, Avanti, MD  ? 10 months ago Abnormal CBC  ? [facility] Vigg, Avanti, MD  ?  ?  ?Future Appointments   ?        ? In 6 days Vigg, Avanti, MD [facility]  ?  ? ?  ?  ?  ? ?

## 2021-06-13 LAB — CBC WITH DIFFERENTIAL/PLATELET
Basophils Absolute: 0 10*3/uL (ref 0.0–0.2)
Basos: 0 %
EOS (ABSOLUTE): 0.1 10*3/uL (ref 0.0–0.4)
Eos: 1 %
Hematocrit: 42.1 % (ref 37.5–51.0)
Hemoglobin: 14.2 g/dL (ref 13.0–17.7)
Immature Grans (Abs): 0 10*3/uL (ref 0.0–0.1)
Immature Granulocytes: 0 %
Lymphocytes Absolute: 1.8 10*3/uL (ref 0.7–3.1)
Lymphs: 38 %
MCH: 29.9 pg (ref 26.6–33.0)
MCHC: 33.7 g/dL (ref 31.5–35.7)
MCV: 89 fL (ref 79–97)
Monocytes Absolute: 0.4 10*3/uL (ref 0.1–0.9)
Monocytes: 8 %
Neutrophils Absolute: 2.4 10*3/uL (ref 1.4–7.0)
Neutrophils: 53 %
Platelets: 245 10*3/uL (ref 150–450)
RBC: 4.75 x10E6/uL (ref 4.14–5.80)
RDW: 13.3 % (ref 11.6–15.4)
WBC: 4.7 10*3/uL (ref 3.4–10.8)

## 2021-06-13 LAB — COMPREHENSIVE METABOLIC PANEL
ALT: 13 IU/L (ref 0–44)
AST: 22 IU/L (ref 0–40)
Albumin/Globulin Ratio: 3.3 — ABNORMAL HIGH (ref 1.2–2.2)
Albumin: 5.2 g/dL — ABNORMAL HIGH (ref 4.0–5.0)
Alkaline Phosphatase: 54 IU/L (ref 44–121)
BUN/Creatinine Ratio: 12 (ref 9–20)
BUN: 11 mg/dL (ref 6–20)
Bilirubin Total: 0.5 mg/dL (ref 0.0–1.2)
CO2: 24 mmol/L (ref 20–29)
Calcium: 10.1 mg/dL (ref 8.7–10.2)
Chloride: 102 mmol/L (ref 96–106)
Creatinine, Ser: 0.93 mg/dL (ref 0.76–1.27)
Globulin, Total: 1.6 g/dL (ref 1.5–4.5)
Glucose: 94 mg/dL (ref 70–99)
Potassium: 4.8 mmol/L (ref 3.5–5.2)
Sodium: 140 mmol/L (ref 134–144)
Total Protein: 6.8 g/dL (ref 6.0–8.5)
eGFR: 109 mL/min/{1.73_m2} (ref >59)

## 2021-06-13 LAB — LIPID PANEL W/O CHOL/HDL RATIO
Cholesterol, Total: 204 mg/dL — ABNORMAL HIGH (ref 100–199)
HDL: 63 mg/dL (ref >39)
LDL Chol Calc (NIH): 130 mg/dL — ABNORMAL HIGH (ref 0–99)
Triglycerides: 62 mg/dL (ref 0–149)
VLDL Cholesterol Cal: 11 mg/dL (ref 5–40)

## 2021-06-18 ENCOUNTER — Ambulatory Visit: Payer: Managed Care, Other (non HMO) | Admitting: Internal Medicine

## 2021-06-18 ENCOUNTER — Encounter: Payer: Self-pay | Admitting: Internal Medicine

## 2021-06-18 ENCOUNTER — Other Ambulatory Visit: Payer: Self-pay

## 2021-06-18 VITALS — BP 129/85 | HR 103 | Temp 98.1°F | Ht 62.99 in | Wt 155.8 lb

## 2021-06-18 DIAGNOSIS — E782 Mixed hyperlipidemia: Secondary | ICD-10-CM | POA: Diagnosis not present

## 2021-06-18 DIAGNOSIS — F3132 Bipolar disorder, current episode depressed, moderate: Secondary | ICD-10-CM | POA: Diagnosis not present

## 2021-06-18 NOTE — Progress Notes (Signed)
? ?BP 129/85   Pulse (!) 103   Temp 98.1 ?F (36.7 ?C) (Oral)   Ht 5' 2.99" (1.6 m)   Wt 155 lb 12.8 oz (70.7 kg)   SpO2 99%   BMI 27.61 kg/m?   ? ?Subjective:  ? ? Patient ID: Joel Carr, male    DOB: 05/13/1985, 35 y.o.   MRN: [id number] ? ?Chief Complaint  ?Patient presents with  ? Hyperlipidemia  ? ? ?HPI: ?Joel Carr is a 35 y.o. male ? ?Hyperlipidemia ?Pertinent negatives include no chest pain, myalgias or shortness of breath.  ?Anxiety ?Patient reports no chest pain, confusion, decreased concentration, dizziness, nausea, palpitations, shortness of breath or suicidal ideas.  ? ? ?Depression ?       This is a chronic (no recent episodes x 6months sees pscyh 2 monthly they are in [place]) problem.  Associated symptoms include no decreased concentration, no fatigue, no appetite change, no myalgias, no headaches and no suicidal ideas.  Past medical history includes anxiety.   ? ?Chief Complaint  ?Patient presents with  ? Hyperlipidemia  ? ? ?Relevant past medical, surgical, family and social history reviewed and updated as indicated. Interim medical history since our last visit reviewed. ?Allergies and medications reviewed and updated. ? ?Review of Systems  ?Constitutional:  Negative for activity change, appetite change, chills, fatigue and fever.  ?HENT:  Negative for congestion, ear discharge, ear pain and facial swelling.   ?Eyes:  Negative for pain, discharge and itching.  ?Respiratory:  Negative for cough, chest tightness, shortness of breath and wheezing.   ?Cardiovascular:  Negative for chest pain, palpitations and leg swelling.  ?Gastrointestinal:  Negative for abdominal distention, abdominal pain, blood in stool, constipation, diarrhea, nausea and vomiting.  ?Endocrine: Negative for cold intolerance, heat intolerance, polydipsia, polyphagia and polyuria.  ?Genitourinary:  Negative for difficulty urinating, dysuria, flank pain, frequency, hematuria and urgency.  ?Musculoskeletal:  Negative for  arthralgias, gait problem, joint swelling and myalgias.  ?Skin:  Negative for color change, rash and wound.  ?Neurological:  Negative for dizziness, tremors, speech difficulty, weakness, light-headedness, numbness and headaches.  ?Hematological:  Does not bruise/bleed easily.  ?Psychiatric/Behavioral:  Positive for depression. Negative for agitation, confusion, decreased concentration, sleep disturbance and suicidal ideas.   ? ?Per HPI unless specifically indicated above ? ?   ?Objective:  ?  ?BP 129/85   Pulse (!) 103   Temp 98.1 ?F (36.7 ?C) (Oral)   Ht 5' 2.99" (1.6 m)   Wt 155 lb 12.8 oz (70.7 kg)   SpO2 99%   BMI 27.61 kg/m?   ?Wt Readings from Last 3 Encounters:  ?06/18/21 155 lb 12.8 oz (70.7 kg)  ?05/11/21 168 lb (76.2 kg)  ?03/13/21 173 lb 6.4 oz (78.7 kg)  ?  ?Physical Exam ?Vitals and nursing note reviewed.  ?Constitutional:   ?   General: He is not in acute distress. ?   Appearance: Normal appearance. He is not ill-appearing or diaphoretic.  ?HENT:  ?   Head: Normocephalic and atraumatic.  ?   Right Ear: Tympanic membrane and external ear normal. There is no impacted cerumen.  ?   Left Ear: External ear normal.  ?   Nose: No congestion or rhinorrhea.  ?   Mouth/Throat:  ?   Pharynx: No oropharyngeal exudate or posterior oropharyngeal erythema.  ?Eyes:  ?   Conjunctiva/sclera: Conjunctivae normal.  ?   Pupils: Pupils are equal, round, and reactive to light.  ?Cardiovascular:  ?   Rate and   Rhythm: Normal rate and regular rhythm.  ?   Heart sounds: No murmur heard. ?  No friction rub. No gallop.  ?Pulmonary:  ?   Effort: No respiratory distress.  ?   Breath sounds: No stridor. No wheezing or rhonchi.  ?Chest:  ?   Chest wall: No tenderness.  ?Abdominal:  ?   General: Abdomen is flat. Bowel sounds are normal.  ?   Palpations: Abdomen is soft. There is no mass.  ?   Tenderness: There is no abdominal tenderness.  ?Musculoskeletal:     ?   General: No swelling, tenderness, deformity or signs of injury.  ?    Cervical back: Normal range of motion and neck supple. No rigidity or tenderness.  ?   Right lower leg: No edema.  ?   Left lower leg: No edema.  ?Skin: ?   General: Skin is warm and dry.  ?   Coloration: Skin is not jaundiced or pale.  ?   Findings: No bruising or lesion.  ?Neurological:  ?   Mental Status: He is alert.  ? ? ?Results for orders placed or performed in visit on 06/12/21  ?Comprehensive metabolic panel  ?Result Value Ref Range  ? Glucose 94 70 - 99 mg/dL  ? BUN 11 6 - 20 mg/dL  ? Creatinine, Ser 0.93 0.76 - 1.27 mg/dL  ? eGFR 109 >59 mL/min/1.73  ? BUN/Creatinine Ratio 12 9 - 20  ? Sodium 140 134 - 144 mmol/L  ? Potassium 4.8 3.5 - 5.2 mmol/L  ? Chloride 102 96 - 106 mmol/L  ? CO2 24 20 - 29 mmol/L  ? Calcium 10.1 8.7 - 10.2 mg/dL  ? Total Protein 6.8 6.0 - 8.5 g/dL  ? Albumin 5.2 (H) 4.0 - 5.0 g/dL  ? Globulin, Total 1.6 1.5 - 4.5 g/dL  ? Albumin/Globulin Ratio 3.3 (H) 1.2 - 2.2  ? Bilirubin Total 0.5 0.0 - 1.2 mg/dL  ? Alkaline Phosphatase 54 44 - 121 IU/L  ? AST 22 0 - 40 IU/L  ? ALT 13 0 - 44 IU/L  ?Lipid Panel w/o Chol/HDL Ratio  ?Result Value Ref Range  ? Cholesterol, Total 204 (H) 100 - 199 mg/dL  ? Triglycerides 62 0 - 149 mg/dL  ? HDL 63 >39 mg/dL  ? VLDL Cholesterol Cal 11 5 - 40 mg/dL  ? LDL Chol Calc (NIH) 130 (H) 0 - 99 mg/dL  ?CBC with Differential/Platelet  ?Result Value Ref Range  ? WBC 4.7 3.4 - 10.8 x10E3/uL  ? RBC 4.75 4.14 - 5.80 x10E6/uL  ? Hemoglobin 14.2 13.0 - 17.7 g/dL  ? Hematocrit 42.1 37.5 - 51.0 %  ? MCV 89 79 - 97 fL  ? MCH 29.9 26.6 - 33.0 pg  ? MCHC 33.7 31.5 - 35.7 g/dL  ? RDW 13.3 11.6 - 15.4 %  ? Platelets 245 150 - 450 x10E3/uL  ? Neutrophils 53 Not Estab. %  ? Lymphs 38 Not Estab. %  ? Monocytes 8 Not Estab. %  ? Eos 1 Not Estab. %  ? Basos 0 Not Estab. %  ? Neutrophils Absolute 2.4 1.4 - 7.0 x10E3/uL  ? Lymphocytes Absolute 1.8 0.7 - 3.1 x10E3/uL  ? Monocytes Absolute 0.4 0.1 - 0.9 x10E3/uL  ? EOS (ABSOLUTE) 0.1 0.0 - 0.4 x10E3/uL  ? Basophils Absolute 0.0 0.0 -  0.2 x10E3/uL  ? Immature Granulocytes 0 Not Estab. %  ? Immature Grans (Abs) 0.0 0.0 - 0.1 x10E3/uL  ? ?   ? ? ?  Current Outpatient Medications:  ?  acetaminophen (TYLENOL) 325 MG tablet, Take 650 mg by mouth every 6 (six) hours as needed., Disp: , Rfl:  ?  atorvastatin (LIPITOR) 40 MG tablet, TAKE 1 TABLET BY MOUTH DAILY, Disp: 30 tablet, Rfl: 0 ?  diazepam (VALIUM) 10 MG tablet, 1-2 QD PRN, Disp: , Rfl:  ?  diclofenac Sodium (VOLTAREN) 1 % GEL, Apply topically., Disp: , Rfl:  ?  fenofibrate (TRICOR) 145 MG tablet, Take 1 tablet (145 mg total) by mouth daily., Disp: 30 tablet, Rfl: 4 ?  gabapentin (NEURONTIN) 300 MG capsule, Take 1 capsule (300 mg total) by mouth at bedtime., Disp: 30 capsule, Rfl: 3 ?  lamoTRIgine (LAMICTAL) 100 MG tablet, Take 1 tablet (100 mg total) by mouth 2 (two) times daily., Disp: 180 tablet, Rfl: 0 ?  lurasidone (LATUDA) 80 MG TABS tablet, Take 80 mg by mouth every evening., Disp: , Rfl:  ?  Multiple Vitamins-Minerals (MULTIVITAMIN WITH MINERALS) tablet, Take 1 tablet by mouth daily., Disp: , Rfl:  ?  QUEtiapine (SEROQUEL) 25 MG tablet, Take 25-50 mg by mouth at bedtime as needed (sleep)., Disp: , Rfl:  ?  SUMAtriptan (IMITREX) 25 MG tablet, Take 1 tablet (25 mg total) by mouth every 2 (two) hours as needed for migraine (NOT TO EXCEED 100 MG). May repeat in 2 hours if headache persists or recurs., Disp: 20 tablet, Rfl: 2 ?  verapamil (CALAN-SR) 120 MG CR tablet, Take 1 tablet (120 mg total) by mouth at bedtime., Disp: 90 tablet, Rfl: 1  ? ? ?Assessment & Plan:  ?BPD : sees psych for such ?is on valium,  latuda, seroquel at night, lamictal for the above.  ?Mood better , has been dealing with this for a while.BPD stable. Sees psych for such  ?No recent exacerbations. ?BPD stable. Sees psych for such  ?No recent exacerbations. ? ? ?2.HLD is on fenofibrate and lipitor for such  ?recheck FLP, check LFT's work on diet, SE of meds explained to pt. low fat and high fiber diet explained to pt. ? ?  Latest Reference Range & Units 01/23/21 09:55 03/02/21 08:22 06/12/21 09:22  ?Total CHOL/HDL Ratio 0.0 - 5.0 ratio 5.9 (H) 5.3 (H)   ?Cholesterol, Total 100 - 199 mg/dL 260 (H) 158 204 (H)  ?HDL Cholesterol >39

## 2021-06-21 NOTE — Progress Notes (Signed)
PROVIDER NOTE: Information contained herein reflects review and annotations entered in association with encounter. Interpretation of such information and data should be left to medically-trained personnel. Information provided to patient can be located elsewhere in the medical record under "Patient Instructions". Document created using STT-dictation technology, any transcriptional errors that may result from process are unintentional.  ?  ?Patient: Joel Carr  Service Category: E/M  Provider: Gaspar Cola, MD  ?DOB: 05-02-1985  DOS: 06/24/2021  Specialty: Interventional Pain Management  ?MRN: [id number]  Setting: Ambulatory outpatient  PCP: Charlynne Cousins, MD  ?Type: Established Patient    Referring Provider: Charlynne Cousins, MD  ?Location: Office  Delivery: Face-to-face    ? ?Primary Reason(s) for Visit: Encounter for evaluation before starting new chronic pain management plan of care (Level of risk: moderate) ?CC: Foot Pain (Left s/p surgery ) ? ?HPI  ?Joel Carr is a 36 y.o. year old, male patient, who comes today for a follow-up evaluation to review the test results and decide on a treatment plan. He has Bipolar disorder ([place]); Mixed hyperlipidemia; RLS (restless legs syndrome); Nodule of skin of abdomen; GERD (gastroesophageal reflux disease); Insomnia; Anxiety; Tension headache; Acute non-recurrent maxillary sinusitis; DOE (dyspnea on exertion); History of COVID-19; Abnormal CXR; History of asthma; Body aches after vaccination; Former smoker; Current every day smoker; Other emphysema ([place]); Bipolar 1 disorder ([place]); Migraine; Need for influenza vaccination; Hyperlipidemia; Chronic foot pain (Left); Chronic pain syndrome; Pharmacologic therapy; Disorder of skeletal system; and Problems influencing health status on their problem list. His primarily concern today is the Foot Pain (Left s/p surgery ) ? ?Pain Assessment: ?Location: Left Foot ?Radiating: denies ?Onset: More than a month ago ?Duration: Chronic  pain ?Quality: Discomfort, Constant, Sharp, Shooting ?Severity: 5 /10 (subjective, self-reported pain score)  ?Effect on ADL: difficult to work.  wears steel toe shoes ?Timing: Constant ?Modifying factors: nothing currently ?BP: 121/82  HR: 79 ? ?Joel Carr comes in today for a follow-up visit after his initial evaluation on 05/11/2021. Today we went over the results of his tests. These were explained in "Layman's terms". During today's appointment we went over my diagnostic impression, as well as the proposed treatment plan. ? ?According to the patient, around 09/19/2020 he broke his left foot.  Subsequently he had to undergo reconstructive surgery.  Since then he has continued to experience pain in the lateral medial aspect of the foot.  He has 3 well-healed surgical scar, 1 in the lateral aspect of the foot, another in the medial aspect, and the other 1 in the dorsal aspect of his foot.  None of them show any redness, or swelling.  His last x-ray was there 1 showing the surgical pins.  Today I will be ordering new x-rays to show the current status of his left foot.  He indicates his pain to be primarily on weightbearing.  He describes this pain to be occasionally sharp, electrical-like, denies any burning pain.  He has completed 6 weeks of physical therapy but he still has some difficulty ambulating.  He indicates having tried some tramadol which did help the pain, but it makes him very nervous and gives him tremors.  This is likely to be from the norepinephrine receptor activity of the medication.  He has been using the diclofenac gel and he was also using meloxicam, but because he did not see any significant benefit with the meloxicam he has stopped it.  He also continues to take gabapentin 300 mg at bedtime.  He denies having had  any nerve blocks or injections into the area.  He also denies having had any oral steroids. ? ?Today we went over the results of his tests.  No acute findings.  He still having a little  bit of pain in the lateral aspect of his foot.  He indicates that he has been slowly getting better.  Since he is improving, I do not see anything else that we need to do at this time.  I have recommended that he try to avoid using shoes that are too tight.  Other than that, I have also recommended that if he continues to have some burning sensation in the area, he can probably use some over-the-counter capsaicin cream.  He was reminded to wash his hands thoroughly after using and not to take it to his face or mucosal membranes.  He understood and accepted. ? ?Controlled Substance Pharmacotherapy Assessment ?REMS (Risk Evaluation and Mitigation Strategy)  ?Opioid Analgesic: None ?MME/day: 0 mg/day  ?Pill Count: None expected due to no prior prescriptions written by our practice. ?Janett Billow, RN  06/24/2021  2:54 PM  Signed ?Safety precautions to be maintained throughout the outpatient stay will include: orient to surroundings, keep bed in low position, maintain call bell within reach at all times, provide assistance with transfer out of bed and ambulation.  ?  ?Pharmacokinetics: ?Liberation and absorption (onset of action): WNL ?Distribution (time to peak effect): WNL ?Metabolism and excretion (duration of action): WNL         ?Pharmacodynamics: ?Desired effects: ?Analgesia: Joel Carr reports >50% benefit. ?Functional ability: Patient reports that medication allows him to accomplish basic ADLs ?Clinically meaningful improvement in function (CMIF): Sustained CMIF goals met ?Perceived effectiveness: Described as relatively effective, allowing for increase in activities of daily living (ADL) ?Undesirable effects: ?Side-effects or Adverse reactions: None reported ?Monitoring: ?[place] PMP: PDMP reviewed during this encounter. Online review of the past 46-monthperiod previously conducted. Not applicable at this point since we have not taken over the patient's medication management yet. ?List of other Serum/Urine  Drug Screening Test(s):  ?Lab Results  ?Component Value Date  ? COCAINSCRNUR NONE DETECTED 08/20/2020  ? THCU NONE DETECTED 08/20/2020  ? ETH <10 08/20/2020  ? ?List of all UDS test(s) done:  ?Lab Results  ?Component Value Date  ? SUMMARY Note 05/11/2021  ? ?Last UDS on record: ?Summary  ?Date Value Ref Range Status  ?05/11/2021 Note  Final  ?  Comment:  ?  ==================================================================== ?Compliance Drug Analysis, Ur ?==================================================================== ?Test                             Result       Flag       Units ? ?Drug Present and Declared for Prescription Verification ?  Desmethyldiazepam              225          EXPECTED   ng/mg creat ?  Oxazepam                       1367         EXPECTED   ng/mg creat ?  Temazepam                      762          EXPECTED   ng/mg creat ?   Desmethyldiazepam, oxazepam, and temazepam are expected metabolites ?  of diazepam. Desmethyldiazepam and oxazepam are also expected ?   metabolites of other drugs, including chlordiazepoxide, prazepam, ?   clorazepate, and halazepam. Oxazepam is an expected metabolite of ?   temazepam. Oxazepam and temazepam are also available as scheduled ?   prescription medications. ? ?  Gabapentin                     PRESENT      EXPECTED ?  Lamotrigine                    PRESENT      EXPECTED ?  Lurasidone                     PRESENT      EXPECTED ?  Acetaminophen                  PRESENT      EXPECTED ?  Verapamil                      PRESENT      EXPECTED ? ?Drug Present not Declared for Prescription Verification ?  Carboxy-THC                    113          UNEXPECTED ng/mg creat ?   Carboxy-THC is a metabolite of tetrahydrocannabinol (THC). Source of ?   THC is most commonly herbal marijuana or marijuana-based products, ?   but THC is also present in a scheduled prescription medication. ?   Trace amounts of THC can be present in hemp and cannabidiol (CBD) ?   products.  This test is not intended to distinguish between delta-9- ?   tetrahydrocannabinol, the predominant form of THC in most herbal or ?   marijuana-based products, and delta-8-tetrahydrocannabinol. ? ?Drug Absent bu

## 2021-06-24 ENCOUNTER — Ambulatory Visit: Payer: Managed Care, Other (non HMO) | Attending: Pain Medicine | Admitting: Pain Medicine

## 2021-06-24 ENCOUNTER — Encounter: Payer: Self-pay | Admitting: Pain Medicine

## 2021-06-24 VITALS — BP 121/82 | HR 79 | Temp 97.1°F | Resp 16 | Ht 68.0 in | Wt 155.0 lb

## 2021-06-24 DIAGNOSIS — M79672 Pain in left foot: Secondary | ICD-10-CM | POA: Insufficient documentation

## 2021-06-24 DIAGNOSIS — G8929 Other chronic pain: Secondary | ICD-10-CM | POA: Diagnosis not present

## 2021-06-24 NOTE — Progress Notes (Signed)
Safety precautions to be maintained throughout the outpatient stay will include: orient to surroundings, keep bed in low position, maintain call bell within reach at all times, provide assistance with transfer out of bed and ambulation.  

## 2021-07-16 ENCOUNTER — Encounter: Payer: Self-pay | Admitting: Internal Medicine

## 2021-07-16 ENCOUNTER — Ambulatory Visit: Payer: Managed Care, Other (non HMO) | Admitting: Internal Medicine

## 2021-07-16 VITALS — BP 132/84 | HR 85 | Temp 97.8°F | Ht 66.93 in | Wt 146.8 lb

## 2021-07-16 DIAGNOSIS — G8929 Other chronic pain: Secondary | ICD-10-CM | POA: Diagnosis not present

## 2021-07-16 DIAGNOSIS — M79672 Pain in left foot: Secondary | ICD-10-CM

## 2021-07-16 DIAGNOSIS — F3132 Bipolar disorder, current episode depressed, moderate: Secondary | ICD-10-CM | POA: Diagnosis not present

## 2021-07-16 DIAGNOSIS — E785 Hyperlipidemia, unspecified: Secondary | ICD-10-CM

## 2021-07-16 MED ORDER — FENOFIBRATE 54 MG PO TABS: 54.0000 mg | ORAL_TABLET | Freq: Every day | ORAL | 2 refills | Status: DC

## 2021-07-16 NOTE — Progress Notes (Signed)
? ?BP 132/84   Pulse 85   Temp 97.8 ?F (36.6 ?C) (Oral)   Ht 5' 6.93" (1.7 m)   Wt 146 lb 12.8 oz (66.6 kg)   SpO2 98%   BMI 23.04 kg/m?   ? ?Subjective:  ? ? Patient ID: Joel Carr, male    DOB: [birth year], 36 y.o.   MRN: [id number] ? ?Chief Complaint  ?Patient presents with  ? Hyperlipidemia  ? Bi Polar  ? ? ?HPI: ?SARP VERNIER is a 36 y.o. male ? ?Hyperlipidemia ?This is a chronic problem. The problem is controlled. Recent lipid tests were reviewed and are normal.  ?Ankle Pain  ?The incident occurred at home. The pain is mild. The pain has been Improving since onset. Pertinent negatives include no inability to bear weight, loss of motion, loss of sensation, muscle weakness, numbness or tingling.  ? ?Chief Complaint  ?Patient presents with  ? Hyperlipidemia  ? Bi Polar  ? ? ?Relevant past medical, surgical, family and social history reviewed and updated as indicated. Interim medical history since our last visit reviewed. ?Allergies and medications reviewed and updated. ? ?Review of Systems  ?Neurological:  Negative for tingling and numbness.  ? ?Per HPI unless specifically indicated above ? ?   ?Objective:  ?  ?BP 132/84   Pulse 85   Temp 97.8 ?F (36.6 ?C) (Oral)   Ht 5' 6.93" (1.7 m)   Wt 146 lb 12.8 oz (66.6 kg)   SpO2 98%   BMI 23.04 kg/m?   ?Wt Readings from Last 3 Encounters:  ?07/16/21 146 lb 12.8 oz (66.6 kg)  ?06/24/21 155 lb (70.3 kg)  ?06/18/21 155 lb 12.8 oz (70.7 kg)  ?  ?Physical Exam ?Vitals and nursing note reviewed.  ?Constitutional:   ?   General: He is not in acute distress. ?   Appearance: Normal appearance. He is not ill-appearing or diaphoretic.  ?HENT:  ?   Head: Normocephalic and atraumatic.  ?   Right Ear: Tympanic membrane and external ear normal. There is no impacted cerumen.  ?   Left Ear: External ear normal.  ?   Nose: No congestion or rhinorrhea.  ?   Mouth/Throat:  ?   Pharynx: No oropharyngeal exudate or posterior oropharyngeal erythema.  ?Eyes:  ?    Conjunctiva/sclera: Conjunctivae normal.  ?   Pupils: Pupils are equal, round, and reactive to light.  ?Cardiovascular:  ?   Rate and Rhythm: Normal rate and regular rhythm.  ?   Heart sounds: No murmur heard. ?  No friction rub. No gallop.  ?Pulmonary:  ?   Effort: No respiratory distress.  ?   Breath sounds: No stridor. No wheezing or rhonchi.  ?Chest:  ?   Chest wall: No tenderness.  ?Abdominal:  ?   General: Abdomen is flat. Bowel sounds are normal.  ?   Palpations: Abdomen is soft. There is no mass.  ?   Tenderness: There is no abdominal tenderness.  ?Musculoskeletal:  ?   Cervical back: Normal range of motion and neck supple. No rigidity or tenderness.  ?   Left lower leg: No edema.  ?Skin: ?   General: Skin is warm and dry.  ?Neurological:  ?   Mental Status: He is alert.  ? ? ?Results for orders placed or performed in visit on 06/12/21  ?Comprehensive metabolic panel  ?Result Value Ref Range  ? Glucose 94 70 - 99 mg/dL  ? BUN 11 6 - 20 mg/dL  ? Creatinine,  Ser 0.93 0.76 - 1.27 mg/dL  ? eGFR 109 >59 mL/min/1.73  ? BUN/Creatinine Ratio 12 9 - 20  ? Sodium 140 134 - 144 mmol/L  ? Potassium 4.8 3.5 - 5.2 mmol/L  ? Chloride 102 96 - 106 mmol/L  ? CO2 24 20 - 29 mmol/L  ? Calcium 10.1 8.7 - 10.2 mg/dL  ? Total Protein 6.8 6.0 - 8.5 g/dL  ? Albumin 5.2 (H) 4.0 - 5.0 g/dL  ? Globulin, Total 1.6 1.5 - 4.5 g/dL  ? Albumin/Globulin Ratio 3.3 (H) 1.2 - 2.2  ? Bilirubin Total 0.5 0.0 - 1.2 mg/dL  ? Alkaline Phosphatase 54 44 - 121 IU/L  ? AST 22 0 - 40 IU/L  ? ALT 13 0 - 44 IU/L  ?Lipid Panel w/o Chol/HDL Ratio  ?Result Value Ref Range  ? Cholesterol, Total 204 (H) 100 - 199 mg/dL  ? Triglycerides 62 0 - 149 mg/dL  ? HDL 63 >39 mg/dL  ? VLDL Cholesterol Cal 11 5 - 40 mg/dL  ? LDL Chol Calc (NIH) 130 (H) 0 - 99 mg/dL  ?CBC with Differential/Platelet  ?Result Value Ref Range  ? WBC 4.7 3.4 - 10.8 x10E3/uL  ? RBC 4.75 4.14 - 5.80 x10E6/uL  ? Hemoglobin 14.2 13.0 - 17.7 g/dL  ? Hematocrit 42.1 37.5 - 51.0 %  ? MCV 89 79 - 97  fL  ? MCH 29.9 26.6 - 33.0 pg  ? MCHC 33.7 31.5 - 35.7 g/dL  ? RDW 13.3 11.6 - 15.4 %  ? Platelets 245 150 - 450 x10E3/uL  ? Neutrophils 53 Not Estab. %  ? Lymphs 38 Not Estab. %  ? Monocytes 8 Not Estab. %  ? Eos 1 Not Estab. %  ? Basos 0 Not Estab. %  ? Neutrophils Absolute 2.4 1.4 - 7.0 x10E3/uL  ? Lymphocytes Absolute 1.8 0.7 - 3.1 x10E3/uL  ? Monocytes Absolute 0.4 0.1 - 0.9 x10E3/uL  ? EOS (ABSOLUTE) 0.1 0.0 - 0.4 x10E3/uL  ? Basophils Absolute 0.0 0.0 - 0.2 x10E3/uL  ? Immature Granulocytes 0 Not Estab. %  ? Immature Grans (Abs) 0.0 0.0 - 0.1 x10E3/uL  ? ?   ? ? ?Current Outpatient Medications:  ?  acetaminophen (TYLENOL) 325 MG tablet, Take 650 mg by mouth every 6 (six) hours as needed., Disp: , Rfl:  ?  atorvastatin (LIPITOR) 40 MG tablet, TAKE 1 TABLET BY MOUTH DAILY, Disp: 30 tablet, Rfl: 0 ?  diazepam (VALIUM) 10 MG tablet, 1-2 QD PRN, Disp: , Rfl:  ?  fenofibrate (TRICOR) 145 MG tablet, Take 1 tablet (145 mg total) by mouth daily., Disp: 30 tablet, Rfl: 4 ?  gabapentin (NEURONTIN) 300 MG capsule, Take 1 capsule (300 mg total) by mouth at bedtime., Disp: 30 capsule, Rfl: 3 ?  lamoTRIgine (LAMICTAL) 100 MG tablet, Take 1 tablet (100 mg total) by mouth 2 (two) times daily., Disp: 180 tablet, Rfl: 0 ?  lurasidone (LATUDA) 80 MG TABS tablet, Take 80 mg by mouth every evening., Disp: , Rfl:  ?  Multiple Vitamins-Minerals (MULTIVITAMIN WITH MINERALS) tablet, Take 1 tablet by mouth daily., Disp: , Rfl:  ?  QUEtiapine (SEROQUEL) 25 MG tablet, Take 25-50 mg by mouth at bedtime as needed (sleep)., Disp: , Rfl:  ?  SUMAtriptan (IMITREX) 25 MG tablet, Take 1 tablet (25 mg total) by mouth every 2 (two) hours as needed for migraine (NOT TO EXCEED 100 MG). May repeat in 2 hours if headache persists or recurs., Disp: 20 tablet, Rfl: 2 ?  verapamil (CALAN-SR) 120 MG CR tablet, Take 1 tablet (120 mg total) by mouth at bedtime., Disp: 90 tablet, Rfl: 1  ? ? ?Assessment & Plan:  ?BPD : sees psych for such ?is on valium,   latuda, seroquel at night, lamictal for the above.  ?Mood better , has been dealing with this for a while.BPD stable. Sees psych for such  ?No recent exacerbations. ?BPD stable. Sees psych for such  ?No recent exacerbations. ?  ?  ?2.HLD is on fenofibrate and lipitor for such  ?recheck FLP, check LFT's work on diet, SE of meds explained to pt. low fat and high fiber diet explained to pt. ? ?3. Left ankle pain  ?Is seeing pain mx for such, now released.  ? ? ? ?Problem List Items Addressed This Visit   ?None ?  ? ?No orders of the defined types were placed in this encounter. ?  ? ?No orders of the defined types were placed in this encounter. ?  ? ?Follow up plan: ?No follow-ups on file. ? ?

## 2021-08-10 ENCOUNTER — Encounter: Payer: Self-pay | Admitting: Internal Medicine

## 2021-08-11 ENCOUNTER — Encounter: Payer: Self-pay | Admitting: Internal Medicine

## 2021-08-11 ENCOUNTER — Telehealth (INDEPENDENT_AMBULATORY_CARE_PROVIDER_SITE_OTHER): Payer: Managed Care, Other (non HMO) | Admitting: Internal Medicine

## 2021-08-11 DIAGNOSIS — G43809 Other migraine, not intractable, without status migrainosus: Secondary | ICD-10-CM

## 2021-08-11 MED ORDER — VERAPAMIL HCL ER 180 MG PO TBCR: 180.0000 mg | EXTENDED_RELEASE_TABLET | Freq: Every day | ORAL | 2 refills | Status: DC

## 2021-08-11 NOTE — Progress Notes (Signed)
1. ? ?There were no vitals taken for this visit.  ? ?Subjective:  ? ? Patient ID: Joel Carr, male    DOB: Jul 14, 1985, 36 y.o.   MRN: [id number] ? ?No chief complaint on file. ? ? ?HPI: ?Joel Carr is a 36 y.o. male ? ? ?This visit was completed via telephone due to the restrictions of the COVID-19 pandemic. All issues as above were discussed and addressed but no physical exam was performed. If it was felt that the patient should be evaluated in the office, they were directed there. The patient verbally consented to this visit. Patient was unable to complete an audio/visual visit due to Technical difficulties. Due to the catastrophic nature of the COVID-19 pandemic, this visit was done through audio contact only. ?Location of the patient: home ?Location of the provider: work ?Those involved with this call:  ?Provider: Charlynne Cousins, MD ?CMA: Frazier Butt, CMA ?Front Desk/Registration: [employer]  ?Time spent on call: 10 minutes on the phone discussing health concerns. 10 minutes total spent in review of patient's record and preparation of their chart. ? ? ? ?Migraine  ?This is a recurrent (had to take 3 doses of imitrex and couldnt get rid of the headache. feels better took about 3 doses and had to take day off of work) problem. Episode onset: never needs such a high dose of imitrex in the past. Associated symptoms include dizziness. Pertinent negatives include no abdominal pain, abnormal behavior, anorexia, back pain, blurred vision, coughing, drainage, ear pain, eye pain, eye redness, eye watering, facial sweating, fever, hearing loss, insomnia, loss of balance, muscle aches, nausea, neck pain, numbness, phonophobia, photophobia, rhinorrhea, scalp tenderness, seizures, sinus pressure, sore throat, swollen glands, tingling, tinnitus, visual change, vomiting, weakness or weight loss.  ? ?No chief complaint on file. ? ? ?Relevant past medical, surgical, family and social history reviewed and updated as  indicated. Interim medical history since our last visit reviewed. ?Allergies and medications reviewed and updated. ? ?Review of Systems  ?Constitutional:  Negative for fever and weight loss.  ?HENT:  Negative for ear pain, hearing loss, rhinorrhea, sinus pressure, sore throat and tinnitus.   ?Eyes:  Negative for blurred vision, photophobia, pain and redness.  ?Respiratory:  Negative for cough.   ?Gastrointestinal:  Negative for abdominal pain, anorexia, nausea and vomiting.  ?Musculoskeletal:  Negative for back pain and neck pain.  ?Neurological:  Positive for dizziness. Negative for tingling, seizures, weakness, numbness and loss of balance.  ?Psychiatric/Behavioral:  The patient does not have insomnia.   ? ?Per HPI unless specifically indicated above ? ?   ?Objective:  ?  ?There were no vitals taken for this visit.  ?Wt Readings from Last 3 Encounters:  ?08/12/21 149 lb 3.2 oz (67.7 kg)  ?07/16/21 146 lb 12.8 oz (66.6 kg)  ?06/24/21 155 lb (70.3 kg)  ?  ?Physical Exam ? ?Results for orders placed or performed in visit on 06/12/21  ?Comprehensive metabolic panel  ?Result Value Ref Range  ? Glucose 94 70 - 99 mg/dL  ? BUN 11 6 - 20 mg/dL  ? Creatinine, Ser 0.93 0.76 - 1.27 mg/dL  ? eGFR 109 >59 mL/min/1.73  ? BUN/Creatinine Ratio 12 9 - 20  ? Sodium 140 134 - 144 mmol/L  ? Potassium 4.8 3.5 - 5.2 mmol/L  ? Chloride 102 96 - 106 mmol/L  ? CO2 24 20 - 29 mmol/L  ? Calcium 10.1 8.7 - 10.2 mg/dL  ? Total Protein 6.8 6.0 - 8.5 g/dL  ?  Albumin 5.2 (H) 4.0 - 5.0 g/dL  ? Globulin, Total 1.6 1.5 - 4.5 g/dL  ? Albumin/Globulin Ratio 3.3 (H) 1.2 - 2.2  ? Bilirubin Total 0.5 0.0 - 1.2 mg/dL  ? Alkaline Phosphatase 54 44 - 121 IU/L  ? AST 22 0 - 40 IU/L  ? ALT 13 0 - 44 IU/L  ?Lipid Panel w/o Chol/HDL Ratio  ?Result Value Ref Range  ? Cholesterol, Total 204 (H) 100 - 199 mg/dL  ? Triglycerides 62 0 - 149 mg/dL  ? HDL 63 >39 mg/dL  ? VLDL Cholesterol Cal 11 5 - 40 mg/dL  ? LDL Chol Calc (NIH) 130 (H) 0 - 99 mg/dL  ?CBC with  Differential/Platelet  ?Result Value Ref Range  ? WBC 4.7 3.4 - 10.8 x10E3/uL  ? RBC 4.75 4.14 - 5.80 x10E6/uL  ? Hemoglobin 14.2 13.0 - 17.7 g/dL  ? Hematocrit 42.1 37.5 - 51.0 %  ? MCV 89 79 - 97 fL  ? MCH 29.9 26.6 - 33.0 pg  ? MCHC 33.7 31.5 - 35.7 g/dL  ? RDW 13.3 11.6 - 15.4 %  ? Platelets 245 150 - 450 x10E3/uL  ? Neutrophils 53 Not Estab. %  ? Lymphs 38 Not Estab. %  ? Monocytes 8 Not Estab. %  ? Eos 1 Not Estab. %  ? Basos 0 Not Estab. %  ? Neutrophils Absolute 2.4 1.4 - 7.0 x10E3/uL  ? Lymphocytes Absolute 1.8 0.7 - 3.1 x10E3/uL  ? Monocytes Absolute 0.4 0.1 - 0.9 x10E3/uL  ? EOS (ABSOLUTE) 0.1 0.0 - 0.4 x10E3/uL  ? Basophils Absolute 0.0 0.0 - 0.2 x10E3/uL  ? Immature Granulocytes 0 Not Estab. %  ? Immature Grans (Abs) 0.0 0.0 - 0.1 x10E3/uL  ? ?   ? ? ?Current Outpatient Medications:  ?  acetaminophen (TYLENOL) 325 MG tablet, Take 650 mg by mouth every 6 (six) hours as needed., Disp: , Rfl:  ?  atorvastatin (LIPITOR) 40 MG tablet, TAKE 1 TABLET BY MOUTH DAILY, Disp: 30 tablet, Rfl: 0 ?  diazepam (VALIUM) 10 MG tablet, 1-2 QD PRN, Disp: , Rfl:  ?  fenofibrate 54 MG tablet, Take 1 tablet (54 mg total) by mouth daily., Disp: 90 tablet, Rfl: 2 ?  gabapentin (NEURONTIN) 300 MG capsule, Take 1 capsule (300 mg total) by mouth at bedtime., Disp: 30 capsule, Rfl: 3 ?  lamoTRIgine (LAMICTAL) 100 MG tablet, Take 1 tablet (100 mg total) by mouth 2 (two) times daily., Disp: 180 tablet, Rfl: 0 ?  Multiple Vitamins-Minerals (MULTIVITAMIN WITH MINERALS) tablet, Take 1 tablet by mouth daily., Disp: , Rfl:  ?  QUEtiapine (SEROQUEL) 25 MG tablet, Take 25-50 mg by mouth at bedtime as needed (sleep)., Disp: , Rfl:  ?  SUMAtriptan (IMITREX) 50 MG tablet, Take 0.5 tablets (25 mg total) by mouth every 2 (two) hours as needed for migraine (not to exceed 100 mg in 24 hrs.)., Disp: 20 tablet, Rfl: 2 ?  verapamil (CALAN-SR) 180 MG CR tablet, Take 1 tablet (180 mg total) by mouth at bedtime., Disp: 30 tablet, Rfl: 2  ? ? ?Assessment  & Plan:  ?Migraines ?Will need to continue imitrex ? Worse headache sec to higher dose of imitrex and needing to take it more frequently ?Has had dizziness as well which is unusial ?Isnt near a pharmacy to check vitals will need to be seen in offcie tommorow or go to UC if symptoms worsne.  ? ?Problem List Items Addressed This Visit   ? ?  ? Cardiovascular  and Mediastinum  ? Migraine - Primary  ? Relevant Medications  ? verapamil (CALAN-SR) 180 MG CR tablet  ?  ? ?No orders of the defined types were placed in this encounter. ?  ? ?Meds ordered this encounter  ?Medications  ? verapamil (CALAN-SR) 180 MG CR tablet  ?  Sig: Take 1 tablet (180 mg total) by mouth at bedtime.  ?  Dispense:  30 tablet  ?  Refill:  2  ?  ? ?Follow up plan: ?Return in about 1 day (around 08/12/2021). ? ? ? ?

## 2021-08-11 NOTE — Telephone Encounter (Signed)
We can talk to him ealrier we have no shows!

## 2021-08-11 NOTE — Telephone Encounter (Signed)
Can we WI this pt can be virtual as well thnx. Pl ask him if he wants this or can see him in the office as well.

## 2021-08-12 ENCOUNTER — Encounter: Payer: Self-pay | Admitting: Internal Medicine

## 2021-08-12 ENCOUNTER — Ambulatory Visit: Payer: Managed Care, Other (non HMO) | Admitting: Internal Medicine

## 2021-08-12 VITALS — BP 113/76 | HR 64 | Temp 97.8°F | Ht 66.93 in | Wt 149.2 lb

## 2021-08-12 DIAGNOSIS — G8929 Other chronic pain: Secondary | ICD-10-CM

## 2021-08-12 DIAGNOSIS — R519 Headache, unspecified: Secondary | ICD-10-CM | POA: Diagnosis not present

## 2021-08-12 MED ORDER — SUMATRIPTAN SUCCINATE 50 MG PO TABS: 25.0000 mg | ORAL_TABLET | ORAL | 2 refills | Status: DC | PRN

## 2021-08-12 NOTE — Progress Notes (Signed)
? ?BP 113/76   Pulse 64   Temp 97.8 ?F (36.6 ?C) (Oral)   Ht 5' 6.93" (1.7 m)   Wt 149 lb 3.2 oz (67.7 kg)   SpO2 98%   BMI 23.42 kg/m?   ? ?Subjective:  ? ? Patient ID: Joel Carr, male    DOB: 03/24/1986, 36 y.o.   MRN: [id number] ? ?Chief Complaint  ?Patient presents with  ? Headache  ?  Patient here to check on BP for possible cause of headaches  ? ? ?HPI: ?Joel Carr is a 36 y.o. male ? ?Headache  ?This is a recurrent (had a severe headahce with dizziness new to last few episodes) problem. Episode onset: had to take 3 doses of imitrex. The problem has been rapidly improving. The pain is located in the Bilateral, retro-orbital and frontal (back of his head was the worse.) region. Pertinent negatives include no abdominal pain, abnormal behavior, anorexia, blurred vision, coughing, dizziness, drainage, ear pain, eye pain, eye redness, facial sweating, fever, hearing loss, numbness, phonophobia, photophobia, scalp tenderness, seizures, sinus pressure, sore throat, swollen glands, tingling, tinnitus, visual change, vomiting or weakness.  ? ?Chief Complaint  ?Patient presents with  ? Headache  ?  Patient here to check on BP for possible cause of headaches  ? ? ?Relevant past medical, surgical, family and social history reviewed and updated as indicated. Interim medical history since our last visit reviewed. ?Allergies and medications reviewed and updated. ? ?Review of Systems  ?Constitutional:  Negative for fever.  ?HENT:  Negative for ear pain, hearing loss, sinus pressure, sore throat and tinnitus.   ?Eyes:  Negative for blurred vision, photophobia, pain and redness.  ?Respiratory:  Negative for cough.   ?Gastrointestinal:  Negative for abdominal pain, anorexia and vomiting.  ?Neurological:  Positive for headaches. Negative for dizziness, tingling, seizures, weakness and numbness.  ? ?Per HPI unless specifically indicated above ? ?   ?Objective:  ?  ?BP 113/76   Pulse 64   Temp 97.8 ?F (36.6 ?C) (Oral)    Ht 5' 6.93" (1.7 m)   Wt 149 lb 3.2 oz (67.7 kg)   SpO2 98%   BMI 23.42 kg/m?   ?Wt Readings from Last 3 Encounters:  ?08/12/21 149 lb 3.2 oz (67.7 kg)  ?07/16/21 146 lb 12.8 oz (66.6 kg)  ?06/24/21 155 lb (70.3 kg)  ?  ?Physical Exam ?Vitals and nursing note reviewed.  ?Constitutional:   ?   General: He is not in acute distress. ?   Appearance: Normal appearance. He is not ill-appearing or diaphoretic.  ?HENT:  ?   Head: Normocephalic and atraumatic.  ?   Right Ear: Tympanic membrane and external ear normal. There is no impacted cerumen.  ?   Left Ear: External ear normal.  ?   Nose: No congestion or rhinorrhea.  ?   Mouth/Throat:  ?   Pharynx: No oropharyngeal exudate or posterior oropharyngeal erythema.  ?Eyes:  ?   Conjunctiva/sclera: Conjunctivae normal.  ?   Pupils: Pupils are equal, round, and reactive to light.  ?Cardiovascular:  ?   Rate and Rhythm: Normal rate and regular rhythm.  ?   Heart sounds: No murmur heard. ?  No friction rub. No gallop.  ?Pulmonary:  ?   Effort: No respiratory distress.  ?   Breath sounds: No stridor. No wheezing or rhonchi.  ?Chest:  ?   Chest wall: No tenderness.  ?Abdominal:  ?   General: Abdomen is flat. Bowel sounds are  normal.  ?   Palpations: Abdomen is soft. There is no mass.  ?   Tenderness: There is no abdominal tenderness.  ?Musculoskeletal:  ?   Cervical back: Normal range of motion and neck supple. No rigidity or tenderness.  ?   Left lower leg: No edema.  ?Skin: ?   General: Skin is warm.  ?Neurological:  ?   Mental Status: He is alert.  ?Psychiatric:     ?   Mood and Affect: Mood is not anxious.     ?   Behavior: Behavior is not agitated.  ? ?Results for orders placed or performed in visit on 06/12/21  ?Comprehensive metabolic panel  ?Result Value Ref Range  ? Glucose 94 70 - 99 mg/dL  ? BUN 11 6 - 20 mg/dL  ? Creatinine, Ser 0.93 0.76 - 1.27 mg/dL  ? eGFR 109 >59 mL/min/1.73  ? BUN/Creatinine Ratio 12 9 - 20  ? Sodium 140 134 - 144 mmol/L  ? Potassium 4.8 3.5  - 5.2 mmol/L  ? Chloride 102 96 - 106 mmol/L  ? CO2 24 20 - 29 mmol/L  ? Calcium 10.1 8.7 - 10.2 mg/dL  ? Total Protein 6.8 6.0 - 8.5 g/dL  ? Albumin 5.2 (H) 4.0 - 5.0 g/dL  ? Globulin, Total 1.6 1.5 - 4.5 g/dL  ? Albumin/Globulin Ratio 3.3 (H) 1.2 - 2.2  ? Bilirubin Total 0.5 0.0 - 1.2 mg/dL  ? Alkaline Phosphatase 54 44 - 121 IU/L  ? AST 22 0 - 40 IU/L  ? ALT 13 0 - 44 IU/L  ?Lipid Panel w/o Chol/HDL Ratio  ?Result Value Ref Range  ? Cholesterol, Total 204 (H) 100 - 199 mg/dL  ? Triglycerides 62 0 - 149 mg/dL  ? HDL 63 >39 mg/dL  ? VLDL Cholesterol Cal 11 5 - 40 mg/dL  ? LDL Chol Calc (NIH) 130 (H) 0 - 99 mg/dL  ?CBC with Differential/Platelet  ?Result Value Ref Range  ? WBC 4.7 3.4 - 10.8 x10E3/uL  ? RBC 4.75 4.14 - 5.80 x10E6/uL  ? Hemoglobin 14.2 13.0 - 17.7 g/dL  ? Hematocrit 42.1 37.5 - 51.0 %  ? MCV 89 79 - 97 fL  ? MCH 29.9 26.6 - 33.0 pg  ? MCHC 33.7 31.5 - 35.7 g/dL  ? RDW 13.3 11.6 - 15.4 %  ? Platelets 245 150 - 450 x10E3/uL  ? Neutrophils 53 Not Estab. %  ? Lymphs 38 Not Estab. %  ? Monocytes 8 Not Estab. %  ? Eos 1 Not Estab. %  ? Basos 0 Not Estab. %  ? Neutrophils Absolute 2.4 1.4 - 7.0 x10E3/uL  ? Lymphocytes Absolute 1.8 0.7 - 3.1 x10E3/uL  ? Monocytes Absolute 0.4 0.1 - 0.9 x10E3/uL  ? EOS (ABSOLUTE) 0.1 0.0 - 0.4 x10E3/uL  ? Basophils Absolute 0.0 0.0 - 0.2 x10E3/uL  ? Immature Granulocytes 0 Not Estab. %  ? Immature Grans (Abs) 0.0 0.0 - 0.1 x10E3/uL  ? ?   ? ? ?Current Outpatient Medications:  ?  acetaminophen (TYLENOL) 325 MG tablet, Take 650 mg by mouth every 6 (six) hours as needed., Disp: , Rfl:  ?  atorvastatin (LIPITOR) 40 MG tablet, TAKE 1 TABLET BY MOUTH DAILY, Disp: 30 tablet, Rfl: 0 ?  diazepam (VALIUM) 10 MG tablet, 1-2 QD PRN, Disp: , Rfl:  ?  fenofibrate 54 MG tablet, Take 1 tablet (54 mg total) by mouth daily., Disp: 90 tablet, Rfl: 2 ?  gabapentin (NEURONTIN) 300 MG capsule, Take 1  capsule (300 mg total) by mouth at bedtime., Disp: 30 capsule, Rfl: 3 ?  lamoTRIgine (LAMICTAL)  100 MG tablet, Take 1 tablet (100 mg total) by mouth 2 (two) times daily., Disp: 180 tablet, Rfl: 0 ?  Multiple Vitamins-Minerals (MULTIVITAMIN WITH MINERALS) tablet, Take 1 tablet by mouth daily., Disp: , Rfl:  ?  QUEtiapine (SEROQUEL) 25 MG tablet, Take 25-50 mg by mouth at bedtime as needed (sleep)., Disp: , Rfl:  ?  verapamil (CALAN-SR) 180 MG CR tablet, Take 1 tablet (180 mg total) by mouth at bedtime., Disp: 30 tablet, Rfl: 2 ?  SUMAtriptan (IMITREX) 50 MG tablet, Take 0.5 tablets (25 mg total) by mouth every 2 (two) hours as needed for migraine (not to exceed 100 mg in 24 hrs.)., Disp: 20 tablet, Rfl: 2  ? ? ?Assessment & Plan:  ?Headaches ? Sec to migraines ?Wil increase imitrex to 50 mg consider nurtec or newer injectables which he isnt interested in at the moment to pursue. ?Increase verapamil to 180 mg for prophylaxis.  ?  ?Problem List Items Addressed This Visit   ? ?  ? Other  ? Chronic nonintractable headache - Primary  ? Relevant Medications  ? SUMAtriptan (IMITREX) 50 MG tablet  ?  ? ?No orders of the defined types were placed in this encounter. ?  ? ?Meds ordered this encounter  ?Medications  ? SUMAtriptan (IMITREX) 50 MG tablet  ?  Sig: Take 0.5 tablets (25 mg total) by mouth every 2 (two) hours as needed for migraine (not to exceed 100 mg in 24 hrs.).  ?  Dispense:  20 tablet  ?  Refill:  2  ?  ? ?Follow up plan: ?No follow-ups on file. ? ? ?

## 2021-08-30 ENCOUNTER — Encounter: Payer: Self-pay | Admitting: Internal Medicine

## 2021-09-04 ENCOUNTER — Encounter: Payer: Managed Care, Other (non HMO) | Admitting: Internal Medicine

## 2021-11-06 ENCOUNTER — Encounter: Payer: BC Managed Care – PPO | Admitting: Nurse Practitioner

## 2021-11-16 ENCOUNTER — Encounter: Payer: BC Managed Care – PPO | Admitting: Nurse Practitioner

## 2021-11-16 ENCOUNTER — Encounter: Payer: BC Managed Care – PPO | Admitting: Physician Assistant

## 2021-12-11 ENCOUNTER — Encounter: Payer: BC Managed Care – PPO | Admitting: Nurse Practitioner

## 2021-12-15 ENCOUNTER — Encounter: Payer: BC Managed Care – PPO | Admitting: Nurse Practitioner

## 2021-12-15 NOTE — Progress Notes (Deleted)
There were no vitals taken for this visit.   Subjective:    Patient ID: Joel Carr, male    DOB: 1985/04/02, 36 y.o.   MRN: [id number]  HPI: Joel Carr is a 36 y.o. male presenting on 12/15/2021 for comprehensive medical examination. Current medical complaints include:{Blank single:19197::"none","***"}  He currently lives with: Interim Problems from his last visit: {Blank single:19197::"yes","no"}  HYPERLIPIDEMIA Hyperlipidemia status: {Blank single:19197::"excellent compliance","good compliance","fair compliance","poor compliance"} Satisfied with current treatment?  {Blank single:19197::"yes","no"} Side effects:  {Blank single:19197::"yes","no"} Medication compliance: {Blank single:19197::"excellent compliance","good compliance","fair compliance","poor compliance"} Past cholesterol meds: {Blank multiple:19196::"none","atorvastain (lipitor)","lovastatin (mevacor)","pravastatin (pravachol)","rosuvastatin (crestor)","simvastatin (zocor)","vytorin","fenofibrate (tricor)","gemfibrozil","ezetimide (zetia)","niaspan","lovaza"} Supplements: {Blank multiple:19196::"none","fish oil","niacin","red yeast rice"} Aspirin:  {Blank single:19197::"yes","no"} The ASCVD Risk score (Arnett DK, et al., 2019) failed to calculate for the following reasons:   The 2019 ASCVD risk score is only valid for ages 42 to 72 Chest pain:  {Blank single:19197::"yes","no"} Coronary artery disease:  {Blank single:19197::"yes","no"} Family history CAD:  {Blank single:19197::"yes","no"} Family history early CAD:  {Blank single:19197::"yes","no"}  COPD COPD status: {Blank single:19197::"controlled","uncontrolled","better","worse","exacerbated","stable"} Satisfied with current treatment?: {Blank single:19197::"yes","no"} Oxygen use: {Blank single:19197::"yes","no"} Dyspnea frequency:  Cough frequency:  Rescue inhaler frequency:   Limitation of activity: {Blank single:19197::"yes","no"} Productive cough:  Last  Spirometry:  Pneumovax: {Blank single:19197::"Up to Date","Not up to Date","unknown"} Influenza: {Blank single:19197::"Up to Date","Not up to Date","unknown"}  MOOD   Depression Screen done today and results listed below:     08/12/2021    9:42 AM 07/16/2021    4:09 PM 06/18/2021    1:20 PM 03/13/2021    9:45 AM 02/17/2021    9:41 AM  Depression screen PHQ 2/9  Decreased Interest 1 0 0 1 1  Down, Depressed, Hopeless 1 0 0 0 1  PHQ - 2 Score 2 0 0 1 2  Altered sleeping 1 0 0 0 0  Tired, decreased energy _0 Change in appetite 0 0 0 1 1  Feeling bad or failure about yourself  0 0 0 0 0  Trouble concentrating 0 0 0 0 1  Moving slowly or fidgety/restless 1 0 0 0 0  Suicidal thoughts 0 0 0 0 0  PHQ-9 Score _1 Difficult doing work/chores Somewhat difficult Not difficult at all Not difficult at all Not difficult at all Somewhat difficult    The patient {has/does not have:19849} a history of falls. I {did/did not:19850} complete a risk assessment for falls. A plan of care for falls {was/was not:19852} documented.   Past Medical History:  Past Medical History:  Diagnosis Date   Anxiety    Bipolar disorder ([place])    COVID-19 virus infection 12/23/2019   mild congestion and body aches   Depression    History of kidney stones    Hyperlipidemia    Pneumonia    RLS (restless legs syndrome)     Surgical History:  Past Surgical History:  Procedure Laterality Date   ARTHRODESIS METATARSAL Left 09/19/2020   Procedure: ARTHRODESIS METATARSAL; LISFRANC MULTIPLE-1/2/3;  Surgeon: Caroline More, DPM;  Location: [facility];  Service: Podiatry;  Laterality: Left;   FOOT ARTHRODESIS Left 09/19/2020   Procedure: ARTHRODESIS FOOT- *POSSIBLE BONE GRAFT;  Surgeon: Caroline More, DPM;  Location: [facility];  Service: Podiatry;  Laterality: Left;   KNEE ARTHROSCOPY WITH MENISCAL REPAIR Right 10/29/2014   Procedure: KNEE ARTHROSCOPY WITH MENISCAL REPAIR;  Surgeon: Hessie Knows, MD;   Location: [facility];  Service: Orthopedics;  Laterality: Right;   OPEN REDUCTION INTERNAL FIXATION (  ORIF) FOOT LISFRANC FRACTURE Left 09/19/2020   Procedure: OPEN REDUCTION INTERNAL FIXATION (ORIF) FOOT LISFRANC FRACTURE x2 4/5  PERCUTANEOUS SKELETAL FIXATION OF TARSOMETATARSAL JOINT DISLOCATION WITH MANIPULATION, X2 4/5;  Surgeon: Caroline More, DPM;  Location: [facility];  Service: Podiatry;  Laterality: Left;   TESTICLE SURGERY N/A    as a child   TYMPANOPLASTY Bilateral 1990    Medications:  Current Outpatient Medications on File Prior to Visit  Medication Sig   acetaminophen (TYLENOL) 325 MG tablet Take 650 mg by mouth every 6 (six) hours as needed.   atorvastatin (LIPITOR) 40 MG tablet TAKE 1 TABLET BY MOUTH DAILY   diazepam (VALIUM) 10 MG tablet 1-2 QD PRN   fenofibrate 54 MG tablet Take 1 tablet (54 mg total) by mouth daily.   gabapentin (NEURONTIN) 300 MG capsule Take 1 capsule (300 mg total) by mouth at bedtime.   lamoTRIgine (LAMICTAL) 100 MG tablet Take 1 tablet (100 mg total) by mouth 2 (two) times daily.   Multiple Vitamins-Minerals (MULTIVITAMIN WITH MINERALS) tablet Take 1 tablet by mouth daily.   QUEtiapine (SEROQUEL) 25 MG tablet Take 25-50 mg by mouth at bedtime as needed (sleep).   SUMAtriptan (IMITREX) 50 MG tablet Take 0.5 tablets (25 mg total) by mouth every 2 (two) hours as needed for migraine (not to exceed 100 mg in 24 hrs.).   verapamil (CALAN-SR) 180 MG CR tablet Take 1 tablet (180 mg total) by mouth at bedtime.   No current facility-administered medications on file prior to visit.    Allergies:  Allergies  Allergen Reactions   Tramadol Other (See Comments)    Jittery    Social History:  Social History   Socioeconomic History   Marital status: Married    Spouse name: Louretta Parma   Number of children: 2   Years of education: 10-   Highest education level: GED or equivalent  Occupational History   Not on file  Tobacco Use   Smoking status: Former     Packs/day: 1.00    Years: 18.00    Total pack years: 18.00    Types: Cigarettes    Quit date: 02/2021    Years since quitting: 0.8   Smokeless tobacco: Never   Tobacco comments:    3 cig daily-04/29/2020  Vaping Use   Vaping Use: Every day   Substances: Nicotine, Flavoring  Substance and Sexual Activity   Alcohol use: Not Currently    Comment: rarely   Drug use: No   Sexual activity: Yes  Other Topics Concern   Not on file  Social History Narrative   Married with 2 kids. 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, chartered Strain: Unknown (01/31/2017)   Overall Financial Resource Strain (CARDIA)    Difficulty of Paying Living Expenses: Patient refused  Food Insecurity: Unknown (01/31/2017)   Hunger Vital Sign    Worried About Running Out of Food in the Last Year: Patient refused    [place] in the Last Year: Patient refused  Transportation Needs: Unknown (01/31/2017)   [place] - Hydrologist (Medical): Patient refused    Lack of Transportation (Non-Medical): Patient refused  Physical Activity: Unknown (01/31/2017)   Exercise Vital Sign    Days of Exercise per Week: Patient refused    Minutes of Exercise per Session: Patient refused  Stress: Unknown (01/31/2017)   [place]  Stress Questionnaire    Feeling of Stress : Patient refused  Social Connections: Unknown (01/31/2017)   Social Connection and Isolation Panel [NHANES]    Frequency of Communication with Friends and Family: Patient refused    Frequency of Social Gatherings with Friends and Family: Patient refused    Attends Religious Services: Patient refused    Active Member of Clubs or Organizations: Patient refused    Attends Archivist Meetings: Patient refused    Marital Status: Married  Human resources officer Violence: Unknown (01/31/2017)   Humiliation, Afraid,  Rape, and Kick questionnaire    Fear of Current or Ex-Partner: Patient refused    Emotionally Abused: Patient refused    Physically Abused: Patient refused    Sexually Abused: Patient refused   Social History   Tobacco Use  Smoking Status Former   Packs/day: 1.00   Years: 18.00   Total pack years: 18.00   Types: Cigarettes   Quit date: 02/2021   Years since quitting: 0.8  Smokeless Tobacco Never  Tobacco Comments   3 cig daily-04/29/2020   Social History   Substance and Sexual Activity  Alcohol Use Not Currently   Comment: rarely    Family History:  Family History  Problem Relation Age of Onset   Depression Father    Alcohol abuse Father    Diabetes Paternal Grandfather    Heart attack Paternal Grandfather    Colon cancer Neg Hx     Past medical history, surgical history, medications, allergies, family history and social history reviewed with patient today and changes made to appropriate areas of the chart.   ROS All other ROS negative except what is listed above and in the HPI.      Objective:    There were no vitals taken for this visit.  Wt Readings from Last 3 Encounters:  08/12/21 149 lb 3.2 oz (67.7 kg)  07/16/21 146 lb 12.8 oz (66.6 kg)  06/24/21 155 lb (70.3 kg)    Physical Exam  Results for orders placed or performed in visit on 06/12/21  Comprehensive metabolic panel  Result Value Ref Range   Glucose 94 70 - 99 mg/dL   BUN 11 6 - 20 mg/dL   Creatinine, Ser 0.93 0.76 - 1.27 mg/dL   eGFR 109 >59 mL/min/1.73   BUN/Creatinine Ratio 12 9 - 20   Sodium 140 134 - 144 mmol/L   Potassium 4.8 3.5 - 5.2 mmol/L   Chloride 102 96 - 106 mmol/L   CO2 24 20 - 29 mmol/L   Calcium 10.1 8.7 - 10.2 mg/dL   Total Protein 6.8 6.0 - 8.5 g/dL   Albumin 5.2 (H) 4.0 - 5.0 g/dL   Globulin, Total 1.6 1.5 - 4.5 g/dL   Albumin/Globulin Ratio 3.3 (H) 1.2 - 2.2   Bilirubin Total 0.5 0.0 - 1.2 mg/dL   Alkaline Phosphatase 54 44 - 121 IU/L   AST 22 0 - 40 IU/L   ALT  13 0 - 44 IU/L  Lipid Panel w/o Chol/HDL Ratio  Result Value Ref Range   Cholesterol, Total 204 (H) 100 - 199 mg/dL   Triglycerides 62 0 - 149 mg/dL   HDL 63 >39 mg/dL   VLDL Cholesterol Cal 11 5 - 40 mg/dL   LDL Chol Calc (NIH) 130 (H) 0 - 99 mg/dL  CBC with Differential/Platelet  Result Value Ref Range   WBC 4.7 3.4 - 10.8 x10E3/uL   RBC 4.75 4.14 - 5.80 x10E6/uL   Hemoglobin 14.2 13.0 -  17.7 g/dL   Hematocrit 42.1 37.5 - 51.0 %   MCV 89 79 - 97 fL   MCH 29.9 26.6 - 33.0 pg   MCHC 33.7 31.5 - 35.7 g/dL   RDW 13.3 11.6 - 15.4 %   Platelets 245 150 - 450 x10E3/uL   Neutrophils 53 Not Estab. %   Lymphs 38 Not Estab. %   Monocytes 8 Not Estab. %   Eos 1 Not Estab. %   Basos 0 Not Estab. %   Neutrophils Absolute 2.4 1.4 - 7.0 x10E3/uL   Lymphocytes Absolute 1.8 0.7 - 3.1 x10E3/uL   Monocytes Absolute 0.4 0.1 - 0.9 x10E3/uL   EOS (ABSOLUTE) 0.1 0.0 - 0.4 x10E3/uL   Basophils Absolute 0.0 0.0 - 0.2 x10E3/uL   Immature Granulocytes 0 Not Estab. %   Immature Grans (Abs) 0.0 0.0 - 0.1 x10E3/uL      Assessment & Plan:   Problem List Items Addressed This Visit   None    Discussed aspirin prophylaxis for myocardial infarction prevention and decision was {Blank single:19197::"it was not indicated","made to continue ASA","made to start ASA","made to stop ASA","that we recommended ASA, and patient refused"}  LABORATORY TESTING:  Health maintenance labs ordered today as discussed above.   The natural history of prostate cancer and ongoing controversy regarding screening and potential treatment outcomes of prostate cancer has been discussed with the patient. The meaning of a false positive PSA and a false negative PSA has been discussed. He indicates understanding of the limitations of this screening test and wishes *** to proceed with screening PSA testing.   IMMUNIZATIONS:   - Tdap: Tetanus vaccination status reviewed: {tetanus status:315746}. - Influenza: {Blank single:19197::"Up  to date","Administered today","Postponed to flu season","Refused","Given elsewhere"} - Pneumovax: {Blank single:19197::"Up to date","Administered today","Not applicable","Refused","Given elsewhere"} - Prevnar: {Blank single:19197::"Up to date","Administered today","Not applicable","Refused","Given elsewhere"} - COVID: {Blank single:19197::"Up to date","Administered today","Not applicable","Refused","Given elsewhere"} - HPV: {Blank single:19197::"Up to date","Administered today","Not applicable","Refused","Given elsewhere"} - Shingrix vaccine: {Blank single:19197::"Up to date","Administered today","Not applicable","Refused","Given elsewhere"}  SCREENING: - Colonoscopy: {Blank single:19197::"Up to date","Ordered today","Not applicable","Refused","Done elsewhere"}  Discussed with patient purpose of the colonoscopy is to detect colon cancer at curable precancerous or early stages   - AAA Screening: {Blank single:19197::"Up to date","Ordered today","Not applicable","Refused","Done elsewhere"}  -Hearing Test: {Blank single:19197::"Up to date","Ordered today","Not applicable","Refused","Done elsewhere"}  -Spirometry: {Blank single:19197::"Up to date","Ordered today","Not applicable","Refused","Done elsewhere"}   PATIENT COUNSELING:    Sexuality: Discussed sexually transmitted diseases, partner selection, use of condoms, avoidance of unintended pregnancy  and contraceptive alternatives.   Advised to avoid cigarette smoking.  I discussed with the patient that most people either abstain from alcohol or drink within safe limits (<=14/week and <=4 drinks/occasion for males, <=7/weeks and <= 3 drinks/occasion for females) and that the risk for alcohol disorders and other health effects rises proportionally with the number of drinks per week and how often a drinker exceeds daily limits.  Discussed cessation/primary prevention of drug use and availability of treatment for abuse.   Diet: Encouraged to  adjust caloric intake to maintain  or achieve ideal body weight, to reduce intake of dietary saturated fat and total fat, to limit sodium intake by avoiding high sodium foods and not adding table salt, and to maintain adequate dietary potassium and calcium preferably from fresh fruits, vegetables, and low-fat dairy products.    stressed the importance of regular exercise  Injury prevention: Discussed safety belts, safety helmets, smoke detector, smoking near bedding or upholstery.   Dental health: Discussed importance of regular tooth brushing,  flossing, and dental visits.   Follow up plan: NEXT PREVENTATIVE PHYSICAL DUE IN 1 YEAR. No follow-ups on file.

## 2022-03-14 NOTE — Progress Notes (Unsigned)
PROVIDER NOTE: Information contained herein reflects review and annotations entered in association with encounter. Interpretation of such information and data should be left to medically-trained personnel. Information provided to patient can be located elsewhere in the medical record under "Patient Instructions". Document created using STT-dictation technology, any transcriptional errors that may result from process are unintentional.    Patient: Joel Carr  Service Category: E/M  Provider: Gaspar Cola, MD  DOB: 1985-09-09  DOS: 03/15/2022  Referring Provider: No ref. provider found  MRN: [id number]  Specialty: Interventional Pain Management  PCP: Charlynne Cousins, MD (Inactive)  Type: Established Patient  Setting: Ambulatory outpatient    Location: Office  Delivery: Face-to-face     HPI  Joel Carr, a 36 y.o. year old male, is here today because of his No primary diagnosis found.. Joel Carr primary complain today is No chief complaint on file. Last encounter: My last encounter with him was on Visit date not found. Pertinent problems: Joel Carr has RLS (restless legs syndrome); Tension headache; Migraine; Chronic foot pain (Left); and Chronic pain syndrome on their pertinent problem list. Pain Assessment: Severity of   is reported as a  /10. Location:    / . Onset:  . Quality:  . Timing:  . Modifying factor(s):  Marland Kitchen Vitals:  vitals were not taken for this visit.  BMI: Estimated body mass index is 23.42 kg/m as calculated from the following:   Height as of 08/12/21: 5' 6.93" (1.7 m).   Weight as of 08/12/21: 149 lb 3.2 oz (67.7 kg).  Reason for encounter: evaluation of worsening, or previously known (established) problem. ***  Pharmacotherapy Assessment  Analgesic: None MME/day: 0 mg/day   Monitoring: [place] PMP: PDMP not reviewed this encounter.       Pharmacotherapy: No side-effects or adverse reactions reported. Compliance: No problems identified. Effectiveness: Clinically  acceptable.  No notes on file  No results found for: "CBDTHCR" No results found for: "D8THCCBX" No results found for: "D9THCCBX"  UDS:  Summary  Date Value Ref Range Status  05/11/2021 Note  Final    Comment:    ==================================================================== Compliance Drug Analysis, Ur ==================================================================== Test                             Result       Flag       Units  Drug Present and Declared for Prescription Verification   Desmethyldiazepam              225          EXPECTED   ng/mg creat   Oxazepam                       1367         EXPECTED   ng/mg creat   Temazepam                      762          EXPECTED   ng/mg creat    Desmethyldiazepam, oxazepam, and temazepam are expected metabolites    of diazepam. Desmethyldiazepam and oxazepam are also expected    metabolites of other drugs, including chlordiazepoxide, prazepam,    clorazepate, and halazepam. Oxazepam is an expected metabolite of    temazepam. Oxazepam and temazepam are also available as scheduled    prescription medications.    Gabapentin  PRESENT      EXPECTED   Lamotrigine                    PRESENT      EXPECTED   Lurasidone                     PRESENT      EXPECTED   Acetaminophen                  PRESENT      EXPECTED   Verapamil                      PRESENT      EXPECTED  Drug Present not Declared for Prescription Verification   Carboxy-THC                    113          UNEXPECTED ng/mg creat    Carboxy-THC is a metabolite of tetrahydrocannabinol (THC). Source of    THC is most commonly herbal marijuana or marijuana-based products,    but THC is also present in a scheduled prescription medication.    Trace amounts of THC can be present in hemp and cannabidiol (CBD)    products. This test is not intended to distinguish between delta-9-    tetrahydrocannabinol, the predominant form of THC in most herbal or     marijuana-based products, and delta-8-tetrahydrocannabinol.  Drug Absent but Declared for Prescription Verification   Quetiapine                     Not Detected UNEXPECTED   Diclofenac                     Not Detected UNEXPECTED    Diclofenac, as indicated in the declared medication list, is not    always detected even when used as directed.  ==================================================================== Test                      Result    Flag   Units      Ref Range   Creatinine              63               mg/dL      >=20 ==================================================================== Declared Medications:  The flagging and interpretation on this report are based on the  following declared medications.  Unexpected results may arise from  inaccuracies in the declared medications.   **Note: The testing scope of this panel includes these medications:   Diazepam (Valium)  Gabapentin (Neurontin)  Lamotrigine (Lamictal)  Lurasidone (Latuda)  Quetiapine (Seroquel)  Verapamil (Calan)   **Note: The testing scope of this panel does not include small to  moderate amounts of these reported medications:   Acetaminophen (Tylenol)  Diclofenac (Voltaren)   **Note: The testing scope of this panel does not include the  following reported medications:   Atorvastatin (Lipitor)  Fenofibrate (TriCor)  Multivitamin  Prednisone (Deltasone)  Sumatriptan (Imitrex) ==================================================================== For clinical consultation, please call [phone]. ====================================================================       ROS  Constitutional: Denies any fever or chills Gastrointestinal: No reported hemesis, hematochezia, vomiting, or acute GI distress Musculoskeletal: Denies any acute onset joint swelling, redness, loss of ROM, or weakness Neurological: No reported episodes of acute onset apraxia, aphasia, dysarthria, agnosia, amnesia,  paralysis, loss of coordination, or loss  of consciousness  Medication Review  QUEtiapine, SUMAtriptan, acetaminophen, atorvastatin, diazepam, fenofibrate, gabapentin, lamoTRIgine, multivitamin with minerals, and verapamil  History Review  Allergy: Joel Carr is allergic to tramadol. Drug: Joel Carr  reports no history of drug use. Alcohol:  reports that he does not currently use alcohol. Tobacco:  reports that he quit smoking about 12 months ago. His smoking use included cigarettes. He has a 18.00 pack-year smoking history. He has never used smokeless tobacco. Social: Joel Carr  reports that he quit smoking about 12 months ago. His smoking use included cigarettes. He has a 18.00 pack-year smoking history. He has never used smokeless tobacco. He reports that he does not currently use alcohol. He reports that he does not use drugs. Medical:  has a past medical history of Anxiety, Bipolar disorder ([place]), COVID-19 virus infection (12/23/2019), Depression, History of kidney stones, Hyperlipidemia, Pneumonia, and RLS (restless legs syndrome). Surgical: Joel Carr  has a past surgical history that includes Testicle surgery (N/A); Knee arthroscopy with meniscal repair (Right, 10/29/2014); Tympanoplasty (Bilateral, 1990); Arthrodesis metatarsal (Left, 09/19/2020); Open reduction internal fixation (orif) foot lisfranc fracture (Left, 09/19/2020); and Foot arthrodesis (Left, 09/19/2020). Family: family history includes Alcohol abuse in his father; Depression in his father; Diabetes in his paternal grandfather; Heart attack in his paternal grandfather.  Laboratory Chemistry Profile   Renal Lab Results  Component Value Date   BUN 11 06/12/2021   CREATININE 0.93 06/12/2021   BCR 12 06/12/2021   GFR 113.83 01/22/2020   GFRAA 131 08/08/2019   GFRNONAA >60 08/20/2020    Hepatic Lab Results  Component Value Date   AST 22 06/12/2021   ALT 13 06/12/2021   ALBUMIN 5.2 (H) 06/12/2021   ALKPHOS 54 06/12/2021    LIPASE 26 09/02/2016    Electrolytes Lab Results  Component Value Date   NA 140 06/12/2021   K 4.8 06/12/2021   CL 102 06/12/2021   CALCIUM 10.1 06/12/2021   MG 2.0 05/11/2021    Bone Lab Results  Component Value Date   25OHVITD1 35 05/11/2021   25OHVITD2 <1.0 05/11/2021   25OHVITD3 35 05/11/2021    Inflammation (CRP: Acute Phase) (ESR: Chronic Phase) Lab Results  Component Value Date   CRP <1 05/11/2021   ESRSEDRATE 2 05/11/2021         Note: Above Lab results reviewed.  Recent Imaging Review  DG Foot Complete Left CLINICAL DATA:  Left foot pain status post surgery one year ago.  EXAM: LEFT FOOT - COMPLETE 3+ VIEW  COMPARISON:  X-ray/CT foot 09/07/2020.  FINDINGS: Fixation plates and screws involving the first through third tarsometatarsal joints intact. No acute fracture or dislocation. Remainder of the exam is unremarkable.  IMPRESSION: 1. No acute findings. 2. Hardware intact over the first through third tarsometatarsal joints.  Electronically Signed   By: Marin Olp M.D.   On: 05/11/2021 13:25 Note: Reviewed        Physical Exam  General appearance: Well nourished, well developed, and well hydrated. In no apparent acute distress Mental status: Alert, oriented x 3 (person, place, & time)       Respiratory: No evidence of acute respiratory distress Eyes: PERLA Vitals: There were no vitals taken for this visit. BMI: Estimated body mass index is 23.42 kg/m as calculated from the following:   Height as of 08/12/21: 5' 6.93" (1.7 m).   Weight as of 08/12/21: 149 lb 3.2 oz (67.7 kg). Ideal: Patient weight not recorded  Assessment   Diagnosis Status  No  diagnosis found. Controlled Controlled Controlled   Updated Problems: No problems updated.  Plan of Care  Problem-specific:  No problem-specific Assessment & Plan notes found for this encounter.  Joel Carr has a current medication list which includes the following long-term  medication(s): atorvastatin, fenofibrate, gabapentin, lamotrigine, quetiapine, sumatriptan, and verapamil.  Pharmacotherapy (Medications Ordered): No orders of the defined types were placed in this encounter.  Orders:  No orders of the defined types were placed in this encounter.  Follow-up plan:   No follow-ups on file.     Interventional Therapies  Risk  Complexity Considerations:   Estimated body mass index is 25.54 kg/m as calculated from the following:   Height as of this encounter: _0  (1.727 m).   Weight as of this encounter: 168 lb (76.2 kg). WNL   Planned  Pending:      Under consideration:   Diagnostic left foot steroid injections   Completed:   None at this time   Therapeutic  Palliative (PRN) options:   None established      Recent Visits No visits were found meeting these conditions. Showing recent visits within past 90 days and meeting all other requirements Future Appointments Date Type Provider Dept  03/15/22 Appointment Milinda Pointer, MD [facility]  Showing future appointments within next 90 days and meeting all other requirements  I discussed the assessment and treatment plan with the patient. The patient was provided an opportunity to ask questions and all were answered. The patient agreed with the plan and demonstrated an understanding of the instructions.  Patient advised to call back or seek an in-person evaluation if the symptoms or condition worsens.  Duration of encounter: *** minutes.  Total time on encounter, as per AMA guidelines included both the face-to-face and non-face-to-face time personally spent by the physician and/or other qualified health care professional(s) on the day of the encounter (includes time in activities that require the physician or other qualified health care professional and does not include time in activities normally performed by clinical staff). Physician's time may include the following  activities when performed: preparing to see the patient (eg, review of tests, pre-charting review of records) obtaining and/or reviewing separately obtained history performing a medically appropriate examination and/or evaluation counseling and educating the patient/family/caregiver ordering medications, tests, or procedures referring and communicating with other health care professionals (when not separately reported) documenting clinical information in the electronic or other health record independently interpreting results (not separately reported) and communicating results to the patient/ family/caregiver care coordination (not separately reported)  Note by: Gaspar Cola, MD Date: 03/15/2022; Time: 9:56 AM

## 2022-03-15 ENCOUNTER — Ambulatory Visit: Payer: BC Managed Care – PPO | Attending: Pain Medicine | Admitting: Pain Medicine

## 2022-03-15 ENCOUNTER — Encounter: Payer: Self-pay | Admitting: Pain Medicine

## 2022-03-15 VITALS — BP 134/96 | HR 85 | Temp 97.9°F | Ht 68.0 in | Wt 148.0 lb

## 2022-03-15 DIAGNOSIS — M79672 Pain in left foot: Secondary | ICD-10-CM | POA: Insufficient documentation

## 2022-03-15 DIAGNOSIS — G894 Chronic pain syndrome: Secondary | ICD-10-CM | POA: Insufficient documentation

## 2022-03-15 DIAGNOSIS — G8929 Other chronic pain: Secondary | ICD-10-CM | POA: Insufficient documentation

## 2022-03-15 NOTE — Progress Notes (Signed)
Safety precautions to be maintained throughout the outpatient stay will include: orient to surroundings, keep bed in low position, maintain call bell within reach at all times, provide assistance with transfer out of bed and ambulation.  

## 2022-03-15 NOTE — Patient Instructions (Signed)
______________________________________________________________________  Preparing for your procedure  During your procedure appointment there will be: No Prescription Refills. No disability issues to discussed. No medication changes or discussions.  Instructions: Food intake: Avoid eating anything solid for at least 8 hours prior to your procedure. Clear liquid intake: You may take clear liquids such as water up to 2 hours prior to your procedure. (No carbonated drinks. No soda.) Transportation: Unless otherwise stated by your physician, bring a driver. Morning Medicines: Except for blood thinners, take all of your other morning medications with a sip of water. Make sure to take your heart and blood pressure medicines. If your blood pressure's lower number is above 100, the case will be rescheduled. Blood thinners: If you take a blood thinner, but were not instructed to stop it, call our office [phone] and ask to talk to a nurse. Not stopping a blood thinner prior to certain procedures could lead to serious complications. Diabetics on insulin: Notify the staff so that you can be scheduled 1st case in the morning. If your diabetes requires high dose insulin, take only  of your normal insulin dose the morning of the procedure and notify the staff that you have done so. Preventing infections: Shower with an antibacterial soap the morning of your procedure.  Build-up your immune system: Take 1000 mg of Vitamin C with every meal (3 times a day) the day prior to your procedure. Antibiotics: Inform the nursing staff if you are taking any antibiotics or if you have any conditions that may require antibiotics prior to procedures. (Example: recent joint implants)   Pregnancy: If you are pregnant make sure to notify the nursing staff. Not doing so may result in injury to the fetus, including death.  Sickness: If you have a cold, fever, or any active infections, call and cancel or reschedule your  procedure. Receiving steroids while having an infection may result in complications. Arrival: You must be in the facility at least 30 minutes prior to your scheduled procedure. Tardiness: Your scheduled time is also the cutoff time. If you do not arrive at least 15 minutes prior to your procedure, you will be rescheduled.  Children: Do not bring any children with you. Make arrangements to keep them home. Dress appropriately: There is always a possibility that your clothing may get soiled. Avoid long dresses. Valuables: Do not bring any jewelry or valuables.  Reasons to call and reschedule or cancel your procedure: (Following these recommendations will minimize the risk of a serious complication.) Surgeries: Avoid having procedures within 2 weeks of any surgery. (Avoid for 2 weeks before or after any surgery). Flu Shots: Avoid having procedures within 2 weeks of a flu shots or . (Avoid for 2 weeks before or after immunizations). Barium: Avoid having a procedure within 7-10 days after having had a radiological study involving the use of radiological contrast. (Myelograms, Barium swallow or enema study). Heart attacks: Avoid any elective procedures or surgeries for the initial 6 months after a "Myocardial Infarction" (Heart Attack). Blood thinners: It is imperative that you stop these medications before procedures. Let us know if you if you take any blood thinner.  Infection: Avoid procedures during or within two weeks of an infection (including chest colds or gastrointestinal problems). Symptoms associated with infections include: Localized redness, fever, chills, night sweats or profuse sweating, burning sensation when voiding, cough, congestion, stuffiness, runny nose, sore throat, diarrhea, nausea, vomiting, cold or Flu symptoms, recent or current infections. It is specially important if the infection is   over the area that we intend to treat. Heart and lung problems: Symptoms that may suggest an  active cardiopulmonary problem include: cough, chest pain, breathing difficulties or shortness of breath, dizziness, ankle swelling, uncontrolled high or unusually low blood pressure, and/or palpitations. If you are experiencing any of these symptoms, cancel your procedure and contact your primary care physician for an evaluation.  Remember:  Regular Business hours are:  Monday to Thursday 8:00 AM to 4:00 PM  Provider's Schedule: Josel Keo, MD:  Procedure days: Tuesday and Thursday 7:30 AM to 4:00 PM  Bilal Lateef, MD:  Procedure days: Monday and Wednesday 7:30 AM to 4:00 PM  ______________________________________________________________________    ____________________________________________________________________________________________  General Risks and Possible Complications  Patient Responsibilities: It is important that you read this as it is part of your informed consent. It is our duty to inform you of the risks and possible complications associated with treatments offered to you. It is your responsibility as a patient to read this and to ask questions about anything that is not clear or that you believe was not covered in this document.  Patient's Rights: You have the right to refuse treatment. You also have the right to change your mind, even after initially having agreed to have the treatment done. However, under this last option, if you wait until the last second to change your mind, you may be charged for the materials used up to that point.  Introduction: Medicine is not an exact science. Everything in Medicine, including the lack of treatment(s), carries the potential for danger, harm, or loss (which is by definition: Risk). In Medicine, a complication is a secondary problem, condition, or disease that can aggravate an already existing one. All treatments carry the risk of possible complications. The fact that a side effects or complications occurs, does not imply  that the treatment was conducted incorrectly. It must be clearly understood that these can happen even when everything is done following the highest safety standards.  No treatment: You can choose not to proceed with the proposed treatment alternative. The "PRO(s)" would include: avoiding the risk of complications associated with the therapy. The "CON(s)" would include: not getting any of the treatment benefits. These benefits fall under one of three categories: diagnostic; therapeutic; and/or palliative. Diagnostic benefits include: getting information which can ultimately lead to improvement of the disease or symptom(s). Therapeutic benefits are those associated with the successful treatment of the disease. Finally, palliative benefits are those related to the decrease of the primary symptoms, without necessarily curing the condition (example: decreasing the pain from a flare-up of a chronic condition, such as incurable terminal cancer).  General Risks and Complications: These are associated to most interventional treatments. They can occur alone, or in combination. They fall under one of the following six (6) categories: no benefit or worsening of symptoms; bleeding; infection; nerve damage; allergic reactions; and/or death. No benefits or worsening of symptoms: In Medicine there are no guarantees, only probabilities. No healthcare provider can ever guarantee that a medical treatment will work, they can only state the probability that it may. Furthermore, there is always the possibility that the condition may worsen, either directly, or indirectly, as a consequence of the treatment. Bleeding: This is more common if the patient is taking a blood thinner, either prescription or over the counter (example: Goody Powders, Fish oil, Aspirin, Garlic, etc.), or if suffering a condition associated with impaired coagulation (example: Hemophilia, cirrhosis of the liver, low platelet counts, etc.). However, even if   you  do not have one on these, it can still happen. If you have any of these conditions, or take one of these drugs, make sure to notify your treating physician. Infection: This is more common in patients with a compromised immune system, either due to disease (example: diabetes, cancer, human immunodeficiency virus [HIV], etc.), or due to medications or treatments (example: therapies used to treat cancer and rheumatological diseases). However, even if you do not have one on these, it can still happen. If you have any of these conditions, or take one of these drugs, make sure to notify your treating physician. Nerve Damage: This is more common when the treatment is an invasive one, but it can also happen with the use of medications, such as those used in the treatment of cancer. The damage can occur to small secondary nerves, or to large primary ones, such as those in the spinal cord and brain. This damage may be temporary or permanent and it may lead to impairments that can range from temporary numbness to permanent paralysis and/or brain death. Allergic Reactions: Any time a substance or material comes in contact with our body, there is the possibility of an allergic reaction. These can range from a mild skin rash (contact dermatitis) to a severe systemic reaction (anaphylactic reaction), which can result in death. Death: In general, any medical intervention can result in death, most of the time due to an unforeseen complication. ____________________________________________________________________________________________    

## 2022-06-29 ENCOUNTER — Other Ambulatory Visit: Payer: Self-pay

## 2022-06-29 ENCOUNTER — Emergency Department: Payer: 59

## 2022-06-29 ENCOUNTER — Emergency Department
Admission: EM | Admit: 2022-06-29 | Discharge: 2022-06-29 | Disposition: A | Discharge status: Home / Self Care | Payer: 59 | Attending: Emergency Medicine | Admitting: Emergency Medicine

## 2022-06-29 DIAGNOSIS — G8929 Other chronic pain: Secondary | ICD-10-CM | POA: Insufficient documentation

## 2022-06-29 DIAGNOSIS — J45909 Unspecified asthma, uncomplicated: Secondary | ICD-10-CM | POA: Insufficient documentation

## 2022-06-29 DIAGNOSIS — Z8781 Personal history of (healed) traumatic fracture: Secondary | ICD-10-CM | POA: Diagnosis not present

## 2022-06-29 DIAGNOSIS — M79672 Pain in left foot: Secondary | ICD-10-CM | POA: Insufficient documentation

## 2022-06-29 MED ORDER — NABUMETONE 500 MG PO TABS: 500.0000 mg | ORAL_TABLET | Freq: Two times a day (BID) | ORAL | 0 refills | Status: DC

## 2022-06-29 NOTE — Discharge Instructions (Signed)
Call Dr. Deborra Medina office today make an appointment for follow-up after being seen in the emergency department.  Continue the gabapentin as prescribed.  A prescription for nabumetone was sent to the pharmacy for you begin taking twice a day with food.  Dr. Luana Shu or your primary care provider can continue with this medication if you see improvement.  Also make an appointment with Dr. Dossie Arbour if you continue to have chronic foot pain.

## 2022-06-29 NOTE — ED Provider Notes (Signed)
[facility] Provider Note    Event Date/Time   First MD Initiated Contact with Patient 06/29/22 0745     (approximate)   History   Foot Pain   HPI  Joel Carr is a 37 y.o. male   presents to the ED with complaint of left foot pain for the last 3 days.  Patient reports that he had surgery in 2022 due to an injury where he stepped off a stool and landed wrong.  Surgery was done by Dr. Luana Shu and podiatry at [facility].  Patient denies any recent injuries and has been taking over-the-counter medications without any relief.  He continues to take gabapentin twice a day as previously described by Dr. Dossie Arbour but does not believe that it helps with his foot pain.  He is no longer being seen at the pain clinic due to financial reasons.  Patient has a history of chronic left foot pain, bipolar disorder, migraines, history of asthma, anxiety, RLS.      Physical Exam   Triage Vital Signs: ED Triage Vitals [06/29/22 0741]  Enc Vitals Group     BP (!) 128/96     Pulse Rate 65     Resp 16     Temp 97.7 F (36.5 C)     Temp Source Oral     SpO2 100 %     Weight 147 lb 14.9 oz (67.1 kg)     Height 5\' 8"  (1.727 m)     Head Circumference      Peak Flow      Pain Score 8     Pain Loc      Pain Edu?      Excl. in [place]?     Most recent vital signs: Vitals:   06/29/22 0741  BP: (!) 128/96  Pulse: 65  Resp: 16  Temp: 97.7 F (36.5 C)  SpO2: 100%     General: Awake, no distress.  CV:  Good peripheral perfusion.  Resp:  Normal effort.  Abd:  No distention.  Other:  Left foot no gross deformity or soft tissue edema present.  No discoloration present.  Generalized tenderness on palpation of the dorsal aspect of the left foot.  Skin is intact.  Capillary refills less than 3 seconds.   ED Results / Procedures / Treatments   Labs (all labs ordered are listed, but only abnormal results are displayed) Labs Reviewed - No data to  display    RADIOLOGY X-ray of left foot images reviewed and interpreted by myself shows postsurgical hardware in good placement with no damage.  No new fractures or dislocation.    PROCEDURES:  Critical Care performed:   Procedures   MEDICATIONS ORDERED IN ED: Medications - No data to display   IMPRESSION / MDM / [place] / ED COURSE  I reviewed the triage vital signs and the nursing notes.   Differential diagnosis includes, but is not limited to, left foot fracture, osteoarthritis, left foot sprain, chronic left foot pain.  37 year old male presents to the ED with complaint of left foot pain without recent injury.  Patient had surgery for Lisfranc dislocation/fracture in 2022 and at that time saw Dr. Luana Shu who is in the podiatry apartment at [facility].  Patient states he has not seen him since he was released to return to work.  X-rays today are reassuring and patient was made aware that.  He is continue gabapentin for his chronic foot pain.  He  will discontinue over-the-counter medication and try Relafen 500 mg twice daily with food.  He is strongly encouraged to make an appointment with Dr. Luana Shu and also continue seeing Dr. Dossie Arbour for his chronic pain syndrome.      Patient's presentation is most consistent with acute complicated illness / injury requiring diagnostic workup.  FINAL CLINICAL IMPRESSION(S) / ED DIAGNOSES   Final diagnoses:  Chronic foot pain, left     Rx / DC Orders   ED Discharge Orders          Ordered    nabumetone (RELAFEN) 500 MG tablet  2 times daily        06/29/22 [id number]             Note:  This document was prepared using Dragon voice recognition software and may include unintentional dictation errors.   Johnn Hai, PA-C 06/29/22 0950    Carrie Mew, MD 06/30/22 [phone]

## 2022-06-29 NOTE — ED Triage Notes (Signed)
Pt here with left foot pain x3 days. Pt states he had a surgery on that foot in 2022 and had rods and screws put in it. Pt states he jumped off a step stool and landed wrong. Pt ambulatory to triage.

## 2022-11-04 ENCOUNTER — Other Ambulatory Visit: Payer: Self-pay | Admitting: Podiatry

## 2022-11-04 DIAGNOSIS — M19172 Post-traumatic osteoarthritis, left ankle and foot: Secondary | ICD-10-CM

## 2022-11-15 ENCOUNTER — Ambulatory Visit: Admission: RE | Admit: 2022-11-15 | Payer: 59 | Source: Ambulatory Visit

## 2022-11-15 DIAGNOSIS — M19172 Post-traumatic osteoarthritis, left ankle and foot: Secondary | ICD-10-CM

## 2023-01-04 ENCOUNTER — Encounter: Payer: Self-pay | Admitting: Podiatry

## 2023-01-04 NOTE — Anesthesia Preprocedure Evaluation (Addendum)
Anesthesia Evaluation  Patient identified by MRN, date of birth, ID band Patient awake    Reviewed: Allergy & Precautions, H&P , NPO status , Patient's Chart, lab work & pertinent test results  Airway Mallampati: I  TM Distance: >3 FB Neck ROM: Full    Dental no notable dental hx.    Pulmonary neg pulmonary ROS, pneumonia, COPD, Patient abstained from smoking., former smoker   Pulmonary exam normal breath sounds clear to auscultation       Cardiovascular + DOE  negative cardio ROS Normal cardiovascular exam Rhythm:Regular Rate:Normal     Neuro/Psych  Headaches PSYCHIATRIC DISORDERS Anxiety Depression Bipolar Disorder   negative neurological ROS  negative psych ROS   GI/Hepatic negative GI ROS, Neg liver ROS,GERD  ,,  Endo/Other  negative endocrine ROS    Renal/GU negative Renal ROS  negative genitourinary   Musculoskeletal negative musculoskeletal ROS (+)    Abdominal   Peds negative pediatric ROS (+)  Hematology negative hematology ROS (+)   Anesthesia Other Findings Bipolar disorder   Depression Anxiety Hyperlipidemia  RLS (restless legs syndrome) COVID-19 virus infection  Anxiety Pneumonia  History of kidney stones History of migraine  Chronic pain syndrome    Reproductive/Obstetrics negative OB ROS                             Anesthesia Physical Anesthesia Plan  ASA: 2  Anesthesia Plan: General   Post-op Pain Management:    Induction: Intravenous  PONV Risk Score and Plan:   Airway Management Planned: Oral ETT  Additional Equipment:   Intra-op Plan:   Post-operative Plan: Extubation in OR  Informed Consent: I have reviewed the patients History and Physical, chart, labs and discussed the procedure including the risks, benefits and alternatives for the proposed anesthesia with the patient or authorized representative who has indicated his/her understanding and  acceptance.     Dental Advisory Given  Plan Discussed with: Anesthesiologist, CRNA and Surgeon  Anesthesia Plan Comments: (Patient consented for risks of anesthesia including but not limited to:  - adverse reactions to medications - damage to eyes, teeth, lips or other oral mucosa - nerve damage due to positioning  - sore throat or hoarseness - Damage to heart, brain, nerves, lungs, other parts of body or loss of life  Patient voiced understanding and assent.)       Anesthesia Quick Evaluation

## 2023-01-06 NOTE — Discharge Instructions (Addendum)
[facility]  POST OPERATIVE INSTRUCTIONS FOR DR. Ether Griffins AND DR. Delene Morais [facility] CLINIC PODIATRY DEPARTMENT   Take your medication as prescribed.  Pain medication should be taken only as needed.  You may take Tylenol in addition to pain medication as needed.  If still severe pain try taking 1 pain tablet every 4 hours.  If pain is still severe then try taking 2 pain tablets every 6 hours.  If still severe then try taking 2 pain tablets every 4 hours.  Keep the dressing clean, dry and intact.  Remain nonweightbearing to left lower extremity at all times.  Keep your foot elevated above the heart level for the first 48 hours.  Continue elevation thereafter to improve swelling.  May apply ice to the back of the left knee for maximum 10 minutes out of every 1 hour as needed where the top of the left foot.  Walking to the bathroom and brief periods of walking are acceptable, unless we have instructed you to be non-weight bearing.  Always wear your post-op shoe when walking.  Always use your crutches if you are to be non-weight bearing.  Do not take a shower. Baths are permissible as long as the foot is kept out of the water.   Every hour you are awake:  Bend your knee 15 times. Massage calf 15 times  Call [facility] [phone]) if any of the following problems occur: You develop a temperature or fever. The bandage becomes saturated with blood. Medication does not stop your pain. Injury of the foot occurs. Any symptoms of infection including redness, odor, or red streaks running from wound.  Information for Discharge Teaching: EXPAREL (bupivacaine liposome injectable suspension)   Pain relief is important to your recovery. The goal is to control your pain so you can move easier and return to your normal activities as soon as possible after your procedure. Your physician may use several types of medicines to manage pain, swelling, and  more.  Your surgeon or anesthesiologist gave you EXPAREL(bupivacaine) to help control your pain after surgery.  EXPAREL is a local anesthetic designed to release slowly over an extended period of time to provide pain relief by numbing the tissue around the surgical site. EXPAREL is designed to release pain medication over time and can control pain for up to 72 hours. Depending on how you respond to EXPAREL, you may require less pain medication during your recovery. EXPAREL can help reduce or eliminate the need for opioids during the first few days after surgery when pain relief is needed the most. EXPAREL is not an opioid and is not addictive. It does not cause sleepiness or sedation.   Important! A teal colored band has been placed on your arm with the date, time and amount of EXPAREL you have received. Please leave this armband in place for the full 96 hours following administration, and then you may remove the band. If you return to the hospital for any reason within 96 hours following the administration of EXPAREL, the armband provides important information that your health care providers to know, and alerts them that you have received this anesthetic.    Possible side effects of EXPAREL: Temporary loss of sensation or ability to move in the area where medication was injected. Nausea, vomiting, constipation Rarely, numbness and tingling in your mouth or lips, lightheadedness, or anxiety may occur. Call your doctor right away if you think you may be experiencing any of these sensations, or if  you have other questions regarding possible side effects.  Follow all other discharge instructions given to you by your surgeon or nurse. Eat a healthy diet and drink plenty of water or other fluids.

## 2023-01-10 ENCOUNTER — Other Ambulatory Visit: Payer: Self-pay | Admitting: Podiatry

## 2023-01-13 ENCOUNTER — Other Ambulatory Visit: Payer: Self-pay

## 2023-01-13 ENCOUNTER — Ambulatory Visit: Payer: 59 | Admitting: Anesthesiology

## 2023-01-13 ENCOUNTER — Ambulatory Visit
Admission: RE | Admit: 2023-01-13 | Discharge: 2023-01-13 | Disposition: A | Discharge status: Home / Self Care | Payer: 59 | Attending: Podiatry | Admitting: Podiatry

## 2023-01-13 ENCOUNTER — Ambulatory Visit: Payer: 59 | Admitting: Nurse Practitioner

## 2023-01-13 ENCOUNTER — Encounter
Admission: RE | Disposition: A | Discharge status: Home / Self Care | Payer: Self-pay | Source: Home / Self Care | Attending: Podiatry

## 2023-01-13 ENCOUNTER — Ambulatory Visit: Payer: Self-pay

## 2023-01-13 ENCOUNTER — Encounter: Payer: Self-pay | Admitting: Podiatry

## 2023-01-13 DIAGNOSIS — R519 Headache, unspecified: Secondary | ICD-10-CM | POA: Insufficient documentation

## 2023-01-13 DIAGNOSIS — M19072 Primary osteoarthritis, left ankle and foot: Secondary | ICD-10-CM

## 2023-01-13 DIAGNOSIS — Z87442 Personal history of urinary calculi: Secondary | ICD-10-CM | POA: Insufficient documentation

## 2023-01-13 DIAGNOSIS — F419 Anxiety disorder, unspecified: Secondary | ICD-10-CM | POA: Diagnosis not present

## 2023-01-13 DIAGNOSIS — F319 Bipolar disorder, unspecified: Secondary | ICD-10-CM | POA: Insufficient documentation

## 2023-01-13 DIAGNOSIS — K219 Gastro-esophageal reflux disease without esophagitis: Secondary | ICD-10-CM | POA: Insufficient documentation

## 2023-01-13 DIAGNOSIS — R0609 Other forms of dyspnea: Secondary | ICD-10-CM | POA: Insufficient documentation

## 2023-01-13 DIAGNOSIS — Z87891 Personal history of nicotine dependence: Secondary | ICD-10-CM | POA: Insufficient documentation

## 2023-01-13 DIAGNOSIS — J449 Chronic obstructive pulmonary disease, unspecified: Secondary | ICD-10-CM | POA: Diagnosis not present

## 2023-01-13 DIAGNOSIS — G2581 Restless legs syndrome: Secondary | ICD-10-CM | POA: Insufficient documentation

## 2023-01-13 DIAGNOSIS — E785 Hyperlipidemia, unspecified: Secondary | ICD-10-CM | POA: Insufficient documentation

## 2023-01-13 DIAGNOSIS — Z981 Arthrodesis status: Secondary | ICD-10-CM | POA: Insufficient documentation

## 2023-01-13 DIAGNOSIS — G8929 Other chronic pain: Secondary | ICD-10-CM | POA: Insufficient documentation

## 2023-01-13 HISTORY — DX: Chronic pain syndrome: G89.4

## 2023-01-13 HISTORY — DX: Personal history of other diseases of the nervous system and sense organs: Z86.69

## 2023-01-13 HISTORY — PX: FOOT ARTHRODESIS: SHX1655

## 2023-01-13 HISTORY — PX: ARTHRODESIS METATARSAL: SHX6565

## 2023-01-13 SURGERY — FUSION, JOINT, INVOLVING METATARSAL BONE
Anesthesia: General | Site: Foot | Laterality: Left

## 2023-01-13 MED ORDER — LIDOCAINE HCL (CARDIAC) PF 100 MG/5ML IV SOSY
PREFILLED_SYRINGE | INTRAVENOUS | Status: DC | PRN
  Administered 2023-01-13: 40 mg via INTRAVENOUS

## 2023-01-13 MED ORDER — SODIUM CHLORIDE 0.9% FLUSH
INTRAVENOUS | Status: DC | PRN
Start: 2023-01-13 — End: 2023-01-13
  Administered 2023-01-13 (×2): 10 mL via INTRAVENOUS

## 2023-01-13 MED ORDER — 0.9 % SODIUM CHLORIDE (POUR BTL) OPTIME
TOPICAL | Status: DC | PRN
  Administered 2023-01-13: 250 mL

## 2023-01-13 MED ORDER — OXYCODONE-ACETAMINOPHEN 7.5-325 MG PO TABS
1.0000 | ORAL_TABLET | Freq: Four times a day (QID) | ORAL | 0 refills | Status: AC | PRN
Start: 2023-01-13 — End: 2023-01-20

## 2023-01-13 MED ORDER — BUPIVACAINE HCL (PF) 0.25 % IJ SOLN
INTRAMUSCULAR | Status: DC | PRN
Start: 2023-01-13 — End: 2023-01-13
  Administered 2023-01-13: 30 mL via PERINEURAL

## 2023-01-13 MED ORDER — ONDANSETRON HCL 4 MG/2ML IJ SOLN
INTRAMUSCULAR | Status: DC | PRN
  Administered 2023-01-13: 4 mg via INTRAVENOUS

## 2023-01-13 MED ORDER — BUPIVACAINE HCL 0.25 % IJ SOLN
INTRAMUSCULAR | Status: AC
  Filled 2023-01-13: qty 1

## 2023-01-13 MED ORDER — FENTANYL CITRATE (PF) 100 MCG/2ML IJ SOLN
INTRAMUSCULAR | Status: DC | PRN
  Administered 2023-01-13: 100 ug via INTRAVENOUS

## 2023-01-13 MED ORDER — DEXAMETHASONE SODIUM PHOSPHATE 10 MG/ML IJ SOLN
INTRAMUSCULAR | Status: AC
  Filled 2023-01-13: qty 1

## 2023-01-13 MED ORDER — DEXAMETHASONE SODIUM PHOSPHATE 4 MG/ML IJ SOLN
INTRAMUSCULAR | Status: AC
  Filled 2023-01-13: qty 1

## 2023-01-13 MED ORDER — FENTANYL CITRATE (PF) 100 MCG/2ML IJ SOLN
INTRAMUSCULAR | Status: AC
  Filled 2023-01-13: qty 2

## 2023-01-13 MED ORDER — CEFAZOLIN SODIUM-DEXTROSE 2-3 GM-%(50ML) IV SOLR
INTRAVENOUS | Status: AC
  Filled 2023-01-13: qty 50

## 2023-01-13 MED ORDER — ASPIRIN 81 MG PO TBEC: 81.0000 mg | DELAYED_RELEASE_TABLET | Freq: Two times a day (BID) | ORAL | 0 refills | Status: DC

## 2023-01-13 MED ORDER — MIDAZOLAM HCL 2 MG/2ML IJ SOLN
INTRAMUSCULAR | Status: AC
  Filled 2023-01-13: qty 2

## 2023-01-13 MED ORDER — LIDOCAINE HCL (PF) 2 % IJ SOLN
INTRAMUSCULAR | Status: AC
  Filled 2023-01-13: qty 10

## 2023-01-13 MED ORDER — BUPIVACAINE LIPOSOME 1.3 % IJ SUSP
INTRAMUSCULAR | Status: DC | PRN
  Administered 2023-01-13: 10 mg via PERINEURAL

## 2023-01-13 MED ORDER — BUPIVACAINE LIPOSOME 1.3 % IJ SUSP
INTRAMUSCULAR | Status: AC
  Filled 2023-01-13: qty 10

## 2023-01-13 MED ORDER — PROPOFOL 10 MG/ML IV BOLUS
INTRAVENOUS | Status: DC | PRN
  Administered 2023-01-13: 200 mg via INTRAVENOUS

## 2023-01-13 MED ORDER — KETOROLAC TROMETHAMINE 30 MG/ML IJ SOLN
INTRAMUSCULAR | Status: DC | PRN
Start: 2023-01-13 — End: 2023-01-13
  Administered 2023-01-13: 30 mg via INTRAVENOUS

## 2023-01-13 MED ORDER — DEXAMETHASONE SODIUM PHOSPHATE 10 MG/ML IJ SOLN
INTRAMUSCULAR | Status: DC | PRN
Start: 2023-01-13 — End: 2023-01-13
  Administered 2023-01-13: 10 mg

## 2023-01-13 MED ORDER — LACTATED RINGERS IV SOLN: INTRAVENOUS | Status: DC

## 2023-01-13 MED ORDER — LIDOCAINE HCL (PF) 2 % IJ SOLN
INTRAMUSCULAR | Status: AC
  Filled 2023-01-13: qty 5

## 2023-01-13 MED ORDER — PROPOFOL 10 MG/ML IV BOLUS
INTRAVENOUS | Status: AC
  Filled 2023-01-13: qty 60

## 2023-01-13 MED ORDER — MIDAZOLAM HCL 2 MG/2ML IJ SOLN
2.0000 mg | Freq: Once | INTRAMUSCULAR | Status: AC
  Administered 2023-01-13: 2 mg via INTRAVENOUS

## 2023-01-13 MED ORDER — CEFAZOLIN SODIUM-DEXTROSE 2-4 GM/100ML-% IV SOLN
2.0000 g | INTRAVENOUS | Status: AC
  Administered 2023-01-13: 2 g via INTRAVENOUS

## 2023-01-13 MED ORDER — FENTANYL CITRATE (PF) 100 MCG/2ML IJ SOLN
100.0000 ug | Freq: Once | INTRAMUSCULAR | Status: AC
  Administered 2023-01-13: 50 ug via INTRAVENOUS

## 2023-01-13 MED ORDER — ONDANSETRON HCL 4 MG PO TABS: 4.0000 mg | ORAL_TABLET | Freq: Three times a day (TID) | ORAL | 0 refills | Status: DC | PRN

## 2023-01-13 MED ORDER — DEXAMETHASONE SODIUM PHOSPHATE 10 MG/ML IJ SOLN
INTRAMUSCULAR | Status: DC | PRN
  Administered 2023-01-13: 4 mg via INTRAVENOUS

## 2023-01-13 MED ORDER — AMOXICILLIN-POT CLAVULANATE 875-125 MG PO TABS: 1.0000 | ORAL_TABLET | Freq: Two times a day (BID) | ORAL | 0 refills | Status: DC

## 2023-01-13 SURGICAL SUPPLY — 56 items
BIT DRILL 2 FENESTRATED (MISCELLANEOUS) IMPLANT
BIT DRILL 2.4X140 LONG SOLID (BIT) IMPLANT
BIT DRILL CANNULTD 2.6 X 130MM (DRILL) IMPLANT
BLADE SURG 15 STRL LF DISP TIS (BLADE) IMPLANT
BLADE SURG 15 STRL SS (BLADE) ×1
BNDG CMPR STD VLCR NS LF 5.8X4 (GAUZE/BANDAGES/DRESSINGS) ×1
BNDG CMPR STD VLCR NS LF 5.8X6 (GAUZE/BANDAGES/DRESSINGS) ×1
BNDG ELASTIC 4X5.8 VLCR NS LF (GAUZE/BANDAGES/DRESSINGS) ×1 IMPLANT
BNDG ELASTIC 6X5.8 VLCR NS LF (GAUZE/BANDAGES/DRESSINGS) ×1 IMPLANT
BNDG ESMARCH 4 X 12 STRL LF (GAUZE/BANDAGES/DRESSINGS) ×1
BNDG ESMARCH 4X12 STRL LF (GAUZE/BANDAGES/DRESSINGS) ×1 IMPLANT
BNDG GAUZE DERMACEA FLUFF 4 (GAUZE/BANDAGES/DRESSINGS) ×1 IMPLANT
BNDG GZE DERMACEA 4 6PLY (GAUZE/BANDAGES/DRESSINGS) ×1
CANISTER SUCT 1200ML W/VALVE (MISCELLANEOUS) ×1 IMPLANT
COUNTERSICK 4.0 HEADED (MISCELLANEOUS) ×1
COVER LIGHT HANDLE UNIVERSAL (MISCELLANEOUS) ×2 IMPLANT
DRAPE FLUOR MINI C-ARM 54X84 (DRAPES) ×1 IMPLANT
DRILL CANNULATED 2.6 X 130MM (DRILL) ×1
DURAPREP 26ML APPLICATOR (WOUND CARE) ×1 IMPLANT
ELECT REM PT RETURN 9FT ADLT (ELECTROSURGICAL) ×1
ELECTRODE REM PT RTRN 9FT ADLT (ELECTROSURGICAL) ×1 IMPLANT
GAUZE SPONGE 4X4 12PLY STRL (GAUZE/BANDAGES/DRESSINGS) ×1 IMPLANT
GAUZE XEROFORM 1X8 LF (GAUZE/BANDAGES/DRESSINGS) ×1 IMPLANT
GLOVE BIOGEL PI IND STRL 7.5 (GLOVE) ×1 IMPLANT
GLOVE SURG SS PI 7.0 STRL IVOR (GLOVE) ×1 IMPLANT
GOWN STRL REUS W/ TWL LRG LVL3 (GOWN DISPOSABLE) ×2 IMPLANT
GOWN STRL REUS W/TWL LRG LVL3 (GOWN DISPOSABLE) ×2
GRAFT DMB PUTTY 1 OPTIUM FD (Bone Implant) IMPLANT
INST FASTGRAFTER HARVEST SYS (INSTRUMENTS) ×1
INSTRUMENT FASTGRFTR HRVST SYS (INSTRUMENTS) IMPLANT
K-WIRE SMOOTH TROCAR 2.0X150 (WIRE) ×2
K-WIRE SNGL END 1.2X150 (MISCELLANEOUS) ×1
KIT TURNOVER KIT A (KITS) ×1 IMPLANT
KWIRE SMOOTH TROCAR 2.0X150 (WIRE) IMPLANT
KWIRE SNGL END 1.2X150 (MISCELLANEOUS) IMPLANT
NS IRRIG 500ML POUR BTL (IV SOLUTION) ×1 IMPLANT
PACK EXTREMITY ARMC (MISCELLANEOUS) ×1 IMPLANT
PADDING CAST BLEND 4X4 NS (MISCELLANEOUS) ×3 IMPLANT
PLATE STR SMALL SLANTED LT 3H (Plate) IMPLANT
PUTTY DBM OPTIUM 1CC (Bone Implant) ×1 IMPLANT
SCREW 3.5X16 NONLOCKING (Screw) IMPLANT
SCREW CANN 4.0X40 SHORT THREAD (Screw) IMPLANT
SCREW COUNTERSINK 4.0 HEADED (MISCELLANEOUS) IMPLANT
SCREW LOCK PLATE R3 3.5X16 (Screw) IMPLANT
SCREW LOCK PLATE R3 3.5X20 (Screw) IMPLANT
SCREW LOCK PLATE R3 4.2X18 (Screw) ×1 IMPLANT
SCREW LOCK PLT 18X4.2XNS R3CON (Screw) IMPLANT
SCREW NON LOCK 3.5X18 (Screw) IMPLANT
SPLINT CAST 1 STEP 4X30 (MISCELLANEOUS) ×1 IMPLANT
STOCKINETTE IMPERVIOUS LG (DRAPES) ×1 IMPLANT
SUT ETHILON 3-0 FS-10 30 BLK (SUTURE) ×1
SUT VIC AB 3-0 SH 27 (SUTURE) ×1
SUT VIC AB 3-0 SH 27X BRD (SUTURE) IMPLANT
SUT VIC AB 4-0 FS2 27 (SUTURE) IMPLANT
SUTURE EHLN 3-0 FS-10 30 BLK (SUTURE) IMPLANT
WIRE OLIVE SMOOTH 1.4MMX60MM (WIRE) IMPLANT

## 2023-01-13 NOTE — Anesthesia Procedure Notes (Signed)
Procedure Name: LMA Insertion Date/Time: 01/13/2023 7:38 AM  Performed by: Genia Del, CRNAPre-anesthesia Checklist: Patient identified, Patient being monitored, Timeout performed, Emergency Drugs available and Suction available Patient Re-evaluated:Patient Re-evaluated prior to induction Oxygen Delivery Method: Circle system utilized Preoxygenation: Pre-oxygenation with 100% oxygen Induction Type: IV induction Ventilation: Mask ventilation without difficulty LMA: LMA inserted LMA Size: 4.0 Tube type: Oral Number of attempts: 1 Placement Confirmation: positive ETCO2 and breath sounds checked- equal and bilateral Tube secured with: Tape Dental Injury: Teeth and Oropharynx as per pre-operative assessment  Comments: Eyes taped closed after induction prior to LMA placement.  Ambu AuraOnce brand LMA used with good seal first pass.  Cuff inflated after insertion.

## 2023-01-13 NOTE — Anesthesia Postprocedure Evaluation (Signed)
Anesthesia Post Note  Patient: KENDRIX ORMAN  Procedure(s) Performed: ARTHRODESIS; LISFRANC; MULTIPLE FOURTH AND FIFTH (Left: Foot) AUTOGRAFT HARVEST (Left: Foot)  Patient location during evaluation: PACU Anesthesia Type: General Level of consciousness: awake and alert Pain management: pain level controlled Vital Signs Assessment: post-procedure vital signs reviewed and stable Respiratory status: spontaneous breathing, nonlabored ventilation, respiratory function stable and patient connected to nasal cannula oxygen Cardiovascular status: blood pressure returned to baseline and stable Postop Assessment: no apparent nausea or vomiting Anesthetic complications: no   No notable events documented.   Last Vitals:  Vitals:   01/13/23 1030 01/13/23 1045  BP: 116/83   Pulse: 83 86  Resp: 19 16  Temp:    SpO2: 98% 98%    Last Pain:  Vitals:   01/13/23 1030  TempSrc:   PainSc: 0-No pain                 Vikkie Goeden C Arthelia Callicott

## 2023-01-13 NOTE — Anesthesia Procedure Notes (Addendum)
Anesthesia Regional Block: Popliteal block   Pre-Anesthetic Checklist: , timeout performed,  Correct Patient, Correct Site, Correct Laterality,  Correct Procedure, Correct Position, site marked,  Risks and benefits discussed,  Surgical consent,  Pre-op evaluation,  At surgeon's request and post-op pain management  Laterality: Left  Prep: chloraprep       Needles:  Injection technique: Single-shot  Needle Type: Echogenic Needle     Needle Length: 9cm  Needle Gauge: 21     Additional Needles:   Procedures:,,,, ultrasound used (permanent image in chart),,    Narrative:  Start time: 01/13/2023 7:18 AM End time: 01/13/2023 7:27 AM Injection made incrementally with aspirations every 5 mL.  Performed by: Personally  Anesthesiologist: Marisue Humble, MD  Additional Notes: Functioning IV was confirmed and monitors applied. Ultrasound guidance: relevant anatomy identified, needle position confirmed, local anesthetic spread visualized around nerve(s)., vascular puncture avoided.  Image printed for medical record.  Negative aspiration and no paresthesias; incremental administration of local anesthetic. The patient tolerated the procedure well. Vitals signs recorded in RN notes. Popliteal block at request of surgeon for postop pain, can consider adductor canal block for tourniquet pain.

## 2023-01-13 NOTE — Op Note (Signed)
PODIATRY / FOOT AND ANKLE SURGERY OPERATIVE REPORT    SURGEON: Rosetta Posner, DPM  PRE-OPERATIVE DIAGNOSIS:  1.  Left fourth and fifth tarsometatarsal joint arthritis, posttraumatic status post Lisfranc fracture open reduction with internal fixation  POST-OPERATIVE DIAGNOSIS: Same  PROCEDURE(S): Left fourth and fifth tarsometatarsal joint arthrodesis Calcaneal autograft harvest left  HEMOSTASIS: Left ankle tourniquet  ANESTHESIA: general  ESTIMATED BLOOD LOSS: 30 cc  FINDING(S): 1.  Severe arthritic changes present to the fourth and fifth tarsometatarsal joints but positioning of joint appears to be anatomic  PATHOLOGY/SPECIMEN(S): None  INDICATIONS:   Joel Carr is a 37 y.o. male who presents with chronic pain to the left lateral foot status post Lisfranc open reduction with internal fixation of fourth and fifth tarsometatarsal joints and fusion of 1st through 3rd tarsometatarsal joints after Lisfranc injury that occurred 2 years ago.  Patient has had pain ever since to the lateral column.  He notes no pain to the central or medial column but all of his pain is laterally towards the fourth and fifth tarsometatarsal joint.  He has been treated with steroid injections as well as pain medication but continues to have pain discomfort.  He has seen pain management in the past as well.  Patient had a CT scan performed which did show osteoarthritic changes to the joint that appeared to be moderate to severe.  All treatment options were discussed with the patient both conservative and surgical attempts at correction include potential risks and complications at this time patient is elected for surgical intervention today consisting of left fourth and fifth tarsometatarsal joint fusions with calcaneal autograft harvest.  Discussed also performing arthroplasty type of procedure instead but patient has elected for fusion procedures.  Discussed stiffness that may occur and foot but hope would be to  reduce his chronic pain to a more manageable state.  Patient agreeable to procedure.  No guarantees given.  Consent obtained..  DESCRIPTION: After obtaining full informed written consent, the patient was brought back to the operating room and placed supine upon the operating table.  The patient received IV antibiotics prior to induction.  After obtaining adequate anesthesia, the patient was prepped and draped in the standard fashion.  A popliteal nerve block was performed by anesthesia preoperatively.  An Esmarch bandage was used to exsanguinate the left lower extremity the pneumatic ankle tourniquet was inflated.  Attention was directed to the dorsal lateral aspect of the left foot where the C-arm imaging was used to verify incision site between the fourth and fifth tarsometatarsal joints extending to the cuboid.  The incision was marked and made.  The incision was deepened to the subcutaneous tissues utilizing sharp and blunt dissection and care was taken to identify and retract all vital neurovascular structures and all venous contributories were cauterized necessary.  There appeared to be a fair amount of scar tissue over the area.  Bovie dissection was continued down to bone.  A periosteal incision was made in the fourth metatarsal and fifth metatarsal and the periosteum was reflected medially and laterally thereby exposing the fourth and fifth tarsometatarsal joints at the operative site.  The joints appeared to be hard to expose due to the amount of scar tissue present.  The appeared to be very arthritic in nature with minimal cartilaginous surfaces to the joints.  A osteotome was used to open up the joints further.  The joints were then examined and once again noted to have minimal cartilage overall.  There appeared to be a  slight recess as well in the fourth metatarsal base likely from the previous fracture that was seen on the previous CT scan.  At this time the pin distractor was applied and the  joint surfaces were opened further.  Any remaining articular cartilage was resected and passed off the operative site.  The surgical site was flushed with copious amounts normal sterile saline.  Fenestration was then performed after all cartilage was removed at the joint surfaces.  Attention was then directed to the lateral wall the calcaneus where a small incision was made over the area.  The incision was deepened to bone with blunt dissection with a hemostat.  At this time the Paragon 28 reamer for bone graft harvest was placed into the lateral wall the calcaneus and passed a few times removing some autograft and calcaneus.  The surgical site was flushed with copious amounts normal sterile saline and the incision was reapproximated well coapted with 3-0 nylon.  The autograft was mixed with DBM.  This mixture was then applied to the fourth and fifth tarsometatarsal joints.  The pin distractor was then removed along with the pins.  The joint surfaces appeared to be very well opposed overall in a near anatomic position.  At this time a temporary wire was then placed through the fifth metatarsal base across the fifth tarsometatarsal joint and into the cuboid.  This was checked under fluoroscopic guidance.  Utilizing standard AO principles and techniques a 4.0 x 40 mm partially-threaded headed screw was placed after countersinking a fair bit along with drilling..  There appeared to be some compression but did not appear to be great overall.  This was left intact.  This appeared to compress the joint fairly well but still had some slight motion at the fifth tarsometatarsal joint.  Attention was then directed to the fourth tarsometatarsal joint where a 4-hole Paragon 28 plate was placed across the area.  This was temporary fixated with all of wires and under fluoroscopic guidance was visualized and appeared to be in the appropriate position with 2 screws being able to be placed through the fourth metatarsal and 2  screws in the cuboid.  The proximal screws were then placed through the proximal screw holes x 2 utilizing standard AO principles and techniques which were 3.5 locking screws.  The olive wires were then removed.  At this time the compression hole was then used using eccentric drilling technique and a 3.5 nonlocking screw was placed with excellent compression noted.  1 further locking screw was then placed through the fourth metatarsal locking hole.  This appeared to stabilize the fourth metatarsal well and there did not appear to be any gapping in the joint site and appeared to be greatly compressed with no gapping.  Attention was then directed to the fifth tarsometatarsal joint where once again a 4-hole Paragon 28 plate was placed across the area with temporary olive wire fixation.  Fluoroscopic guidance was used to assess plate placement.  The plate appeared to sit well overall and had good apposition to the bone with the appropriate orientation.  Similar to the fourth tarsometatarsal joint 2 locking screws were then placed that were 3.5 in nature to the cuboid and then using eccentric drilling technique the distal hole was filled with a 3.5 nonlocking screw after the olive wires were removed.  Excellent compression was noted across the fifth tarsometatarsal joint.  The final locking screws then placed to the fifth metatarsal base area and appeared to lock well.  This  appeared to have a solid construct overall.  The screw that was present at the fifth metatarsal base appeared to be prominent so further countersinking was performed after the screw was removed.  The screw was then placed once again but still appeared to be prominent so it was removed as it did not appear to be compressing well.  Final C-arm imaging was then taken showing excellent compression across the fourth and fifth tarsometatarsal joints with plate and screw fixation intact in the appropriate size and length.  Clinically there did not  appear to be any gapping at the fourth and fifth tarsometatarsal joint sites and excellent compression appeared to be present.  Plate and screws appeared to be appropriate.  The surgical sites were flushed with copious amounts normal sterile saline.  The capsular and periosteal tissue was reapproximated well coapted with 3-0 Vicryl.  The subcutaneous tissue was reapproximated well coapted with 4-0 Vicryl.  The skin was then reapproximated well coapted with 3-0 nylon in a combination of simple and horizontal mattress type stitching.  The pneumatic ankle tourniquet was deflated and a prompt hyperemic response was noted all digits left foot.  A postoperative dressing was then applied consisting of Xeroform to the incision sites followed by 4 x 4 gauze, ABD gauze roll, Webril, posterior splint, Ace wrap.  The patient tolerated the procedure and anesthesia well and was transferred to recovery room vital signs stable vascular status intact all toes left foot.  Following period of postoperative monitoring the patient be discharged home with the appropriate orders, instructions, and medications.  Patient is to remain nonweightbearing at all times left lower extremity and will be seen in clinic next week.  COMPLICATIONS: None  CONDITION: Good, stable  Rosetta Posner, DPM

## 2023-01-13 NOTE — Transfer of Care (Signed)
Immediate Anesthesia Transfer of Care Note  Patient: CURLEE BOGAN  Procedure(s) Performed: ARTHRODESIS; LISFRANC; MULTIPLE FOURTH AND FIFTH (Left: Foot) AUTOGRAFT HARVEST (Left: Foot)  Patient Location: PACU  Anesthesia Type:General  Level of Consciousness: awake, alert , and oriented  Airway & Oxygen Therapy: Patient Spontanous Breathing and Patient connected to nasal cannula oxygen  Post-op Assessment: Report given to RN and Post -op Vital signs reviewed and stable  Post vital signs: Reviewed and stable  Last Vitals: See PACU flow sheet for normal temp.  Pt denies pain  Vitals Value Taken Time  BP 150/92 01/13/23 0955  Temp    Pulse 91 01/13/23 1000  Resp 16 01/13/23 1000  SpO2 99 % 01/13/23 1000  Vitals shown include unfiled device data.  Last Pain:  Vitals:   01/13/23 0654  TempSrc: Temporal  PainSc: 3       Patients Stated Pain Goal: 1 (01/13/23 0654)  Complications: No notable events documented.

## 2023-01-13 NOTE — H&P (Signed)
HISTORY AND PHYSICAL INTERVAL NOTE:  01/13/2023  7:12 AM  Joel Carr  has presented today for surgery, with the diagnosis of M19.172 - Post-traumatic arthritis of left foot M79.2 - Neuropathic pain M79.672, G89.29 - Chronic foot pain, left.  The various methods of treatment have been discussed with the patient.  No guarantees were given.  After consideration of risks, benefits and other options for treatment, the patient has consented to surgery.  I have reviewed the patients' chart and labs.    PROCEDURE: LEFT FOOT 4TH AND 5TH TARSOMETATARSAL JOINT FUSIONS CALCANEAL AUTOGRAFT   A history and physical examination was performed in my office.  The patient was reexamined.  There have been no changes to this history and physical examination.  Rosetta Posner, DPM

## 2023-01-13 NOTE — Progress Notes (Signed)
Assisted [place] ANMD with popliteal, ultrasound guided block. Side rails up, monitors on throughout procedure. See vital signs in flow sheet. Tolerated Procedure well.

## 2023-01-17 NOTE — Plan of Care (Signed)
[facility] Tonsillectomy/Adenoidectomy, Postoperative PEDS care plan entered in error.

## 2023-01-18 ENCOUNTER — Encounter: Payer: Self-pay | Admitting: Podiatry

## 2023-02-16 ENCOUNTER — Ambulatory Visit: Payer: 59 | Admitting: Nurse Practitioner

## 2023-02-16 ENCOUNTER — Encounter: Payer: Self-pay | Admitting: Nurse Practitioner

## 2023-02-16 VITALS — BP 124/78 | HR 88 | Temp 98.2°F | Ht 67.0 in | Wt 145.0 lb

## 2023-02-16 DIAGNOSIS — Z23 Encounter for immunization: Secondary | ICD-10-CM | POA: Diagnosis not present

## 2023-02-16 DIAGNOSIS — F3132 Bipolar disorder, current episode depressed, moderate: Secondary | ICD-10-CM

## 2023-02-16 DIAGNOSIS — Z0000 Encounter for general adult medical examination without abnormal findings: Secondary | ICD-10-CM | POA: Diagnosis not present

## 2023-02-16 DIAGNOSIS — Z0001 Encounter for general adult medical examination with abnormal findings: Secondary | ICD-10-CM | POA: Insufficient documentation

## 2023-02-16 DIAGNOSIS — E782 Mixed hyperlipidemia: Secondary | ICD-10-CM | POA: Diagnosis not present

## 2023-02-16 DIAGNOSIS — M19172 Post-traumatic osteoarthritis, left ankle and foot: Secondary | ICD-10-CM | POA: Insufficient documentation

## 2023-02-16 DIAGNOSIS — Z1329 Encounter for screening for other suspected endocrine disorder: Secondary | ICD-10-CM

## 2023-02-16 DIAGNOSIS — F17200 Nicotine dependence, unspecified, uncomplicated: Secondary | ICD-10-CM | POA: Diagnosis not present

## 2023-02-16 DIAGNOSIS — R519 Headache, unspecified: Secondary | ICD-10-CM | POA: Diagnosis not present

## 2023-02-16 MED ORDER — PROPRANOLOL HCL ER 80 MG PO CP24
80.0000 mg | ORAL_CAPSULE | Freq: Every day | ORAL | 3 refills | Status: DC
Start: 2023-02-16 — End: 2024-02-17

## 2023-02-16 NOTE — Assessment & Plan Note (Signed)
They are recovering well from foot surgery performed on January 13, 2023, with good healing observed and an expectation to resume walking by mid-December. Podiatry is managing their care, and they will continue to follow up with podiatry as scheduled.

## 2023-02-16 NOTE — Assessment & Plan Note (Signed)
They remain stable on Lamictal and use Valium as needed for anxiety. They have regular follow-ups with Psychiatry at [facility] every 2-3 months and will continue their current medications and psychiatric follow-ups.

## 2023-02-16 NOTE — Progress Notes (Signed)
Joel Dicker, NP-C Phone: [phone]  Joel Carr is a 37 y.o. male who presents today to establish care.   Discussed the use of AI scribe software for clinical note transcription with the patient, who gave verbal consent to proceed.  History of Present Illness   The patient, with a history of bipolar disorder and high cholesterol, presents for a new patient consultation. They underwent foot surgery on October 17th due to post-traumatic arthritis from a previous injury in 2022, which shattered all the bones in their foot. The patient reports that the bones seem to be healing well and they are hopeful to be walking by mid-December. They are currently being followed closely by podiatry and are on amitriptyline and gabapentin for pain management.  The patient's bipolar disorder is managed with Lamictal and Valium as needed for anxiety. They are followed by a psychiatrist every two to three months and report good control of their symptoms with the current regimen. They deny any thoughts of self-harm or harm to others. PHQ- 4 and GAD- 5 today.  Previously, the patient was on Lipitor for high cholesterol, but they discontinued the medication after noticing weight gain. They report that they were able to lose weight and lower their cholesterol levels through dietary changes.  The patient admits to smoking approximately five cigarettes a day, but has quit in the past for up to three years. They deny any respiratory symptoms such as shortness of breath, coughing, or wheezing, and do not currently use any inhalers.  The patient also reports experiencing headaches two to three times a week, which they describe as sometimes progressing to migraines. These headaches are associated with light and sound sensitivity and are typically relieved by rest and sleep in a dark room. They have previously been on Imitrex and Verapamil, which they report was effective in reducing the frequency of their headaches.  They  have received their flu shot and tetanus vaccine, as well as the first two doses of the COVID-19 vaccine. They report a well rounded diet, snacking mostly on apples. They report occasional alcohol consumption, limited to one beer on the weekends, and deny any illicit drug use. They have not seen a dentist or eye doctor recently. The patient also reports insomnia, for which they take Trazodone as needed, prescribed by their psychiatrist.      Active Ambulatory Problems    Diagnosis Date Noted   Bipolar disorder (HCC) 01/01/2015   Mixed hyperlipidemia 04/10/2015   RLS (restless legs syndrome) 02/02/2017   Nodule of skin of abdomen 02/08/2018   GERD (gastroesophageal reflux disease) 07/07/2018   Insomnia 07/07/2018   Anxiety 10/23/2018   Tension headache 05/08/2019   DOE (dyspnea on exertion) 01/23/2020   History of asthma 01/23/2020   Current every day smoker 04/29/2020   Other emphysema (HCC) 04/29/2020   Migraine 12/30/2020   Chronic foot pain (Left) 02/27/2021   Chronic pain syndrome 05/11/2021   Disorder of skeletal system 05/11/2021   Problems influencing health status 05/11/2021   Chronic nonintractable headache 08/12/2021   Headache disorder 02/16/2023   Encounter for routine adult medical exam with abnormal findings 02/16/2023   Post-traumatic arthritis of left foot 02/16/2023   Resolved Ambulatory Problems    Diagnosis Date Noted   Biceps tendinitis 10/10/2017   Impingement syndrome of shoulder region 10/10/2017   Flank pain 05/08/2019   Finger pain, left 07/17/2019   Acute non-recurrent maxillary sinusitis 10/04/2019   History of COVID-19 01/23/2020   Abnormal CXR 01/23/2020  Body aches after vaccination 02/12/2020   Former smoker 04/29/2020   Bipolar 1 disorder (HCC) 12/30/2020   Need for influenza vaccination 01/30/2021   Hyperlipidemia 03/13/2021   Pharmacologic therapy 05/11/2021   Past Medical History:  Diagnosis Date   COVID-19 virus infection 12/23/2019    Depression    History of kidney stones    History of migraine    Pneumonia     Family History  Problem Relation Age of Onset   Depression Father    Alcohol abuse Father    Diabetes Paternal Grandfather    Heart attack Paternal Grandfather    Colon cancer Neg Hx     Social History   Socioeconomic History   Marital status: Married    Spouse name: Joel Carr   Number of children: 2   Years of education: 10-   Highest education level: GED or equivalent  Occupational History   Not on file  Tobacco Use   Smoking status: Every Day    Types: Cigars, E-cigarettes   Smokeless tobacco: Never   Tobacco comments:    3 cig daily-04/29/2020  Vaping Use   Vaping status: Every Day   Substances: Nicotine, Flavoring  Substance and Sexual Activity   Alcohol use: Not Currently    Comment: rarely   Drug use: No   Sexual activity: Yes  Other Topics Concern   Not on file  Social History Narrative   Married with 2 kids. 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 Strain: Medium Risk (02/14/2023)   Overall Financial Resource Strain (CARDIA)    Difficulty of Paying Living Expenses: Somewhat hard  Food Insecurity: Food Insecurity Present (02/14/2023)   Hunger Vital Sign    Worried About Running Out of Food in the Last Year: Sometimes true    Ran Out of Food in the Last Year: Sometimes true  Transportation Needs: No Transportation Needs (02/14/2023)   PRAPARE - Administrator, Civil Service (Medical): No    Lack of Transportation (Non-Medical): No  Physical Activity: Sufficiently Active (02/14/2023)   Exercise Vital Sign    Days of Exercise per Week: 7 days    Minutes of Exercise per Session: 30 min  Stress: No Stress Concern Present (02/14/2023)   [employer] of Occupational Health - Occupational Stress Questionnaire    Feeling of Stress : Only a little  Social Connections: Moderately  Isolated (02/14/2023)   Social Connection and Isolation Panel [NHANES]    Frequency of Communication with Friends and Family: More than three times a week    Frequency of Social Gatherings with Friends and Family: Three times a week    Attends Religious Services: Never    Active Member of Clubs or Organizations: No    Attends Banker Meetings: Not on file    Marital Status: Married  Intimate Partner Violence: Unknown (01/31/2017)   Humiliation, Afraid, Rape, and Kick questionnaire    Fear of Current or Ex-Partner: Patient declined    Emotionally Abused: Patient declined    Physically Abused: Patient declined    Sexually Abused: Patient declined    ROS  General:  Negative for unexplained weight loss, fever Skin: Negative for new or changing mole, sore that won't heal HEENT: Negative for trouble hearing, trouble seeing, ringing in ears, mouth sores, hoarseness, change in voice, dysphagia. CV:  Negative for chest pain, dyspnea, edema, palpitations Resp: Negative for cough, dyspnea, hemoptysis GI:  Negative for nausea, vomiting, diarrhea, constipation, abdominal pain, melena, hematochezia. GU: Negative for dysuria, incontinence, urinary hesitance, hematuria, vaginal or penile discharge, polyuria, sexual difficulty, lumps in testicle or breasts MSK: Negative for muscle cramps or aches, joint pain or swelling Neuro: Negative for weakness, numbness, dizziness, passing out/fainting Psych: Negative for depression, anxiety, memory problems  Objective  Physical Exam Vitals:   02/16/23 1407  BP: 124/78  Pulse: 88  Temp: 98.2 F (36.8 C)  SpO2: 97%    BP Readings from Last 3 Encounters:  02/16/23 124/78  01/13/23 116/83  06/29/22 (!) 128/96   Wt Readings from Last 3 Encounters:  02/16/23 145 lb (65.8 kg)  01/13/23 136 lb 12.8 oz (62.1 kg)  06/29/22 147 lb 14.9 oz (67.1 kg)    Physical Exam Constitutional:      General: He is not in acute distress.    Appearance:  Normal appearance.  HENT:     Head: Normocephalic.     Right Ear: Tympanic membrane normal.     Left Ear: Tympanic membrane normal.     Nose: Nose normal.     Mouth/Throat:     Mouth: Mucous membranes are moist.     Pharynx: Oropharynx is clear.  Eyes:     Conjunctiva/sclera: Conjunctivae normal.     Pupils: Pupils are equal, round, and reactive to light.  Neck:     Thyroid: No thyromegaly.  Cardiovascular:     Rate and Rhythm: Normal rate and regular rhythm.     Heart sounds: Normal heart sounds.  Pulmonary:     Effort: Pulmonary effort is normal.     Breath sounds: Normal breath sounds.  Abdominal:     General: Abdomen is flat. Bowel sounds are normal.     Palpations: Abdomen is soft. There is no mass.     Tenderness: There is no abdominal tenderness.  Musculoskeletal:        General: Normal range of motion.     Comments: Below the knee cast present on left leg   Lymphadenopathy:     Cervical: No cervical adenopathy.  Skin:    General: Skin is warm and dry.     Findings: No rash.  Neurological:     General: No focal deficit present.     Mental Status: He is alert.  Psychiatric:        Mood and Affect: Mood normal.        Behavior: Behavior normal.    Assessment/Plan:   Encounter for routine adult medical exam with abnormal findings Assessment & Plan: Physical exam complete. He will return to complete routine lab work as outlined when fasting. They received flu and tetanus vaccines today and have received two doses of the Moderna COVID-19 vaccine in 2022. He declines any additional COVID vaccines. They will be encouraged to have regular dental and eye exams. Encouraged to continue healthy diet. Return to care in one year, sooner as needed.   Orders: -     CBC with Differential/Platelet; Future -     Comprehensive metabolic panel; Future  Headache disorder Assessment & Plan: They experience headaches 2-3 times per week, some with migrainous features. They were  previously on Verapamil which was effective but are not currently on any prophylactic treatment. We will start the patient on Propranolol ER 80 mg daily for prophylaxis. Return precautions given to patient. He will contact if he continues to have frequent headaches or if they are worsening/changing in presentation.   Orders: -  Propranolol HCl ER; Take 1 capsule (80 mg total) by mouth daily.  Dispense: 90 capsule; Refill: 3  Bipolar affective disorder, currently depressed, moderate (HCC) Assessment & Plan: They remain stable on Lamictal and use Valium as needed for anxiety. They have regular follow-ups with Psychiatry at [facility] every 2-3 months and will continue their current medications and psychiatric follow-ups.   Post-traumatic arthritis of left foot Assessment & Plan: They are recovering well from foot surgery performed on January 13, 2023, with good healing observed and an expectation to resume walking by mid-December. Podiatry is managing their care, and they will continue to follow up with podiatry as scheduled.   Mixed hyperlipidemia Assessment & Plan: They have a history of high cholesterol and were previously on atorvastatin 40mg  but are currently not on medication for it due to recent lifestyle changes that improved their cholesterol levels. A fasting lipid panel will be ordered to assess their current cholesterol levels. We will consider restarting Atorvastatin pending his lipid panel results.   Orders: -     Lipid panel; Future  Current every day smoker Assessment & Plan: They are a current smoker, averaging 5 cigarettes per day, with a history of quitting for 3 years in the past. They will be encouraged to pursue smoking cessation and set a quit date. Counseling provided.   Need for tetanus, diphtheria, and acellular pertussis (Tdap) vaccine -     Tdap vaccine greater than or equal to 7yo IM  Need for influenza vaccination -     Flu vaccine trivalent PF, 6mos  and older(Flulaval,Afluria,Fluarix,Fluzone)  Thyroid disorder screen -     TSH; Future   Return in about 1 year (around 02/16/2024) for Annual Exam, sooner PRN.   Joel Dicker, NP-C [facility] - [employer]

## 2023-02-16 NOTE — Assessment & Plan Note (Signed)
They experience headaches 2-3 times per week, some with migrainous features. They were previously on Verapamil which was effective but are not currently on any prophylactic treatment. We will start the patient on Propranolol ER 80 mg daily for prophylaxis. Return precautions given to patient. He will contact if he continues to have frequent headaches or if they are worsening/changing in presentation.

## 2023-02-16 NOTE — Assessment & Plan Note (Signed)
Physical exam complete. He will return to complete routine lab work as outlined when fasting. They received flu and tetanus vaccines today and have received two doses of the Moderna COVID-19 vaccine in 2022. He declines any additional COVID vaccines. They will be encouraged to have regular dental and eye exams. Encouraged to continue healthy diet. Return to care in one year, sooner as needed.

## 2023-02-16 NOTE — Assessment & Plan Note (Signed)
They have a history of high cholesterol and were previously on atorvastatin 40mg  but are currently not on medication for it due to recent lifestyle changes that improved their cholesterol levels. A fasting lipid panel will be ordered to assess their current cholesterol levels. We will consider restarting Atorvastatin pending his lipid panel results.

## 2023-02-16 NOTE — Assessment & Plan Note (Signed)
They are a current smoker, averaging 5 cigarettes per day, with a history of quitting for 3 years in the past. They will be encouraged to pursue smoking cessation and set a quit date. Counseling provided.

## 2023-02-17 ENCOUNTER — Other Ambulatory Visit: Payer: 59

## 2023-02-17 DIAGNOSIS — Z0000 Encounter for general adult medical examination without abnormal findings: Secondary | ICD-10-CM | POA: Diagnosis not present

## 2023-02-17 DIAGNOSIS — E782 Mixed hyperlipidemia: Secondary | ICD-10-CM | POA: Diagnosis not present

## 2023-02-17 DIAGNOSIS — Z1329 Encounter for screening for other suspected endocrine disorder: Secondary | ICD-10-CM | POA: Diagnosis not present

## 2023-02-17 DIAGNOSIS — Z0001 Encounter for general adult medical examination with abnormal findings: Secondary | ICD-10-CM

## 2023-02-17 LAB — CBC WITH DIFFERENTIAL/PLATELET
Basophils Absolute: 0 10*3/uL (ref 0.0–0.1)
Basophils Relative: 0.3 % (ref 0.0–3.0)
Eosinophils Absolute: 0.2 10*3/uL (ref 0.0–0.7)
Eosinophils Relative: 2.7 % (ref 0.0–5.0)
HCT: 44 % (ref 39.0–52.0)
Hemoglobin: 14.8 g/dL (ref 13.0–17.0)
Lymphocytes Relative: 36.6 % (ref 12.0–46.0)
Lymphs Abs: 2.3 10*3/uL (ref 0.7–4.0)
MCHC: 33.6 g/dL (ref 30.0–36.0)
MCV: 92.3 fL (ref 78.0–100.0)
Monocytes Absolute: 0.6 10*3/uL (ref 0.1–1.0)
Monocytes Relative: 9.3 % (ref 3.0–12.0)
Neutro Abs: 3.2 10*3/uL (ref 1.4–7.7)
Neutrophils Relative %: 51.1 % (ref 43.0–77.0)
Platelets: 199 10*3/uL (ref 150.0–400.0)
RBC: 4.76 Mil/uL (ref 4.22–5.81)
RDW: 13.3 % (ref 11.5–15.5)
WBC: 6.2 10*3/uL (ref 4.0–10.5)

## 2023-02-17 LAB — COMPREHENSIVE METABOLIC PANEL
ALT: 11 U/L (ref 0–53)
AST: 13 U/L (ref 0–37)
Albumin: 4.5 g/dL (ref 3.5–5.2)
Alkaline Phosphatase: 65 U/L (ref 39–117)
BUN: 11 mg/dL (ref 6–23)
CO2: 30 meq/L (ref 19–32)
Calcium: 9.5 mg/dL (ref 8.4–10.5)
Chloride: 103 meq/L (ref 96–112)
Creatinine, Ser: 0.77 mg/dL (ref 0.40–1.50)
GFR: 113.54 mL/min (ref >60.00)
Glucose, Bld: 90 mg/dL (ref 70–99)
Potassium: 4.1 meq/L (ref 3.5–5.1)
Sodium: 140 meq/L (ref 135–145)
Total Bilirubin: 0.9 mg/dL (ref 0.2–1.2)
Total Protein: 6.2 g/dL (ref 6.0–8.3)

## 2023-02-17 LAB — LIPID PANEL
Cholesterol: 230 mg/dL — ABNORMAL HIGH (ref 0–200)
HDL: 42.3 mg/dL (ref >39.00)
LDL Cholesterol: 138 mg/dL — ABNORMAL HIGH (ref 0–99)
NonHDL: 188.06
Total CHOL/HDL Ratio: 5
Triglycerides: 250 mg/dL — ABNORMAL HIGH (ref 0.0–149.0)
VLDL: 50 mg/dL — ABNORMAL HIGH (ref 0.0–40.0)

## 2023-02-17 LAB — TSH: TSH: 1.29 u[IU]/mL (ref 0.35–5.50)

## 2023-02-18 ENCOUNTER — Encounter: Payer: Self-pay | Admitting: Nurse Practitioner

## 2023-02-23 ENCOUNTER — Ambulatory Visit: Payer: 59 | Admitting: Nurse Practitioner

## 2023-02-27 ENCOUNTER — Encounter: Payer: Self-pay | Admitting: Nurse Practitioner

## 2023-03-01 ENCOUNTER — Other Ambulatory Visit: Payer: Self-pay | Admitting: Nurse Practitioner

## 2023-03-01 DIAGNOSIS — G43809 Other migraine, not intractable, without status migrainosus: Secondary | ICD-10-CM

## 2023-03-01 MED ORDER — SUMATRIPTAN SUCCINATE 25 MG PO TABS
ORAL_TABLET | ORAL | 5 refills | Status: DC
Start: 2023-03-01 — End: 2024-02-17

## 2023-03-08 ENCOUNTER — Ambulatory Visit: Payer: 59 | Admitting: Nurse Practitioner

## 2023-04-19 NOTE — Patient Instructions (Incomplete)
______________________________________________________________________    New Patients  Welcome to [facility].   Initial Visit The first or initial visit consists of an evaluation only.   Interventional pain management.  We offer therapies other than opioid controlled substances to manage chronic pain. These include, but are not limited to, diagnostic, therapeutic, and palliative specialized injection therapies (i.e.: Epidural Steroids, Facet Blocks, etc.). We specialize in a variety of nerve blocks as well as radiofrequency treatments. We offer pain implant evaluations and trials, as well as follow up management. In addition we also provide a variety joint injections, including Viscosupplementation (AKA: Gel Therapy).  Prescription Pain Medication. We specialize in alternatives to opioids. We can provide evaluations and recommendations for/of pharmacologic therapies based on CDC Guidelines.  We no longer take patients for long-term medication management. We will not be taking over your pain medications.  ______________________________________________________________________      ______________________________________________________________________    Patient Information update  To: All of our patients.  Re: Name change.  It has been made official that our current name, "[facility]"   will soon be changed to "[facility]".   The purpose of this change is to eliminate any confusion created by the concept of our practice being a "Medication Management Pain Clinic". In the past this has led to the misconception that we treat pain primarily by the use of prescription medications.  Nothing can be farther from the truth.   Understanding PAIN MANAGEMENT: To further understand what our practice does, you first have to  understand that "Pain Management" is a subspecialty that requires additional training once a physician has completed their specialty training, which can be in either Anesthesia, Neurology, Psychiatry, or Physical Medicine and Rehabilitation (PMR). Each one of these contributes to the final approach taken by each physician to the management of their patient's pain. To be a "Pain Management Specialist" you must have first completed one of the specialty trainings below.  Anesthesiologists - trained in clinical pharmacology and interventional techniques such as nerve blockade and regional as well as central neuroanatomy. They are trained to block pain before, during, and after surgical interventions.  Neurologists - trained in the diagnosis and pharmacological treatment of complex neurological conditions, such as Multiple Sclerosis, Parkinson's, spinal cord injuries, and other systemic conditions that may be associated with symptoms that may include but are not limited to pain. They tend to rely primarily on the treatment of chronic pain using prescription medications.  Psychiatrist - trained in conditions affecting the psychosocial wellbeing of patients including but not limited to depression, anxiety, schizophrenia, personality disorders, addiction, and other substance use disorders that may be associated with chronic pain. They tend to rely primarily on the treatment of chronic pain using prescription medications.   Physical Medicine and Rehabilitation (PMR) physicians, also known as physiatrists - trained to treat a wide variety of medical conditions affecting the brain, spinal cord, nerves, bones, joints, ligaments, muscles, and tendons. Their training is primarily aimed at treating patients that have suffered injuries that have caused severe physical impairment. Their training is primarily aimed at the physical therapy and rehabilitation of those patients. They may also work alongside orthopedic surgeons  or neurosurgeons using their expertise in assisting surgical patients to recover after their surgeries.  INTERVENTIONAL PAIN MANAGEMENT is sub-subspecialty of Pain Management.  Our physicians are Board-certified in Anesthesia, Pain Management, and Interventional Pain Management.  This meaning that  not only have they been trained and Board-certified in their specialty of Anesthesia, and subspecialty of Pain Management, but they have also received further training in the sub-subspecialty of Interventional Pain Management, in order to become Board-certified as INTERVENTIONAL PAIN MANAGEMENT SPECIALIST.    Mission: Our goal is to use our skills in  INTERVENTIONAL PAIN MANAGEMENT as alternatives to the chronic use of prescription opioid medications for the treatment of pain. To make this more clear, we have changed our name to reflect what we do and offer. We will continue to offer medication management assessment and recommendations, but we will not be taking over any patient's medication management.  ______________________________________________________________________
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
## 2023-04-20 ENCOUNTER — Ambulatory Visit: Payer: 59 | Attending: Pain Medicine | Admitting: Pain Medicine

## 2023-04-20 ENCOUNTER — Encounter: Payer: Self-pay | Admitting: Pain Medicine

## 2023-04-20 VITALS — BP 145/100 | HR 106 | Temp 99.5°F | Ht 68.0 in | Wt 148.0 lb

## 2023-04-20 DIAGNOSIS — Z789 Other specified health status: Secondary | ICD-10-CM | POA: Insufficient documentation

## 2023-04-20 DIAGNOSIS — M899 Disorder of bone, unspecified: Secondary | ICD-10-CM | POA: Diagnosis present

## 2023-04-20 DIAGNOSIS — M19172 Post-traumatic osteoarthritis, left ankle and foot: Secondary | ICD-10-CM | POA: Insufficient documentation

## 2023-04-20 DIAGNOSIS — G8929 Other chronic pain: Secondary | ICD-10-CM | POA: Insufficient documentation

## 2023-04-20 DIAGNOSIS — Z79899 Other long term (current) drug therapy: Secondary | ICD-10-CM | POA: Insufficient documentation

## 2023-04-20 DIAGNOSIS — M79672 Pain in left foot: Secondary | ICD-10-CM | POA: Diagnosis present

## 2023-04-20 DIAGNOSIS — G894 Chronic pain syndrome: Secondary | ICD-10-CM | POA: Diagnosis present

## 2023-04-20 MED ORDER — PREDNISONE 20 MG PO TABS
ORAL_TABLET | ORAL | 0 refills | Status: AC
Start: 2023-04-20 — End: 2023-04-29

## 2023-04-20 NOTE — Progress Notes (Signed)
Safety precautions to be maintained throughout the outpatient stay will include: orient to surroundings, keep bed in low position, maintain call bell within reach at all times, provide assistance with transfer out of bed and ambulation.  

## 2023-04-27 ENCOUNTER — Encounter: Payer: Self-pay | Admitting: Pain Medicine

## 2023-05-03 NOTE — Progress Notes (Signed)
(  05/04/2023) NO-SHOW to requested appointment.  (Patient did not call to cancel.)

## 2023-05-04 ENCOUNTER — Ambulatory Visit (HOSPITAL_BASED_OUTPATIENT_CLINIC_OR_DEPARTMENT_OTHER): Payer: 59 | Admitting: Pain Medicine

## 2023-05-04 DIAGNOSIS — G8929 Other chronic pain: Secondary | ICD-10-CM

## 2023-05-04 DIAGNOSIS — Z91199 Patient's noncompliance with other medical treatment and regimen due to unspecified reason: Secondary | ICD-10-CM

## 2023-05-04 DIAGNOSIS — M19172 Post-traumatic osteoarthritis, left ankle and foot: Secondary | ICD-10-CM

## 2023-05-04 DIAGNOSIS — G894 Chronic pain syndrome: Secondary | ICD-10-CM

## 2023-05-11 ENCOUNTER — Encounter: Payer: Self-pay | Admitting: Pain Medicine

## 2023-05-11 ENCOUNTER — Ambulatory Visit: Payer: 59 | Attending: Pain Medicine | Admitting: Pain Medicine

## 2023-05-11 VITALS — BP 138/87 | HR 79 | Temp 98.2°F | Resp 16 | Ht 68.0 in | Wt 147.0 lb

## 2023-05-11 DIAGNOSIS — M19172 Post-traumatic osteoarthritis, left ankle and foot: Secondary | ICD-10-CM | POA: Diagnosis present

## 2023-05-11 DIAGNOSIS — S92902S Unspecified fracture of left foot, sequela: Secondary | ICD-10-CM | POA: Insufficient documentation

## 2023-05-11 DIAGNOSIS — G8929 Other chronic pain: Secondary | ICD-10-CM | POA: Insufficient documentation

## 2023-05-11 DIAGNOSIS — G894 Chronic pain syndrome: Secondary | ICD-10-CM | POA: Diagnosis present

## 2023-05-11 DIAGNOSIS — M79672 Pain in left foot: Secondary | ICD-10-CM | POA: Insufficient documentation

## 2023-05-11 NOTE — Progress Notes (Signed)
PROVIDER NOTE: Information contained herein reflects review and annotations entered in association with encounter. Interpretation of such information and data should be left to medically-trained personnel. Information provided to patient can be located elsewhere in the medical record under "Patient Instructions". Document created using STT-dictation technology, any transcriptional errors that may result from process are unintentional.    Patient: Joel Carr  Service Category: E/M  Provider: Oswaldo Done, MD  DOB: 1985-11-06  DOS: 05/11/2023  Referring Provider: Bethanie Dicker, NP  MRN: [id number]  Specialty: Interventional Pain Management  PCP: Bethanie Dicker, NP  Type: Established Patient  Setting: Ambulatory outpatient    Location: Office  Delivery: Face-to-face     HPI  Mr. Joel Carr, a 38 y.o. year old male, is here today because of his Chronic foot pain, left [M79.672, G89.29]. Joel Carr primary complain today is Foot Pain (Left foot outside and top)  Pertinent problems: Mr. Joel Carr has RLS (restless legs syndrome); Tension headache; Migraine; Chronic foot pain (1ry area of Pain) (Left); Chronic pain syndrome; Chronic nonintractable headache; Headache disorder; Post-traumatic arthritis of foot (Left); and Closed fracture of bone of foot, sequela (Left) on their pertinent problem list. Pain Assessment: Severity of Chronic pain is reported as a 6 /10. Location: Foot Left/denies. Onset: More than a month ago. Quality: Constant, Sharp, Stabbing. Timing: Constant. Modifying factor(s): denies. Vitals:  height is 5\' 8"  (1.727 m) and weight is 147 lb (66.7 kg). His temperature is 98.2 F (36.8 C). His blood pressure is 138/87 and his pulse is 79. His respiration is 16 and oxygen saturation is 99%.  BMI: Estimated body mass index is 22.35 kg/m as calculated from the following:   Height as of this encounter: 5\' 8"  (1.727 m).   Weight as of this encounter: 147 lb (66.7 kg). Last encounter:  05/04/2023. Last procedure: Visit date not found.  Reason for encounter: patient-requested evaluation.  The patient had previously requested an appointment for evaluation.  He was scheduled to come in on 05/04/2023 but he did not show to that appointment and he also did not call to cancel.  The patient was last seen on 04/20/2023 at which time he was provided with a prescription for a prednisone taper.  He was also recommended to do a Ceasing cream versus Arnica cream trial.  At that time he was also undergoing physical therapy and he was experiencing flareups of the pain after the therapy for which he was provided with information regarding Preemptive Analgesia to use technique to avoid those flareups.  Other than that, we had considered the possibility of a left-sided lumbar spinal cord stimulator trial as a possible way to provide some long-term management of this pain.  A CT of the left foot done on 11/15/2022 revealed: IMPRESSION: 1. Solid arthrodesis across the 1st, 2nd and 3rd tarsometatarsal joints status post ORIF. 2. Interval healing of previously demonstrated fracture dislocation at the 4th and 5th tarsometatarsal joints with associated posttraumatic osteoarthritis. 3. No acute osseous findings. 4. No evidence of periarticular fluid collection, unexpected foreign body or soft tissue emphysema.  Discussed the use of AI scribe software for clinical note transcription with the patient, who gave verbal consent to proceed.  History of Present Illness   Joel Carr is a 38 year old male with posttraumatic osteoarthritis who presents with persistent foot pain.  He has experienced persistent foot pain since a significant injury and subsequent surgery, which involved a fracture and dislocation of at least two bones in his  foot, with additional issues involving three more bones. This led to a diagnosis of posttraumatic osteoarthritis.  The pain is described as being related to mechanical or nerve  problems, as indicated by previous medical evaluations. Over-the-counter medications like ibuprofen and arthritis eight-hour pills have been ineffective in alleviating the pain. A previous course of oral steroids provided only partial relief, suggesting that while swelling was reduced, mechanical issues remain.  The pain significantly impacts his daily life, as he is currently on short-term disability from work, indicating a substantial effect on his ability to perform job duties.       Pharmacotherapy Assessment  Analgesic: No chronic opioid analgesics therapy prescribed by our practice. None MME/day: 0 mg/day   Monitoring: [place] PMP: PDMP reviewed during this encounter.       Pharmacotherapy: No side-effects or adverse reactions reported. Compliance: No problems identified. Effectiveness: Clinically acceptable.  Valerie Salts, RN  05/11/2023  1:41 PM  Sign when Signing Visit Safety precautions to be maintained throughout the outpatient stay will include: orient to surroundings, keep bed in low position, maintain call bell within reach at all times, provide assistance with transfer out of bed and ambulation.    No results found for: "CBDTHCR" No results found for: "D8THCCBX" No results found for: "D9THCCBX"  UDS:  Summary  Date Value Ref Range Status  05/11/2021 Note  Final    Comment:    ==================================================================== Compliance Drug Analysis, Ur ==================================================================== Test                             Result       Flag       Units  Drug Present and Declared for Prescription Verification   Desmethyldiazepam              225          EXPECTED   ng/mg creat   Oxazepam                       1367         EXPECTED   ng/mg creat   Temazepam                      762          EXPECTED   ng/mg creat    Desmethyldiazepam, oxazepam, and temazepam are expected metabolites    of diazepam. Desmethyldiazepam and  oxazepam are also expected    metabolites of other drugs, including chlordiazepoxide, prazepam,    clorazepate, and halazepam. Oxazepam is an expected metabolite of    temazepam. Oxazepam and temazepam are also available as scheduled    prescription medications.    Gabapentin                     PRESENT      EXPECTED   Lamotrigine                    PRESENT      EXPECTED   Lurasidone                     PRESENT      EXPECTED   Acetaminophen                  PRESENT      EXPECTED   Verapamil  PRESENT      EXPECTED  Drug Present not Declared for Prescription Verification   Carboxy-THC                    113          UNEXPECTED ng/mg creat    Carboxy-THC is a metabolite of tetrahydrocannabinol (THC). Source of    THC is most commonly herbal marijuana or marijuana-based products,    but THC is also present in a scheduled prescription medication.    Trace amounts of THC can be present in hemp and cannabidiol (CBD)    products. This test is not intended to distinguish between delta-9-    tetrahydrocannabinol, the predominant form of THC in most herbal or    marijuana-based products, and delta-8-tetrahydrocannabinol.  Drug Absent but Declared for Prescription Verification   Quetiapine                     Not Detected UNEXPECTED   Diclofenac                     Not Detected UNEXPECTED    Diclofenac, as indicated in the declared medication list, is not    always detected even when used as directed.  ==================================================================== Test                      Result    Flag   Units      Ref Range   Creatinine              63               mg/dL      >=78 ==================================================================== Declared Medications:  The flagging and interpretation on this report are based on the  following declared medications.  Unexpected results may arise from  inaccuracies in the declared medications.   **Note: The  testing scope of this panel includes these medications:   Diazepam (Valium)  Gabapentin (Neurontin)  Lamotrigine (Lamictal)  Lurasidone (Latuda)  Quetiapine (Seroquel)  Verapamil (Calan)   **Note: The testing scope of this panel does not include small to  moderate amounts of these reported medications:   Acetaminophen (Tylenol)  Diclofenac (Voltaren)   **Note: The testing scope of this panel does not include the  following reported medications:   Atorvastatin (Lipitor)  Fenofibrate (TriCor)  Multivitamin  Prednisone (Deltasone)  Sumatriptan (Imitrex) ==================================================================== For clinical consultation, please call [phone]. ====================================================================       ROS  Constitutional: Denies any fever or chills Gastrointestinal: No reported hemesis, hematochezia, vomiting, or acute GI distress Musculoskeletal: Denies any acute onset joint swelling, redness, loss of ROM, or weakness Neurological: No reported episodes of acute onset apraxia, aphasia, dysarthria, agnosia, amnesia, paralysis, loss of coordination, or loss of consciousness  Medication Review  SUMAtriptan, amitriptyline, diazepam, gabapentin, lamoTRIgine, propranolol ER, and traZODone  History Review  Allergy: Mr. Winchell is allergic to tramadol. Drug: Mr. Rockers  reports no history of drug use. Alcohol:  reports that he does not currently use alcohol. Tobacco:  reports that he has been smoking cigars and e-cigarettes. He has never used smokeless tobacco. Social: Mr. Boutelle  reports that he has been smoking cigars and e-cigarettes. He has never used smokeless tobacco. He reports that he does not currently use alcohol. He reports that he does not use drugs. Medical:  has a past medical history of Anxiety, Bipolar disorder (HCC), Chronic pain syndrome, COVID-19 virus infection (12/23/2019),  Depression, History of kidney stones, History  of migraine, Hyperlipidemia, Pneumonia, and RLS (restless legs syndrome). Surgical: Mr. Genther  has a past surgical history that includes Testicle surgery (N/A); Knee arthroscopy with meniscal repair (Right, 10/29/2014); Tympanoplasty (Bilateral, 1990); Arthrodesis metatarsal (Left, 09/19/2020); Open reduction internal fixation (orif) foot lisfranc fracture (Left, 09/19/2020); Foot arthrodesis (Left, 09/19/2020); Arthrodesis metatarsal (Left, 01/13/2023); and Foot arthrodesis (Left, 01/13/2023). Family: family history includes Alcohol abuse in his father; Depression in his father; Diabetes in his paternal grandfather; Heart attack in his paternal grandfather.  Laboratory Chemistry Profile   Renal Lab Results  Component Value Date   BUN 11 02/17/2023   CREATININE 0.77 02/17/2023   BCR 12 06/12/2021   GFR 113.54 02/17/2023   GFRAA 131 08/08/2019   GFRNONAA >60 08/20/2020    Hepatic Lab Results  Component Value Date   AST 13 02/17/2023   ALT 11 02/17/2023   ALBUMIN 4.5 02/17/2023   ALKPHOS 65 02/17/2023   LIPASE 26 09/02/2016    Electrolytes Lab Results  Component Value Date   NA 140 02/17/2023   K 4.1 02/17/2023   CL 103 02/17/2023   CALCIUM 9.5 02/17/2023   MG 2.0 05/11/2021    Bone Lab Results  Component Value Date   25OHVITD1 35 05/11/2021   25OHVITD2 <1.0 05/11/2021   25OHVITD3 35 05/11/2021    Inflammation (CRP: Acute Phase) (ESR: Chronic Phase) Lab Results  Component Value Date   CRP <1 05/11/2021   ESRSEDRATE 2 05/11/2021         Note: Above Lab results reviewed.  Recent Imaging Review  DG MINI C-ARM IMAGE ONLY There is no interpretation for this exam.    This order is for images obtained during a surgical procedure.  Please See  "Surgeries" Tab for more information regarding the procedure. Note: Reviewed        Physical Exam  General appearance: Well nourished, well developed, and well hydrated. In no apparent acute distress Mental status: Alert, oriented  x 3 (person, place, & time)       Respiratory: No evidence of acute respiratory distress Eyes: PERLA Vitals: BP 138/87   Pulse 79   Temp 98.2 F (36.8 C)   Resp 16   Ht 5\' 8"  (1.727 m)   Wt 147 lb (66.7 kg)   SpO2 99%   BMI 22.35 kg/m  BMI: Estimated body mass index is 22.35 kg/m as calculated from the following:   Height as of this encounter: 5\' 8"  (1.727 m).   Weight as of this encounter: 147 lb (66.7 kg). Ideal: Ideal body weight: 68.4 kg (150 lb 12.7 oz)  Assessment   Diagnosis Status  1. Chronic foot pain (1ry area of Pain) (Left)   2. Post-traumatic arthritis of foot (Left)   3. Closed fracture of bone of foot, sequela (Left)   4. Chronic pain syndrome    Controlled Controlled Controlled   Updated Problems: Problem  Closed fracture of bone of foot, sequela (Left)  Chronic Nonintractable Headache    Plan of Care  Problem-specific:  Assessment and Plan    Posttraumatic Osteoarthritis Chronic posttraumatic osteoarthritis in the foot persists following severe injury and surgery, causing ongoing pain and mechanical issues. Previous oral steroids offered partial relief, suggesting swelling contributes to the pain, though mechanical problems remain. The limited long-term efficacy of steroid injections was discussed, along with the potential benefits of spinal cord stimulator implants. A trial period for the implant and a medical psychology evaluation to rule out contraindications are  necessary. Provide information on spinal cord stimulator implants and refer for a medical psychology evaluation. If interested after reviewing the information, discuss a trial of the spinal cord stimulator implant.       Mr. HODGES TREIBER has a current medication list which includes the following long-term medication(s): amitriptyline, gabapentin, lamotrigine, propranolol er, and sumatriptan.  Pharmacotherapy (Medications Ordered): No orders of the defined types were placed in this  encounter.  Orders:  Orders Placed This Encounter  Procedures   Ambulatory referral to Psychology    Referral Priority:   Routine    Referral Type:   Psychiatric    Referral Reason:   Specialty Services Required    Requested Specialty:   Psychology    Number of Visits Requested:   1   Follow-up plan:   Return for Eval-day (M,W), (F2F) after he has completed the medical psychology evaluation.      Interventional Therapies  Risk  Complexity Considerations:   WNL   Planned  Pending:   Prednisone taper trial  Capsaicin cream vs Arnica cream trial  Preemptive Analgesia trial (use prior to physical therapy)    Under consideration:   Possible: Left Lumbar spinal cord stimulator trial #1  Diagnostic left ankle block #1  Diagnostic left foot steroid injections   Completed:   None at this time   Therapeutic  Palliative (PRN) options:   None established   Pharmacotherapy:   Recommendations:   Continue with gabapentin 300 mg at bedtime.  Consider increasing as tolerated.     Recent Visits Date Type Provider Dept  04/20/23 Office Visit Delano Metz, MD [facility]  Showing recent visits within past 90 days and meeting all other requirements Today's Visits Date Type Provider Dept  05/11/23 Office Visit Delano Metz, MD [facility]  Showing today's visits and meeting all other requirements Future Appointments No visits were found meeting these conditions. Showing future appointments within next 90 days and meeting all other requirements  I discussed the assessment and treatment plan with the patient. The patient was provided an opportunity to ask questions and all were answered. The patient agreed with the plan and demonstrated an understanding of the instructions.  Patient advised to call back or seek an in-person evaluation if the symptoms or condition worsens.  Duration of encounter: 30 minutes.  Total time on encounter, as per AMA  guidelines included both the face-to-face and non-face-to-face time personally spent by the physician and/or other qualified health care professional(s) on the day of the encounter (includes time in activities that require the physician or other qualified health care professional and does not include time in activities normally performed by clinical staff). Physician's time may include the following activities when performed: Preparing to see the patient (e.g., pre-charting review of records, searching for previously ordered imaging, lab work, and nerve conduction tests) Review of prior analgesic pharmacotherapies. Reviewing PMP Interpreting ordered tests (e.g., lab work, imaging, nerve conduction tests) Performing post-procedure evaluations, including interpretation of diagnostic procedures Obtaining and/or reviewing separately obtained history Performing a medically appropriate examination and/or evaluation Counseling and educating the patient/family/caregiver Ordering medications, tests, or procedures Referring and communicating with other health care professionals (when not separately reported) Documenting clinical information in the electronic or other health record Independently interpreting results (not separately reported) and communicating results to the patient/ family/caregiver Care coordination (not separately reported)  Note by: Oswaldo Done, MD Date: 05/11/2023; Time: 3:46 PM

## 2023-05-11 NOTE — Progress Notes (Signed)
Safety precautions to be maintained throughout the outpatient stay will include: orient to surroundings, keep bed in low position, maintain call bell within reach at all times, provide assistance with transfer out of bed and ambulation.

## 2023-05-13 ENCOUNTER — Telehealth: Payer: 59 | Admitting: Pain Medicine

## 2023-05-17 ENCOUNTER — Encounter: Payer: Self-pay | Admitting: Pain Medicine

## 2023-05-22 NOTE — Progress Notes (Unsigned)
 PROVIDER NOTE: Information contained herein reflects review and annotations entered in association with encounter. Interpretation of such information and data should be left to medically-trained personnel. Information provided to patient can be located elsewhere in the medical record under "Patient Instructions". Document created using STT-dictation technology, any transcriptional errors that may result from process are unintentional.    Patient: Joel Carr  Service Category: E/M  Provider: Oswaldo Done, MD  DOB: [birth year]  DOS: 05/23/2023  Referring Provider: Bethanie Dicker, NP  MRN: [id number]  Specialty: Interventional Pain Management  PCP: Bethanie Dicker, NP  Type: Established Patient  Setting: Ambulatory outpatient    Location: Office  Delivery: Face-to-face     HPI  Mr. Joel Carr, a 38 y.o. year old male, is here today because of his Chronic foot pain, left [M79.672, G89.29]. Mr. Joel Carr primary complain today is No chief complaint on file.  Pertinent problems: Mr. Joel Carr has RLS (restless legs syndrome); Tension headache; Migraine; Chronic foot pain (1ry area of Pain) (Left); Chronic pain syndrome; Chronic nonintractable headache; Headache disorder; Post-traumatic arthritis of foot (Left); and Closed fracture of bone of foot, sequela (Left) on their pertinent problem list. Pain Assessment: Severity of   is reported as a  /10. Location:    / . Onset:  . Quality:  . Timing:  . Modifying factor(s):  Marland Kitchen Vitals:  vitals were not taken for this visit.  BMI: Estimated body mass index is 22.35 kg/m as calculated from the following:   Height as of 05/11/23: 5\' 8"  (1.727 m).   Weight as of 05/11/23: 147 lb (66.7 kg). Last encounter: 05/11/2023. Last procedure: Visit date not found.  Reason for encounter: follow-up evaluation after medical psychology evaluation for possible spinal cord stimulator implant. ***  Discussed the use of AI scribe software for clinical note transcription with the  patient, who gave verbal consent to proceed.  History of Present Illness           Pharmacotherapy Assessment  Analgesic: No chronic opioid analgesics therapy prescribed by our practice. None MME/day: 0 mg/day   Monitoring: [place] PMP: PDMP reviewed during this encounter.       Pharmacotherapy: No side-effects or adverse reactions reported. Compliance: No problems identified. Effectiveness: Clinically acceptable.  No notes on file  No results found for: "CBDTHCR" No results found for: "D8THCCBX" No results found for: "D9THCCBX"  UDS:  Summary  Date Value Ref Range Status  05/11/2021 Note  Final    Comment:    ==================================================================== Compliance Drug Analysis, Ur ==================================================================== Test                             Result       Flag       Units  Drug Present and Declared for Prescription Verification   Desmethyldiazepam              225          EXPECTED   ng/mg creat   Oxazepam                       1367         EXPECTED   ng/mg creat   Temazepam                      762          EXPECTED   ng/mg creat    Desmethyldiazepam, oxazepam,  and temazepam are expected metabolites    of diazepam. Desmethyldiazepam and oxazepam are also expected    metabolites of other drugs, including chlordiazepoxide, prazepam,    clorazepate, and halazepam. Oxazepam is an expected metabolite of    temazepam. Oxazepam and temazepam are also available as scheduled    prescription medications.    Gabapentin                     PRESENT      EXPECTED   Lamotrigine                    PRESENT      EXPECTED   Lurasidone                     PRESENT      EXPECTED   Acetaminophen                  PRESENT      EXPECTED   Verapamil                      PRESENT      EXPECTED  Drug Present not Declared for Prescription Verification   Carboxy-THC                    113          UNEXPECTED ng/mg creat    Carboxy-THC is  a metabolite of tetrahydrocannabinol (THC). Source of    THC is most commonly herbal marijuana or marijuana-based products,    but THC is also present in a scheduled prescription medication.    Trace amounts of THC can be present in hemp and cannabidiol (CBD)    products. This test is not intended to distinguish between delta-9-    tetrahydrocannabinol, the predominant form of THC in most herbal or    marijuana-based products, and delta-8-tetrahydrocannabinol.  Drug Absent but Declared for Prescription Verification   Quetiapine                     Not Detected UNEXPECTED   Diclofenac                     Not Detected UNEXPECTED    Diclofenac, as indicated in the declared medication list, is not    always detected even when used as directed.  ==================================================================== Test                      Result    Flag   Units      Ref Range   Creatinine              63               mg/dL      >=40 ==================================================================== Declared Medications:  The flagging and interpretation on this report are based on the  following declared medications.  Unexpected results may arise from  inaccuracies in the declared medications.   **Note: The testing scope of this panel includes these medications:   Diazepam (Valium)  Gabapentin (Neurontin)  Lamotrigine (Lamictal)  Lurasidone (Latuda)  Quetiapine (Seroquel)  Verapamil (Calan)   **Note: The testing scope of this panel does not include small to  moderate amounts of these reported medications:   Acetaminophen (Tylenol)  Diclofenac (Voltaren)   **Note: The testing scope of this panel does not include the  following reported medications:  Atorvastatin (Lipitor)  Fenofibrate (TriCor)  Multivitamin  Prednisone (Deltasone)  Sumatriptan (Imitrex) ==================================================================== For clinical consultation, please call [phone]. ====================================================================       ROS  Constitutional: Denies any fever or chills Gastrointestinal: No reported hemesis, hematochezia, vomiting, or acute GI distress Musculoskeletal: Denies any acute onset joint swelling, redness, loss of ROM, or weakness Neurological: No reported episodes of acute onset apraxia, aphasia, dysarthria, agnosia, amnesia, paralysis, loss of coordination, or loss of consciousness  Medication Review  SUMAtriptan, amitriptyline, diazepam, gabapentin, lamoTRIgine, propranolol ER, and traZODone  History Review  Allergy: Mr. Joel Carr is allergic to tramadol. Drug: Mr. Joel Carr  reports no history of drug use. Alcohol:  reports that he does not currently use alcohol. Tobacco:  reports that he has been smoking cigars and e-cigarettes. He has never used smokeless tobacco. Social: Mr. Joel Carr  reports that he has been smoking cigars and e-cigarettes. He has never used smokeless tobacco. He reports that he does not currently use alcohol. He reports that he does not use drugs. Medical:  has a past medical history of Anxiety, Bipolar disorder (HCC), Chronic pain syndrome, COVID-19 virus infection (12/23/2019), Depression, History of kidney stones, History of migraine, Hyperlipidemia, Pneumonia, and RLS (restless legs syndrome). Surgical: Mr. Joel Carr  has a past surgical history that includes Testicle surgery (N/A); Knee arthroscopy with meniscal repair (Right, 10/29/2014); Tympanoplasty (Bilateral, 1990); Arthrodesis metatarsal (Left, 09/19/2020); Open reduction internal fixation (orif) foot lisfranc fracture (Left, 09/19/2020); Foot arthrodesis (Left, 09/19/2020); Arthrodesis metatarsal (Left, 01/13/2023); and Foot arthrodesis (Left, 01/13/2023). Family: family history includes Alcohol abuse in his father; Depression in his father; Diabetes in his paternal grandfather; Heart attack in his paternal grandfather.  Laboratory Chemistry  Profile   Renal Lab Results  Component Value Date   BUN 11 02/17/2023   CREATININE 0.77 02/17/2023   BCR 12 06/12/2021   GFR 113.54 02/17/2023   GFRAA 131 08/08/2019   GFRNONAA >60 08/20/2020    Hepatic Lab Results  Component Value Date   AST 13 02/17/2023   ALT 11 02/17/2023   ALBUMIN 4.5 02/17/2023   ALKPHOS 65 02/17/2023   LIPASE 26 09/02/2016    Electrolytes Lab Results  Component Value Date   NA 140 02/17/2023   K 4.1 02/17/2023   CL 103 02/17/2023   CALCIUM 9.5 02/17/2023   MG 2.0 05/11/2021    Bone Lab Results  Component Value Date   25OHVITD1 35 05/11/2021   25OHVITD2 <1.0 05/11/2021   25OHVITD3 35 05/11/2021    Inflammation (CRP: Acute Phase) (ESR: Chronic Phase) Lab Results  Component Value Date   CRP <1 05/11/2021   ESRSEDRATE 2 05/11/2021         Note: Above Lab results reviewed.  Recent Imaging Review  DG MINI C-ARM IMAGE ONLY There is no interpretation for this exam.    This order is for images obtained during a surgical procedure.  Please See  "Surgeries" Tab for more information regarding the procedure. Note: Reviewed        Physical Exam  General appearance: Well nourished, well developed, and well hydrated. In no apparent acute distress Mental status: Alert, oriented x 3 (person, place, & time)       Respiratory: No evidence of acute respiratory distress Eyes: PERLA Vitals: There were no vitals taken for this visit. BMI: Estimated body mass index is 22.35 kg/m as calculated from the following:   Height as of 05/11/23: 5\' 8"  (1.727 m).   Weight as of 05/11/23: 147 lb (66.7 kg). Ideal:  Ideal body weight: 68.4 kg (150 lb 12.7 oz)  Assessment   Diagnosis Status  1. Chronic foot pain (1ry area of Pain) (Left)   2. Closed fracture of bone of foot, sequela (Left)   3. Post-traumatic arthritis of foot (Left)   4. Chronic pain syndrome    Controlled Controlled Controlled   Updated Problems: No problems updated.  Plan of Care   Problem-specific:  Assessment and Plan            Joel Carr has a current medication list which includes the following long-term medication(s): amitriptyline, gabapentin, lamotrigine, propranolol er, and sumatriptan.  Pharmacotherapy (Medications Ordered): No orders of the defined types were placed in this encounter.  Orders:  No orders of the defined types were placed in this encounter.  Follow-up plan:   No follow-ups on file.      Interventional Therapies  Risk  Complexity Considerations:   WNL   Planned  Pending:   Prednisone taper trial  Capsaicin cream vs Arnica cream trial  Preemptive Analgesia trial (use prior to physical therapy)    Under consideration:   Possible: Left Lumbar spinal cord stimulator trial #1  Diagnostic left ankle block #1  Diagnostic left foot steroid injections   Completed:   None at this time   Therapeutic  Palliative (PRN) options:   None established   Pharmacotherapy:   Recommendations:   Continue with gabapentin 300 mg at bedtime.  Consider increasing as tolerated.      Recent Visits Date Type Provider Dept  05/11/23 Office Visit Delano Metz, MD [facility]  04/20/23 Office Visit Delano Metz, MD [facility]  Showing recent visits within past 90 days and meeting all other requirements Future Appointments Date Type Provider Dept  05/23/23 Appointment Delano Metz, MD [facility]  Showing future appointments within next 90 days and meeting all other requirements  I discussed the assessment and treatment plan with the patient. The patient was provided an opportunity to ask questions and all were answered. The patient agreed with the plan and demonstrated an understanding of the instructions.  Patient advised to call back or seek an in-person evaluation if the symptoms or condition worsens.  Duration of encounter: *** minutes.  Total time on encounter, as per AMA  guidelines included both the face-to-face and non-face-to-face time personally spent by the physician and/or other qualified health care professional(s) on the day of the encounter (includes time in activities that require the physician or other qualified health care professional and does not include time in activities normally performed by clinical staff). Physician's time may include the following activities when performed: Preparing to see the patient (e.g., pre-charting review of records, searching for previously ordered imaging, lab work, and nerve conduction tests) Review of prior analgesic pharmacotherapies. Reviewing PMP Interpreting ordered tests (e.g., lab work, imaging, nerve conduction tests) Performing post-procedure evaluations, including interpretation of diagnostic procedures Obtaining and/or reviewing separately obtained history Performing a medically appropriate examination and/or evaluation Counseling and educating the patient/family/caregiver Ordering medications, tests, or procedures Referring and communicating with other health care professionals (when not separately reported) Documenting clinical information in the electronic or other health record Independently interpreting results (not separately reported) and communicating results to the patient/ family/caregiver Care coordination (not separately reported)  Note by: Oswaldo Done, MD Date: 05/23/2023; Time: 2:52 PM

## 2023-05-23 ENCOUNTER — Ambulatory Visit: Payer: 59 | Attending: Pain Medicine | Admitting: Pain Medicine

## 2023-05-23 VITALS — BP 133/91 | HR 77 | Temp 97.6°F | Resp 16 | Ht 68.0 in | Wt 148.0 lb

## 2023-05-23 DIAGNOSIS — M19172 Post-traumatic osteoarthritis, left ankle and foot: Secondary | ICD-10-CM | POA: Diagnosis present

## 2023-05-23 DIAGNOSIS — G8929 Other chronic pain: Secondary | ICD-10-CM | POA: Insufficient documentation

## 2023-05-23 DIAGNOSIS — M79672 Pain in left foot: Secondary | ICD-10-CM | POA: Diagnosis present

## 2023-05-23 DIAGNOSIS — G894 Chronic pain syndrome: Secondary | ICD-10-CM | POA: Insufficient documentation

## 2023-05-23 DIAGNOSIS — S92902S Unspecified fracture of left foot, sequela: Secondary | ICD-10-CM | POA: Insufficient documentation

## 2023-05-23 NOTE — Patient Instructions (Signed)

## 2023-05-24 ENCOUNTER — Telehealth: Payer: Self-pay | Admitting: Pain Medicine

## 2023-05-24 NOTE — Telephone Encounter (Signed)
 Patient's job released him and he no longer has insurance. Cannot go forward with [facility] at this time. He will call once he has insurance

## 2023-06-27 ENCOUNTER — Encounter: Payer: Self-pay | Admitting: Pain Medicine

## 2023-07-04 ENCOUNTER — Telehealth: Payer: Self-pay | Admitting: Pain Medicine

## 2023-07-04 NOTE — Progress Notes (Unsigned)
 PROVIDER NOTE: Interpretation of information contained herein should be left to medically-trained personnel. Specific patient instructions are provided elsewhere under "Patient Instructions" section of medical record. This document was created in part using AI and STT-dictation technology, any transcriptional errors that may result from this process are unintentional.  Patient: Joel Carr  Service: E/M   PCP: Bethanie Dicker, NP  DOB: 11-13-1985  DOS: 07/05/2023  Provider: Oswaldo Done, MD  MRN: [id number]  Delivery: Face-to-face  Specialty: Interventional Pain Management  Type: Established Patient  Setting: Ambulatory outpatient facility  Specialty designation: 09  Referring Prov.: Bethanie Dicker, NP  Location: Outpatient office facility       HPI  Mr. TAVAUGHN SILGUERO, a 38 y.o. year old male, is here today because of his Chronic foot pain, left [M79.672, G89.29]. Mr. Beougher primary complain today is No chief complaint on file.  Pertinent problems: [name] has RLS (restless legs syndrome); Tension headache; Migraine; Chronic foot pain (1ry area of Pain) (Left); Chronic pain syndrome; Chronic nonintractable headache; Headache disorder; Post-traumatic arthritis of foot (Left); and Closed fracture of bone of foot, sequela (Left) on their pertinent problem list. Pain Assessment: Severity of   is reported as a  /10. Location:    / . Onset:  . Quality:  . Timing:  . Modifying factor(s):  Marland Kitchen Vitals:  vitals were not taken for this visit.  BMI: Estimated body mass index is 22.5 kg/m as calculated from the following:   Height as of 05/23/23: 5\' 8"  (1.727 m).   Weight as of 05/23/23: 148 lb (67.1 kg). Last encounter: 05/23/2023. Last procedure: Visit date not found.  Reason for encounter: patient-requested evaluation. Previously scheduled for SCS, but due to loss of insurance, he postponed trial. Review of orders indicate it is still valid and will expire on 08/19/2023.   Discussed the use of AI scribe  software for clinical note transcription with the patient, who gave verbal consent to proceed.  History of Present Illness           Pharmacotherapy Assessment  Analgesic: No chronic opioid analgesics therapy prescribed by our practice. None MME/day: 0 mg/day   Monitoring:  PMP: PDMP reviewed during this encounter.       Pharmacotherapy: No side-effects or adverse reactions reported. Compliance: No problems identified. Effectiveness: Clinically acceptable.  No notes on file  No results found for: "CBDTHCR" No results found for: "D8THCCBX" No results found for: "D9THCCBX"  UDS:  Summary  Date Value Ref Range Status  05/11/2021 Note  Final    Comment:    ==================================================================== Compliance Drug Analysis, Ur ==================================================================== Test                             Result       Flag       Units  Drug Present and Declared for Prescription Verification   Desmethyldiazepam              225          EXPECTED   ng/mg creat   Oxazepam                       1367         EXPECTED   ng/mg creat   Temazepam                      762  EXPECTED   ng/mg creat    Desmethyldiazepam, oxazepam, and temazepam are expected metabolites    of diazepam. Desmethyldiazepam and oxazepam are also expected    metabolites of other drugs, including chlordiazepoxide, prazepam,    clorazepate, and halazepam. Oxazepam is an expected metabolite of    temazepam. Oxazepam and temazepam are also available as scheduled    prescription medications.    Gabapentin                     PRESENT      EXPECTED   Lamotrigine                    PRESENT      EXPECTED   Lurasidone                     PRESENT      EXPECTED   Acetaminophen                  PRESENT      EXPECTED   Verapamil                      PRESENT      EXPECTED  Drug Present not Declared for Prescription Verification   Carboxy-THC                    113           UNEXPECTED ng/mg creat    Carboxy-THC is a metabolite of tetrahydrocannabinol (THC). Source of    THC is most commonly herbal marijuana or marijuana-based products,    but THC is also present in a scheduled prescription medication.    Trace amounts of THC can be present in hemp and cannabidiol (CBD)    products. This test is not intended to distinguish between delta-9-    tetrahydrocannabinol, the predominant form of THC in most herbal or    marijuana-based products, and delta-8-tetrahydrocannabinol.  Drug Absent but Declared for Prescription Verification   Quetiapine                     Not Detected UNEXPECTED   Diclofenac                     Not Detected UNEXPECTED    Diclofenac, as indicated in the declared medication list, is not    always detected even when used as directed.  ==================================================================== Test                      Result    Flag   Units      Ref Range   Creatinine              63               mg/dL      >=40 ==================================================================== Declared Medications:  The flagging and interpretation on this report are based on the  following declared medications.  Unexpected results may arise from  inaccuracies in the declared medications.   **Note: The testing scope of this panel includes these medications:   Diazepam (Valium)  Gabapentin (Neurontin)  Lamotrigine (Lamictal)  Lurasidone (Latuda)  Quetiapine (Seroquel)  Verapamil (Calan)   **Note: The testing scope of this panel does not include small to  moderate amounts of these reported medications:   Acetaminophen (Tylenol)  Diclofenac (Voltaren)   **Note: The testing scope of this  panel does not include the  following reported medications:   Atorvastatin (Lipitor)  Fenofibrate (TriCor)  Multivitamin  Prednisone (Deltasone)  Sumatriptan (Imitrex) ==================================================================== For  clinical consultation, please call [phone]. ====================================================================       ROS  Constitutional: Denies any fever or chills Gastrointestinal: No reported hemesis, hematochezia, vomiting, or acute GI distress Musculoskeletal: Denies any acute onset joint swelling, redness, loss of ROM, or weakness Neurological: No reported episodes of acute onset apraxia, aphasia, dysarthria, agnosia, amnesia, paralysis, loss of coordination, or loss of consciousness  Medication Review  SUMAtriptan, amitriptyline, diazepam, gabapentin, lamoTRIgine, propranolol ER, and traZODone  History Review  Allergy: Mr. Hegner is allergic to tramadol. Drug: Mr. Doverspike  reports no history of drug use. Alcohol:  reports that he does not currently use alcohol. Tobacco:  reports that he has been smoking cigars and e-cigarettes. He has never used smokeless tobacco. Social: Mr. Kapur  reports that he has been smoking cigars and e-cigarettes. He has never used smokeless tobacco. He reports that he does not currently use alcohol. He reports that he does not use drugs. Medical:  has a past medical history of Anxiety, Bipolar disorder (HCC), Chronic pain syndrome, COVID-19 virus infection (12/23/2019), Depression, History of kidney stones, History of migraine, Hyperlipidemia, Pneumonia, and RLS (restless legs syndrome). Surgical: Mr. Cherne  has a past surgical history that includes Testicle surgery (N/A); Knee arthroscopy with meniscal repair (Right, 10/29/2014); Tympanoplasty (Bilateral, 1990); Arthrodesis metatarsal (Left, 09/19/2020); Open reduction internal fixation (orif) foot lisfranc fracture (Left, 09/19/2020); Foot arthrodesis (Left, 09/19/2020); Arthrodesis metatarsal (Left, 01/13/2023); and Foot arthrodesis (Left, 01/13/2023). Family: family history includes Alcohol abuse in his father; Depression in his father; Diabetes in his paternal grandfather; Heart attack in his paternal  grandfather.  Laboratory Chemistry Profile   Renal Lab Results  Component Value Date   BUN 11 02/17/2023   CREATININE 0.77 02/17/2023   BCR 12 06/12/2021   GFR 113.54 02/17/2023   GFRAA 131 08/08/2019   GFRNONAA >60 08/20/2020    Hepatic Lab Results  Component Value Date   AST 13 02/17/2023   ALT 11 02/17/2023   ALBUMIN 4.5 02/17/2023   ALKPHOS 65 02/17/2023   LIPASE 26 09/02/2016    Electrolytes Lab Results  Component Value Date   NA 140 02/17/2023   K 4.1 02/17/2023   CL 103 02/17/2023   CALCIUM 9.5 02/17/2023   MG 2.0 05/11/2021    Bone Lab Results  Component Value Date   25OHVITD1 35 05/11/2021   25OHVITD2 <1.0 05/11/2021   25OHVITD3 35 05/11/2021    Inflammation (CRP: Acute Phase) (ESR: Chronic Phase) Lab Results  Component Value Date   CRP <1 05/11/2021   ESRSEDRATE 2 05/11/2021         Note: Above Lab results reviewed.  Recent Imaging Review  DG MINI C-ARM IMAGE ONLY There is no interpretation for this exam.    This order is for images obtained during a surgical procedure.  Please See  "Surgeries" Tab for more information regarding the procedure. Note: Reviewed        Physical Exam  General appearance: Well nourished, well developed, and well hydrated. In no apparent acute distress Mental status: Alert, oriented x 3 (person, place, & time)       Respiratory: No evidence of acute respiratory distress Eyes: PERLA Vitals: There were no vitals taken for this visit. BMI: Estimated body mass index is 22.5 kg/m as calculated from the following:   Height as of 05/23/23: 5\' 8"  (1.727 m).  Weight as of 05/23/23: 148 lb (67.1 kg). Ideal: Patient weight not recorded  Assessment   Diagnosis Status  1. Chronic foot pain (1ry area of Pain) (Left)   2. Post-traumatic arthritis of foot (Left)   3. Closed fracture of bone of foot, sequela (Left)   4. Chronic pain syndrome    Controlled Controlled Controlled   Updated Problems: No problems  updated.  Plan of Care  Problem-specific:  Assessment and Plan            Mr. TAYTUM WHELLER has a current medication list which includes the following long-term medication(s): amitriptyline, gabapentin, lamotrigine, propranolol er, and sumatriptan.  Pharmacotherapy (Medications Ordered): No orders of the defined types were placed in this encounter.  Orders:  No orders of the defined types were placed in this encounter.  Follow-up plan:   No follow-ups on file.     Interventional Therapies  Risk  Complexity Considerations:   WNL   Planned  Pending:   Possible: Left Lumbar spinal cord stimulator trial #1    Under consideration:   Possible: Left Lumbar spinal cord stimulator trial #1    Completed:   Prednisone taper trial  Capsaicin cream vs Arnica cream trial  Preemptive Analgesia trial (use prior to physical therapy)    Therapeutic  Palliative (PRN) options:   None established   Pharmacotherapy:   Recommendations:   Continue with gabapentin 300 mg at bedtime.  Consider increasing as tolerated.     Recent Visits Date Type Provider Dept  05/23/23 Office Visit Delano Metz, MD [facility]  05/11/23 Office Visit Delano Metz, MD [facility]  04/20/23 Office Visit Delano Metz, MD [facility]  Showing recent visits within past 90 days and meeting all other requirements Future Appointments Date Type Provider Dept  07/05/23 Appointment Delano Metz, MD [facility]  Showing future appointments within next 90 days and meeting all other requirements  I discussed the assessment and treatment plan with the patient. The patient was provided an opportunity to ask questions and all were answered. The patient agreed with the plan and demonstrated an understanding of the instructions.  Patient advised to call back or seek an in-person evaluation if the symptoms or condition worsens.  Duration of encounter:  *** minutes.  Total time on encounter, as per AMA guidelines included both the face-to-face and non-face-to-face time personally spent by the physician and/or other qualified health care professional(s) on the day of the encounter (includes time in activities that require the physician or other qualified health care professional and does not include time in activities normally performed by clinical staff). Physician's time may include the following activities when performed: Preparing to see the patient (e.g., pre-charting review of records, searching for previously ordered imaging, lab work, and nerve conduction tests) Review of prior analgesic pharmacotherapies. Reviewing PMP Interpreting ordered tests (e.g., lab work, imaging, nerve conduction tests) Performing post-procedure evaluations, including interpretation of diagnostic procedures Obtaining and/or reviewing separately obtained history Performing a medically appropriate examination and/or evaluation Counseling and educating the patient/family/caregiver Ordering medications, tests, or procedures Referring and communicating with other health care professionals (when not separately reported) Documenting clinical information in the electronic or other health record Independently interpreting results (not separately reported) and communicating results to the patient/ family/caregiver Care coordination (not separately reported)  Note by: Oswaldo Done, MD (TTS and AI technology used. I apologize for any typographical errors that were not detected and corrected.) Date: 07/05/2023; Time: 7:20 PM

## 2023-07-05 ENCOUNTER — Ambulatory Visit: Attending: Pain Medicine | Admitting: Pain Medicine

## 2023-07-05 ENCOUNTER — Encounter: Payer: Self-pay | Admitting: Pain Medicine

## 2023-07-05 VITALS — BP 132/84 | HR 73 | Temp 97.4°F | Resp 16 | Ht 68.0 in | Wt 148.0 lb

## 2023-07-05 DIAGNOSIS — S92902S Unspecified fracture of left foot, sequela: Secondary | ICD-10-CM | POA: Diagnosis present

## 2023-07-05 DIAGNOSIS — M79672 Pain in left foot: Secondary | ICD-10-CM | POA: Diagnosis present

## 2023-07-05 DIAGNOSIS — M792 Neuralgia and neuritis, unspecified: Secondary | ICD-10-CM | POA: Insufficient documentation

## 2023-07-05 DIAGNOSIS — G894 Chronic pain syndrome: Secondary | ICD-10-CM | POA: Diagnosis present

## 2023-07-05 DIAGNOSIS — M19172 Post-traumatic osteoarthritis, left ankle and foot: Secondary | ICD-10-CM

## 2023-07-05 DIAGNOSIS — G8929 Other chronic pain: Secondary | ICD-10-CM

## 2023-07-05 NOTE — Patient Instructions (Signed)

## 2023-07-06 ENCOUNTER — Encounter: Payer: Self-pay | Admitting: Pain Medicine

## 2023-07-17 IMAGING — CR DG FOOT COMPLETE 3+V*L*
3 series · 3 of 3 positions shown · non-contrast
Comparison: X-ray/CT foot 09/07/2020.

CLINICAL DATA: Left foot pain status post surgery one year ago.

EXAM:
LEFT FOOT - COMPLETE 3+ VIEW

[foot ap]
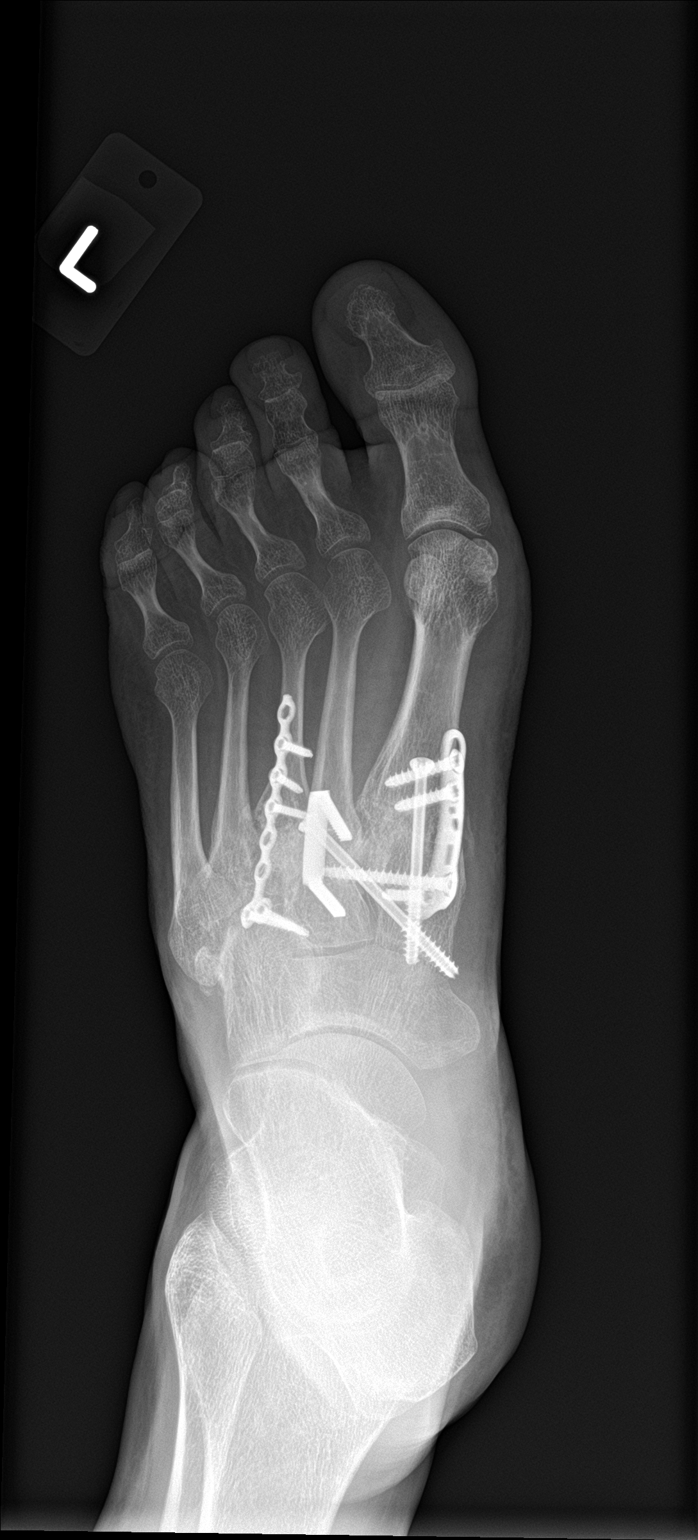

[foot obl]
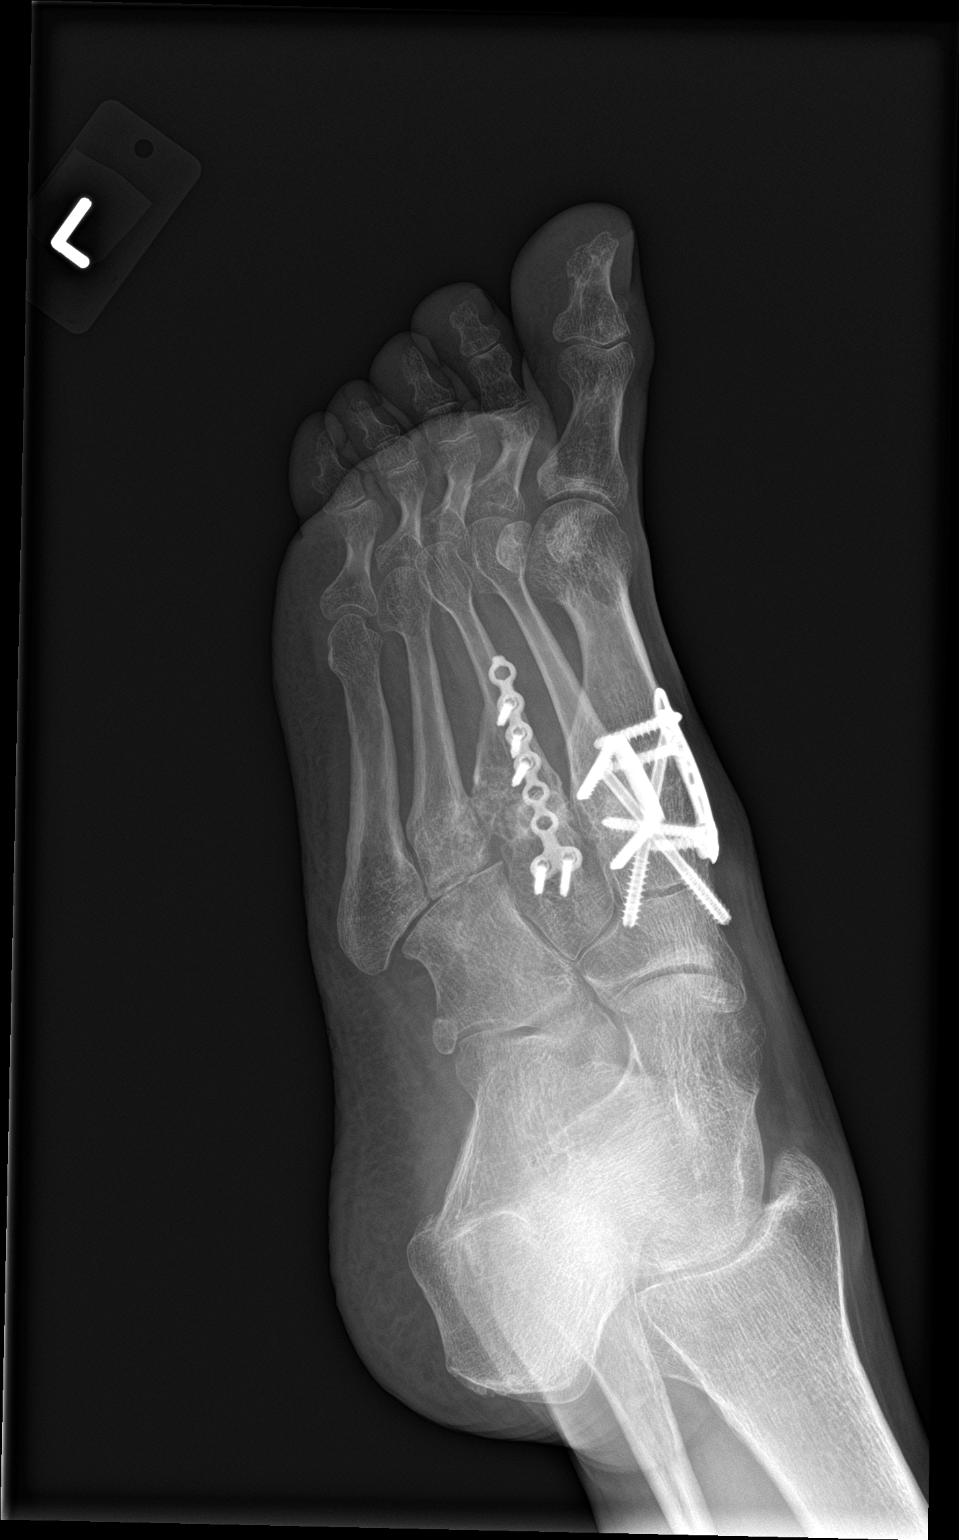

[foot lat]
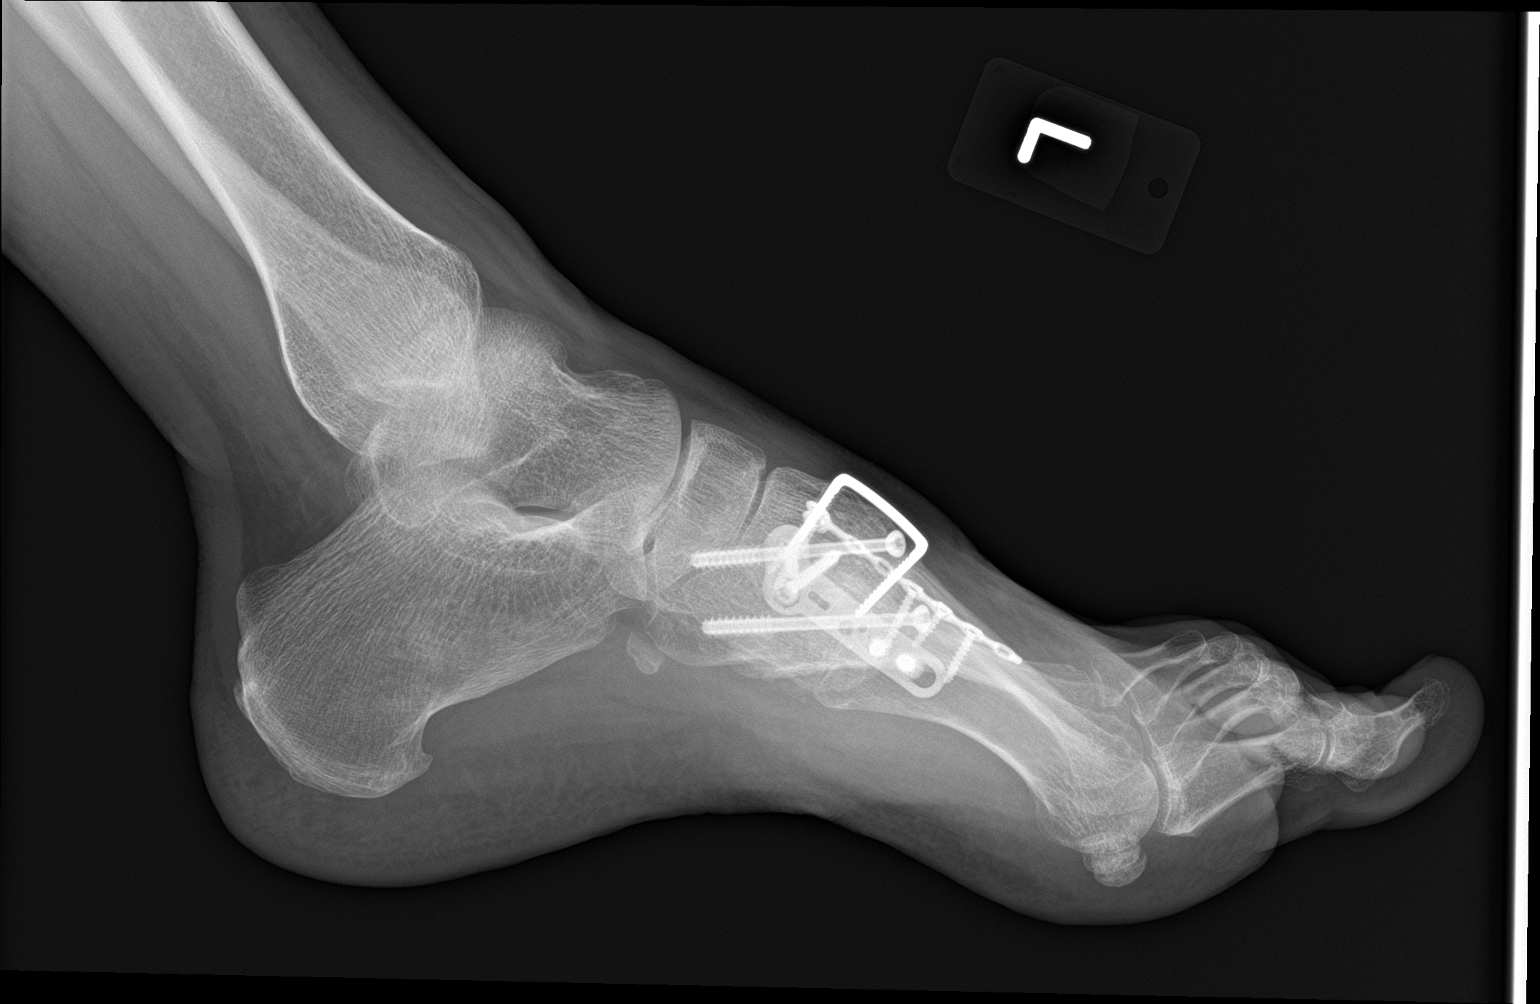

[3 of 3 positions shown; findings below may reference images not displayed]

FINDINGS: Fixation plates and screws involving the first through third
tarsometatarsal joints intact. No acute fracture or dislocation.
Remainder of the exam is unremarkable.
IMPRESSION: 1. No acute findings.
2. Hardware intact over the first through third tarsometatarsal
joints.

## 2023-07-24 ENCOUNTER — Emergency Department
Admission: EM | Admit: 2023-07-24 | Discharge: 2023-07-25 | Disposition: A | Discharge status: Other Acute Inpatient Hospital | Attending: Emergency Medicine | Admitting: Emergency Medicine

## 2023-07-24 ENCOUNTER — Other Ambulatory Visit: Payer: Self-pay

## 2023-07-24 ENCOUNTER — Encounter: Payer: Self-pay | Admitting: Emergency Medicine

## 2023-07-24 DIAGNOSIS — F32A Depression, unspecified: Secondary | ICD-10-CM | POA: Diagnosis present

## 2023-07-24 DIAGNOSIS — R45851 Suicidal ideations: Secondary | ICD-10-CM | POA: Diagnosis not present

## 2023-07-24 DIAGNOSIS — F1990 Other psychoactive substance use, unspecified, uncomplicated: Secondary | ICD-10-CM

## 2023-07-24 DIAGNOSIS — F1910 Other psychoactive substance abuse, uncomplicated: Secondary | ICD-10-CM | POA: Insufficient documentation

## 2023-07-24 LAB — CBC
HCT: 42.6 % (ref 39.0–52.0)
Hemoglobin: 14.9 g/dL (ref 13.0–17.0)
MCH: 31.6 pg (ref 26.0–34.0)
MCHC: 35 g/dL (ref 30.0–36.0)
MCV: 90.4 fL (ref 80.0–100.0)
Platelets: 302 10*3/uL (ref 150–400)
RBC: 4.71 MIL/uL (ref 4.22–5.81)
RDW: 12.2 % (ref 11.5–15.5)
WBC: 7.8 10*3/uL (ref 4.0–10.5)
nRBC: 0 % (ref 0.0–0.2)

## 2023-07-24 LAB — COMPREHENSIVE METABOLIC PANEL WITH GFR
ALT: 10 U/L (ref 0–44)
AST: 19 U/L (ref 15–41)
Albumin: 3.6 g/dL (ref 3.5–5.0)
Alkaline Phosphatase: 37 U/L — ABNORMAL LOW (ref 38–126)
Anion gap: 6 (ref 5–15)
BUN: 5 mg/dL — ABNORMAL LOW (ref 6–20)
CO2: 25 mmol/L (ref 22–32)
Calcium: 9 mg/dL (ref 8.9–10.3)
Chloride: 104 mmol/L (ref 98–111)
Creatinine, Ser: 0.78 mg/dL (ref 0.61–1.24)
GFR, Estimated: >60 mL/min (ref >60)
Glucose, Bld: 132 mg/dL — ABNORMAL HIGH (ref 70–99)
Potassium: 3.2 mmol/L — ABNORMAL LOW (ref 3.5–5.1)
Sodium: 135 mmol/L (ref 135–145)
Total Bilirubin: 0.5 mg/dL (ref 0.0–1.2)
Total Protein: 6.1 g/dL — ABNORMAL LOW (ref 6.5–8.1)

## 2023-07-24 LAB — URINE DRUG SCREEN, QUALITATIVE (ARMC ONLY)
Amphetamines, Ur Screen: NOT DETECTED
Barbiturates, Ur Screen: NOT DETECTED
Benzodiazepine, Ur Scrn: POSITIVE — AB
Cannabinoid 50 Ng, Ur ~~LOC~~: NOT DETECTED
Cocaine Metabolite,Ur ~~LOC~~: POSITIVE — AB
MDMA (Ecstasy)Ur Screen: NOT DETECTED
Methadone Scn, Ur: NOT DETECTED
Opiate, Ur Screen: NOT DETECTED
Phencyclidine (PCP) Ur S: NOT DETECTED
Tricyclic, Ur Screen: POSITIVE — AB

## 2023-07-24 LAB — ETHANOL: Alcohol, Ethyl (B): <15 mg/dL (ref <15)

## 2023-07-24 LAB — SALICYLATE LEVEL: Salicylate Lvl: <7 mg/dL — ABNORMAL LOW (ref 7.0–30.0)

## 2023-07-24 LAB — ACETAMINOPHEN LEVEL: Acetaminophen (Tylenol), Serum: <10 ug/mL — ABNORMAL LOW (ref 10–30)

## 2023-07-24 NOTE — ED Notes (Signed)
 This tech obtained vital signs on pt.

## 2023-07-24 NOTE — ED Notes (Signed)
 VOL pending consult, nothing in computer

## 2023-07-24 NOTE — ED Notes (Signed)
 Pt sitting in bed eating lunch tray, no other needs at this time.

## 2023-07-24 NOTE — BH Assessment (Signed)
 Comprehensive Clinical Assessment (CCA) Screening, Triage and Referral Note  07/24/2023 [facility] STEBER [id number]  Sherald Dienes, 38 year old male who presents to [facility] ED voluntarily for treatment. Per triage note, Patient to ED via POV for psych evaluation. PT reports he is in the middle of a divorce and having thoughts of SI without a plan. Pt has therapist. Tearful in triage. States he has not been eating or drinking much recently.   During TTS assessment pt presents alert and oriented x 4, restless but cooperative, and mood-congruent with affect. The pt does not appear to be responding to internal or external stimuli. Neither is the pt presenting with any delusional thinking. Pt verified the information provided to triage RN.   Pt identifies his main complaint to be that "it hasn't been a good week." Patient reports he and his wife recently celebrated their 17-year anniversary and a few days later, he learned that his wife wanted a divorce. Patient states he has not eaten or slept well this week. Patient reports endorsing suicidal thoughts. "I rather not be alive than not have her in my life." Patient reports being diagnosed with Bipolar/depression 19 years ago. Patient reports he is compliant with his medications and takes them as prescribed. Patient states he is being followed by his psychiatrist, Denson Flake at [facility] Patient lives with his wife and 2 teenaged children. Patient says given his history, he and his wife thought it was best if patient checked himself in. Patient reports there are no guns in the home nor does he have access to any. Patient admits to occasional alcohol use from time to time. Pt reports one-time INPT hx at [facility]. Pt reports no family hx of MH. Pt denies HI/AH/VH. Pt is not able to contract for safety.    Per Alaine Howells, NP, pt is recommended for inpatient psychiatric admission.   Chief Complaint:  Chief Complaint  Patient presents with   Psychiatric Evaluation    Visit Diagnosis: Bipolar  Patient Reported Information How did you hear about us ? Self  What Is the Reason for Your Visit/Call Today? Patient endorsing SI  How Long Has This Been Causing You Problems? 1 wk - 1 month  What Do You Feel Would Help You the Most Today? Treatment for Depression or other mood problem; Medication(s)   Have You Recently Had Any Thoughts About Hurting Yourself? Yes  Are You Planning to Commit Suicide/Harm Yourself At This time? No   Have you Recently Had Thoughts About Hurting Someone Marigene Shoulder? No  Are You Planning to Harm Someone at This Time? No  Explanation: No data recorded  Have You Used Any Alcohol or Drugs in the Past 24 Hours? No data recorded How Long Ago Did You Use Drugs or Alcohol? No data recorded What Did You Use and How Much? No data recorded  Do You Currently Have a Therapist/Psychiatrist? Yes  Name of Therapist/Psychiatrist: Denson Flake- [facility]   Have You Been Recently Discharged From Any Office Practice or Programs? No  Explanation of Discharge From Practice/Program: No data recorded   CCA Screening Triage Referral Assessment Type of Contact: Face-to-Face  Telemedicine Service Delivery:   Is this Initial or Reassessment?   Date Telepsych consult ordered in [facility]:    Time Telepsych consult ordered in [facility]:    Location of Assessment: [facility] ED  Provider Location: [facility] ED    Collateral Involvement: None provided   Does Patient Have a Court Appointed Legal Guardian? No data recorded Name and Contact  of Legal Guardian: No data recorded If Minor and Not Living with Parent(s), Who has Custody? No data recorded Is CPS involved or ever been involved? Never  Is APS involved or ever been involved? Never   Patient Determined To Be At Risk for Harm To Self or Others Based on Review of Patient Reported Information or Presenting Complaint? Yes, for Self-Harm  Method: No Plan  Availability of Means: No access or  NA  Intent: Vague intent or NA  Notification Required: No need or identified person  Additional Information for Danger to Others Potential: No data recorded Additional Comments for Danger to Others Potential: No data recorded Are There Guns or Other Weapons in Your Home? No  Types of Guns/Weapons: No data recorded Are These Weapons Safely Secured?                            No data recorded Who Could Verify You Are Able To Have These Secured: No data recorded Do You Have any Outstanding Charges, Pending Court Dates, Parole/Probation? No data recorded Contacted To Inform of Risk of Harm To Self or Others: No data recorded  Does Patient Present under Involuntary Commitment? No    [place] of Residence: [place]   Patient Currently Receiving the Following Services: Individual Therapy; Medication Management   Determination of Need: Emergent (2 hours)   Options For Referral: ED Visit; Medication Management; Inpatient Hospitalization   Disposition Recommendation per psychiatric provider: Per Alaine Howells, NP, pt is recommended for inpatient psychiatric admission.  Addysyn Fern Willian Harrow, Counselor, LCAS-A

## 2023-07-24 NOTE — ED Notes (Signed)
 Pt asked by this RN if he has any needs, pt denies at this time

## 2023-07-24 NOTE — ED Notes (Addendum)
 Patient's wife, Christoffer Novakovic, taking all belongings home at this time.

## 2023-07-24 NOTE — ED Notes (Signed)
 Pt given dinner tray.

## 2023-07-24 NOTE — ED Provider Notes (Signed)
 [facility] Provider Note    Event Date/Time   First MD Initiated Contact with Patient 07/24/23 1243     (approximate)   History   Psychiatric Evaluation   HPI  Joel Carr is a 38 y.o. male with history of anxiety, bipolar disorder, depression, presenting with SI.  States that he does not plan but is feeling more down lately.  Especially after he found out that his wife wanted to divorce him.  States that he had been drinking daily after he found out but no history of withdrawals.  No other drug use.  States that he is seen by psychiatry and his therapist but only able to see a psychiatrist once every 3 months.     Physical Exam   Triage Vital Signs: ED Triage Vitals  Encounter Vitals Group     BP 07/24/23 1149 (!) 108/94     Systolic BP Percentile --      Diastolic BP Percentile --      Pulse Rate 07/24/23 1149 85     Resp 07/24/23 1149 17     Temp 07/24/23 1149 97.9 F (36.6 C)     Temp Source 07/24/23 1149 Oral     SpO2 07/24/23 1149 100 %     Weight 07/24/23 1147 148 lb (67.1 kg)     Height 07/24/23 1147 5\' 8"  (1.727 m)     Head Circumference --      Peak Flow --      Pain Score 07/24/23 1146 9     Pain Loc --      Pain Education --      Exclude from Growth Chart --     Most recent vital signs: Vitals:   07/24/23 1149  BP: (!) 108/94  Pulse: 85  Resp: 17  Temp: 97.9 F (36.6 C)  SpO2: 100%     General: Awake, no distress.  CV:  Good peripheral perfusion.  Resp:  Normal effort.  Abd:  No distention.  Other:  No external signs of trauma, calm, slightly flat affect.   ED Results / Procedures / Treatments   Labs (all labs ordered are listed, but only abnormal results are displayed) Labs Reviewed  COMPREHENSIVE METABOLIC PANEL WITH GFR - Abnormal; Notable for the following components:      Result Value   Potassium 3.2 (*)    Glucose, Bld 132 (*)    BUN 5 (*)    Total Protein 6.1 (*)    Alkaline Phosphatase 37 (*)     All other components within normal limits  SALICYLATE LEVEL - Abnormal; Notable for the following components:   Salicylate Lvl <7.0 (*)    All other components within normal limits  ACETAMINOPHEN  LEVEL - Abnormal; Notable for the following components:   Acetaminophen  (Tylenol ), Serum <10 (*)    All other components within normal limits  URINE DRUG SCREEN, QUALITATIVE ([facility] ONLY) - Abnormal; Notable for the following components:   Tricyclic, Ur Screen POSITIVE (*)    Cocaine Metabolite,Ur [place] POSITIVE (*)    Benzodiazepine, Ur Scrn POSITIVE (*)    All other components within normal limits  ETHANOL  CBC      PROCEDURES:  Critical Care performed: No  Procedures   MEDICATIONS ORDERED IN ED: Medications - No data to display   IMPRESSION / MDM / ASSESSMENT AND PLAN / ED COURSE  I reviewed the triage vital signs and the nursing notes.  Differential diagnosis includes, but is not limited to, decompensated psych, depression, suicidal ideation.  Will get labs, plan to medically clear for psych.  Will keep involuntary for now given that he has no plan and is willing to stay.  Patient's presentation is most consistent with acute presentation with potential threat to life or bodily function.  Independent review of labs below.  Patient is medically cleared for psychiatric evaluation.  Clinical Course as of 07/24/23 1310  Sun Jul 24, 2023  1308 Independent review of labs, Tylenol , salicylate, ethanol levels are not elevated, no leukocytosis, electrolytes not severely deranged, he is TCA, cocaine as well as benzo positive. [TT]    Clinical Course User Index [TT] Drenda Gentle Richard Champion, MD     FINAL CLINICAL IMPRESSION(S) / ED DIAGNOSES   Final diagnoses:  Suicidal ideation  Depression, unspecified depression type  Substance use     Rx / DC Orders   ED Discharge Orders     None        Note:  This document was prepared using Dragon voice  recognition software and may include unintentional dictation errors.    Shane Darling, MD 07/24/23 1310

## 2023-07-24 NOTE — ED Notes (Signed)
 Patient to BHU 2 from main ED.  Patient oriented to unit in regards to cameras and rounding.  Patient verbalized understanding.  Patient instructed to come to nsg station for any concerns.

## 2023-07-24 NOTE — ED Triage Notes (Signed)
 Patient to ED via POV for psych evaluation. PT reports he is in the middle of a divorce and having thoughts of SI without a plan. Pt has therapist. Tearful in triage. States he has not been eating or drinking much recently.

## 2023-07-24 NOTE — Consult Note (Signed)
 [facility] Psychiatric Consult Initial  Patient Name: .Joel Carr  MRN: [id number]  DOB: May 14, 1985  Consult Order details:  Orders (From admission, onward)     Start     Ordered   07/24/23 1310  CONSULT TO CALL ACT TEAM       Ordering Provider: Shane Darling, MD  Provider:  (Not yet assigned)  Question:  Reason for Consult?  Answer:  Psych consult   07/24/23 1309   07/24/23 1310  IP CONSULT TO PSYCHIATRY       Ordering Provider: Shane Darling, MD  Provider:  (Not yet assigned)  Question Answer Comment  Consult Timeframe STAT - requires a response within one hour   STAT timeframe requires provider to provider communication, has the provider to provider communication been completed Yes   Reason for Consult? Consult for medication management   Contact phone number where the requesting provider can be reached [phone]      07/24/23 1309             Mode of Visit: Tele-visit Virtual Statement:TELE PSYCHIATRY ATTESTATION & CONSENT As the provider for this telehealth consult, I attest that I verified the patient's identity using two separate identifiers, introduced myself to the patient, provided my credentials, disclosed my location, and performed this encounter via a HIPAA-compliant, real-time, face-to-face, two-way, interactive audio and video platform and with the full consent and agreement of the patient (or guardian as applicable.) Patient physical location: [facility]. Telehealth provider physical location: home office in state of [place].   Video start time: 4:21 PM Video end time: 4:45 PM    Psychiatry Consult Evaluation  Service Date: July 24, 2023 LOS:  LOS: 0 days  Chief Complaint "I found out Monday that my wife wants a divorce. Not sleeping, not eating, body weak. Suicidal thoughts". I'd rather not be alive than not have her".  Primary Psychiatric Diagnoses  Suicidal ideations  Assessment  Joel Carr is a 38 y.o. male presents to [facility] ED on 07/24/2023 at 12:42 PM due to suicidal  ideations.  Patient reports recently finding out that his wife wants a divorce.  Patient states that he would rather be dead than to live without her.  Patient reports a previous psychiatric hospitalization due to suicidal ideations but states that his symptoms today are worse.  Patient reports that he has not established a plan but states he does not trust himself not to kill himself.  Per his record, patient has a medical history of GERD, chronic pain, arthritis, hyperlipidemia, restless leg syndrome, asthma and migraines.  He reports a psychiatric history of anxiety, insomnia, bipolar disorder.  He reports being established with a psychiatric provider, Joel [facility], at [facility] [place]  for the past couple of years.  Patient reports being diagnosed with bipolar disorder 19 years ago.  He reports being prescribed Lamictal , gabapentin , valuim PRN, amitriptolyne, and trazodone but unsure of dosages.  He reports medication compliance and denies adverse medication effects.  Patient reports also being established with a therapist and states that he has his next appointment tomorrow. He reports sleeping approximately 8 hours over the past week.  Patient reports drinking alcohol, stating that he drinks 1 corona per day.  Patient reports last beer was yesterday.  He denies illicit substance use although UDS is positive for cocaine metabolite. Patient denies homicidal ideations, paranoia, delusional thought, auditory hallucinations but states that he should see shadows at times.  Patient reports a history of marijuana use  but no recent use.    Given patient's psychiatric history and active suicidal ideations with intent, patient meets criteria for inpatient psychiatry.    Diagnoses:  Active Hospital problems: Active Problems:   Suicidal ideation    Plan   ## Psychiatric Medication Recommendations:  Restart home medication once pharmacy clarify his current dosages.  ##  Medical Decision Making Capacity: Not specifically addressed in this encounter  ## Further Work-up:  -- Deferred to EDP EKG -- most recent EKG on 10/24/2019 had QtC of 459 -- Pertinent labwork reviewed earlier this admission includes: CMP, CBC, salicylate and Tylenol  level, UDS positive for benzodiazepine and cocaine and tricyclics   ## Disposition:-- We recommend inpatient psychiatric hospitalization when medically cleared. Patient is under voluntary admission status at this time; please IVC if attempts to leave hospital.  ## Behavioral / Environmental: - No specific recommendations at this time.     ## Safety and Observation Level:  - Based on my clinical evaluation, I estimate the patient to be at low risk of self harm in the current setting. - At this time, we recommend routine. This decision is based on my review of the chart including patient's history and current presentation, interview of the patient, mental status examination, and consideration of suicide risk including evaluating suicidal ideation, plan, intent, suicidal or self-harm behaviors, risk factors, and protective factors. This judgment is based on our ability to directly address suicide risk, implement suicide prevention strategies, and develop a safety plan while the patient is in the clinical setting. Please contact our team if there is a concern that risk level has changed.  CSSR Risk Category:C-SSRS RISK CATEGORY: High Risk  Suicide Risk Assessment: Patient has following modifiable risk factors for suicide: active suicidal ideation, which we are addressing by inpatient psychiatry. Patient has following non-modifiable or demographic risk factors for suicide: male gender and separation or divorce Patient has the following protective factors against suicide: Supportive family and Minor children in the home  Thank you for this consult request. Recommendations have been communicated to the primary team.  We will recommend  inpatient psychiatry at this time.   Joel Antigua, NP       History of Present Illness  Relevant Aspects of Hospital ED Course:  Admitted on 07/24/2023 for suicidal ideations.   Patient Report:  "My depression is worse"  Psych ROS:  Depression: Current presentation Anxiety: Patient has a history of anxiety Mania (lifetime and current): Patient has a history of bipolar disorder Psychosis: (lifetime and current): Reports seeing shadows at times   Review of Systems  Psychiatric/Behavioral:  Positive for depression and suicidal ideas. The patient has insomnia.   All other systems reviewed and are negative.    Psychiatric and Social History  Psychiatric History:  Information collected from patient, ED treatment  Prev Dx/Sx: bipolar disorder Current Psych Provider: Yes see above Home Meds (current): Lamictal .  See above Previous Med Trials: denies Therapy: yes see above  Prior Psych Hospitalization: yes. 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 [employer] Hx: denies Living Situation: lives with wife and two children, son 21 yo and daughter 70 yo Spiritual Hx: none Access to weapons/lethal means: no   Substance History Alcohol: Yes see above Type of alcohol  beer Last Drink yesterday Number of drinks per day 1 History of alcohol withdrawal seizures denies History of DT's denies Tobacco: Yes  Illicit drugs: Denies although UDS was positive for cocaine Prescription drug abuse: Denies Rehab hx: Denies  Exam Findings  Physical Exam: No abnormalities observed Vital Signs:  Temp:  [97.9 F (36.6 C)-98.3 F (36.8 C)] 98.3 F (36.8 C) (04/27 2047) Pulse Rate:  [73-85] 73 (04/27  2047) Resp:  [17] 17 (04/27 2047) BP: (108-132)/(61-94) 132/61 (04/27 2047) SpO2:  [100 %] 100 % (04/27 2047) Weight:  [67.1 kg] 67.1 kg (04/27 1147) Blood pressure 132/61, pulse 73, temperature 98.3 F (36.8 C), temperature source Oral, resp. rate 17, height 5\' 8"  (1.727 m), weight 67.1 kg, SpO2 100%. Body mass index is 22.5 kg/m.  Physical Exam Vitals and nursing note reviewed.  Constitutional:      Appearance: Normal appearance.  Neurological:     General: No focal deficit present.     Mental Status: He is alert and oriented to person, place, and time.     Mental Status Exam: General Appearance: Fairly Groomed  Orientation:  Full (Time, Place, and Person)  Memory: Good  Concentration: Good  Recall: Good  Attention good  Eye Contact: Good  Speech: Clear and coherent  Language: Good   Volume: Normal  Mood: Depressed  Affect: Congruent  Thought Process:  Goal Directed  Thought Content:  Illogical  Suicidal Thoughts:  Yes.  with intent/plan no plan  Homicidal Thoughts:  No  Judgement:  Impaired  Insight:  Lacking  Psychomotor Activity:  Normal  Akathisia:  NA  Fund of Knowledge:  Good      Assets:  Communication Skills Desire for Improvement Financial Resources/Insurance Housing Social Support  Cognition:  WNL  ADL's:  Intact  AIMS (if indicated):        Other History   These have been pulled in through the EMR, reviewed, and updated if appropriate.  Family History:  The patient's family history includes Alcohol abuse in his father; Depression in his father; Diabetes in his paternal grandfather; Heart attack in his paternal grandfather.  Medical History: Past Medical History:  Diagnosis Date   Anxiety    Bipolar disorder (HCC)    Chronic pain syndrome    COVID-19 virus infection 12/23/2019   mild congestion and body aches   Depression    History of kidney stones    History of migraine    Hyperlipidemia    Pneumonia    RLS (restless legs syndrome)      Surgical History: Past Surgical History:  Procedure Laterality Date   ARTHRODESIS METATARSAL Left 09/19/2020   Procedure: ARTHRODESIS METATARSAL; LISFRANC MULTIPLE-1/2/3;  Surgeon: Pink Bridges, DPM;  Location: [facility];  Service: Podiatry;  Laterality: Left;   ARTHRODESIS METATARSAL Left 01/13/2023   Procedure: ARTHRODESIS; LISFRANC; MULTIPLE FOURTH AND FIFTH;  Surgeon: Pink Bridges, DPM;  Location: [facility];  Service: Orthopedics/Podiatry;  Laterality: Left;   FOOT ARTHRODESIS Left 09/19/2020   Procedure: ARTHRODESIS FOOT- *POSSIBLE BONE GRAFT;  Surgeon: Pink Bridges, DPM;  Location: [facility];  Service: Podiatry;  Laterality: Left;   FOOT ARTHRODESIS Left 01/13/2023   Procedure: AUTOGRAFT HARVEST;  Surgeon: Pink Bridges, DPM;  Location: [facility];  Service: Orthopedics/Podiatry;  Laterality: Left;   KNEE ARTHROSCOPY WITH MENISCAL REPAIR Right 10/29/2014   Procedure: KNEE ARTHROSCOPY WITH MENISCAL REPAIR;  Surgeon: Molli Angelucci, MD;  Location: [facility];  Service: Orthopedics;  Laterality: Right;   OPEN REDUCTION INTERNAL FIXATION (ORIF) FOOT  LISFRANC FRACTURE Left 09/19/2020   Procedure: OPEN REDUCTION INTERNAL FIXATION (ORIF) FOOT LISFRANC FRACTURE x2 4/5  PERCUTANEOUS SKELETAL FIXATION OF TARSOMETATARSAL JOINT DISLOCATION WITH MANIPULATION, X2 4/5;  Surgeon: Pink Bridges, DPM;  Location: [facility];  Service: Podiatry;  Laterality: Left;   TESTICLE SURGERY N/A    as a child   TYMPANOPLASTY Bilateral 1990     Medications:  No current facility-administered medications for this encounter.  Current Outpatient Medications:    amitriptyline (ELAVIL) 25 MG tablet, Take 25 mg by mouth at bedtime., Disp: , Rfl:    gabapentin  (NEURONTIN ) 300 MG capsule, Take 1 capsule (300 mg total) by mouth at bedtime., Disp: 30 capsule, Rfl: 3   lamoTRIgine  (LAMICTAL ) 100 MG tablet, Take 1 tablet (100 mg total) by mouth 2 (two) times daily., Disp: 180 tablet, Rfl: 0   propranolol  ER  (INDERAL  LA) 80 MG 24 hr capsule, Take 1 capsule (80 mg total) by mouth daily., Disp: 90 capsule, Rfl: 3   SUMAtriptan  (IMITREX ) 25 MG tablet, Take 1 tablet by mouth at onset of headache. May repeat in 2 hours if headache persists or recurs. Max 100 mg/24 hours. (Patient taking differently: Take 25 mg by mouth every 2 (two) hours as needed for migraine or headache. Take 1 tablet by mouth at onset of headache. May repeat in 2 hours if headache persists or recurs. Max 100 mg/24 hours.), Disp: 10 tablet, Rfl: 5   traZODone (DESYREL) 100 MG tablet, Take 200 mg by mouth at bedtime as needed for sleep., Disp: , Rfl:   Allergies: Allergies  Allergen Reactions   Tramadol  Other (See Comments)    Jittery  Other Reaction(s): confusion    Joel Antigua, NP

## 2023-07-25 ENCOUNTER — Encounter (HOSPITAL_COMMUNITY): Payer: Self-pay | Admitting: Psychiatry

## 2023-07-25 ENCOUNTER — Inpatient Hospital Stay (HOSPITAL_COMMUNITY)
Admission: AD | Admit: 2023-07-25 | Discharge: 2023-08-03 | DRG: 885 | Disposition: A | Discharge status: Home / Self Care | Payer: Self-pay | Source: Intra-hospital | Attending: Psychiatry | Admitting: Psychiatry

## 2023-07-25 ENCOUNTER — Encounter (HOSPITAL_COMMUNITY): Payer: Self-pay

## 2023-07-25 DIAGNOSIS — G2581 Restless legs syndrome: Secondary | ICD-10-CM | POA: Diagnosis present

## 2023-07-25 DIAGNOSIS — F419 Anxiety disorder, unspecified: Secondary | ICD-10-CM | POA: Diagnosis present

## 2023-07-25 DIAGNOSIS — G894 Chronic pain syndrome: Secondary | ICD-10-CM | POA: Diagnosis present

## 2023-07-25 DIAGNOSIS — F3130 Bipolar disorder, current episode depressed, mild or moderate severity, unspecified: Secondary | ICD-10-CM | POA: Diagnosis present

## 2023-07-25 DIAGNOSIS — Z79899 Other long term (current) drug therapy: Secondary | ICD-10-CM | POA: Diagnosis not present

## 2023-07-25 DIAGNOSIS — F17210 Nicotine dependence, cigarettes, uncomplicated: Secondary | ICD-10-CM | POA: Diagnosis present

## 2023-07-25 DIAGNOSIS — Z833 Family history of diabetes mellitus: Secondary | ICD-10-CM | POA: Diagnosis not present

## 2023-07-25 DIAGNOSIS — Z8616 Personal history of COVID-19: Secondary | ICD-10-CM | POA: Diagnosis not present

## 2023-07-25 DIAGNOSIS — J45909 Unspecified asthma, uncomplicated: Secondary | ICD-10-CM | POA: Diagnosis present

## 2023-07-25 DIAGNOSIS — F3132 Bipolar disorder, current episode depressed, moderate: Secondary | ICD-10-CM

## 2023-07-25 DIAGNOSIS — Z818 Family history of other mental and behavioral disorders: Secondary | ICD-10-CM | POA: Diagnosis not present

## 2023-07-25 DIAGNOSIS — Z560 Unemployment, unspecified: Secondary | ICD-10-CM | POA: Diagnosis not present

## 2023-07-25 DIAGNOSIS — Z5986 Financial insecurity: Secondary | ICD-10-CM

## 2023-07-25 DIAGNOSIS — G4700 Insomnia, unspecified: Secondary | ICD-10-CM | POA: Diagnosis present

## 2023-07-25 DIAGNOSIS — Z8249 Family history of ischemic heart disease and other diseases of the circulatory system: Secondary | ICD-10-CM | POA: Diagnosis not present

## 2023-07-25 DIAGNOSIS — R45851 Suicidal ideations: Secondary | ICD-10-CM | POA: Diagnosis present

## 2023-07-25 DIAGNOSIS — F17290 Nicotine dependence, other tobacco product, uncomplicated: Secondary | ICD-10-CM | POA: Diagnosis present

## 2023-07-25 DIAGNOSIS — Z5941 Food insecurity: Secondary | ICD-10-CM

## 2023-07-25 DIAGNOSIS — E785 Hyperlipidemia, unspecified: Secondary | ICD-10-CM | POA: Diagnosis present

## 2023-07-25 DIAGNOSIS — F0634 Mood disorder due to known physiological condition with mixed features: Secondary | ICD-10-CM | POA: Diagnosis not present

## 2023-07-25 DIAGNOSIS — Z811 Family history of alcohol abuse and dependence: Secondary | ICD-10-CM | POA: Diagnosis not present

## 2023-07-25 DIAGNOSIS — K219 Gastro-esophageal reflux disease without esophagitis: Secondary | ICD-10-CM | POA: Diagnosis present

## 2023-07-25 DIAGNOSIS — F3175 Bipolar disorder, in partial remission, most recent episode depressed: Secondary | ICD-10-CM | POA: Diagnosis not present

## 2023-07-25 DIAGNOSIS — F319 Bipolar disorder, unspecified: Principal | ICD-10-CM | POA: Diagnosis present

## 2023-07-25 DIAGNOSIS — Z635 Disruption of family by separation and divorce: Secondary | ICD-10-CM

## 2023-07-25 LAB — VITAMIN B12: Vitamin B-12: 780 pg/mL (ref 180–914)

## 2023-07-25 LAB — HEMOGLOBIN A1C
Hgb A1c MFr Bld: 4.7 % — ABNORMAL LOW (ref 4.8–5.6)
Mean Plasma Glucose: 88.19 mg/dL

## 2023-07-25 LAB — TSH: TSH: 0.722 u[IU]/mL (ref 0.350–4.500)

## 2023-07-25 LAB — VITAMIN D 25 HYDROXY (VIT D DEFICIENCY, FRACTURES): Vit D, 25-Hydroxy: 37.7 ng/mL (ref 30–100)

## 2023-07-25 MED ORDER — AMITRIPTYLINE HCL 25 MG PO TABS
25.0000 mg | ORAL_TABLET | Freq: Every day | ORAL | Status: DC
  Administered 2023-07-26 – 2023-08-02 (×8): 25 mg via ORAL
  Filled 2023-07-25 (×10): qty 1

## 2023-07-25 MED ORDER — TRAZODONE HCL 100 MG PO TABS
200.0000 mg | ORAL_TABLET | Freq: Every evening | ORAL | Status: DC | PRN
  Administered 2023-07-25 – 2023-08-02 (×9): 200 mg via ORAL
  Filled 2023-07-25 (×11): qty 2

## 2023-07-25 MED ORDER — SUMATRIPTAN SUCCINATE 25 MG PO TABS: 25.0000 mg | ORAL_TABLET | ORAL | Status: DC | PRN

## 2023-07-25 MED ORDER — TRAZODONE HCL 50 MG PO TABS: 50.0000 mg | ORAL_TABLET | Freq: Every evening | ORAL | Status: DC | PRN

## 2023-07-25 MED ORDER — DIPHENHYDRAMINE HCL 50 MG/ML IJ SOLN: 50.0000 mg | Freq: Three times a day (TID) | INTRAMUSCULAR | Status: DC | PRN

## 2023-07-25 MED ORDER — LORAZEPAM 2 MG/ML IJ SOLN: 2.0000 mg | Freq: Three times a day (TID) | INTRAMUSCULAR | Status: DC | PRN

## 2023-07-25 MED ORDER — LAMOTRIGINE 100 MG PO TABS
100.0000 mg | ORAL_TABLET | Freq: Two times a day (BID) | ORAL | Status: DC
  Administered 2023-07-25 – 2023-08-03 (×18): 100 mg via ORAL
  Filled 2023-07-25 (×22): qty 1

## 2023-07-25 MED ORDER — ACETAMINOPHEN 325 MG PO TABS
650.0000 mg | ORAL_TABLET | Freq: Four times a day (QID) | ORAL | Status: DC | PRN
  Administered 2023-07-25 – 2023-08-03 (×4): 650 mg via ORAL
  Filled 2023-07-25 (×4): qty 2

## 2023-07-25 MED ORDER — HALOPERIDOL LACTATE 5 MG/ML IJ SOLN: 10.0000 mg | Freq: Three times a day (TID) | INTRAMUSCULAR | Status: DC | PRN

## 2023-07-25 MED ORDER — HALOPERIDOL LACTATE 5 MG/ML IJ SOLN: 5.0000 mg | Freq: Three times a day (TID) | INTRAMUSCULAR | Status: DC | PRN

## 2023-07-25 MED ORDER — HYDROXYZINE HCL 25 MG PO TABS
25.0000 mg | ORAL_TABLET | Freq: Three times a day (TID) | ORAL | Status: DC | PRN
  Administered 2023-07-25 – 2023-08-02 (×11): 25 mg via ORAL
  Filled 2023-07-25 (×12): qty 1

## 2023-07-25 MED ORDER — DIPHENHYDRAMINE HCL 25 MG PO CAPS: 50.0000 mg | ORAL_CAPSULE | Freq: Three times a day (TID) | ORAL | Status: DC | PRN

## 2023-07-25 MED ORDER — MAGNESIUM HYDROXIDE 400 MG/5ML PO SUSP: 30.0000 mL | Freq: Every day | ORAL | Status: DC | PRN

## 2023-07-25 MED ORDER — HYDROXYZINE HCL 25 MG PO TABS
ORAL_TABLET | ORAL | Status: AC
  Filled 2023-07-25: qty 1

## 2023-07-25 MED ORDER — PROPRANOLOL HCL ER 80 MG PO CP24
80.0000 mg | ORAL_CAPSULE | Freq: Every day | ORAL | Status: DC
Start: 2023-07-26 — End: 2023-08-03
  Administered 2023-07-26 – 2023-08-03 (×8): 80 mg via ORAL
  Filled 2023-07-25 (×11): qty 1

## 2023-07-25 MED ORDER — HALOPERIDOL 5 MG PO TABS: 5.0000 mg | ORAL_TABLET | Freq: Three times a day (TID) | ORAL | Status: DC | PRN

## 2023-07-25 MED ORDER — ENSURE ENLIVE PO LIQD
237.0000 mL | Freq: Two times a day (BID) | ORAL | Status: DC
  Administered 2023-07-26 – 2023-07-28 (×5): 237 mL via ORAL
  Filled 2023-07-25 (×10): qty 237

## 2023-07-25 MED ORDER — GABAPENTIN 300 MG PO CAPS
300.0000 mg | ORAL_CAPSULE | Freq: Every day | ORAL | Status: DC
  Administered 2023-07-26 – 2023-07-28 (×3): 300 mg via ORAL
  Filled 2023-07-25 (×5): qty 1

## 2023-07-25 MED ORDER — ALUM & MAG HYDROXIDE-SIMETH 200-200-20 MG/5ML PO SUSP: 30.0000 mL | ORAL | Status: DC | PRN

## 2023-07-25 NOTE — [facility] Group Notes (Signed)

## 2023-07-25 NOTE — Plan of Care (Signed)
 Nurse discussed anxiety, depression and coping skills with patient.

## 2023-07-25 NOTE — H&P (Signed)
 Psychiatric Admission Assessment Adult  Patient Identification: Joel Carr MRN:  [id number] Date of Evaluation:  07/25/2023 Chief Complaint:  Bipolar disorder [facility]) [F31.9] Principal Diagnosis: Bipolar disorder (HCC) Diagnosis:  Principal Problem:   Bipolar disorder (HCC)  CC: "Suicidal thoughts due to my wife saying she wants a divorce. "  History of Present Illness: Joel Carr is a 38 y.o. Caucasian male with prior psychiatric history significant for bipolar 1 disorder depressed and anxiety who presents voluntarily to [facility] ED for worsening depression resulting in suicidal ideation in the context of being in the process of divorce by the wife of 17 years. After medical evaluation / stabilization & clearance, he was transferred to the [facility] for further psychiatric evaluation & treatments.   During this evaluation, patient report saying that he would rather die than getting a divorce from his wife. He reports a previous psychiatric hospitalization due to suicidal ideations but states that his symptoms today are worse.  Patient reports that he does not have suicidal plan or intent however, was only shocked by his wife plans to divorce him.  Per his record, patient has a medical history of GERD, chronic pain, arthritis, hyperlipidemia, restless leg syndrome, asthma and migraines.  He reports a psychiatric history of anxiety, insomnia, bipolar disorder with last manic symptoms 1 year ago.  He reports being established with a psychiatric provider, [facility] [place]  for the past couple of years.  Patient reports being diagnosed with bipolar disorder about 17 to 19 years ago.  He reports being prescribed Lamictal , gabapentin , valuim PRN, amitriptyline, and trazodone but unsure of dosages.  He reports medication compliance and denies adverse medication effects.  Patient reports also being established with a  therapist and states that he has his next appointment today, however, unable to obtain due to hospitalization.  He reports sleeping approximately 8 hours over the past week due to being anxious with the divorce issue.  Patient reports drinking alcohol, stating that he drinks 1 to 2 corona per day.  Patient reports last beer was yesterday.  He denies illicit substance use although, UDS is positive for cocaine metabolite, benzos, patient is on Valium as needed and tricyclic, patient is on amitriptyline. Patient denies suicidal ideation, homicidal ideations, paranoia, delusional thought, auditory hallucinations / visual hallucination. Patient reports a history of marijuana use but no recent use.  Mode of transport to Hospital: Police officer's vehicle Current Outpatient (Home) Medication List: See home medication list PRN medication prior to evaluation: See home medication list  ED course: Labs and EKG will obtain and analyze. Collateral Information: No collateral information obtained at this time POA/Legal Guardian: Patient is his own legal guardian To Past Psychiatric Hx: Information collected from patient, ED treatment Prev Dx/Sx: bipolar disorder Current Psych Provider: Yes, see above Home Meds (current): Lamictal .  See above Previous Med Trials: denies Therapy: yes see above Prior Psych Hospitalization: yes. 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 [employer] Hx: denies Living Situation: lives with wife and two children, son 18 yo and daughter 62 yo Spiritual Hx: none Access to weapons/lethal means: no    Past Medical History: Medical Diagnoses: Makes hyperlipidemia, nodule of skin of abdomen, GERD, tension headaches, dyspnea on examination, chronic arthritis pain, and asthma Home Rx: Not indicated Prior Hosp:  Yes, for status post surgery to feet Prior Surgeries/Trauma: Head trauma, LOC, concussions, seizures: Denies history of seizures Allergies:            Allergen Types Reaction Severity Reaction Type Noted Updated           Allergies     Tramadol   Drug Ingredient Other (See Comments) Medium  01/05/2017 Past Updates  Jittery Other Reaction(s): confusion         LMP: Not applicable Contraception: Not applicable PCP: Yes  Family History: Medical: Unknown Psych: Maternal great uncle, 3 cousins on the maternal side of the family has history of suicide Psych Rx: Unknown SA/HA: Unknown Substance use family hx: Maternal side of the family have history of alcoholics  Social History: Childhood (bring, raised, lives now, parents, siblings, schooling, education): Completed GED Abuse: Denies history of abuse Marital Status: Married, however going through a divorce at this time Sexual orientation: Male from birth Children: 2 children ages 50 and 31 Employment: Currently unemployed Peer Group: Denies peer group  Housing:living with a friend Finances: Some Water quality scientist: Denies Hotel manager: Denies  (Hypo) Manic Symptoms:  Last manic episode 1 year ago Anxiety Symptoms:  Excessive Worry, Psychotic Symptoms:   N/A PTSD Symptoms: NA Total Time spent with patient: 1.5 hours  Is the patient at risk to self? Yes.    Has the patient been a risk to self in the past 6 months? No.  Has the patient been a risk to self within the distant past? No.  Is the patient a risk to others? No.  Has the patient been a risk to others in the past 6 months? No.  Has the patient been a risk to others within the distant past? No.   [place] Scale:  Flowsheet Row [facility]) [facility] 07/24/2023 in [facility] Department at [facility] Admission (Discharged) from 01/13/2023 in [facility] SUR[facility]  PERIOP  C-SSRS RISK CATEGORY High Risk High Risk No Risk      Alcohol Screening: 1. How often do you have a drink containing alcohol?: 2 to 3 times a week 2. How many drinks containing alcohol do you have on a typical day when you are drinking?: 3 or 4 3. How often do you have six or more drinks on one occasion?: Less than monthly AUDIT-C Score: 5 4. How often during the last year have you found that you were not able to stop drinking once you had started?: Never 5. How often during the last year have you failed to do what was normally expected from you because of drinking?: Never 6. How often during the last year have you needed a first drink in the morning to get yourself going after a heavy drinking session?: Never 7. How often during the last year have you had a feeling of guilt of remorse after drinking?: Never 8. How often during the last year have you been unable to remember what happened the night before because you had been drinking?: Never 9. Have you or someone else been injured as a result of your drinking?: No 10. Has a relative or friend or a doctor or another health worker been concerned about your drinking or suggested you cut down?: No Alcohol Use Disorder Identification Test Final Score (AUDIT): 5 Alcohol Brief Interventions/Follow-up: Patient Refused Substance Abuse History in the last 12 months:  Yes.   Consequences of Substance Abuse: Discussed with patient during this admission  evaluation.  Medical Consequences: Liver damage, Possible death by overdose  Legal Consequences: Arrests, jail time, Loss of driving privilege.  Family Consequences: Family discord, divorce and or separation.  Previous Psychotropic Medications: Yes  Psychological Evaluations: Yes  Past Medical History:  Past Medical History:  Diagnosis Date   Anxiety    Bipolar disorder (HCC)    Chronic pain syndrome    COVID-19 virus infection 12/23/2019   mild congestion and body aches   Depression     History of kidney stones    History of migraine    Hyperlipidemia    Pneumonia    RLS (restless legs syndrome)     Past Surgical History:  Procedure Laterality Date   ARTHRODESIS METATARSAL Left 09/19/2020   Procedure: ARTHRODESIS METATARSAL; LISFRANC MULTIPLE-1/2/3;  Surgeon: Pink Bridges, DPM;  Location: [facility] ORS;  Service: Podiatry;  Laterality: Left;   ARTHRODESIS METATARSAL Left 01/13/2023   Procedure: ARTHRODESIS; LISFRANC; MULTIPLE FOURTH AND FIFTH;  Surgeon: Pink Bridges, DPM;  Location: [facility];  Service: Orthopedics/Podiatry;  Laterality: Left;   FOOT ARTHRODESIS Left 09/19/2020   Procedure: ARTHRODESIS FOOT- *POSSIBLE BONE GRAFT;  Surgeon: Pink Bridges, DPM;  Location: [facility] ORS;  Service: Podiatry;  Laterality: Left;   FOOT ARTHRODESIS Left 01/13/2023   Procedure: AUTOGRAFT HARVEST;  Surgeon: Pink Bridges, DPM;  Location: [facility];  Service: Orthopedics/Podiatry;  Laterality: Left;   KNEE ARTHROSCOPY WITH MENISCAL REPAIR Right 10/29/2014   Procedure: KNEE ARTHROSCOPY WITH MENISCAL REPAIR;  Surgeon: Molli Angelucci, MD;  Location: [facility] ORS;  Service: Orthopedics;  Laterality: Right;   OPEN REDUCTION INTERNAL FIXATION (ORIF) FOOT LISFRANC FRACTURE Left 09/19/2020   Procedure: OPEN REDUCTION INTERNAL FIXATION (ORIF) FOOT LISFRANC FRACTURE x2 4/5  PERCUTANEOUS SKELETAL FIXATION OF TARSOMETATARSAL JOINT DISLOCATION WITH MANIPULATION, X2 4/5;  Surgeon: Pink Bridges, DPM;  Location: [facility] ORS;  Service: Podiatry;  Laterality: Left;   TESTICLE SURGERY N/A    as a child   TYMPANOPLASTY Bilateral 1990   Family History:  Family History  Problem Relation Age of Onset   Depression Father    Alcohol abuse Father    Diabetes Paternal Grandfather    Heart attack Paternal Grandfather    Colon cancer Neg Hx    Tobacco Screening:  Social History   Tobacco Use  Smoking Status Every Day   Types: Cigars, E-cigarettes  Smokeless Tobacco Never  Tobacco Comments   3  cig daily-04/29/2020    BH Tobacco Counseling     Are you interested in Tobacco Cessation Medications?  No value filed. Counseled patient on smoking cessation:  No value filed. Reason Tobacco Screening Not Completed: No value filed.    Social History:  Social History   Substance and Sexual Activity  Alcohol Use Not Currently   Comment: rarely     Social History   Substance and Sexual Activity  Drug Use No    Additional Social History:    Allergies:   Allergies  Allergen Reactions   Tramadol  Other (See Comments)    Jittery  Other Reaction(s): confusion   Lab Results:  Results for orders placed or performed during the hospital encounter of 07/24/23 (from the past 48 hours)  Comprehensive metabolic panel     Status: Abnormal   Collection Time: 07/24/23 11:53 AM  Result Value Ref Range   Sodium 135 135 - 145 mmol/L   Potassium 3.2 (L) 3.5 - 5.1 mmol/L   Chloride 104 98 - 111 mmol/L   CO2 25 22 - 32 mmol/L  Glucose, Bld 132 (H) 70 - 99 mg/dL    Comment: Glucose reference range applies only to samples taken after fasting for at least 8 hours.   BUN 5 (L) 6 - 20 mg/dL   Creatinine, Ser 0.45 0.61 - 1.24 mg/dL   Calcium  9.0 8.9 - 10.3 mg/dL   Total Protein 6.1 (L) 6.5 - 8.1 g/dL   Albumin 3.6 3.5 - 5.0 g/dL   AST 19 15 - 41 U/L   ALT 10 0 - 44 U/L   Alkaline Phosphatase 37 (L) 38 - 126 U/L   Total Bilirubin 0.5 0.0 - 1.2 mg/dL   GFR, Estimated >40 >98 mL/min    Comment: (NOTE) Calculated using the CKD-EPI Creatinine Equation (2021)    Anion gap 6 5 - 15    Comment: Performed at [facility], [address] [place], [place] [address]  Ethanol     Status: None   Collection Time: 07/24/23 11:53 AM  Result Value Ref Range   Alcohol, Ethyl (B) <15 <15 mg/dL    Comment: Please note change in reference range. (NOTE) For medical purposes only. Performed at [facility], [address] [place], [place] [address]   Salicylate level     Status:  Abnormal   Collection Time: 07/24/23 11:53 AM  Result Value Ref Range   Salicylate Lvl <7.0 (L) 7.0 - 30.0 mg/dL    Comment: Performed at [facility], [address] [place], [place] [address]  Acetaminophen  level     Status: Abnormal   Collection Time: 07/24/23 11:53 AM  Result Value Ref Range   Acetaminophen  (Tylenol ), Serum <10 (L) 10 - 30 ug/mL    Comment: (NOTE) Therapeutic concentrations vary significantly. A range of 10-30 ug/mL  may be an effective concentration for many patients. However, some  are best treated at concentrations outside of this range. Acetaminophen  concentrations >150 ug/mL at 4 hours after ingestion  and >50 ug/mL at 12 hours after ingestion are often associated with  toxic reactions.  Performed at [facility], [address] [place], [place] [address]   cbc     Status: None   Collection Time: 07/24/23 11:53 AM  Result Value Ref Range   WBC 7.8 4.0 - 10.5 K/uL   RBC 4.71 4.22 - 5.81 MIL/uL   Hemoglobin 14.9 13.0 - 17.0 g/dL   HCT 46.9 62.9 - 52.8 %   MCV 90.4 80.0 - 100.0 fL   MCH 31.6 26.0 - 34.0 pg   MCHC 35.0 30.0 - 36.0 g/dL   RDW 41.3 24.4 - 01.0 %   Platelets 302 150 - 400 K/uL   nRBC 0.0 0.0 - 0.2 %    Comment: Performed at [facility], [address] [place], [place] [address]  Urine Drug Screen, Qualitative     Status: Abnormal   Collection Time: 07/24/23 11:53 AM  Result Value Ref Range   Tricyclic, Ur Screen POSITIVE (A) NONE DETECTED   Amphetamines, Ur Screen NONE DETECTED NONE DETECTED   MDMA (Ecstasy)Ur Screen NONE DETECTED NONE DETECTED   Cocaine Metabolite,Ur [place] POSITIVE (A) NONE DETECTED   Opiate, Ur Screen NONE DETECTED NONE DETECTED   Phencyclidine (PCP) Ur S NONE DETECTED NONE DETECTED   Cannabinoid 50 Ng, Ur [place] NONE DETECTED NONE DETECTED   Barbiturates, Ur Screen NONE DETECTED NONE DETECTED   Benzodiazepine, Ur Scrn POSITIVE (A) NONE DETECTED   Methadone Scn, Ur NONE DETECTED NONE  DETECTED    Comment: (NOTE) Tricyclics +  metabolites, urine    Cutoff 1000 ng/mL Amphetamines + metabolites, urine  Cutoff 1000 ng/mL MDMA (Ecstasy), urine              Cutoff 500 ng/mL Cocaine Metabolite, urine          Cutoff 300 ng/mL Opiate + metabolites, urine        Cutoff 300 ng/mL Phencyclidine (PCP), urine         Cutoff 25 ng/mL Cannabinoid, urine                 Cutoff 50 ng/mL Barbiturates + metabolites, urine  Cutoff 200 ng/mL Benzodiazepine, urine              Cutoff 200 ng/mL Methadone, urine                   Cutoff 300 ng/mL  The urine drug screen provides only a preliminary, unconfirmed analytical test result and should not be used for non-medical purposes. Clinical consideration and professional judgment should be applied to any positive drug screen result due to possible interfering substances. A more specific alternate chemical method must be used in order to obtain a confirmed analytical result. Gas chromatography / mass spectrometry (GC/MS) is the preferred confirm atory method. Performed at [facility], [address] [place], [place] [address]    Blood Alcohol level:  Lab Results  Component Value Date   [facility] <15 07/24/2023   ETH <10 08/20/2020   Metabolic Disorder Labs:  Lab Results  Component Value Date   HGBA1C 4.9 08/08/2020   No results found for: "PROLACTIN" Lab Results  Component Value Date   CHOL 230 (H) 02/17/2023   TRIG 250.0 (H) 02/17/2023   HDL 42.30 02/17/2023   CHOLHDL 5 02/17/2023   VLDL 50.0 (H) 02/17/2023   LDLCALC 138 (H) 02/17/2023   LDLCALC 130 (H) 06/12/2021   Current Medications: Current Facility-Administered Medications  Medication Dose Route Frequency Provider Last Rate Last Admin   acetaminophen  (TYLENOL ) tablet 650 mg  650 mg Oral Q6H PRN Ajibola, Ene A, NP       alum & mag hydroxide-simeth (MAALOX/MYLANTA) 200-200-20 MG/5ML suspension 30 mL  30 mL Oral Q4H PRN Ajibola, Ene A, NP       haloperidol  (HALDOL) tablet 5 mg  5 mg Oral TID PRN Ajibola, Ene A, NP       And   diphenhydrAMINE (BENADRYL) capsule 50 mg  50 mg Oral TID PRN Ajibola, Ene A, NP       haloperidol lactate (HALDOL) injection 5 mg  5 mg Intramuscular TID PRN Ajibola, Ene A, NP       And   diphenhydrAMINE (BENADRYL) injection 50 mg  50 mg Intramuscular TID PRN Ajibola, Ene A, NP       And   LORazepam (ATIVAN) injection 2 mg  2 mg Intramuscular TID PRN Ajibola, Ene A, NP       haloperidol lactate (HALDOL) injection 10 mg  10 mg Intramuscular TID PRN Ajibola, Ene A, NP       And   diphenhydrAMINE (BENADRYL) injection 50 mg  50 mg Intramuscular TID PRN Ajibola, Ene A, NP       And   LORazepam (ATIVAN) injection 2 mg  2 mg Intramuscular TID PRN Ajibola, Ene A, NP       hydrOXYzine (ATARAX) tablet 25 mg  25 mg Oral TID PRN Ajibola, Ene A, NP   25 mg at 07/25/23 0332   magnesium  hydroxide (  MILK OF MAGNESIA) suspension 30 mL  30 mL Oral Daily PRN Ajibola, Ene A, NP       traZODone (DESYREL) tablet 50 mg  50 mg Oral QHS PRN Ajibola, Ene A, NP       PTA Medications: Medications Prior to Admission  Medication Sig Dispense Refill Last Dose/Taking   amitriptyline (ELAVIL) 25 MG tablet Take 25 mg by mouth at bedtime.   07/24/2023   gabapentin  (NEURONTIN ) 300 MG capsule Take 1 capsule (300 mg total) by mouth at bedtime. 30 capsule 3 07/24/2023   lamoTRIgine  (LAMICTAL ) 100 MG tablet Take 1 tablet (100 mg total) by mouth 2 (two) times daily. 180 tablet 0 07/24/2023   traZODone (DESYREL) 100 MG tablet Take 200 mg by mouth at bedtime as needed for sleep.   07/24/2023   propranolol  ER (INDERAL  LA) 80 MG 24 hr capsule Take 1 capsule (80 mg total) by mouth daily. 90 capsule 3 Unknown   SUMAtriptan  (IMITREX ) 25 MG tablet Take 1 tablet by mouth at onset of headache. May repeat in 2 hours if headache persists or recurs. Max 100 mg/24 hours. (Patient taking differently: Take 25 mg by mouth every 2 (two) hours as needed for migraine or headache. Take  1 tablet by mouth at onset of headache. May repeat in 2 hours if headache persists or recurs. Max 100 mg/24 hours.) 10 tablet 5 Unknown   Musculoskeletal: Strength & Muscle Tone: within normal limits Gait & Station: normal Patient leans: N/A  Psychiatric Specialty Exam:  Presentation  General Appearance:  Appropriate for Environment; Casual; Fairly Groomed  Eye Contact: Fair  Speech: Clear and Coherent  Speech Volume: Normal  Handedness: Right  Mood and Affect  Mood: Anxious; Depressed  Affect: Congruent  Thought Process  Thought Processes: Coherent  Duration of Psychotic Symptoms:N/A Past Diagnosis of Schizophrenia or Psychoactive disorder: No  Descriptions of Associations:Intact  Orientation:Full (Time, Place and Person)  Thought Content:Logical  Hallucinations:Hallucinations: None  Ideas of Reference:None  Suicidal Thoughts:Suicidal Thoughts: No  Homicidal Thoughts:Homicidal Thoughts: No  Sensorium  Memory: Immediate Good; Recent Good  Judgment: Fair  Insight: Fair  Art therapist  Concentration: Good  Attention Span: Good  Recall: Fair  Fund of Knowledge: Fair  Language: Good  Psychomotor Activity  Psychomotor Activity: Psychomotor Activity: Normal  Assets  Assets: Communication Skills; Physical Health; Resilience  Sleep  Sleep: Sleep: Fair Number of Hours of Sleep: 4  Physical Exam: Physical Exam Vitals reviewed.  Constitutional:      Appearance: Normal appearance.  HENT:     Head: Normocephalic.     Nose: Nose normal.     Mouth/Throat:     Mouth: Mucous membranes are moist.     Pharynx: Oropharynx is clear.  Eyes:     Extraocular Movements: Extraocular movements intact.  Cardiovascular:     Rate and Rhythm: Normal rate.     Pulses: Normal pulses.  Pulmonary:     Effort: Pulmonary effort is normal.  Abdominal:     Comments: Deferred  Genitourinary:    Comments: Deferred Musculoskeletal:         General: Normal range of motion.     Cervical back: Normal range of motion.  Skin:    General: Skin is warm.  Neurological:     General: No focal deficit present.     Mental Status: He is alert and oriented to person, place, and time.  Psychiatric:        Mood and Affect: Mood normal.  Behavior: Behavior normal.    Review of Systems  Constitutional:  Negative for chills and fever.  HENT:  Negative for sore throat.   Eyes:  Negative for blurred vision.  Respiratory:  Negative for cough, sputum production, shortness of breath and wheezing.   Cardiovascular:  Negative for chest pain and palpitations.  Gastrointestinal:  Positive for heartburn (History of GERD). Negative for abdominal pain, constipation, diarrhea, nausea and vomiting.  Genitourinary:  Negative for dysuria, frequency and urgency.  Musculoskeletal:  Positive for joint pain (History of multiple joints surgery). Negative for falls.  Skin:  Negative for itching and rash.  Neurological:  Negative for dizziness, tingling, tremors and headaches.  Endo/Heme/Allergies:        See allergy listing  Psychiatric/Behavioral:  Positive for depression. Negative for hallucinations, substance abuse and suicidal ideas. The patient is nervous/anxious.    Blood pressure 93/65, pulse 66, temperature 97.9 F (36.6 C), temperature source Oral, resp. rate 16, height 5\' 8"  (1.727 m), weight 67.1 kg, SpO2 100%. Body mass index is 22.5 kg/m.  Treatment Plan Summary: Daily contact with patient to assess and evaluate symptoms and progress in treatment and Medication management  Physician Treatment Plan for Primary Diagnosis: Bipolar disorder (HCC) Long Term Goal(s): Improvement in symptoms so as ready for discharge  Short Term Goals: Ability to identify changes in lifestyle to reduce recurrence of condition will improve, Ability to verbalize feelings will improve, Ability to disclose and discuss suicidal ideas, Ability to demonstrate  self-control will improve, Ability to identify and develop effective coping behaviors will improve, Ability to maintain clinical measurements within normal limits will improve, Compliance with prescribed medications will improve, and Ability to identify triggers associated with substance abuse/mental health issues will improve  Physician Treatment Plan for Secondary Diagnosis: Assessment: Joel Carr is a 38 y.o. Caucasian male with prior psychiatric history significant for bipolar 1 disorder depressed and anxiety who [facility] ED for worsening depression resulting in suicidal ideation in the context of being in the process of divorce by the wife of 17 years.   Principal Problem:   Bipolar disorder (HCC)  Plans: Medications: Continue home medications -- Amitriptyline 25 mg tablets p.o. at bedtime for depression -- Gabapentin  300 mg capsule p.o. at bedtime for neuropathy -- Lamictal  100 mg tablets 1 p.o. 2 times daily for bipolar -- Trazodone 100 mg tablets at bedtime for sleep as needed  Medications for other medical problems: -- Propranolol  ER 80 mg 24-hour capsule p.o. daily for blood pressure -- Imitrex  25 mg tablets may repeat in 2 hours for a maximum of 100 mg every 24 hours for Tension headache  Other PRN Medications  -Acetaminophen  650 mg every 6 as needed/mild pain  -Maalox 30 mL oral every 4 as needed/digestion  -Magnesium  hydroxide 30 mL daily as needed/mild constipation   Admission lab reviewed: CMP: Potassium 3.2 low, glucose 132 high, BUN 5 low, alkaline phosphatase 37 low, total protein 6.1 low, otherwise normal.  Lipid profile: Cholesterol 230 high, LDL 138 high, triglyceride 250 high, VLDL 50.  Otherwise normal.  CBC with differential: Within normal limits.  UDS: Positive for cocaine metabolites, positive for benzodiazepines, positive for tricyclic urine screen.  TSH: 1.29, within normal limits  New labs  ordered: Hemoglobin A1c, vitamin B12, 25 hydroxyvitamin D,  EKG reviewed: Normal sinus rhythm, ventricular rate 63, QT/QTc 394/403  Continue BH Agitation Protocol  --Haldol 5 mg, oral, 3 times daily as needed, mild agitation  --  Benadryl 50 mg, oral, 3 times daily as needed, mild agitation                                    OR   --Haldol injection 5 mg, IM, 3 times daily as needed, moderate agitation  --Benadryl injection 50 mg, IM, 3 times daily as needed, moderate agitation  --Ativan injection 2 mg, IM, 3 times daily as needed, moderate agitation                                      OR  --Haldol injection 10 mg, IM, 3 times daily as needed, severe agitation  --Benadryl injection 50 mg, IM, 3 times daily as needed, severe agitation  --Ativan injection 2 mg, IM, 3 times daily as needed, severe agitation   --  The risks/benefits/side-effects/alternatives to this medication were discussed in detail with the patient and time was given for questions. The patient consents to medication trial.   -- Metabolic profile and EKG monitoring obtained while on an atypical antipsychotic (BMI: Lipid Panel: HbgA1c: QTc:)   -- Encouraged patient to participate in unit milieu and in scheduled group therapies    Safety and Monitoring:  Voluntary admission to inpatient psychiatric unit for safety, stabilization and treatment  Daily contact with patient to assess and evaluate symptoms and progress in treatment  Patient's case to be discussed in multi-disciplinary team meeting  Observation Level : q15 minute checks  Vital signs: q12 hours  Precautions: suicide, but pt currently verbally contracts for safety on unit?   I certify that inpatient services furnished can reasonably be expected to improve the patient's condition.    Yuktha Kerchner C Natash Berman, FNP 4/28/202511:58 AM

## 2023-07-25 NOTE — Group Note (Signed)
 Date:  07/25/2023 Time:  9:23 AM  Group Topic/Focus:  Goals Group:   The focus of this group is to help patients establish daily goals to achieve during treatment and discuss how the patient can incorporate goal setting into their daily lives to aide in recovery.    Participation Level:  Did Not Attend   Joel Carr 07/25/2023, 9:23 AM

## 2023-07-25 NOTE — [facility] Suicide Risk Assessment (Signed)
 Suicide Risk Assessment  Admission Assessment    [facility] Admission Suicide Risk Assessment   Nursing information obtained from:  Patient Demographic factors:  Male, Caucasian Current Mental Status:  Suicidal ideation indicated by patient Loss Factors:  Loss of significant relationship Historical Factors:  Family history of mental illness or substance abuse Risk Reduction Factors:  Responsible for children under 39 years of age, Employed  Total Time spent with patient: 1.5 hours Principal Problem: Bipolar disorder (HCC) Diagnosis:  Principal Problem:   Bipolar disorder (HCC)  Subjective Data: Joel Carr is a 38 y.o. Caucasian male with prior psychiatric history significant for bipolar 1 disorder depressed and anxiety who presents voluntarily to [facility] ED for worsening depression resulting in suicidal ideation in the context of being in the process of divorce by the wife of 17 years.  Continued Clinical Symptoms:  Alcohol Use Disorder Identification Test Final Score (AUDIT): 5 The "Alcohol Use Disorders Identification Test", Guidelines for Use in Primary Care, Second Edition.  World Science writer [facility]). Score between 0-7:  no or low risk or alcohol related problems. Score between 8-15:  moderate risk of alcohol related problems. Score between 16-19:  high risk of alcohol related problems. Score 20 or above:  warrants further diagnostic evaluation for alcohol dependence and treatment.  CLINICAL FACTORS:   Severe Anxiety and/or Agitation Bipolar Disorder:   Depressive phase Depression:   Anhedonia Hopelessness Insomnia More than one psychiatric diagnosis Unstable or Poor Therapeutic Relationship Previous Psychiatric Diagnoses and Treatments Medical Diagnoses and Treatments/Surgeries  Musculoskeletal: Strength & Muscle Tone: within normal limits Gait & Station: normal Patient leans: N/A  Psychiatric Specialty  Exam:  Presentation  General Appearance:  Appropriate for Environment; Casual; Fairly Groomed  Eye Contact: Fair  Speech: Clear and Coherent  Speech Volume: Normal  Handedness: Right  Mood and Affect  Mood: Anxious; Depressed  Affect: Congruent  Thought Process  Thought Processes: Coherent  Descriptions of Associations:Intact  Orientation:Full (Time, Place and Person)  Thought Content:Logical  History of Schizophrenia/Schizoaffective disorder:No  Duration of Psychotic Symptoms:No data recorded Hallucinations:Hallucinations: None  Ideas of Reference:None  Suicidal Thoughts:Suicidal Thoughts: No  Homicidal Thoughts:Homicidal Thoughts: No  Sensorium  Memory: Immediate Good; Recent Good  Judgment: Fair  Insight: Fair  Art therapist  Concentration: Good  Attention Span: Good  Recall: Fair  Fund of Knowledge: Fair  Language: Good   Psychomotor Activity  Psychomotor Activity: Psychomotor Activity: Normal  Assets  Assets: Communication Skills; Physical Health; Resilience  Sleep  Sleep: Sleep: Fair Number of Hours of Sleep: 4  Physical Exam: Physical Exam Vitals and nursing note reviewed.  Constitutional:      Appearance: He is normal weight.  HENT:     Head: Normocephalic.     Right Ear: External ear normal.     Left Ear: External ear normal.     Nose: Nose normal.     Mouth/Throat:     Mouth: Mucous membranes are moist.     Pharynx: Oropharynx is clear.  Eyes:     Extraocular Movements: Extraocular movements intact.  Cardiovascular:     Rate and Rhythm: Normal rate.     Pulses: Normal pulses.  Pulmonary:     Effort: Pulmonary effort is normal.  Abdominal:     Comments: Deferred  Genitourinary:    Comments: Deferred Musculoskeletal:        General: Normal range of motion.     Cervical back: Normal range of motion.  Skin:  General: Skin is warm.  Neurological:     General: No focal deficit present.      Mental Status: He is alert and oriented to person, place, and time.  Psychiatric:        Mood and Affect: Mood normal.        Behavior: Behavior normal.    Review of Systems  Constitutional:  Negative for chills and fever.  HENT:  Negative for sore throat.   Eyes:  Negative for blurred vision.  Respiratory:  Negative for cough, sputum production, shortness of breath and wheezing.   Cardiovascular:  Negative for chest pain.  Gastrointestinal:  Positive for heartburn (Has history of GERD). Negative for abdominal pain, constipation, diarrhea, nausea and vomiting.  Genitourinary:  Negative for dysuria, frequency and urgency.  Musculoskeletal:  Positive for joint pain (History of joint pain from status post surgry). Negative for falls and myalgias.  Skin:  Negative for itching and rash.  Neurological:  Negative for dizziness, tingling, tremors and headaches.  Endo/Heme/Allergies:        See allergy listing  Psychiatric/Behavioral:  Positive for depression. Negative for hallucinations, substance abuse and suicidal ideas. The patient is nervous/anxious.    Blood pressure 93/65, pulse 66, temperature 97.9 F (36.6 C), temperature source Oral, resp. rate 16, height 5\' 8"  (1.727 m), weight 67.1 kg, SpO2 100%. Body mass index is 22.5 kg/m.  COGNITIVE FEATURES THAT CONTRIBUTE TO RISK:  Polarized thinking    SUICIDE RISK:   Severe:  Frequent, intense, and enduring suicidal ideation, specific plan, no subjective intent, but some objective markers of intent (i.e., choice of lethal method), the method is accessible, some limited preparatory behavior, evidence of impaired self-control, severe dysphoria/symptomatology, multiple risk factors present, and few if any protective factors, particularly a lack of social support.  PLAN OF CARE: Treatment Plan Summary: Daily contact with patient to assess and evaluate symptoms and progress in treatment and Medication management  Physician Treatment Plan for  Primary Diagnosis: Bipolar disorder (HCC) Long Term Goal(s): Improvement in symptoms so as ready for discharge  Short Term Goals: Ability to identify changes in lifestyle to reduce recurrence of condition will improve, Ability to verbalize feelings will improve, Ability to disclose and discuss suicidal ideas, Ability to demonstrate self-control will improve, Ability to identify and develop effective coping behaviors will improve, Ability to maintain clinical measurements within normal limits will improve, Compliance with prescribed medications will improve, and Ability to identify triggers associated with substance abuse/mental health issues will improve  Physician Treatment Plan for Secondary Diagnosis: Assessment: Joel Carr is a 38 y.o. Caucasian male with prior psychiatric history significant for bipolar 1 disorder depressed and anxiety who presents [facility] ED for worsening depression resulting in suicidal ideation in the context of being in the process of divorce by the wife of 17 years.   Principal Problem:   Bipolar disorder (HCC)  Plans: Medications: Continue home medications -- Amitriptyline 25 mg tablets p.o. at bedtime for depression -- Gabapentin  300 mg capsule p.o. at bedtime for neuropathy -- Lamictal  100 mg tablets 1 p.o. 2 times daily for bipolar -- Trazodone 100 mg tablets at bedtime for sleep as needed  Medications for other medical problems: -- Propranolol  ER 80 mg 24-hour capsule p.o. daily for blood pressure -- Imitrex  25 mg tablets may repeat in 2 hours for a maximum of 100 mg every 24 hours for Tension headache  Other PRN Medications  -Acetaminophen  650 mg every  6 as needed/mild pain  -Maalox 30 mL oral every 4 as needed/digestion  -Magnesium  hydroxide 30 mL daily as needed/mild constipation   Admission lab reviewed: CMP: Potassium 3.2 low, glucose 132 high, BUN 5 low, alkaline phosphatase 37 low, total  protein 6.1 low, otherwise normal.  Lipid profile: Cholesterol 230 high, LDL 138 high, triglyceride 250 high, VLDL 50.  Otherwise normal.  CBC with differential: Within normal limits.  UDS: Positive for cocaine metabolites, positive for benzodiazepines, positive for tricyclic urine screen.  TSH: 1.29, within normal limits  New labs ordered: Hemoglobin A1c, vitamin B12, 25 hydroxyvitamin D,  EKG reviewed: Normal sinus rhythm, ventricular rate 63, QT/QTc 394/403  Continue BH Agitation Protocol  --Haldol 5 mg, oral, 3 times daily as needed, mild agitation  --Benadryl 50 mg, oral, 3 times daily as needed, mild agitation                                    OR   --Haldol injection 5 mg, IM, 3 times daily as needed, moderate agitation  --Benadryl injection 50 mg, IM, 3 times daily as needed, moderate agitation  --Ativan injection 2 mg, IM, 3 times daily as needed, moderate agitation                                      OR  --Haldol injection 10 mg, IM, 3 times daily as needed, severe agitation  --Benadryl injection 50 mg, IM, 3 times daily as needed, severe agitation  --Ativan injection 2 mg, IM, 3 times daily as needed, severe agitation   --  The risks/benefits/side-effects/alternatives to this medication were discussed in detail with the patient and time was given for questions. The patient consents to medication trial.   -- Metabolic profile and EKG monitoring obtained while on an atypical antipsychotic (BMI: Lipid Panel: HbgA1c: QTc:)   -- Encouraged patient to participate in unit milieu and in scheduled group therapies    Safety and Monitoring:  Voluntary admission to inpatient psychiatric unit for safety, stabilization and treatment  Daily contact with patient to assess and evaluate symptoms and progress in treatment  Patient's case to be discussed in multi-disciplinary team meeting  Observation Level : q15 minute checks  Vital signs: q12 hours  Precautions: suicide, but pt currently  verbally contracts for safety on unit?    I certify that inpatient services furnished can reasonably be expected to improve the patient's condition.   Laurence Pons, FNP 07/25/2023, 11:43 AM

## 2023-07-25 NOTE — BH IP Treatment Plan (Signed)
 Interdisciplinary Treatment and Diagnostic Plan Update  07/25/2023 Time of Session: 10:35 AM Joel Carr MRN: [id number]  Principal Diagnosis: Bipolar disorder [facility])  Secondary Diagnoses: Principal Problem:   Bipolar disorder (HCC)   Current Medications:  Current Facility-Administered Medications  Medication Dose Route Frequency Provider Last Rate Last Admin   acetaminophen  (TYLENOL ) tablet 650 mg  650 mg Oral Q6H PRN Ajibola, Ene A, NP   650 mg at 07/25/23 1623   alum & mag hydroxide-simeth (MAALOX/MYLANTA) 200-200-20 MG/5ML suspension 30 mL  30 mL Oral Q4H PRN Ajibola, Ene A, NP       [START ON 07/26/2023] amitriptyline (ELAVIL) tablet 25 mg  25 mg Oral QHS Ntuen, Tina C, FNP       haloperidol (HALDOL) tablet 5 mg  5 mg Oral TID PRN Ajibola, Ene A, NP       And   diphenhydrAMINE (BENADRYL) capsule 50 mg  50 mg Oral TID PRN Ajibola, Ene A, NP       haloperidol lactate (HALDOL) injection 5 mg  5 mg Intramuscular TID PRN Ajibola, Ene A, NP       And   diphenhydrAMINE (BENADRYL) injection 50 mg  50 mg Intramuscular TID PRN Ajibola, Ene A, NP       And   LORazepam (ATIVAN) injection 2 mg  2 mg Intramuscular TID PRN Ajibola, Ene A, NP       haloperidol lactate (HALDOL) injection 10 mg  10 mg Intramuscular TID PRN Ajibola, Ene A, NP       And   diphenhydrAMINE (BENADRYL) injection 50 mg  50 mg Intramuscular TID PRN Ajibola, Ene A, NP       And   LORazepam (ATIVAN) injection 2 mg  2 mg Intramuscular TID PRN Ajibola, Ene A, NP       feeding supplement (ENSURE ENLIVE / ENSURE PLUS) liquid 237 mL  237 mL Oral BID BM Zouev, Dmitri, MD       [START ON 07/26/2023] gabapentin  (NEURONTIN ) capsule 300 mg  300 mg Oral QHS Ntuen, Tina C, FNP       hydrOXYzine (ATARAX) tablet 25 mg  25 mg Oral TID PRN Ajibola, Ene A, NP   25 mg at 07/25/23 1630   lamoTRIgine  (LAMICTAL ) tablet 100 mg  100 mg Oral BID Ntuen, Tina C, FNP   100 mg at 07/25/23 1619   magnesium  hydroxide (MILK OF MAGNESIA) suspension 30 mL   30 mL Oral Daily PRN Ajibola, Ene A, NP       [START ON 07/26/2023] propranolol  ER (INDERAL  LA) 24 hr capsule 80 mg  80 mg Oral Daily Ntuen, Tina C, FNP       SUMAtriptan  (IMITREX ) tablet 25 mg  25 mg Oral Q2H PRN Ntuen, Tina C, FNP       traZODone (DESYREL) tablet 200 mg  200 mg Oral QHS PRN Ntuen, Tina C, FNP       PTA Medications: Medications Prior to Admission  Medication Sig Dispense Refill Last Dose/Taking   amitriptyline (ELAVIL) 25 MG tablet Take 25 mg by mouth at bedtime.   07/25/2023   gabapentin  (NEURONTIN ) 300 MG capsule Take 1 capsule (300 mg total) by mouth at bedtime. 30 capsule 3 07/25/2023   lamoTRIgine  (LAMICTAL ) 100 MG tablet Take 1 tablet (100 mg total) by mouth 2 (two) times daily. 180 tablet 0 07/25/2023   propranolol  ER (INDERAL  LA) 80 MG 24 hr capsule Take 1 capsule (80 mg total) by mouth daily. 90 capsule 3 07/25/2023  SUMAtriptan  (IMITREX ) 25 MG tablet Take 1 tablet by mouth at onset of headache. May repeat in 2 hours if headache persists or recurs. Max 100 mg/24 hours. (Patient taking differently: Take 25 mg by mouth every 2 (two) hours as needed for migraine or headache. Take 1 tablet by mouth at onset of headache. May repeat in 2 hours if headache persists or recurs. Max 100 mg/24 hours.) 10 tablet 5 07/25/2023   traZODone (DESYREL) 100 MG tablet Take 200 mg by mouth at bedtime as needed for sleep.   07/25/2023    Patient Stressors:    Patient Strengths:    Treatment Modalities: Medication Management, Group therapy, Case management,  1 to 1 session with clinician, Psychoeducation, Recreational therapy.   Physician Treatment Plan for Primary Diagnosis: Bipolar disorder (HCC) Long Term Goal(s): Improvement in symptoms so as ready for discharge   Short Term Goals: Ability to identify changes in lifestyle to reduce recurrence of condition will improve Ability to verbalize feelings will improve Ability to disclose and discuss suicidal ideas Ability to demonstrate  self-control will improve Ability to identify and develop effective coping behaviors will improve Ability to maintain clinical measurements within normal limits will improve Compliance with prescribed medications will improve Ability to identify triggers associated with substance abuse/mental health issues will improve  Medication Management: Evaluate patient's response, side effects, and tolerance of medication regimen.  Therapeutic Interventions: 1 to 1 sessions, Unit Group sessions and Medication administration.  Evaluation of Outcomes: Not Progressing  Physician Treatment Plan for Secondary Diagnosis: Principal Problem:   Bipolar disorder (HCC)  Long Term Goal(s): Improvement in symptoms so as ready for discharge   Short Term Goals: Ability to identify changes in lifestyle to reduce recurrence of condition will improve Ability to verbalize feelings will improve Ability to disclose and discuss suicidal ideas Ability to demonstrate self-control will improve Ability to identify and develop effective coping behaviors will improve Ability to maintain clinical measurements within normal limits will improve Compliance with prescribed medications will improve Ability to identify triggers associated with substance abuse/mental health issues will improve     Medication Management: Evaluate patient's response, side effects, and tolerance of medication regimen.  Therapeutic Interventions: 1 to 1 sessions, Unit Group sessions and Medication administration.  Evaluation of Outcomes: Not Progressing   RN Treatment Plan for Primary Diagnosis: Bipolar disorder (HCC) Long Term Goal(s): Knowledge of disease and therapeutic regimen to maintain health will improve  Short Term Goals: Ability to remain free from injury will improve, Ability to verbalize frustration and anger appropriately will improve, Ability to demonstrate self-control, Ability to participate in decision making will improve,  Ability to verbalize feelings will improve, Ability to disclose and discuss suicidal ideas, Ability to identify and develop effective coping behaviors will improve, and Compliance with prescribed medications will improve  Medication Management: RN will administer medications as ordered by provider, will assess and evaluate patient's response and provide education to patient for prescribed medication. RN will report any adverse and/or side effects to prescribing provider.  Therapeutic Interventions: 1 on 1 counseling sessions, Psychoeducation, Medication administration, Evaluate responses to treatment, Monitor vital signs and CBGs as ordered, Perform/monitor CIWA, COWS, AIMS and Fall Risk screenings as ordered, Perform wound care treatments as ordered.  Evaluation of Outcomes: Not Progressing   LCSW Treatment Plan for Primary Diagnosis: Bipolar disorder [facility]) Long Term Goal(s): Safe transition to appropriate next level of care at discharge, Engage patient in therapeutic group addressing interpersonal concerns.  Short Term Goals: Engage patient in aftercare  planning with referrals and resources, Increase social support, Increase ability to appropriately verbalize feelings, Increase emotional regulation, Facilitate acceptance of mental health diagnosis and concerns, Facilitate patient progression through stages of change regarding substance use diagnoses and concerns, Identify triggers associated with mental health/substance abuse issues, and Increase skills for wellness and recovery  Therapeutic Interventions: Assess for all discharge needs, 1 to 1 time with Social worker, Explore available resources and support systems, Assess for adequacy in community support network, Educate family and significant other(s) on suicide prevention, Complete Psychosocial Assessment, Interpersonal group therapy.  Evaluation of Outcomes: Not Progressing   Progress in Treatment: Attending groups: No. Participating in  groups: No. Taking medication as prescribed: Yes. Toleration medication: Yes. Family/Significant other contact made: No, consents are pending Patient understands diagnosis: Yes. Discussing patient identified problems/goals with staff: Yes. Medical problems stabilized or resolved: Yes. Denies suicidal/homicidal ideation: Yes. Issues/concerns per patient self-inventory: No.  New problem(s) identified:  No  New Short Term/Long Term Goal(s):    medication stabilization, elimination of SI thoughts, development of comprehensive mental wellness plan.    Patient Goals:  "I'm dealing with grief because my wife and I are getting separated."  Discharge Plan or Barriers:   Reason for Continuation of Hospitalization: Anxiety Medication stabilization Suicidal ideation  Estimated Length of Stay:  5 - 7 days  Last 3 [place] Suicide Severity Risk Score: Flowsheet Row [facility] from 07/24/2023 in [facility] at [facility] Admission (Discharged) from 01/13/2023 in [place]  C-SSRS RISK CATEGORY High Risk High Risk No Risk       Last PHQ 2/9 Scores:    07/05/2023    3:09 PM 05/23/2023    8:42 AM 05/11/2023    1:40 PM  Depression screen PHQ 2/9  Decreased Interest 0 0 0  Down, Depressed, Hopeless 0 0 0  PHQ - 2 Score 0 0 0    Scribe for Treatment Team: Lauralye Kinn O Elvira Langston, LCSWA 07/25/2023 5:38 PM

## 2023-07-25 NOTE — Group Note (Signed)
 Recreation Therapy Group Note   Group Topic:Stress Management  Group Date: 07/25/2023 Start Time: 0930 End Time: 5409 Facilitators: Aleyza Salmi-McCall, LRT,CTRS Location: 300 Hall Dayroom   Group Topic: Stress Management  Goal Area(s) Addresses:  Patient will identify positive stress management techniques. Patient will identify benefits of using stress management post d/c.  Intervention: Insight Timer App  Activity: Morning Meditation. LRT played a meditation that focused on preparing for the day ahead. The meditation guided patients to envision being on the beach and breathing with the flow of the water washing onto the sand. The meditation also encouraged patients to picture the positives they should expect during the day.    Education:  Stress Management, Discharge Planning.   Education Outcome: Acknowledges Education   Affect/Mood: N/A   Participation Level: Did not attend    Clinical Observations/Individualized Feedback:     Plan: Continue to engage patient in RT group sessions 2-3x/week.   Allean Montfort-McCall, LRT,CTRS 07/25/2023 12:38 PM

## 2023-07-25 NOTE — Group Note (Signed)
 Occupational Therapy Group Note  Group Topic: Sleep Hygiene  Group Date: 07/25/2023 Start Time: 1430 End Time: 1504 Facilitators: Lynnda Sas, OT   Group Description: Group encouraged increased participation and engagement through topic focused on sleep hygiene. Patients reflected on the quality of sleep they typically receive and identified areas that need improvement. Group was given background information on sleep and sleep hygiene, including common sleep disorders. Group members also received information on how to improve one's sleep and introduced a sleep diary as a tool that can be utilized to track sleep quality over a length of time. Group session ended with patients identifying one or more strategies they could utilize or implement into their sleep routine in order to improve overall sleep quality.        Therapeutic Goal(s):  Identify one or more strategies to improve overall sleep hygiene  Identify one or more areas of sleep that are negatively impacted (sleep too much, too little, etc)     Participation Level: Engaged   Participation Quality: Independent   Behavior: Appropriate   Speech/Thought Process: Relevant   Affect/Mood: Appropriate   Insight: Fair   Judgement: Fair      Modes of Intervention: Education  Patient Response to Interventions:  Attentive   Plan: Continue to engage patient in OT groups 2 - 3x/week.  07/25/2023  Lynnda Sas, OT   Joel Carr, OT

## 2023-07-25 NOTE — Group Note (Signed)
 Date:  07/25/2023 Time:  8:15 PM  Group Topic/Focus:    Pt did attend AA group  Briana Farner A Elizeth Weinrich 07/25/2023, 8:15 PM

## 2023-07-25 NOTE — Progress Notes (Signed)
 Patient is a 38 year old male admitted voluntarily . He reports that his wife of 17 years told him that she wants a divorce and he has had difficulty sleeping, eating and concentrating. Medical hx include Bipolar, anxiety, chronic pain syndrome , hyperlipidemia and restless leg syndrome; He was pleasant and cooperative, skin assessment completed and he was oriented to unit.

## 2023-07-25 NOTE — Plan of Care (Signed)
   Problem: Education: Goal: Emotional status will improve Outcome: Not Progressing Goal: Mental status will improve Outcome: Not Progressing

## 2023-07-25 NOTE — ED Provider Notes (Signed)
-----------------------------------------   1:50 AM on 07/25/2023 -----------------------------------------   Patient transferring to [facility]   Norlene Beavers, MD 07/25/23 [phone]

## 2023-07-25 NOTE — ED Notes (Signed)
Report given to Kennyth Lose, RN at [facility].

## 2023-07-26 DIAGNOSIS — F3132 Bipolar disorder, current episode depressed, moderate: Secondary | ICD-10-CM | POA: Diagnosis not present

## 2023-07-26 NOTE — Progress Notes (Signed)
   07/25/23 2100  Psych Admission Type (Psych Patients Only)  Admission Status Voluntary  Psychosocial Assessment  Patient Complaints Anxiety;Hopelessness;Depression  Eye Contact Fair  Facial Expression Flat  Affect Anxious;Depressed  Speech Logical/coherent  Interaction Assertive  Motor Activity Slow  Appearance/Hygiene Unremarkable  Behavior Characteristics Cooperative  Mood Anxious;Depressed  Thought Process  Coherency WDL  Content WDL  Delusions None reported or observed  Perception WDL  Hallucination None reported or observed  Judgment WDL  Confusion None  Danger to Self  Current suicidal ideation? Denies  Agreement Not to Harm Self Yes  Description of Agreement Contract for safety  Danger to Others  Danger to Others None reported or observed

## 2023-07-26 NOTE — Progress Notes (Signed)
 [facility] MD Progress Note  07/26/2023 5:43 PM Joel Carr  MRN:  [id number]  Principal Problem: Bipolar disorder [facility]) Diagnosis: Principal Problem:   Bipolar disorder [facility])  Reason for admission: Joel Carr is a 38 y.o. Caucasian male with prior psychiatric history significant for bipolar 1 disorder depressed and anxiety who presents [facility] to [facility] from [facility] ED for worsening depression resulting in suicidal ideation in the context of being in the process of divorce by the wife of 17 years   Chart Review from last 24 hours:  The patient's chart was reviewed and nursing notes were reviewed. The patient's case was discussed in multidisciplinary team meeting. Per [facility]   Today's assessment notes: The patient was seen and evaluated on the unit. On assessment today the patient reports his mood is less depressed, and rated depression as #2/10 with 10 being high severity.  He is alert, cooperative, and oriented to person, place, time, and situation.  Chart reviewed and findings shared with the treatment team and consult with attending psychiatrist Dr. Linnie Riches.  He is compliant with his psychotropic medications.  Reports that his wife is still supportive, however, still moving forward to with the plan of divorce.  Denies delusional thinking or paranoia.  Reports he needs resources for substance abuse treatment and housing.  The patient requests to be made known to the social worker for assistance.  He denies SI, HI, or AVH.  Reports that anxiety is at manageable level Sleep is improved Appetite is better Concentration is good Energy level is adequate Denies suicidal thoughts.  Further denies suicidal intent and plan.  Denies having any HI.  Denies having psychotic symptoms.   Denies having side effects to current psychiatric medications.   We discussed compliance to current medication regimen. Discussed the following psychosocial stressors: Of going through  the process of divorce with the wife of 17 years.  Emotional support and active listening provided.  Encouraged to attend therapeutic milieu and unit group activities at this has proven to improve patient's mood.  Total Time spent with patient: 45 minutes  Past Psychiatric History: Information collected from patient, ED treatment Prev Dx/Sx: bipolar disorder Current Psych Provider: Yes, see above Home Meds (current): Lamictal .  See above Previous Med Trials: denies Therapy: yes see above Prior Psych Hospitalization: yes. One prior psych hospitalization due to SI Prior Self Harm: denies Prior Violence: denies  Past Medical History:  Past Medical History:  Diagnosis Date   Anxiety    Bipolar disorder (HCC)    Chronic pain syndrome    COVID-19 virus infection 12/23/2019   mild congestion and body aches   Depression    History of kidney stones    History of migraine    Hyperlipidemia    Pneumonia    RLS (restless legs syndrome)     Past Surgical History:  Procedure Laterality Date   ARTHRODESIS METATARSAL Left 09/19/2020   Procedure: ARTHRODESIS METATARSAL; LISFRANC MULTIPLE-1/2/3;  Surgeon: Pink Bridges, DPM;  Location: [facility];  Service: Podiatry;  Laterality: Left;   ARTHRODESIS METATARSAL Left 01/13/2023   Procedure: ARTHRODESIS; LISFRANC; MULTIPLE FOURTH AND FIFTH;  Surgeon: Pink Bridges, DPM;  Location: [facility];  Service: Orthopedics/Podiatry;  Laterality: Left;   FOOT ARTHRODESIS Left 09/19/2020   Procedure: ARTHRODESIS FOOT- *POSSIBLE BONE GRAFT;  Surgeon: Pink Bridges, DPM;  Location: [facility];  Service: Podiatry;  Laterality: Left;   FOOT ARTHRODESIS Left 01/13/2023   Procedure: AUTOGRAFT HARVEST;  Surgeon: Pink Bridges, DPM;  Location: [facility];  Service: Orthopedics/Podiatry;  Laterality: Left;   KNEE ARTHROSCOPY WITH MENISCAL REPAIR Right 10/29/2014   Procedure: KNEE ARTHROSCOPY WITH MENISCAL REPAIR;  Surgeon: Molli Angelucci, MD;  Location:  [facility];  Service: Orthopedics;  Laterality: Right;   OPEN REDUCTION INTERNAL FIXATION (ORIF) FOOT LISFRANC FRACTURE Left 09/19/2020   Procedure: OPEN REDUCTION INTERNAL FIXATION (ORIF) FOOT LISFRANC FRACTURE x2 4/5  PERCUTANEOUS SKELETAL FIXATION OF TARSOMETATARSAL JOINT DISLOCATION WITH MANIPULATION, X2 4/5;  Surgeon: Pink Bridges, DPM;  Location: [facility];  Service: Podiatry;  Laterality: Left;   TESTICLE SURGERY N/A    as a child   TYMPANOPLASTY Bilateral 1990   Family History:  Family History  Problem Relation Age of Onset   Depression Father    Alcohol abuse Father    Diabetes Paternal Grandfather    Heart attack Paternal Grandfather    Colon cancer Neg Hx    Family Psychiatric  History: See H&P Social History:  Social History   Substance and Sexual Activity  Alcohol Use Not Currently   Comment: rarely     Social History   Substance and Sexual Activity  Drug Use No    Social History   Socioeconomic History   Marital status: Married    Spouse name: True Fuss   Number of children: 2   Years of education: 10-   Highest education level: GED or equivalent  Occupational History   Not on file  Tobacco Use   Smoking status: Every Day    Types: Cigars, E-cigarettes   Smokeless tobacco: Never   Tobacco comments:    3 cig daily-04/29/2020  Vaping Use   Vaping status: Every Day   Substances: Nicotine , Flavoring  Substance and Sexual Activity   Alcohol use: Not Currently    Comment: rarely   Drug use: No   Sexual activity: Yes  Other Topics Concern   Not on file  Social History Narrative   Married with 2 kids. 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, prison and probation services   Social Drivers of Health   Financial Resource Strain: Medium Risk (02/14/2023)   Overall Financial Resource Strain (CARDIA)    Difficulty of Paying Living Expenses: Somewhat hard  Food Insecurity: Food Insecurity Present (07/25/2023)   Hunger Vital Sign    Worried About Running Out of  Food in the Last Year: Sometimes true    Ran Out of Food in the Last Year: Sometimes true  Transportation Needs: Patient Declined (07/25/2023)   PRAPARE - Transportation    Lack of Transportation (Medical): Patient declined    Lack of Transportation (Non-Medical): Patient declined  Physical Activity: Sufficiently Active (02/14/2023)   Exercise Vital Sign    Days of Exercise per Week: 7 days    Minutes of Exercise per Session: 30 min  Stress: No Stress Concern Present (02/14/2023)   [employer] of Occupational Health - Occupational Stress Questionnaire    Feeling of Stress : Only a little  Social Connections: Moderately Isolated (02/14/2023)   Social Connection and Isolation Panel [NHANES]    Frequency of Communication with Friends and Family: More than three times a week    Frequency of Social Gatherings with Friends and Family: Three times a week    Attends Religious Services: Never    Active Member of Clubs or Organizations: No    Attends Engineer, structural: Not on file    Marital Status: Married   Additional Social History:   Sleep: Good  Appetite:  Good  Current Medications: Current Facility-Administered Medications  Medication Dose Route Frequency Provider Last Rate Last Admin   acetaminophen  (TYLENOL ) tablet 650 mg  650 mg Oral Q6H PRN Ajibola, Ene A, NP   650 mg at 07/25/23 1623   alum & mag hydroxide-simeth (MAALOX/MYLANTA) 200-200-20 MG/5ML suspension 30 mL  30 mL Oral Q4H PRN Ajibola, Ene A, NP       amitriptyline (ELAVIL) tablet 25 mg  25 mg Oral QHS Heaton Sarin C, FNP       haloperidol (HALDOL) tablet 5 mg  5 mg Oral TID PRN Ajibola, Ene A, NP       And   diphenhydrAMINE (BENADRYL) capsule 50 mg  50 mg Oral TID PRN Ajibola, Ene A, NP       haloperidol lactate (HALDOL) injection 5 mg  5 mg Intramuscular TID PRN Ajibola, Ene A, NP       And   diphenhydrAMINE (BENADRYL) injection 50 mg  50 mg Intramuscular TID PRN Ajibola, Ene A, NP       And    LORazepam (ATIVAN) injection 2 mg  2 mg Intramuscular TID PRN Ajibola, Ene A, NP       haloperidol lactate (HALDOL) injection 10 mg  10 mg Intramuscular TID PRN Ajibola, Ene A, NP       And   diphenhydrAMINE (BENADRYL) injection 50 mg  50 mg Intramuscular TID PRN Ajibola, Ene A, NP       And   LORazepam (ATIVAN) injection 2 mg  2 mg Intramuscular TID PRN Ajibola, Ene A, NP       feeding supplement (ENSURE ENLIVE / ENSURE PLUS) liquid 237 mL  237 mL Oral BID BM Zouev, Dmitri, MD   237 mL at 07/26/23 1608   gabapentin  (NEURONTIN ) capsule 300 mg  300 mg Oral QHS Kristilyn Coltrane C, FNP       hydrOXYzine (ATARAX) tablet 25 mg  25 mg Oral TID PRN Ajibola, Ene A, NP   25 mg at 07/26/23 1342   lamoTRIgine  (LAMICTAL ) tablet 100 mg  100 mg Oral BID Finleigh Cheong C, FNP   100 mg at 07/26/23 1608   magnesium  hydroxide (MILK OF MAGNESIA) suspension 30 mL  30 mL Oral Daily PRN Ajibola, Ene A, NP       propranolol  ER (INDERAL  LA) 24 hr capsule 80 mg  80 mg Oral Daily Anjolie Majer C, FNP   80 mg at 07/26/23 4098   SUMAtriptan  (IMITREX ) tablet 25 mg  25 mg Oral Q2H PRN Cherril Hett C, FNP       traZODone (DESYREL) tablet 200 mg  200 mg Oral QHS PRN Matti Minney C, FNP   200 mg at 07/25/23 2134    Lab Results:  Results for orders placed or performed during the hospital encounter of 07/25/23 (from the past 48 hours)  TSH     Status: None   Collection Time: 07/25/23  6:40 PM  Result Value Ref Range   TSH 0.722 0.350 - 4.500 uIU/mL    Comment: Performed by a 3rd Generation assay with a functional sensitivity of <=0.01 uIU/mL. Performed at [facility], [address], [place], [place] [address]   Vitamin B12     Status: None   Collection Time: 07/25/23  6:40 PM  Result Value Ref Range   Vitamin B-12 780 180 - 914 pg/mL    Comment: (NOTE) This assay is not validated for testing neonatal or myeloproliferative syndrome specimens for Vitamin B12 levels. Performed at [facility]  [facility],  [address] [place], [place] [address]   VITAMIN D 25 Hydroxy (Vit-D Deficiency, Fractures)     Status: None   Collection Time: 07/25/23  6:40 PM  Result Value Ref Range   Vit D, 25-Hydroxy 37.70 30 - 100 ng/mL    Comment: (NOTE) Vitamin D deficiency has been defined by the Institute of Medicine  and an Endocrine Society practice guideline as a level of serum 25-OH  vitamin D less than 20 ng/mL (1,2). The Endocrine Society went on to  further define vitamin D insufficiency as a level between 21 and 29  ng/mL (2).  1. IOM (Institute of Medicine). 2010. Dietary reference intakes for  calcium  and D. [place]: [employer]. 2. Holick MF, Binkley Holt, Bischoff-Ferrari HA, et al. Evaluation,  treatment, and prevention of vitamin D deficiency: an Endocrine  Society clinical practice guideline, JCEM. 2011 Jul; 96(7): 1911-30.  Performed at [facility], [address], [place], [place] [address]   Hemoglobin A1c     Status: Abnormal   Collection Time: 07/25/23  6:40 PM  Result Value Ref Range   Hgb A1c MFr Bld 4.7 (L) 4.8 - 5.6 %    Comment: (NOTE) Pre diabetes:          5.7%-6.4%  Diabetes:              >6.4%  Glycemic control for   <7.0% adults with diabetes    Mean Plasma Glucose 88.19 mg/dL    Comment: Performed at [facility], [address] [place], [place] [address]   Blood Alcohol level:  Lab Results  Component Value Date   [facility] <15 07/24/2023   ETH <10 08/20/2020   Metabolic Disorder Labs: Lab Results  Component Value Date   HGBA1C 4.7 (L) 07/25/2023   MPG 88.19 07/25/2023   No results found for: "PROLACTIN" Lab Results  Component Value Date   CHOL 230 (H) 02/17/2023   TRIG 250.0 (H) 02/17/2023   HDL 42.30 02/17/2023   CHOLHDL 5 02/17/2023   VLDL 50.0 (H) 02/17/2023   LDLCALC 138 (H) 02/17/2023   LDLCALC 130 (H) 06/12/2021   Physical Findings: AIMS:  , ,  ,  ,    CIWA:    COWS:     Musculoskeletal: Strength &  Muscle Tone: within normal limits Gait & Station: normal Patient leans: N/A  Psychiatric Specialty Exam:  Presentation  General Appearance:  Appropriate for Environment; Casual  Eye Contact: Good  Speech: Clear and Coherent  Speech Volume: Normal  Handedness: Right  Mood and Affect  Mood: Anxious; Depressed  Affect: Appropriate  Thought Process  Thought Processes: Coherent  Descriptions of Associations:Intact  Orientation:Full (Time, Place and Person)  Thought Content:Logical  History of Schizophrenia/Schizoaffective disorder:No  Duration of Psychotic Symptoms:No data recorded Hallucinations:Hallucinations: None  Ideas of Reference:None  Suicidal Thoughts:Suicidal Thoughts: No  Homicidal Thoughts:Homicidal Thoughts: No  Sensorium  Memory: Immediate Good; Recent Good  Judgment: Fair  Insight: Fair  Art therapist  Concentration: Good  Attention Span: Good  Recall: Fair  Fund of Knowledge: Good  Language: Good  Psychomotor Activity  Psychomotor Activity: Psychomotor Activity: Normal  Assets  Assets: Communication Skills; Physical Health; Resilience; Desire for Improvement  Sleep  Sleep: Sleep: Good Number of Hours of Sleep: 9  Physical Exam: Physical Exam Vitals and nursing note reviewed.  Constitutional:      Appearance: He is normal weight.  HENT:     Head: Normocephalic.  Right Ear: External ear normal.     Left Ear: External ear normal.     Nose: Nose normal.     Mouth/Throat:     Mouth: Mucous membranes are moist.     Pharynx: Oropharynx is clear.  Eyes:     Extraocular Movements: Extraocular movements intact.  Cardiovascular:     Rate and Rhythm: Normal rate.     Pulses: Normal pulses.  Pulmonary:     Effort: Pulmonary effort is normal.  Abdominal:     Comments: Deferred  Genitourinary:    Comments: Deferred Musculoskeletal:        General: Normal range of motion.     Cervical back: Normal  range of motion.  Skin:    General: Skin is warm.  Neurological:     General: No focal deficit present.     Mental Status: He is alert and oriented to person, place, and time.  Psychiatric:        Mood and Affect: Mood normal.        Behavior: Behavior normal.    Review of Systems  Constitutional:  Negative for chills and fever.  HENT:  Negative for sore throat.   Eyes:  Negative for blurred vision.  Respiratory:  Negative for cough, sputum production, shortness of breath and wheezing.   Cardiovascular:  Negative for chest pain and palpitations.  Gastrointestinal:  Negative for heartburn and nausea.  Genitourinary:  Negative for dysuria, frequency and urgency.  Musculoskeletal:  Negative for falls.  Skin:  Negative for itching and rash.  Neurological:  Negative for dizziness, tingling and headaches.  Endo/Heme/Allergies:        See allergy listing  Psychiatric/Behavioral:  Positive for depression. Negative for hallucinations, substance abuse and suicidal ideas. The patient is nervous/anxious. The patient does not have insomnia.    Blood pressure 101/70, pulse 67, temperature 98 F (36.7 C), resp. rate 20, height 5\' 8"  (1.727 m), weight 67.1 kg, SpO2 99%. Body mass index is 22.5 kg/m.   Treatment Plan Summary: Daily contact with patient to assess and evaluate symptoms and progress in treatment and Medication management Treatment Plan Summary: Daily contact with patient to assess and evaluate symptoms and progress in treatment and Medication management   Physician Treatment Plan for Primary Diagnosis: Bipolar disorder (HCC) Long Term Goal(s): Improvement in symptoms so as ready for discharge   Short Term Goals: Ability to identify changes in lifestyle to reduce recurrence of condition will improve, Ability to verbalize feelings will improve, Ability to disclose and discuss suicidal ideas, Ability to demonstrate self-control will improve, Ability to identify and develop effective  coping behaviors will improve, Ability to maintain clinical measurements within normal limits will improve, Compliance with prescribed medications will improve, and Ability to identify triggers associated with substance abuse/mental health issues will improve   Physician Treatment Plan for Secondary Diagnosis: Assessment: Joel Carr is a 38 y.o. Caucasian male with prior psychiatric history significant for bipolar 1 disorder depressed and anxiety who [facility] ED for worsening depression resulting in suicidal ideation in the context of being in the process of divorce by the wife of 17 years.    Principal Problem:   Bipolar disorder (HCC)   Plans: Medications: Continue home medications -- Amitriptyline 25 mg tablets p.o. at bedtime for depression -- Gabapentin  300 mg capsule p.o. at bedtime for neuropathy -- Lamictal  100 mg tablets 1 p.o. 2 times daily for bipolar -- Trazodone 100 mg  tablets at bedtime for sleep as needed   Medications for other medical problems: -- Propranolol  ER 80 mg 24-hour capsule p.o. daily for blood pressure -- Imitrex  25 mg tablets may repeat in 2 hours for a maximum of 100 mg every 24 hours for Tension headache   Other PRN Medications  -Acetaminophen  650 mg every 6 as needed/mild pain  -Maalox 30 mL oral every 4 as needed/digestion  -Magnesium  hydroxide 30 mL daily as needed/mild constipation    Admission lab reviewed: CMP: Potassium 3.2 low, glucose 132 high, BUN 5 low, alkaline phosphatase 37 low, total protein 6.1 low, otherwise normal.  Lipid profile: Cholesterol 230 high, LDL 138 high, triglyceride 250 high, VLDL 50.  Otherwise normal.  CBC with differential: Within normal limits.  UDS: Positive for cocaine metabolites, positive for benzodiazepines, positive for tricyclic urine screen.  TSH: 1.29, within normal limits   New labs ordered: Hemoglobin A1c, vitamin B12, 25 hydroxyvitamin D,    EKG reviewed: Normal sinus rhythm, ventricular rate 63, QT/QTc 394/403   Continue BH Agitation Protocol  --Haldol 5 mg, oral, 3 times daily as needed, mild agitation  --Benadryl 50 mg, oral, 3 times daily as needed, mild agitation                                    OR   --Haldol injection 5 mg, IM, 3 times daily as needed, moderate agitation  --Benadryl injection 50 mg, IM, 3 times daily as needed, moderate agitation  --Ativan injection 2 mg, IM, 3 times daily as needed, moderate agitation                                      OR  --Haldol injection 10 mg, IM, 3 times daily as needed, severe agitation  --Benadryl injection 50 mg, IM, 3 times daily as needed, severe agitation  --Ativan injection 2 mg, IM, 3 times daily as needed, severe agitation    --  The risks/benefits/side-effects/alternatives to this medication were discussed in detail with the patient and time was given for questions. The patient consents to medication trial.   -- Metabolic profile and EKG monitoring obtained while on an atypical antipsychotic (BMI: Lipid Panel: HbgA1c: QTc:)   -- Encouraged patient to participate in unit milieu and in scheduled group therapies     Safety and Monitoring:  Voluntary admission to inpatient psychiatric unit for safety, stabilization and treatment  Daily contact with patient to assess and evaluate symptoms and progress in treatment  Patient's case to be discussed in multi-disciplinary team meeting  Observation Level : q15 minute checks  Vital signs: q12 hours  Precautions: suicide, but pt currently verbally contracts for safety on unit?    Laurence Pons, FNP 07/26/2023, 5:43 PM

## 2023-07-26 NOTE — Progress Notes (Signed)
 Social worker was given patient's therapist phone number for scheduling follow up outpatient appt: Simon Dubin, [phone].

## 2023-07-26 NOTE — Group Note (Signed)
 Date:  07/26/2023 Time:  9:02 AM  Group Topic/Focus:  Goals Group:   The focus of this group is to help patients establish daily goals to achieve during treatment and discuss how the patient can incorporate goal setting into their daily lives to aide in recovery.    Participation Level:  Active  Participation Quality:  Appropriate  Affect:  Appropriate   Joel Carr 07/26/2023, 9:02 AM

## 2023-07-26 NOTE — Progress Notes (Signed)
 Social worker gave patient a list of housing resources including low income housing, shelters, and halfway houses. Patient also received resources related to substance abuse treatment programs, as he's interested in treatment upon discharge. Patient stated he would call [facility] to complete phone screening.  Sachit Gilman, LCSWA 1:44PM

## 2023-07-26 NOTE — Group Note (Signed)
 LCSW Group Therapy Note   Group Date: 07/26/2023 Start Time: 1100 End Time: 1200  Participation:  patient was present.  He listened and was very respectful, and somewhat participated in the conversation.  Type of Therapy:  Group Therapy  Topic:  "Finding Balance: Using Wise Carr for Thoughtful Decisions"  Objective:  the objective of this class is to help participants understand the concept of Wise Carr and learn how to apply it to real-life situations to make balanced, thoughtful decisions. Participants will gain tools to manage emotions, consider logic, and find a middle ground that leads to healthier responses and outcomes.  Goals: Understand the concept of Wise Carr.  Participants will learn the difference between Emotional Carr, Reasonable Carr, and Joel Carr, and how Joel Carr helps in balancing emotions and logic to make thoughtful decisions. Recognize when you're in Emotional Carr or Reasonable Carr.  Participants will identify the signs of Emotional Carr and Reasonable Carr in their own reactions to situations and understand how to move into Wise Carr for more balanced responses. Practice applying Joel Carr to real-life situations.  Through scenarios and group activities, participants will practice using Wise Carr in everyday situations, learning how to acknowledge their emotions, think logically, and create solutions that are thoughtful and balanced.  Summary: In this class, we explored the concept of Wise Carr--the balance between Emotional Carr and Reasonable Carr. We discussed how Emotional Carr can sometimes lead to impulsive, reactive decisions driven by intense feelings, and how Reasonable Carr might ignore feelings altogether, focusing only on facts and logic. Joel Carr is the middle ground that combines both, allowing you to consider your emotions and use logic to make balanced, thoughtful decisions.  We learned how to recognize when we're in Emotional Carr or Reasonable Carr,  and practiced using Wise Carr in real-life situations. By using Joel Carr, we can improve how we handle challenging situations, make better decisions, and strengthen our relationships with others.  Therapeutic Modalities: Elements of DBT - emotional regulation   Brihany Butch O Risha Barretta, LCSWA 07/26/2023  1:10 PM

## 2023-07-26 NOTE — [facility] Counselor (Signed)
 Adult Comprehensive Assessment  Patient ID: Joel Carr, male   DOB: 1985-06-07, 38 y.o.   MRN: [id number]  Information Source: Information source: Patient  Current Stressors:  Patient states their primary concerns and needs for treatment are:: "My wife told me she wanted a divorced and with the bipolar just kept getting worse and worse. I didn't want to go on without her and didn't realized at the time that she would still be in my life. My friend and ex-wife talked me into going to [facility] for help and I'm glad they did" Patient states their goals for this hospitilization and ongoing recovery are:: "To come up with a plan for when I get out so I have something to look forward to" Educational / Learning stressors: None reported Employment / Job issues: None reported Family Relationships: None reported Surveyor, quantity / Lack of resources (include bankruptcy): "I'm not working at the moment but since I was living with my wife, that wasn't an issue". Housing / Lack of housing: None reported Physical health (include injuries & life threatening diseases): None reported Social relationships: "I know I need to change the people I hang around who probably aren't the best for me right now". Substance abuse: "Yeah I want to stop cocaine use. I know I need help. I can do it all week long and still function" Bereavement / Loss: "Her dad came to live with us  in early June 02, 2023 then died at the end of Jun 02, 2023 so that was a nother stressor"  Living/Environment/Situation:  Living Arrangements: Other relatives, Spouse/significant other Living conditions (as described by patient or guardian): "It was with my wife but I know my wife wants to make sure I'm in a better place and clean." Who else lives in the home?: "Me, my wife, and two teenagers." How long has patient lived in current situation?: "for 17 years but we've been together since the age of 29" What is atmosphere in current home: Supportive,  Loving, Comfortable  Family History:  Marital status: Married Number of Years Married: 17 What types of issues is patient dealing with in the relationship?: "My wife is supportive and wants the best for me." Additional relationship information: None reported Are you sexually active?: Yes What is your sexual orientation?: "heterosexual" Has your sexual activity been affected by drugs, alcohol, medication, or emotional stress?: "Yeah, especially with the lack of wanting to do it" Does patient have children?: Yes How many children?: 2 How is patient's relationship with their children?: "47 year old son, 93 year old daughter"  Childhood History:  By whom was/is the patient raised?: Grandparents Additional childhood history information: "I lived with my dad and grandparents growing up". Description of patient's relationship with caregiver when they were a child: "With my dad, it was good. With my grandparents things were great, they were great to me and my brother. I didn't see my dad a lot because he'd work a lot" Patient's description of current relationship with people who raised him/her: "My grandfather passed away in 05/02/2019. My grandmother lives in assisted living and I get to see her everyday or every other day. We have  great relationship" How were you disciplined when you got in trouble as a child/adolescent?: "There really wasn't any discipline" Does patient have siblings?: Yes Number of Siblings: 2 (1 "blood brother" and step sister) Description of patient's current relationship with siblings: "Me and my brother don't talk alot but if I needed anyting I can call him. Me and my sister are  close". Did patient suffer any verbal/emotional/physical/sexual abuse as a child?: No Has patient ever been sexually abused/assaulted/raped as an adolescent or adult?: No Was the patient ever a victim of a crime or a disaster?: No Witnessed domestic violence?: No Has patient been affected by  domestic violence as an adult?: No  Education:  Highest grade of school patient has completed: "GED" Currently a student?: No Learning disability?: No  Employment/Work Situation:   Employment Situation: Unemployed Patient's Job has Been Impacted by Current Illness: No Describe how Patient's Job has Been Impacted: None reported What is the Longest Time Patient has Held a Job?: "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?: No  Financial Resources:   Surveyor, quantity resources: Media planner, Food stamps Does patient have a Lawyer or guardian?: No  Alcohol/Substance Abuse:   What has been your use of drugs/alcohol within the last 12 months?: "Cocaine use, ETOH consumption I don't drink more than 1 beer at a setting and I don't drink everyday just here and there" If attempted suicide, did drugs/alcohol play a role in this?: No Alcohol/Substance Abuse Treatment Hx: Denies past history If yes, describe treatment: None reported Has alcohol/substance abuse ever caused legal problems?: No  Social Support System:   Patient's Community Support System: Good Describe Community Support System: "Family and friends, wife especially. 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 and stay overnight and listen to podcasts and music"  Strengths/Needs:   What is the patient's perception of their strengths?: "I learn quick, I don't give up very easily once I start something, I'm friendly and get along with everybody, I'll help anyone out" Patient states they can  use these personal strengths during their treatment to contribute to their recovery: "Listening and taking notes and trying to implement them everyday" Patient states these barriers may affect/interfere with their treatment: "Going back to the same friends I was hanging out with" Patient states these barriers may affect their return to the community: "Having no where to go" Other important information patient would like considered in planning for their treatment: None reported  Discharge Plan:   Currently receiving community mental health services: Yes (From Whom) Patient states concerns and preferences for aftercare planning are: "I do have a psychiatrist I see at [facility] - Marley Simmers; therapist (virtual) Patient states they will know when they are safe and ready for discharge when: "I do have a psychiatrist I see at [facility] - Marley Simmers; Donnie Galea - therapist (virtual)". Does patient have access to transportation?: Yes Does patient have financial barriers related to discharge medications?: Yes Patient description of barriers related to discharge medications: None reported Plan for living situation after discharge: "Substance abuse treatment programs" Will patient be returning to same living situation after discharge?: No  Summary/Recommendations:   Summary and Recommendations (to be completed by the evaluator): Elodia Hailstone. Anthes is a 38 year old Caucasian male who was voluntarily admitted from [facility] due to suicidal thoughts with no stated plan, experiencing anxiety, and depressed mood. Stressors include substance abuse, recent break-up and talks  of divorce from his wife, finances, and the recent death of his father-in-law two months ago. Patient reported he had been struggling a lot lately due to his cocaine abuse and his wife requesting a divorce after being married for 17 years and sharing two children. Another stressor the patient mentioned  pertained to his friend group and hanging around those that aren't the best influence at this time. He reported the use of illicit, mood-altering substances including cocaine and the consumption of alcohol. Patient stated he only drinks alcohol occasionally, but that his primary concern is his cocaine use and mental well-being. Urinary drug screen was positive for cocaine, benzodiazepines, and tricyclics. He denies AH/VH, HI, and SI at the time of assessment. He reports having the support of his wife who wants the best for him despite their recent break-up. Patient reported being interested in substance abuse treatment that offers housing as he isn't sure that his wife would want him around right now and thinks it's best to get the help he needs prior to returning to her home. Patient reports currently engaging in outpatient mental health treatment including therapy and psychiatry. Patient reports going to [facility] for medication management where he see's Marley Simmers. Patient reports having therapy virtually and see's Simon Dubin.While here, Cecilia can benefit from crisis stabilization, medication management, therapeutic milieu, and referrals for services.   Zafiro Routson M Hilman Kissling, LCSWA. 07/26/2023

## 2023-07-26 NOTE — Progress Notes (Signed)
 Adult Psychoeducational Group Note  Date:  07/26/2023 Time:  9:33 PM  Group Topic/Focus:  Wrap-Up Group:   The focus of this group is to help patients review their daily goal of treatment and discuss progress on daily workbooks.  Participation Level:  Active  Participation Quality:  Appropriate  Affect:  Appropriate  Cognitive:  Appropriate  Insight: Appropriate  Engagement in Group:  Engaged  Modes of Intervention:  Discussion  Additional Comments:  Pt stated he had a good day.  Pt goal for the day was to talk to Child psychotherapist. Pt met goal.  Joel Carr 07/26/2023, 9:33 PM

## 2023-07-26 NOTE — Progress Notes (Signed)
 D: Patient is alert, oriented, pleasant, and cooperative. Denies SI, HI, AVH, and verbally contracts for safety. Patient reports he slept poor last night with sleeping medication. Patient reports his appetite as fair, energy level as low, and concentration as good. Patient rates his depression 5/10, hopelessness 3/10, and anxiety 3/10. Patient reports anxiety.    A: Scheduled medications administered per MD order. PRN hydroxyzine administered. Support provided. Patient educated on safety on the unit and medications. Routine safety checks every 15 minutes. Patient stated understanding to tell nurse about any new physical symptoms. Patient understands to tell staff of any needs.     R: No adverse drug reactions noted. Patient remains safe at this time and will continue to monitor.    07/26/23 1000  Psych Admission Type (Psych Patients Only)  Admission Status Voluntary  Psychosocial Assessment  Patient Complaints Anxiety;Depression;Sleep disturbance  Eye Contact Fair  Facial Expression Animated  Affect Appropriate to circumstance  Speech Logical/coherent  Interaction Assertive  Motor Activity Other (Comment) (WNL)  Appearance/Hygiene Unremarkable  Behavior Characteristics Cooperative  Mood Pleasant  Thought Process  Coherency WDL  Content WDL  Delusions None reported or observed  Perception WDL  Hallucination None reported or observed  Judgment WDL  Confusion None  Danger to Self  Current suicidal ideation? Denies  Self-Injurious Behavior No self-injurious ideation or behavior indicators observed or expressed   Agreement Not to Harm Self Yes  Description of Agreement verbal  Danger to Others  Danger to Others None reported or observed

## 2023-07-26 NOTE — Plan of Care (Signed)

## 2023-07-26 NOTE — Plan of Care (Signed)
  Problem: Education: Goal: Verbalization of understanding the information provided will improve Outcome: Progressing   Problem: Education: Goal: Emotional status will improve Outcome: Not Progressing

## 2023-07-27 DIAGNOSIS — F3132 Bipolar disorder, current episode depressed, moderate: Secondary | ICD-10-CM | POA: Diagnosis not present

## 2023-07-27 NOTE — Group Note (Signed)
 Date:  07/27/2023 Time:  3:58 PM  Group Topic/Focus:  Recovery Goals:   The focus of this group is to promote emotional wellness by identifying unhealthy thought patterns and how to challenge them.    Participation Level:  Minimal  Participation Quality:  Appropriate  Affect:  Appropriate  Cognitive:  Appropriate  Insight: Appropriate  Engagement in Group:  Developing/Improving  Modes of Intervention:  Discussion and Exploration  Additional Comments:    Sheryl Donna 07/27/2023, 3:58 PM

## 2023-07-27 NOTE — Group Note (Signed)
 Recreation Therapy Group Note   Group Topic:Team Building  Group Date: 07/27/2023 Start Time: 1610 End Time: 1000 Facilitators: Christna Kulick-McCall, LRT,CTRS Location: [address] Dayroom   Group Topic: Communication, Team Building, Problem Solving  Goal Area(s) Addresses:  Patient will effectively work with peer towards shared goal.  Patient will identify skills used to make activity successful.  Patient will identify how skills used during activity can be applied to reach post d/c goals.   Intervention: STEM Activity- Glass blower/designer  Activity: Tallest [employer]. In teams of 5-6, patients were given 11 craft pipe cleaners. Using the materials provided, patients were instructed to compete again the opposing team(s) to build the tallest free-standing structure from floor level. The activity was timed; difficulty increased by Clinical research associate as Production designer, theatre/television/film continued.  Systematically resources were removed with additional directions for example, placing one arm behind their back, working in silence, and shape stipulations. LRT facilitated post-activity discussion reviewing team processes and necessary communication skills involved in completion. Patients were encouraged to reflect how the skills utilized, or not utilized, in this activity can be incorporated to positively impact support systems post discharge.  Education: Pharmacist, community, Scientist, physiological, Discharge Planning   Education Outcome: Acknowledges education/In group clarification offered/Needs additional education.    Affect/Mood: Appropriate   Participation Level: Engaged   Participation Quality: Independent   Behavior: Appropriate   Speech/Thought Process: Focused   Insight: Good   Judgement: Good   Modes of Intervention: Team-building   Patient Response to Interventions:  Engaged   Education Outcome:  In group clarification offered    Clinical Observations/Individualized Feedback: Pt attended and  participated in group session.     Plan: Continue to engage patient in RT group sessions 2-3x/week.   Janaiya Beauchesne-McCall, LRT,CTRS 07/27/2023 11:53 AM

## 2023-07-27 NOTE — [facility] Group Notes (Signed)
 [facility] Group Notes:  (Nursing/MHT/Case Management/Adjunct)  Date:  07/27/2023  Time:  2000  Type of Therapy:   Narcotics Anonymous Meeting  Participation Level:  Active  Participation Quality:  Appropriate, Attentive, and Supportive  Affect:  Appropriate  Cognitive:  Alert  Insight:  Improving  Engagement in Group:  Supportive  Modes of Intervention:  Clarification, Education, and Support  Summary of Progress/Problems:  Catharine Clock 07/27/2023, 9:17 PM

## 2023-07-27 NOTE — Progress Notes (Signed)
 Patient informed social worker that he completed phone screening with [facility] on 4/29. Patient reported to social worker that [facility] requested Psych Eval to be sent on 4/30. Social worker sent Psych Eval to [facility] via scan to email on 4/30. Social worker informed patient that Psych Eval was sent. Patient confirmed understanding. Social worker to follow up with [facility] within the next 24 hours.

## 2023-07-27 NOTE — [facility] Suicide Risk Assessment (Signed)
 [facility] INPATIENT:  Family/Significant Other Suicide Prevention Education  Suicide Prevention Education:  Education Completed; Joel Carr (wife),  (name of family member/significant other) has been identified by the patient as the family member/significant other with whom the patient will be residing, and identified as the person(s) who will aid the patient in the event of a mental health crisis (suicidal ideations/suicide attempt).  With written consent from the patient, the family member/significant other has been provided the following suicide prevention education, prior to the and/or following the discharge of the patient.  Patient will not have access to firearms/guns/weapons upon discharge. Patient is able to properly take medications, requiring no assistance and medications are able to be safely secured. Joel Carr reports no safety concerns regarding patient's discharge. Patient likely to discharge to substance abuse treatment program once stable.   The suicide prevention education provided includes the following: Suicide risk factors Suicide prevention and interventions National Suicide Hotline telephone number [name] assessment telephone number [name] Emergency Assistance 911 [name] and/or Residential Mobile Crisis Unit telephone number  Request made of family/significant other to: Remove weapons (e.g., guns, rifles, knives), all items previously/currently identified as safety concern.   Remove drugs/medications (over-the-counter, prescriptions, illicit drugs), all items previously/currently identified as a safety concern.  The family member/significant other verbalizes understanding of the suicide prevention education information provided.  The family member/significant other agrees to remove the items of safety concern listed above.  Joel Carr Joel Carr, LCSWA 07/27/2023, 12:40 PM

## 2023-07-27 NOTE — Group Note (Signed)
 Date:  07/27/2023 Time:  4:43 PM  Group Topic/Focus:  Goals Group:   The focus of this group is to help patients establish daily goals to achieve during treatment and discuss how the patient can incorporate goal setting into their daily lives to aide in recovery. Orientation:   The focus of this group is to educate the patient on the purpose and policies of crisis stabilization and provide a format to answer questions about their admission.  The group details unit policies and expectations of patients while admitted.    Participation Level:  Did Not Attend   Sheryl Donna 07/27/2023, 4:43 PM

## 2023-07-27 NOTE — Progress Notes (Signed)
 [facility] MD Progress Note  07/27/2023 3:50 PM RC SCHIRALDI  MRN:  [id number]  Principal Problem: Bipolar disorder [facility]) Diagnosis: Principal Problem:   Bipolar disorder [facility] ([facility]))  Reason for admission: OSEAS ANSTEAD is a 38 y.o. Caucasian male with prior psychiatric history significant for bipolar 1 disorder depressed and anxiety who presents voluntarily to [facility] from [facility] ED for worsening depression resulting in suicidal ideation in the context of being in the process of divorce by the wife of 17 years.  Chart Review from last 24 hours:  The patient's chart was reviewed and nursing notes were reviewed. The patient's case was discussed in multidisciplinary team meeting. Per [facility] patient is compliant with His psychotropic medications.  No signs reviewed without critical values.  Required trazodone x 1 for insomnia, hydroxyzine x 1 for anxiety.  Today's assessment notes: On assessment today, the patient reports his mood is less depressed, and rated depression as #5/10 with 10 being high severity.  He is alert, cooperative, and oriented to person, place, time, and situation.  Chart reviewed and findings shared with the treatment team and consult with attending psychiatrist Dr. Linnie Riches.  Patient reports that his birth day is coming up on Tuesday next week, and he does not want to be discharged prior to Tuesday because he does not trust his safety and impulsive behavior due to his stressors.  He is compliant with his psychotropic medications.  Reports that his wife is still supportive, however, she is still moving forward to with the divorce plan.  Denies delusional thinking or paranoia.  Reports he needs resources for substance abuse treatment and housing.  The LCSW and we are outpatient plans and interest and to follow up with resources for patient.  He denies SI, HI, or AVH.  Reports that anxiety is at manageable level Sleep is improved Appetite is better Concentration is  good Energy level is adequate Denies suicidal thoughts.  Further denies suicidal intent and plan.  Denies having any HI.  Denies having psychotic symptoms.   Denies having side effects to current psychiatric medications.   We discussed compliance to current medication regimen. Discussed the following psychosocial stressors: Of going through the process of divorce with the wife of 17 years.  Emotional support and active listening provided.  Encouraged to attend therapeutic milieu and unit group activities at this has proven to improve patient's mood.  Total Time spent with patient: 45 minutes  Past Psychiatric History: Information collected from patient, ED treatment Prev Dx/Sx: bipolar disorder Current Psych Provider: Yes, see above Home Meds (current): Lamictal .  See above Previous Med Trials: denies Therapy: yes see above Prior Psych Hospitalization: yes. One prior psych hospitalization due to SI Prior Self Harm: denies Prior Violence: denies  Past Medical History:  Past Medical History:  Diagnosis Date   Anxiety    Bipolar disorder (HCC)    Chronic pain syndrome    COVID-19 virus infection 12/23/2019   mild congestion and body aches   Depression    History of kidney stones    History of migraine    Hyperlipidemia    Pneumonia    RLS (restless legs syndrome)     Past Surgical History:  Procedure Laterality Date   ARTHRODESIS METATARSAL Left 09/19/2020   Procedure: ARTHRODESIS METATARSAL; LISFRANC MULTIPLE-1/2/3;  Surgeon: Pink Bridges, DPM;  Location: [facility] ORS;  Service: Podiatry;  Laterality: Left;   ARTHRODESIS METATARSAL Left 01/13/2023   Procedure: ARTHRODESIS; LISFRANC; MULTIPLE FOURTH AND FIFTH;  Surgeon:  Pink Bridges, DPM;  Location: [facility];  Service: Orthopedics/Podiatry;  Laterality: Left;   FOOT ARTHRODESIS Left 09/19/2020   Procedure: ARTHRODESIS FOOT- *POSSIBLE BONE GRAFT;  Surgeon: Pink Bridges, DPM;  Location: [facility] ORS;  Service: Podiatry;   Laterality: Left;   FOOT ARTHRODESIS Left 01/13/2023   Procedure: AUTOGRAFT HARVEST;  Surgeon: Pink Bridges, DPM;  Location: [facility];  Service: Orthopedics/Podiatry;  Laterality: Left;   KNEE ARTHROSCOPY WITH MENISCAL REPAIR Right 10/29/2014   Procedure: KNEE ARTHROSCOPY WITH MENISCAL REPAIR;  Surgeon: Molli Angelucci, MD;  Location: [facility] ORS;  Service: Orthopedics;  Laterality: Right;   OPEN REDUCTION INTERNAL FIXATION (ORIF) FOOT LISFRANC FRACTURE Left 09/19/2020   Procedure: OPEN REDUCTION INTERNAL FIXATION (ORIF) FOOT LISFRANC FRACTURE x2 4/5  PERCUTANEOUS SKELETAL FIXATION OF TARSOMETATARSAL JOINT DISLOCATION WITH MANIPULATION, X2 4/5;  Surgeon: Pink Bridges, DPM;  Location: [facility] ORS;  Service: Podiatry;  Laterality: Left;   TESTICLE SURGERY N/A    as a child   TYMPANOPLASTY Bilateral 1990   Family History:  Family History  Problem Relation Age of Onset   Depression Father    Alcohol abuse Father    Diabetes Paternal Grandfather    Heart attack Paternal Grandfather    Colon cancer Neg Hx    Family Psychiatric  History: See H&P Social History:  Social History   Substance and Sexual Activity  Alcohol Use Not Currently   Comment: rarely     Social History   Substance and Sexual Activity  Drug Use No    Social History   Socioeconomic History   Marital status: Married    Spouse name: True Fuss   Number of children: 2   Years of education: 10-   Highest education level: GED or equivalent  Occupational History   Not on file  Tobacco Use   Smoking status: Every Day    Types: Cigars, E-cigarettes   Smokeless tobacco: Never   Tobacco comments:    3 cig daily-04/29/2020  Vaping Use   Vaping status: Every Day   Substances: Nicotine , Flavoring  Substance and Sexual Activity   Alcohol use: Not Currently    Comment: rarely   Drug use: No   Sexual activity: Yes  Other Topics Concern   Not on file  Social History Narrative   Married with 2 kids. 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, prison and probation services   Social Drivers of Health   Financial Resource Strain: Medium Risk (02/14/2023)   Overall Financial Resource Strain (CARDIA)    Difficulty of Paying Living Expenses: Somewhat hard  Food Insecurity: Food Insecurity Present (07/25/2023)   Hunger Vital Sign    Worried About Running Out of Food in the Last Year: Sometimes true    Ran Out of Food in the Last Year: Sometimes true  Transportation Needs: Patient Declined (07/25/2023)   PRAPARE - Transportation    Lack of Transportation (Medical): Patient declined    Lack of Transportation (Non-Medical): Patient declined  Physical Activity: Sufficiently Active (02/14/2023)   Exercise Vital Sign    Days of Exercise per Week: 7 days    Minutes of Exercise per Session: 30 min  Stress: No Stress Concern Present (02/14/2023)   [employer] of Occupational Health - Occupational Stress Questionnaire    Feeling of Stress : Only a little  Social Connections: Moderately Isolated (02/14/2023)   Social Connection and Isolation Panel [NHANES]    Frequency of Communication with Friends and Family: More than three times a week  Frequency of Social Gatherings with Friends and Family: Three times a week    Attends Religious Services: Never    Active Member of Clubs or Organizations: No    Attends Engineer, structural: Not on file    Marital Status: Married   Additional Social History:   Sleep: Good  Appetite:  Good  Current Medications: Current Facility-Administered Medications  Medication Dose Route Frequency Provider Last Rate Last Admin   acetaminophen  (TYLENOL ) tablet 650 mg  650 mg Oral Q6H PRN Ajibola, Ene A, NP   650 mg at 07/25/23 1623   alum & mag hydroxide-simeth (MAALOX/MYLANTA) 200-200-20 MG/5ML suspension 30 mL  30 mL Oral Q4H PRN Ajibola, Ene A, NP       amitriptyline (ELAVIL) tablet 25 mg  25 mg Oral QHS Jaxton Casale C, FNP   25 mg at 07/26/23 2123   haloperidol  (HALDOL) tablet 5 mg  5 mg Oral TID PRN Ajibola, Ene A, NP       And   diphenhydrAMINE (BENADRYL) capsule 50 mg  50 mg Oral TID PRN Ajibola, Ene A, NP       haloperidol lactate (HALDOL) injection 5 mg  5 mg Intramuscular TID PRN Ajibola, Ene A, NP       And   diphenhydrAMINE (BENADRYL) injection 50 mg  50 mg Intramuscular TID PRN Ajibola, Ene A, NP       And   LORazepam (ATIVAN) injection 2 mg  2 mg Intramuscular TID PRN Ajibola, Ene A, NP       haloperidol lactate (HALDOL) injection 10 mg  10 mg Intramuscular TID PRN Ajibola, Ene A, NP       And   diphenhydrAMINE (BENADRYL) injection 50 mg  50 mg Intramuscular TID PRN Ajibola, Ene A, NP       And   LORazepam (ATIVAN) injection 2 mg  2 mg Intramuscular TID PRN Ajibola, Ene A, NP       feeding supplement (ENSURE ENLIVE / ENSURE PLUS) liquid 237 mL  237 mL Oral BID BM Zouev, Dmitri, MD   237 mL at 07/27/23 1435   gabapentin  (NEURONTIN ) capsule 300 mg  300 mg Oral QHS Casimer Russett C, FNP   300 mg at 07/26/23 2123   hydrOXYzine (ATARAX) tablet 25 mg  25 mg Oral TID PRN Ajibola, Ene A, NP   25 mg at 07/26/23 1342   lamoTRIgine  (LAMICTAL ) tablet 100 mg  100 mg Oral BID Jocsan Mcginley C, FNP   100 mg at 07/27/23 0751   magnesium  hydroxide (MILK OF MAGNESIA) suspension 30 mL  30 mL Oral Daily PRN Ajibola, Ene A, NP       propranolol  ER (INDERAL  LA) 24 hr capsule 80 mg  80 mg Oral Daily Atalia Litzinger C, FNP   80 mg at 07/27/23 0752   SUMAtriptan  (IMITREX ) tablet 25 mg  25 mg Oral Q2H PRN Joleene Burnham C, FNP       traZODone (DESYREL) tablet 200 mg  200 mg Oral QHS PRN Michaelann Gunnoe C, FNP   200 mg at 07/26/23 2123    Lab Results:  Results for orders placed or performed during the hospital encounter of 07/25/23 (from the past 48 hours)  TSH     Status: None   Collection Time: 07/25/23  6:40 PM  Result Value Ref Range   TSH 0.722 0.350 - 4.500 uIU/mL    Comment: Performed by a 3rd Generation assay with a functional sensitivity of <=0.01 uIU/mL. Performed at  [facility], [address] [place], [place] [address]   Vitamin B12     Status: None   Collection Time: 07/25/23  6:40 PM  Result Value Ref Range   Vitamin B-12 780 180 - 914 pg/mL    Comment: (NOTE) This assay is not validated for testing neonatal or myeloproliferative syndrome specimens for Vitamin B12 levels. Performed at [facility], [address] [place], [place] [address]   VITAMIN D 25 Hydroxy (Vit-D Deficiency, Fractures)     Status: None   Collection Time: 07/25/23  6:40 PM  Result Value Ref Range   Vit D, 25-Hydroxy 37.70 30 - 100 ng/mL    Comment: (NOTE) Vitamin D deficiency has been defined by the Institute of Medicine  and an Endocrine Society practice guideline as a level of serum 25-OH  vitamin D less than 20 ng/mL (1,2). The Endocrine Society went on to  further define vitamin D insufficiency as a level between 21 and 29  ng/mL (2).  1. IOM (Institute of Medicine). 2010. Dietary reference intakes for  calcium  and D. [place]: [employer]. 2. Holick MF, Binkley Guay[place], Bischoff-Ferrari HA, et al. Evaluation,  treatment, and prevention of vitamin D deficiency: an Endocrine  Society clinical practice guideline, JCEM. 2011 Jul; 96(7): 1911-30.  Performed at [facility], [address] [place], [place] [address]   Hemoglobin A1c     Status: Abnormal   Collection Time: 07/25/23  6:40 PM  Result Value Ref Range   Hgb A1c MFr Bld 4.7 (L) 4.8 - 5.6 %    Comment: (NOTE) Pre diabetes:          5.7%-6.4%  Diabetes:              >6.4%  Glycemic control for   <7.0% adults with diabetes    Mean Plasma Glucose 88.19 mg/dL    Comment: Performed at [facility], [address] [place], [place] [address]   Blood Alcohol level:  Lab Results  Component Value Date   [facility] <15 07/24/2023   ETH <10 08/20/2020   Metabolic Disorder Labs: Lab Results  Component Value Date   HGBA1C 4.7 (L)  07/25/2023   MPG 88.19 07/25/2023   No results found for: "PROLACTIN" Lab Results  Component Value Date   CHOL 230 (H) 02/17/2023   TRIG 250.0 (H) 02/17/2023   HDL 42.30 02/17/2023   CHOLHDL 5 02/17/2023   VLDL 50.0 (H) 02/17/2023   LDLCALC 138 (H) 02/17/2023   LDLCALC 130 (H) 06/12/2021   Physical Findings: AIMS:  , ,  ,  ,    CIWA:    COWS:     Musculoskeletal: Strength & Muscle Tone: within normal limits Gait & Station: normal Patient leans: N/A  Psychiatric Specialty Exam:  Presentation  General Appearance:  Appropriate for Environment; Casual; Fairly Groomed  Eye Contact: Good  Speech: Clear and Coherent  Speech Volume: Normal  Handedness: Right  Mood and Affect  Mood: Anxious; Depressed  Affect: Congruent  Thought Process  Thought Processes: Coherent  Descriptions of Associations:Intact  Orientation:Full (Time, Place and Person)  Thought Content:Logical  History of Schizophrenia/Schizoaffective disorder:No  Duration of Psychotic Symptoms:No data recorded Hallucinations:Hallucinations: None  Ideas of Reference:None  Suicidal Thoughts:Suicidal Thoughts: No  Homicidal Thoughts:Homicidal Thoughts: No  Sensorium  Memory: Immediate Good; Recent Good  Judgment: Fair  Insight: Fair  Chartered certified accountant: Fair  Attention Span: Fair  Recall: [employer] of  Knowledge: Fair  Language: Good  Psychomotor Activity  Psychomotor Activity: Psychomotor Activity: Normal  Assets  Assets: Communication Skills; Desire for Improvement; Physical Health; Resilience  Sleep  Sleep: Sleep: Good Number of Hours of Sleep: 7.5  Physical Exam: Physical Exam Vitals and nursing note reviewed.  Constitutional:      Appearance: He is normal weight.  HENT:     Head: Normocephalic.     Right Ear: External ear normal.     Left Ear: External ear normal.     Nose: Nose normal.     Mouth/Throat:     Mouth: Mucous  membranes are moist.     Pharynx: Oropharynx is clear.  Eyes:     Extraocular Movements: Extraocular movements intact.  Cardiovascular:     Rate and Rhythm: Normal rate.     Pulses: Normal pulses.  Pulmonary:     Effort: Pulmonary effort is normal.  Abdominal:     Comments: Deferred  Genitourinary:    Comments: Deferred Musculoskeletal:        General: Normal range of motion.     Cervical back: Normal range of motion.  Skin:    General: Skin is warm.  Neurological:     General: No focal deficit present.     Mental Status: He is alert and oriented to person, place, and time.  Psychiatric:        Mood and Affect: Mood normal.        Behavior: Behavior normal.    Review of Systems  Constitutional:  Negative for chills and fever.  HENT:  Negative for sore throat.   Eyes:  Negative for blurred vision.  Respiratory:  Negative for cough, sputum production, shortness of breath and wheezing.   Cardiovascular:  Negative for chest pain and palpitations.  Gastrointestinal:  Negative for heartburn and nausea.  Genitourinary:  Negative for dysuria, frequency and urgency.  Musculoskeletal:  Negative for falls.  Skin:  Negative for itching and rash.  Neurological:  Negative for dizziness, tingling and headaches.  Endo/Heme/Allergies:        See allergy listing  Psychiatric/Behavioral:  Positive for depression. Negative for hallucinations, substance abuse and suicidal ideas. The patient is nervous/anxious. The patient does not have insomnia.    Blood pressure 105/80, pulse 61, temperature 97.9 F (36.6 C), temperature source Oral, resp. rate 20, height 5\' 8"  (1.727 m), weight 67.1 kg, SpO2 95%. Body mass index is 22.5 kg/m.   Treatment Plan Summary: Daily contact with patient to assess and evaluate symptoms and progress in treatment and Medication management Treatment Plan Summary: Daily contact with patient to assess and evaluate symptoms and progress in treatment and Medication  management   Physician Treatment Plan for Primary Diagnosis: Bipolar disorder (HCC) Long Term Goal(s): Improvement in symptoms so as ready for discharge   Short Term Goals: Ability to identify changes in lifestyle to reduce recurrence of condition will improve, Ability to verbalize feelings will improve, Ability to disclose and discuss suicidal ideas, Ability to demonstrate self-control will improve, Ability to identify and develop effective coping behaviors will improve, Ability to maintain clinical measurements within normal limits will improve, Compliance with prescribed medications will improve, and Ability to identify triggers associated with substance abuse/mental health issues will improve   Physician Treatment Plan for Secondary Diagnosis: Assessment: RAI KALAL is a 38 y.o. Caucasian male with prior psychiatric history significant for bipolar 1 disorder depressed and anxiety who presents voluntarily to [facility] from [facility] ED for  worsening depression resulting in suicidal ideation in the context of being in the process of divorce by the wife of 17 years.    Principal Problem:   Bipolar disorder (HCC)   Plans: Medications: Continue home medications -- Amitriptyline 25 mg tablets p.o. at bedtime for depression -- Gabapentin  300 mg capsule p.o. at bedtime for neuropathy -- Lamictal  100 mg tablets 1 p.o. 2 times daily for bipolar -- Trazodone 100 mg tablets at bedtime for sleep as needed   Medications for other medical problems: -- Propranolol  ER 80 mg 24-hour capsule p.o. daily for blood pressure -- Imitrex  25 mg tablets may repeat in 2 hours for a maximum of 100 mg every 24 hours for Tension headache   Other PRN Medications  -Acetaminophen  650 mg every 6 as needed/mild pain  -Maalox 30 mL oral every 4 as needed/digestion  -Magnesium  hydroxide 30 mL daily as needed/mild constipation    Admission lab reviewed: CMP: Potassium 3.2 low, glucose 132  high, BUN 5 low, alkaline phosphatase 37 low, total protein 6.1 low, otherwise normal.  Lipid profile: Cholesterol 230 high, LDL 138 high, triglyceride 250 high, VLDL 50.  Otherwise normal.  CBC with differential: Within normal limits.  UDS: Positive for cocaine metabolites, positive for benzodiazepines, positive for tricyclic urine screen.  TSH: 1.29, within normal limits   New labs ordered: Hemoglobin A1c, vitamin B12, 25 hydroxyvitamin D,   EKG reviewed: Normal sinus rhythm, ventricular rate 63, QT/QTc 394/403   Continue BH Agitation Protocol  --Haldol 5 mg, oral, 3 times daily as needed, mild agitation  --Benadryl 50 mg, oral, 3 times daily as needed, mild agitation                                    OR   --Haldol injection 5 mg, IM, 3 times daily as needed, moderate agitation  --Benadryl injection 50 mg, IM, 3 times daily as needed, moderate agitation  --Ativan injection 2 mg, IM, 3 times daily as needed, moderate agitation                                      OR  --Haldol injection 10 mg, IM, 3 times daily as needed, severe agitation  --Benadryl injection 50 mg, IM, 3 times daily as needed, severe agitation  --Ativan injection 2 mg, IM, 3 times daily as needed, severe agitation    --  The risks/benefits/side-effects/alternatives to this medication were discussed in detail with the patient and time was given for questions. The patient consents to medication trial.   -- Metabolic profile and EKG monitoring obtained while on an atypical antipsychotic (BMI: Lipid Panel: HbgA1c: QTc:)   -- Encouraged patient to participate in unit milieu and in scheduled group therapies     Safety and Monitoring:  Voluntary admission to inpatient psychiatric unit for safety, stabilization and treatment  Daily contact with patient to assess and evaluate symptoms and progress in treatment  Patient's case to be discussed in multi-disciplinary team meeting  Observation Level : q15 minute checks  Vital signs:  q12 hours  Precautions: suicide, but pt currently verbally contracts for safety on unit?    Laurence Pons, FNP 07/27/2023, 3:50 PM Patient ID: Retia Castellani, male   DOB: [birth year], 38 y.o.   MRN: [id number]

## 2023-07-27 NOTE — Progress Notes (Signed)
   07/27/23 0000  Psych Admission Type (Psych Patients Only)  Admission Status Voluntary  Psychosocial Assessment  Patient Complaints Anxiety;Depression;Sleep disturbance  Eye Contact Fair  Facial Expression Animated  Affect Appropriate to circumstance  Speech Logical/coherent  Interaction Assertive  Motor Activity Other (Comment) (WNL)  Appearance/Hygiene Unremarkable  Behavior Characteristics Cooperative;Appropriate to situation  Mood Pleasant  Thought Process  Coherency WDL  Content WDL  Delusions None reported or observed  Perception WDL  Hallucination None reported or observed  Judgment WDL  Confusion None  Danger to Self  Current suicidal ideation? Denies  Self-Injurious Behavior No self-injurious ideation or behavior indicators observed or expressed   Agreement Not to Harm Self Yes  Description of Agreement Verbal contract for safety  Danger to Others  Danger to Others None reported or observed

## 2023-07-27 NOTE — Progress Notes (Signed)
 D:  Patient's self inventory sheet, patient sleeps good, sleep medication helpful.  Good appetite, normal energy level, good concentration.  Rated depression 2, denied hopeless, anxiety 3.  Denied withdrawals.  Denied SI.  Physical problems, pain.  Yes physical pain, L ankle, pain #5.  Goal is make phone calls to resources given by SW.  Plans to talk to wife.  Any options for helping with pbs.  Maybe talk about staying until after birthday. A:  Medications administered per MD orders.  Emotional support and encouragement given patient. R:  Denied SI and HI, contracts for safety.  Denied A/V hallucinations.  Safety maintained with 15 minute checks.

## 2023-07-27 NOTE — Plan of Care (Signed)
   Problem: Education: Goal: Knowledge of [facility] Education information/materials will improve Outcome: Progressing Goal: Emotional status will improve Outcome: Progressing Goal: Mental status will improve Outcome: Progressing Goal: Verbalization of understanding the information provided will improve Outcome: Progressing

## 2023-07-27 NOTE — Plan of Care (Signed)
 Nurse discussed anxiety, depression and coping skills with patient.

## 2023-07-28 ENCOUNTER — Encounter (HOSPITAL_COMMUNITY): Payer: Self-pay | Admitting: Psychiatry

## 2023-07-28 DIAGNOSIS — F3132 Bipolar disorder, current episode depressed, moderate: Secondary | ICD-10-CM | POA: Diagnosis not present

## 2023-07-28 NOTE — B[facility] Group Notes (Signed)
 Spirituality Group   Focus of discussion: Gratitude and Strength Awareness   Process: Following theoretical framework of group therapy of Irvin Yalom and further informed by Rogerian and Relational Cultural Theory approaches, participants invited to name:   *Sources of gratitude (internal>external)  *Articulate gratitude for self  *Name a personal strength/gift/skill  *Locate points of resonance among group members/engage the "here and now"   Conclude with grounding/breathwork     Observations: Joel Carr was passively engaged in the group discussion.  [name] L. Minetta Aly, M.Div  [phone]

## 2023-07-28 NOTE — B[facility] Group Notes (Signed)
 [facility] Group Notes:  (Nursing/MHT/Case Management/Adjunct)  Date:  07/28/2023  Time:  9:03 PM  Type of Therapy:   Wrap-up group  Participation Level:  Active  Participation Quality:  Appropriate  Affect:  Appropriate  Cognitive:  Appropriate  Insight:  Appropriate  Engagement in Group:  Engaged  Modes of Intervention:  Education  Summary of Progress/Problems: Goal to talk to Child psychotherapist. Rated day 6/10.  Joel Carr 07/28/2023, 9:03 PM

## 2023-07-28 NOTE — B[facility] Group Notes (Signed)
 The focus of this group is to help patients establish daily goals to achieve during treatment and discuss how the patient can incorporate goal setting into their daily lives to aide in recovery.             Scale 1-10 0   Goal: Keep communicating with my social worker and wife/ future ex wife on my plans which released

## 2023-07-28 NOTE — Group Note (Signed)
 Occupational Therapy Group Note  Group Topic:Coping Skills  Group Date: 07/28/2023 Start Time: 1358 End Time: 1432 Facilitators: Lynnda Sas, OT   Group Description: Group encouraged increased engagement and participation through discussion and activity focused on "Coping Ahead." Patients were split up into teams and selected a card from a stack of positive coping strategies. Patients were instructed to act out/charade the coping skill for other peers to guess and receive points for their team. Discussion followed with a focus on identifying additional positive coping strategies and patients shared how they were going to cope ahead over the weekend while continuing hospitalization stay.  Therapeutic Goal(s): Identify positive vs negative coping strategies. Identify coping skills to be used during hospitalization vs coping skills outside of hospital/at home Increase participation in therapeutic group environment and promote engagement in treatment   Participation Level: Engaged   Participation Quality: Independent   Behavior: Appropriate   Speech/Thought Process: Relevant   Affect/Mood: Appropriate   Insight: Fair   Judgement: Improved      Modes of Intervention: Education  Patient Response to Interventions:  Attentive   Plan: Continue to engage patient in OT groups 2 - 3x/week.  07/28/2023  Lynnda Sas, OT  Trennon Torbeck, OT

## 2023-07-28 NOTE — Plan of Care (Signed)
   Problem: Education: Goal: Emotional status will improve Outcome: Progressing Goal: Mental status will improve Outcome: Progressing   Problem: Activity: Goal: Interest or engagement in activities will improve Outcome: Progressing Goal: Sleeping patterns will improve Outcome: Progressing   Problem: Coping: Goal: Ability to verbalize frustrations and anger appropriately will improve Outcome: Progressing Goal: Ability to demonstrate self-control will improve Outcome: Progressing   Problem: Safety: Goal: Periods of time without injury will increase Outcome: Progressing

## 2023-07-28 NOTE — Group Note (Signed)
 LCSW Group Therapy Note   Group Date: 07/28/2023 Start Time: 1100 End Time: 1200   Participation:  patient was present.  He listened and was respectful, and minimally participated in the discussion.    Type of Therapy:  Group Therapy   Title: Healing Flames: Navigating Anger with Compassion  Objective:  Foster self-awareness and promote compassion toward oneself and others when dealing with anger.  Goals: Help participants understand the underlying emotions and needs fueling anger. Provide coping strategies for healthier emotional expression and anger management.  Summary: This session explored anger as a volcano - an explosion driven by deeper feelings and unmet needs. Participants learned to identify anger triggers and underlying emotions, then practiced coping strategies like deep breathing, physical activity, and journaling. The group discussed healthy ways to manage anger before it escalates, using both personal reflection and shared experiences.  Therapeutic Modalities: Cognitive Behavioral Therapy (CBT): Challenging thoughts that fuel anger. Mindfulness: Increasing awareness of emotions and sensations.   Joel Carr, LCSWA 07/28/2023  1:00 PM

## 2023-07-28 NOTE — Plan of Care (Signed)
   Problem: Education: Goal: Knowledge of [facility] Education information/materials will improve Outcome: Progressing Goal: Verbalization of understanding the information provided will improve Outcome: Progressing

## 2023-07-28 NOTE — Progress Notes (Signed)
   07/27/23 2045  Psych Admission Type (Psych Patients Only)  Admission Status Voluntary  Psychosocial Assessment  Patient Complaints Anxiety  Eye Contact Fair  Facial Expression Animated  Affect UTA  Speech Logical/coherent  Interaction Assertive  Appearance/Hygiene Unremarkable  Behavior Characteristics Cooperative  Mood Pleasant (Pt shared that he was able to be in communication with his Ex effectively even though there has been a strained relationship. He endorsed they can still be amicable for the kids sake. Pt denied SI/HI/AVH)  Thought Process  Coherency WDL  Content WDL  Delusions None reported or observed  Perception WDL  Hallucination None reported or observed  Judgment WDL  Confusion None  Danger to Self  Current suicidal ideation? Denies  Self-Injurious Behavior No self-injurious ideation or behavior indicators observed or expressed   Agreement Not to Harm Self Yes  Description of Agreement Verbal  Danger to Others  Danger to Others None reported or observed

## 2023-07-28 NOTE — Progress Notes (Signed)
 [facility] MD Progress Note  07/28/2023 10:59 AM ALFERD ENOCH  MRN:  [id number]  Principal Problem: Bipolar disorder [facility]) Diagnosis: Principal Problem:   Bipolar disorder (HCC)  Reason for admission: BAUTISTA SPIEGLER is a 38 y.o. Caucasian male with prior psychiatric history significant for bipolar 1 disorder depressed and anxiety who presents voluntarily to [facility] from [facility] ED for worsening depression resulting in suicidal ideation in the context of being in the process of divorce by the wife of 17 years.  Chart Review from last 24 hours:  The patient's chart was reviewed and nursing notes were reviewed. The patient's case was discussed in multidisciplinary team meeting. Per [facility] patient is compliant with His psychotropic medications.  No signs reviewed without critical values.  Required trazodone  x 1 for insomnia, hydroxyzine  x 1 for anxiety.  Today's assessment notes: Patient is seen and examined on the unit sitting up on a chair.  Reports his mood is less depressed, but, experiencing sadness because of his life situation.  Polly Brink reinforces that his birthday is on [birth year], and requesting not to be discharged before then due to his increasing sadness and unable to contract for safety due to his multiple life stressors including ongoing divorce process and several losses in his family.  Patient is alert, calm, cooperative, oriented to person, time, place, and situation.  Chart reviewed and information shared with the treatment team and attending psychiatrist Dr. Linnie Riches. He is compliant with his psychotropic medications.  Reports communicating with his wife who is still supportive, however, she is still moving forward to with the divorce plan. Reports he needs resources for substance abuse treatment and housing.  The LCSW of following up with outpatient resources for patient. Denies delusional thinking or paranoia and further denies SI, HI, or AVH. Reports that anxiety  is at manageable level. Sleep is improved and appetite is better. Concentration is good Energy level is adequate Denies suicidal thoughts and denies SI intent or plan Denies having any HI.  Denies having psychotic symptoms.   Denies having side effects to current psychiatric medications.   We discussed compliance to current medication regimen. Discussed the following psychosocial stressors: Of going through the process of divorce with the wife of 17 years.  Emotional support and active listening provided.  Encouraged to attend therapeutic milieu and unit group activities at this has proven to improve patient's mood.  Total Time spent with patient: 45 minutes  Past Psychiatric History: Information collected from patient, ED treatment Prev Dx/Sx: bipolar disorder Current Psych Provider: Yes, see above Home Meds (current): Lamictal .  See above Previous Med Trials: denies Therapy: yes see above Prior Psych Hospitalization: yes. One prior psych hospitalization due to SI Prior Self Harm: denies Prior Violence: denies  Past Medical History:  Past Medical History:  Diagnosis Date   Anxiety    Bipolar disorder (HCC)    Chronic pain syndrome    COVID-19 virus infection 12/23/2019   mild congestion and body aches   Depression    History of kidney stones    History of migraine    Hyperlipidemia    Pneumonia    RLS (restless legs syndrome)     Past Surgical History:  Procedure Laterality Date   ARTHRODESIS METATARSAL Left 09/19/2020   Procedure: ARTHRODESIS METATARSAL; LISFRANC MULTIPLE-1/2/3;  Surgeon: Pink Bridges, DPM;  Location: [facility] OR[facility];  Service: Podiatry;  Laterality: Left;   ARTHRODESIS METATARSAL Left 01/13/2023   Procedure: ARTHRODESIS; LISFRANC; MULTIPLE FOURTH AND FIFTH;  Surgeon: Pink Bridges, DPM;  Location: [facility];  Service: Orthopedics/Podiatry;  Laterality: Left;   FOOT ARTHRODESIS Left 09/19/2020   Procedure: ARTHRODESIS FOOT- *POSSIBLE BONE GRAFT;   Surgeon: Pink Bridges, DPM;  Location: [facility];  Service: Podiatry;  Laterality: Left;   FOOT ARTHRODESIS Left 01/13/2023   Procedure: AUTOGRAFT HARVEST;  Surgeon: Pink Bridges, DPM;  Location: [facility];  Service: Orthopedics/Podiatry;  Laterality: Left;   KNEE ARTHROSCOPY WITH MENISCAL REPAIR Right 10/29/2014   Procedure: KNEE ARTHROSCOPY WITH MENISCAL REPAIR;  Surgeon: Molli Angelucci, MD;  Location: [facility];  Service: Orthopedics;  Laterality: Right;   OPEN REDUCTION INTERNAL FIXATION (ORIF) FOOT LISFRANC FRACTURE Left 09/19/2020   Procedure: OPEN REDUCTION INTERNAL FIXATION (ORIF) FOOT LISFRANC FRACTURE x2 4/5  PERCUTANEOUS SKELETAL FIXATION OF TARSOMETATARSAL JOINT DISLOCATION WITH MANIPULATION, X2 4/5;  Surgeon: Pink Bridges, DPM;  Location: [facility] OR[facility];  Service: Podiatry;  Laterality: Left;   TESTICLE SURGERY N/A    as a child   TYMPANOPLASTY Bilateral 1990   Family History:  Family History  Problem Relation Age of Onset   Depression Father    Alcohol abuse Father    Diabetes Paternal Grandfather    Heart attack Paternal Grandfather    Colon cancer Neg Hx    Family Psychiatric  History: See H&P Social History:  Social History   Substance and Sexual Activity  Alcohol Use Not Currently   Comment: rarely     Social History   Substance and Sexual Activity  Drug Use No    Social History   Socioeconomic History   Marital status: Married    Spouse name: True Fuss   Number of children: 2   Years of education: 10-   Highest education level: GED or equivalent  Occupational History   Not on file  Tobacco Use   Smoking status: Every Day    Types: Cigars, E-cigarettes   Smokeless tobacco: Never   Tobacco comments:    3 cig daily-04/29/2020  Vaping Use   Vaping status: Every Day   Substances: Nicotine , Flavoring  Substance and Sexual Activity   Alcohol use: Not Currently    Comment: rarely   Drug use: No   Sexual activity: Yes  Other Topics Concern   Not on  file  Social History Narrative   Married with 2 kids. 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, prison and probation services   Social Drivers of Health   Financial Resource Strain: Medium Risk (02/14/2023)   Overall Financial Resource Strain (CARDIA)    Difficulty of Paying Living Expenses: Somewhat hard  Food Insecurity: Food Insecurity Present (07/25/2023)   Hunger Vital Sign    Worried About Running Out of Food in the Last Year: Sometimes true    Ran Out of Food in the Last Year: Sometimes true  Transportation Needs: Patient Declined (07/25/2023)   PRAPARE - Transportation    Lack of Transportation (Medical): Patient declined    Lack of Transportation (Non-Medical): Patient declined  Physical Activity: Sufficiently Active (02/14/2023)   Exercise Vital Sign    Days of Exercise per Week: 7 days    Minutes of Exercise per Session: 30 min  Stress: No Stress Concern Present (02/14/2023)   [employer] of Occupational Health - Occupational Stress Questionnaire    Feeling of Stress : Only a little  Social Connections: Moderately Isolated (02/14/2023)   Social Connection and Isolation Panel [NHANES]    Frequency of Communication with Friends and Family: More than three times a week  Frequency of Social Gatherings with Friends and Family: Three times a week    Attends Religious Services: Never    Active Member of Clubs or Organizations: No    Attends Engineer, structural: Not on file    Marital Status: Married   Additional Social History:   Sleep: Good  Appetite:  Good  Current Medications: Current Facility-Administered Medications  Medication Dose Route Frequency Provider Last Rate Last Admin   acetaminophen  (TYLENOL ) tablet 650 mg  650 mg Oral Q6H PRN Ajibola, Ene A, NP   650 mg at 07/25/23 1623   alum & mag hydroxide-simeth (MAALOX/MYLANTA) 200-200-20 MG/5ML suspension 30 mL  30 mL Oral Q4H PRN Ajibola, Ene A, NP       amitriptyline  (ELAVIL ) tablet 25 mg  25 mg  Oral QHS Jermine Bibbee C, FNP   25 mg at 07/27/23 2124   haloperidol  (HALDOL ) tablet 5 mg  5 mg Oral TID PRN Ajibola, Ene A, NP       And   diphenhydrAMINE  (BENADRYL ) capsule 50 mg  50 mg Oral TID PRN Ajibola, Ene A, NP       haloperidol  lactate (HALDOL ) injection 5 mg  5 mg Intramuscular TID PRN Ajibola, Ene A, NP       And   diphenhydrAMINE  (BENADRYL ) injection 50 mg  50 mg Intramuscular TID PRN Ajibola, Ene A, NP       And   LORazepam  (ATIVAN ) injection 2 mg  2 mg Intramuscular TID PRN Ajibola, Ene A, NP       haloperidol  lactate (HALDOL ) injection 10 mg  10 mg Intramuscular TID PRN Ajibola, Ene A, NP       And   diphenhydrAMINE  (BENADRYL ) injection 50 mg  50 mg Intramuscular TID PRN Ajibola, Ene A, NP       And   LORazepam  (ATIVAN ) injection 2 mg  2 mg Intramuscular TID PRN Ajibola, Ene A, NP       feeding supplement (ENSURE ENLIVE / ENSURE PLUS) liquid 237 mL  237 mL Oral BID BM Zouev, Dmitri, MD   237 mL at 07/28/23 0939   gabapentin  (NEURONTIN ) capsule 300 mg  300 mg Oral QHS Elsi Stelzer C, FNP   300 mg at 07/27/23 2124   hydrOXYzine  (ATARAX ) tablet 25 mg  25 mg Oral TID PRN Ajibola, Ene A, NP   25 mg at 07/26/23 1342   lamoTRIgine  (LAMICTAL ) tablet 100 mg  100 mg Oral BID Velena Keegan C, FNP   100 mg at 07/28/23 1610   magnesium  hydroxide (MILK OF MAGNESIA) suspension 30 mL  30 mL Oral Daily PRN Ajibola, Ene A, NP       propranolol  ER (INDERAL  LA) 24 hr capsule 80 mg  80 mg Oral Daily Khayman Kirsch C, FNP   80 mg at 07/28/23 0752   SUMAtriptan  (IMITREX ) tablet 25 mg  25 mg Oral Q2H PRN Larz Mark C, FNP       traZODone  (DESYREL ) tablet 200 mg  200 mg Oral QHS PRN Rynlee Lisbon C, FNP   200 mg at 07/27/23 2125    Lab Results:  No results found for this or any previous visit (from the past 48 hours).  Blood Alcohol level:  Lab Results  Component Value Date   [facility] <15 07/24/2023   ETH <10 08/20/2020   Metabolic Disorder Labs: Lab Results  Component Value Date   HGBA1C 4.7 (L)  07/25/2023   MPG 88.19 07/25/2023   No results found for: "PROLACTIN" Lab  Results  Component Value Date   CHOL 230 (H) 02/17/2023   TRIG 250.0 (H) 02/17/2023   HDL 42.30 02/17/2023   CHOLHDL 5 02/17/2023   VLDL 50.0 (H) 02/17/2023   LDLCALC 138 (H) 02/17/2023   LDLCALC 130 (H) 06/12/2021   Physical Findings: AIMS:  , ,  ,  ,    CIWA:    COWS:     Musculoskeletal: Strength & Muscle Tone: within normal limits Gait & Station: normal Patient leans: N/A  Psychiatric Specialty Exam:  Presentation  General Appearance:  Casual; Fairly Groomed  Eye Contact: Good  Speech: Clear and Coherent  Speech Volume: Normal  Handedness: Right  Mood and Affect  Mood: Depressed (Report sadness)  Affect: Congruent  Thought Process  Thought Processes: Coherent  Descriptions of Associations:Intact  Orientation:Full (Time, Place and Person)  Thought Content:Logical  History of Schizophrenia/Schizoaffective disorder:No  Duration of Psychotic Symptoms:No data recorded Hallucinations:Hallucinations: None  Ideas of Reference:None  Suicidal Thoughts:Suicidal Thoughts: No  Homicidal Thoughts:Homicidal Thoughts: No  Sensorium  Memory: Immediate Good; Recent Good  Judgment: Fair  Insight: Fair  Art therapist  Concentration: Good  Attention Span: FairWaynard Hailstone  Recall: Fair  Fund of Knowledge: Fair  Language: Good  Psychomotor Activity  Psychomotor Activity: Psychomotor Activity: Normal  Assets  Assets: Communication Skills; Desire for Improvement; Physical Health; Resilience  Sleep  Sleep: Sleep: Good Number of Hours of Sleep: 7.5  Physical Exam: Physical Exam Vitals and nursing note reviewed.  Constitutional:      Appearance: He is normal weight.  HENT:     Head: Normocephalic.     Right Ear: External ear normal.     Left Ear: External ear normal.     Nose: Nose normal.     Mouth/Throat:     Mouth: Mucous membranes are moist.      Pharynx: Oropharynx is clear.  Eyes:     Extraocular Movements: Extraocular movements intact.  Cardiovascular:     Rate and Rhythm: Normal rate.     Pulses: Normal pulses.  Pulmonary:     Effort: Pulmonary effort is normal.  Abdominal:     Comments: Deferred  Genitourinary:    Comments: Deferred Musculoskeletal:        General: Normal range of motion.     Cervical back: Normal range of motion.  Skin:    General: Skin is warm.  Neurological:     General: No focal deficit present.     Mental Status: He is alert and oriented to person, place, and time.  Psychiatric:        Mood and Affect: Mood normal.        Behavior: Behavior normal.    Review of Systems  Constitutional:  Negative for chills and fever.  HENT:  Negative for sore throat.   Eyes:  Negative for blurred vision.  Respiratory:  Negative for cough, sputum production, shortness of breath and wheezing.   Cardiovascular:  Negative for chest pain and palpitations.  Gastrointestinal:  Negative for heartburn and nausea.  Genitourinary:  Negative for dysuria, frequency and urgency.  Musculoskeletal:  Negative for falls.  Skin:  Negative for itching and rash.  Neurological:  Negative for dizziness, tingling and headaches.  Endo/Heme/Allergies:        See allergy listing  Psychiatric/Behavioral:  Positive for depression. Negative for hallucinations, substance abuse and suicidal ideas. The patient is nervous/anxious. The patient does not have insomnia.    Blood pressure 103/77, pulse 62, temperature 97.9 F (36.6 C), temperature  source Oral, resp. rate 16, height 5\' 8"  (1.727 m), weight 67.1 kg, SpO2 100%. Body mass index is 22.5 kg/m.   Treatment Plan Summary: Daily contact with patient to assess and evaluate symptoms and progress in treatment and Medication management Treatment Plan Summary: Daily contact with patient to assess and evaluate symptoms and progress in treatment and Medication management   Physician  Treatment Plan for Primary Diagnosis: Bipolar disorder (HCC) Long Term Goal(s): Improvement in symptoms so as ready for discharge   Short Term Goals: Ability to identify changes in lifestyle to reduce recurrence of condition will improve, Ability to verbalize feelings will improve, Ability to disclose and discuss suicidal ideas, Ability to demonstrate self-control will improve, Ability to identify and develop effective coping behaviors will improve, Ability to maintain clinical measurements within normal limits will improve, Compliance with prescribed medications will improve, and Ability to identify triggers associated with substance abuse/mental health issues will improve   Physician Treatment Plan for Secondary Diagnosis: Assessment: SHYQUAN SHUMARD is a 38 y.o. Caucasian male with prior psychiatric history significant for bipolar 1 disorder depressed and anxiety who presents voluntarily to [facility] from [facility] ED for worsening depression resulting in suicidal ideation in the context of being in the process of divorce by the wife of 17 years.    Principal Problem:   Bipolar disorder (HCC)   Plans: Medications: Continue home medications -- Amitriptyline  25 mg tablets p.o. at bedtime for depression -- Gabapentin  300 mg capsule p.o. at bedtime for neuropathy -- Lamictal  100 mg tablets 1 p.o. 2 times daily for bipolar -- Trazodone  100 mg tablets at bedtime for sleep as needed   Medications for other medical problems: -- Propranolol  ER 80 mg 24-hour capsule p.o. daily for blood pressure -- Imitrex  25 mg tablets may repeat in 2 hours for a maximum of 100 mg every 24 hours for Tension headache   Other PRN Medications  -Acetaminophen  650 mg every 6 as needed/mild pain  -Maalox 30 mL oral every 4 as needed/digestion  -Magnesium  hydroxide 30 mL daily as needed/mild constipation    Admission lab reviewed: CMP: Potassium 3.2 low, glucose 132 high, BUN 5 low, alkaline  phosphatase 37 low, total protein 6.1 low, otherwise normal.  Lipid profile: Cholesterol 230 high, LDL 138 high, triglyceride 250 high, VLDL 50.  Otherwise normal.  CBC with differential: Within normal limits.  UDS: Positive for cocaine metabolites, positive for benzodiazepines, positive for tricyclic urine screen.  TSH: 1.29, within normal limits   New labs ordered: Hemoglobin A1c, vitamin B12, 25 hydroxyvitamin D,   EKG reviewed: Normal sinus rhythm, ventricular rate 63, QT/QTc 394/403   Continue BH Agitation Protocol  --Haldol  5 mg, oral, 3 times daily as needed, mild agitation  --Benadryl  50 mg, oral, 3 times daily as needed, mild agitation                                    OR   --Haldol  injection 5 mg, IM, 3 times daily as needed, moderate agitation  --Benadryl  injection 50 mg, IM, 3 times daily as needed, moderate agitation  --Ativan  injection 2 mg, IM, 3 times daily as needed, moderate agitation                                      OR  --Haldol   injection 10 mg, IM, 3 times daily as needed, severe agitation  --Benadryl  injection 50 mg, IM, 3 times daily as needed, severe agitation  --Ativan  injection 2 mg, IM, 3 times daily as needed, severe agitation    --  The risks/benefits/side-effects/alternatives to this medication were discussed in detail with the patient and time was given for questions. The patient consents to medication trial.   -- Metabolic profile and EKG monitoring obtained while on an atypical antipsychotic (BMI: Lipid Panel: HbgA1c: QTc:)   -- Encouraged patient to participate in unit milieu and in scheduled group therapies     Safety and Monitoring:  Voluntary admission to inpatient psychiatric unit for safety, stabilization and treatment  Daily contact with patient to assess and evaluate symptoms and progress in treatment  Patient's case to be discussed in multi-disciplinary team meeting  Observation Level : q15 minute checks  Vital signs: q12 hours  Precautions:  suicide, but pt currently verbally contracts for safety on unit?    Laurence Pons, FNP 07/28/2023, 10:59 AM Patient ID: Retia Castellani, male   DOB: [birth year], 38 y.o.   MRN: [id number] Patient ID: [id number], male   DOB: 1985/06/21, 38 y.o.   MRN: [id number]

## 2023-07-28 NOTE — Progress Notes (Addendum)
 D. Pt is friendly upon approach- rated his depression,hopelessness and anxiety a 0/0/3, respectively. Pt has been visible in the milieu, observed attending groups . Pt currently denies SI/HI and AVH and does not appear to be responding to internal stimuli. A. Labs and vitals monitored. Pt given and educated on medications. Pt supported emotionally and encouraged to express concerns and ask questions.   R. Pt remains safe with 15 minute checks. Will continue POC.    07/28/23 1300  Psych Admission Type (Psych Patients Only)  Admission Status Voluntary  Psychosocial Assessment  Patient Complaints Anxiety  Eye Contact Fair  Facial Expression Animated  Affect Appropriate to circumstance  Speech Logical/coherent  Interaction Assertive  Motor Activity Other (Comment) (steady gait)  Appearance/Hygiene Unremarkable  Behavior Characteristics Cooperative;Calm  Mood Pleasant  Thought Process  Coherency WDL  Content WDL  Delusions None reported or observed  Perception WDL  Hallucination None reported or observed  Judgment WDL  Confusion None  Danger to Self  Current suicidal ideation? Denies  Self-Injurious Behavior No self-injurious ideation or behavior indicators observed or expressed   Danger to Others  Danger to Others None reported or observed

## 2023-07-28 NOTE — Progress Notes (Signed)
 CSW reached out to [facility] admissions to follow-up on referral. They were unable to provide CSW with an update due to not having a release on file. Will with [facility] admissions states that Pt will need to call and follow-up on admission status.    CSW team will continue to follow.   Dannisha Eckmann N Kevonte Vanecek, LCSW 07/28/23 3:33 PM

## 2023-07-29 ENCOUNTER — Encounter (HOSPITAL_COMMUNITY): Payer: Self-pay

## 2023-07-29 DIAGNOSIS — F0634 Mood disorder due to known physiological condition with mixed features: Secondary | ICD-10-CM | POA: Diagnosis not present

## 2023-07-29 NOTE — Group Note (Signed)
 Recreation Therapy Group Note   Group Topic:Leisure Education  Group Date: 07/29/2023 Start Time: 0930 End Time: 1005 Facilitators: Clayburn Weekly-McCall, LRT,CTRS Location: [address] Dayroom   Group Topic: Leisure Education   Goal Area(s) Addresses:  Patient will successfully identify positive leisure and recreation activities.  Patient will acknowledge benefits of participation in healthy leisure activities post discharge.  Patient will actively work with peers toward a shared goal.   Intervention: Cooperative Group Game    Activity: Keep It Contractor. Patients were seated in a circle and given a beach ball. Patients were to toss the ball back and forth between each other. Patients were to remain seated throughout group. LRT timed patients on how long they could keep the ball moving without it coming to a stop. If the ball came to a complete stop, LRT would restart the timer.   Education:  Teacher, English as a foreign language, Leisure as Merchant navy officer, Programmer, applications, Building control surveyor   Education Outcome: Acknowledges education/In group clarification offered/Needs additional education   Affect/Mood: Appropriate   Participation Level: Engaged   Participation Quality: Independent   Behavior: Appropriate   Speech/Thought Process: Focused   Insight: Good   Judgement: Good   Modes of Intervention: Cooperative Play   Patient Response to Interventions:  Engaged   Education Outcome:  In group clarification offered    Clinical Observations/Individualized Feedback: Pt was bright, engaged and social with peers. Pt was focused and intent on keeping the ball in play.    Plan: Continue to engage patient in RT group sessions 2-3x/week.   Khyle Goodell-McCall, LRT,CTRS 07/29/2023 1:10 PM

## 2023-07-29 NOTE — Progress Notes (Signed)
 [facility] MD Progress Note  07/29/2023 8:34 AM NASON GRINDSTAFF  MRN:  [id number]  Principal Problem: Bipolar disorder [facility]) Diagnosis: Principal Problem:   Bipolar disorder (HCC)  Reason for admission: BRAHIM OGANDO is a 38 y.o. Caucasian male with prior psychiatric history significant for bipolar 1 disorder depressed and anxiety who presents voluntarily to [facility] from [facility] ED for worsening depression resulting in suicidal ideation in the context of being in the process of divorce by the wife of 17 years.  Chart Review from last 24 hours:  The patient's chart was reviewed and nursing notes were reviewed. The patient's case was discussed in multidisciplinary team meeting. Per [facility] patient is compliant with His psychotropic medications.  No signs reviewed without critical values.  Required trazodone  x 1 for insomnia, hydroxyzine  x 1 for anxiety.  Today's assessment notes: Patient feels "better day by day". Slept good. Eating well. Denies somatic symptoms. Denies medication side effects. Patient feels supported by both his wife and his Child psychotherapist, who is trying to set him up with a substance use program in [place]. Has known his wife for 25 years -- understands the reasoning behind the divorce, but values her support. Would like not to be discharged before his [birth year] 5[birth year], to give them time to set this up.  Denies suicidal thoughts and denies SI intent or plan. Last suicidal thinking 4/28. Denies having any HI.  Denies having psychotic symptoms.   Denies having side effects to current psychiatric medications.   We discussed compliance to current medication regimen. Discussed the following psychosocial stressors: Of going through the process of divorce with the wife of 17 years.  Emotional support and active listening provided.  Encouraged to attend therapeutic milieu and unit group activities at this has proven to improve patient's mood.  Total Time spent with  patient: 45 minutes  Past Psychiatric History: Information collected from patient, ED treatment Prev Dx/Sx: bipolar disorder Current Psych Provider: Yes, see above Home Meds (current): Lamictal .  See above Previous Med Trials: denies Therapy: yes see above Prior Psych Hospitalization: yes. One prior psych hospitalization due to SI Prior Self Harm: denies Prior Violence: denies  Past Medical History:  Past Medical History:  Diagnosis Date   Anxiety    Bipolar disorder (HCC)    Chronic pain syndrome    COVID-19 virus infection 12/23/2019   mild congestion and body aches   Depression    History of kidney stones    History of migraine    Hyperlipidemia    Pneumonia    RLS (restless legs syndrome)     Past Surgical History:  Procedure Laterality Date   ARTHRODESIS METATARSAL Left 09/19/2020   Procedure: ARTHRODESIS METATARSAL; LISFRANC MULTIPLE-1/2/3;  Surgeon: Pink Bridges, DPM;  Location: [facility] ORS;  Service: Podiatry;  Laterality: Left;   ARTHRODESIS METATARSAL Left 01/13/2023   Procedure: ARTHRODESIS; LISFRANC; MULTIPLE FOURTH AND FIFTH;  Surgeon: Pink Bridges, DPM;  Location: [facility];  Service: Orthopedics/Podiatry;  Laterality: Left;   FOOT ARTHRODESIS Left 09/19/2020   Procedure: ARTHRODESIS FOOT- *POSSIBLE BONE GRAFT;  Surgeon: Pink Bridges, DPM;  Location: [facility] ORS;  Service: Podiatry;  Laterality: Left;   FOOT ARTHRODESIS Left 01/13/2023   Procedure: AUTOGRAFT HARVEST;  Surgeon: Pink Bridges, DPM;  Location: [facility];  Service: Orthopedics/Podiatry;  Laterality: Left;   KNEE ARTHROSCOPY WITH MENISCAL REPAIR Right 10/29/2014   Procedure: KNEE ARTHROSCOPY WITH MENISCAL REPAIR;  Surgeon: Molli Angelucci, MD;  Location: [facility] ORS;  Service: Orthopedics;  Laterality: Right;   OPEN REDUCTION INTERNAL FIXATION (ORIF) FOOT LISFRANC FRACTURE Left 09/19/2020   Procedure: OPEN REDUCTION INTERNAL FIXATION (ORIF) FOOT LISFRANC FRACTURE x2 4/5  PERCUTANEOUS SKELETAL  FIXATION OF TARSOMETATARSAL JOINT DISLOCATION WITH MANIPULATION, X2 4/5;  Surgeon: Pink Bridges, DPM;  Location: [facility] ORS;  Service: Podiatry;  Laterality: Left;   TESTICLE SURGERY N/A    as a child   TYMPANOPLASTY Bilateral 1990   Family History:  Family History  Problem Relation Age of Onset   Depression Father    Alcohol abuse Father    Diabetes Paternal Grandfather    Heart attack Paternal Grandfather    Colon cancer Neg Hx    Family Psychiatric  History: See H&P Social History:  Social History   Substance and Sexual Activity  Alcohol Use Not Currently   Comment: rarely     Social History   Substance and Sexual Activity  Drug Use No    Social History   Socioeconomic History   Marital status: Married    Spouse name: True Fuss   Number of children: 2   Years of education: 10-   Highest education level: GED or equivalent  Occupational History   Not on file  Tobacco Use   Smoking status: Every Day    Types: Cigars, E-cigarettes   Smokeless tobacco: Never   Tobacco comments:    3 cig daily-04/29/2020  Vaping Use   Vaping status: Every Day   Substances: Nicotine , Flavoring  Substance and Sexual Activity   Alcohol use: Not Currently    Comment: rarely   Drug use: No   Sexual activity: Yes  Other Topics Concern   Not on file  Social History Narrative   Married with 2 kids. 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, prison and probation services   Social Drivers of Health   Financial Resource Strain: Medium Risk (02/14/2023)   Overall Financial Resource Strain (CARDIA)    Difficulty of Paying Living Expenses: Somewhat hard  Food Insecurity: Food Insecurity Present (07/25/2023)   Hunger Vital Sign    Worried About Running Out of Food in the Last Year: Sometimes true    Ran Out of Food in the Last Year: Sometimes true  Transportation Needs: Patient Declined (07/25/2023)   PRAPARE - Transportation    Lack of Transportation (Medical): Patient declined    Lack of  Transportation (Non-Medical): Patient declined  Physical Activity: Sufficiently Active (02/14/2023)   Exercise Vital Sign    Days of Exercise per Week: 7 days    Minutes of Exercise per Session: 30 min  Stress: No Stress Concern Present (02/14/2023)   [employer] of Occupational Health - Occupational Stress Questionnaire    Feeling of Stress : Only a little  Social Connections: Moderately Isolated (02/14/2023)   Social Connection and Isolation Panel [NHANES]    Frequency of Communication with Friends and Family: More than three times a week    Frequency of Social Gatherings with Friends and Family: Three times a week    Attends Religious Services: Never    Active Member of Clubs or Organizations: No    Attends Engineer, structural: Not on file    Marital Status: Married   Additional Social History:   Sleep: Good  Appetite:  Good  Current Medications: Current Facility-Administered Medications  Medication Dose Route Frequency Provider Last Rate Last Admin   acetaminophen  (TYLENOL ) tablet 650 mg  650 mg Oral Q6H PRN Ajibola, Ene A, NP   650 mg at 07/25/23 1623  alum & mag hydroxide-simeth (MAALOX/MYLANTA) 200-200-20 MG/5ML suspension 30 mL  30 mL Oral Q4H PRN Ajibola, Ene A, NP       amitriptyline  (ELAVIL ) tablet 25 mg  25 mg Oral QHS Ntuen, Tina C, FNP   25 mg at 07/28/23 2120   haloperidol  (HALDOL ) tablet 5 mg  5 mg Oral TID PRN Ajibola, Ene A, NP       And   diphenhydrAMINE  (BENADRYL ) capsule 50 mg  50 mg Oral TID PRN Ajibola, Ene A, NP       haloperidol  lactate (HALDOL ) injection 5 mg  5 mg Intramuscular TID PRN Ajibola, Ene A, NP       And   diphenhydrAMINE  (BENADRYL ) injection 50 mg  50 mg Intramuscular TID PRN Ajibola, Ene A, NP       And   LORazepam  (ATIVAN ) injection 2 mg  2 mg Intramuscular TID PRN Ajibola, Ene A, NP       haloperidol  lactate (HALDOL ) injection 10 mg  10 mg Intramuscular TID PRN Ajibola, Ene A, NP       And   diphenhydrAMINE  (BENADRYL )  injection 50 mg  50 mg Intramuscular TID PRN Ajibola, Ene A, NP       And   LORazepam  (ATIVAN ) injection 2 mg  2 mg Intramuscular TID PRN Ajibola, Ene A, NP       gabapentin  (NEURONTIN ) capsule 300 mg  300 mg Oral QHS Ntuen, Tina C, FNP   300 mg at 07/28/23 2119   hydrOXYzine  (ATARAX ) tablet 25 mg  25 mg Oral TID PRN Ajibola, Ene A, NP   25 mg at 07/28/23 1204   lamoTRIgine  (LAMICTAL ) tablet 100 mg  100 mg Oral BID Ntuen, Tina C, FNP   100 mg at 07/29/23 0742   magnesium  hydroxide (MILK OF MAGNESIA) suspension 30 mL  30 mL Oral Daily PRN Ajibola, Ene A, NP       propranolol  ER (INDERAL  LA) 24 hr capsule 80 mg  80 mg Oral Daily Ntuen, Tina C, FNP   80 mg at 07/29/23 1610   SUMAtriptan  (IMITREX ) tablet 25 mg  25 mg Oral Q2H PRN Ntuen, Tina C, FNP       traZODone  (DESYREL ) tablet 200 mg  200 mg Oral QHS PRN Ntuen, Tina C, FNP   200 mg at 07/28/23 2120    Lab Results:  No results found for this or any previous visit (from the past 48 hours).  Blood Alcohol level:  Lab Results  Component Value Date   [facility] <15 07/24/2023   ETH <10 08/20/2020   Metabolic Disorder Labs: Lab Results  Component Value Date   HGBA1C 4.7 (L) 07/25/2023   MPG 88.19 07/25/2023   No results found for: "PROLACTIN" Lab Results  Component Value Date   CHOL 230 (H) 02/17/2023   TRIG 250.0 (H) 02/17/2023   HDL 42.30 02/17/2023   CHOLHDL 5 02/17/2023   VLDL 50.0 (H) 02/17/2023   LDLCALC 138 (H) 02/17/2023   LDLCALC 130 (H) 06/12/2021   Physical Findings: AIMS:  , ,  ,  ,    CIWA:    COWS:     Musculoskeletal: Strength & Muscle Tone: within normal limits Gait & Station: normal Patient leans: N/A  Psychiatric Specialty Exam:  Presentation  General Appearance:  Disheveled  Eye Contact: Good  Speech: Clear and Coherent  Speech Volume: Normal  Handedness: Right  Mood and Affect  Mood: Euthymic  Affect: Congruent  Thought Process  Thought Processes: Coherent  Descriptions  of  Associations:Intact  Orientation:Full (Time, Place and Person)  Thought Content:Logical  History of Schizophrenia/Schizoaffective disorder:No  Duration of Psychotic Symptoms:No data recorded Hallucinations:Hallucinations: None  Ideas of Reference:None  Suicidal Thoughts:Suicidal Thoughts: No  Homicidal Thoughts:Homicidal Thoughts: No  Sensorium  Memory: Immediate Good; Recent Good  Judgment: Fair  Insight: Fair  Art therapist  Concentration: Fair  Attention Span: Fair  Recall: [employer] of Knowledge: Fair  Language: Fair  Psychomotor Activity  Psychomotor Activity: Psychomotor Activity: Normal  Assets  Assets: Communication Skills; Desire for Improvement; Physical Health; Resilience  Sleep  Sleep: Sleep: Good Number of Hours of Sleep: 7.5  Physical Exam: Physical Exam Vitals and nursing note reviewed.  Constitutional:      Appearance: He is normal weight.  HENT:     Head: Normocephalic.     Right Ear: External ear normal.     Left Ear: External ear normal.     Nose: Nose normal.     Mouth/Throat:     Mouth: Mucous membranes are moist.     Pharynx: Oropharynx is clear.  Eyes:     Extraocular Movements: Extraocular movements intact.  Cardiovascular:     Rate and Rhythm: Normal rate.     Pulses: Normal pulses.  Pulmonary:     Effort: Pulmonary effort is normal.  Abdominal:     Comments: Deferred  Genitourinary:    Comments: Deferred Musculoskeletal:        General: Normal range of motion.     Cervical back: Normal range of motion.  Skin:    General: Skin is warm.  Neurological:     General: No focal deficit present.     Mental Status: He is alert and oriented to person, place, and time.  Psychiatric:        Mood and Affect: Mood normal.        Behavior: Behavior normal.    Review of Systems  Constitutional:  Negative for chills and fever.  HENT:  Negative for sore throat.   Eyes:  Negative for blurred vision.   Respiratory:  Negative for cough, sputum production, shortness of breath and wheezing.   Cardiovascular:  Negative for chest pain and palpitations.  Gastrointestinal:  Negative for heartburn and nausea.  Genitourinary:  Negative for dysuria, frequency and urgency.  Musculoskeletal:  Negative for falls.  Skin:  Negative for itching and rash.  Neurological:  Negative for dizziness, tingling and headaches.  Endo/Heme/Allergies:        See allergy listing  Psychiatric/Behavioral:  Positive for depression. Negative for hallucinations, substance abuse and suicidal ideas. The patient is nervous/anxious. The patient does not have insomnia.    Blood pressure 117/71, pulse 65, temperature 97.7 F (36.5 C), temperature source Oral, resp. rate 20, height 5\' 8"  (1.727 m), weight 67.1 kg, SpO2 98%. Body mass index is 22.5 kg/m.   Treatment Plan Summary: Daily contact with patient to assess and evaluate symptoms and progress in treatment and Medication management   Physician Treatment Plan for Primary Diagnosis: Bipolar disorder (HCC) Long Term Goal(s): Improvement in symptoms so as ready for discharge   Short Term Goals: Ability to identify changes in lifestyle to reduce recurrence of condition will improve, Ability to verbalize feelings will improve, Ability to disclose and discuss suicidal ideas, Ability to demonstrate self-control will improve, Ability to identify and develop effective coping behaviors will improve, Ability to maintain clinical measurements within normal limits will improve, Compliance with prescribed medications will improve, and Ability to identify triggers associated with  substance abuse/mental health issues will improve   Physician Treatment Plan for Secondary Diagnosis: Assessment: CAANAN VOIT is a 38 y.o. Caucasian male with prior psychiatric history significant for bipolar 1 disorder depressed and anxiety who [facility] ED for worsening depression resulting in suicidal ideation in the context of being in the process of divorce by the wife of 17 years.    Principal Problem:   Bipolar disorder (HCC)   Plans:   No new changes today. Will continue to monitor.   Medications: Continue home medications -- Amitriptyline  25 mg tablets p.o. at bedtime for depression -- Gabapentin  300 mg capsule p.o. at bedtime for neuropathy -- Lamictal  100 mg tablets 1 p.o. 2 times daily for bipolar -- Trazodone  100 mg tablets at bedtime for sleep as needed   Medications for other medical problems: -- Propranolol  ER 80 mg 24-hour capsule p.o. daily for blood pressure -- Imitrex  25 mg tablets may repeat in 2 hours for a maximum of 100 mg every 24 hours for Tension headache   Other PRN Medications  -Acetaminophen  650 mg every 6 as needed/mild pain  -Maalox 30 mL oral every 4 as needed/digestion  -Magnesium  hydroxide 30 mL daily as needed/mild constipation    Admission lab reviewed: CMP: Potassium 3.2 low, glucose 132 high, BUN 5 low, alkaline phosphatase 37 low, total protein 6.1 low, otherwise normal.  Lipid profile: Cholesterol 230 high, LDL 138 high, triglyceride 250 high, VLDL 50.  Otherwise normal.  CBC with differential: Within normal limits.  UDS: Positive for cocaine metabolites, positive for benzodiazepines, positive for tricyclic urine screen.  TSH: 1.29, within normal limits   New labs ordered: Hemoglobin A1c, vitamin B12, 25 hydroxyvitamin D,   EKG reviewed: Normal sinus rhythm, ventricular rate 63, QT/QTc 394/403   Continue BH Agitation Protocol  --Haldol  5 mg, oral, 3 times daily as needed, mild agitation  --Benadryl  50 mg, oral, 3 times daily as needed, mild agitation                                    OR   --Haldol  injection 5 mg, IM, 3 times daily as needed, moderate agitation  --Benadryl  injection 50 mg, IM, 3 times daily as needed, moderate agitation  --Ativan  injection 2 mg, IM, 3 times  daily as needed, moderate agitation                                      OR  --Haldol  injection 10 mg, IM, 3 times daily as needed, severe agitation  --Benadryl  injection 50 mg, IM, 3 times daily as needed, severe agitation  --Ativan  injection 2 mg, IM, 3 times daily as needed, severe agitation    --  The risks/benefits/side-effects/alternatives to this medication were discussed in detail with the patient and time was given for questions. The patient consents to medication trial.   -- Metabolic profile and EKG monitoring obtained while on an atypical antipsychotic (BMI: Lipid Panel: HbgA1c: QTc:)   -- Encouraged patient to participate in unit milieu and in scheduled group therapies     Safety and Monitoring:  Voluntary admission to inpatient psychiatric unit for safety, stabilization and treatment  Daily contact with patient to assess and evaluate symptoms and progress in treatment  Patient's case to be discussed in multi-disciplinary  team meeting  Observation Level : q15 minute checks  Vital signs: q12 hours  Precautions: suicide, but pt currently verbally contracts for safety on unit?    Jacarri Gesner, MD 07/29/2023, 8:34 AM Patient ID: Retia Castellani, male   DOB: [birth year], 38 y.o.   MRN: [id number]

## 2023-07-29 NOTE — BH IP Treatment Plan (Addendum)
 Interdisciplinary Treatment and Diagnostic Plan Update  07/29/2023 Time of Session: 11:40 AM - UPDATE Joel Carr MRN: [id number]  Principal Diagnosis: Bipolar disorder (HCC)  Secondary Diagnoses: Principal Problem:   Bipolar disorder (HCC) Active Problems:   Bipolar and related disorder due to another medical condition with mixed features   Current Medications:  Current Facility-Administered Medications  Medication Dose Route Frequency Provider Last Rate Last Admin   acetaminophen  (TYLENOL ) tablet 650 mg  650 mg Oral Q6H PRN Ajibola, Ene A, NP   650 mg at 07/25/23 1623   alum & mag hydroxide-simeth (MAALOX/MYLANTA) 200-200-20 MG/5ML suspension 30 mL  30 mL Oral Q4H PRN Ajibola, Ene A, NP       amitriptyline  (ELAVIL ) tablet 25 mg  25 mg Oral QHS Ntuen, Tina C, FNP   25 mg at 07/28/23 2120   haloperidol  (HALDOL ) tablet 5 mg  5 mg Oral TID PRN Ajibola, Ene A, NP       And   diphenhydrAMINE  (BENADRYL ) capsule 50 mg  50 mg Oral TID PRN Ajibola, Ene A, NP       haloperidol  lactate (HALDOL ) injection 5 mg  5 mg Intramuscular TID PRN Ajibola, Ene A, NP       And   diphenhydrAMINE  (BENADRYL ) injection 50 mg  50 mg Intramuscular TID PRN Ajibola, Ene A, NP       And   LORazepam  (ATIVAN ) injection 2 mg  2 mg Intramuscular TID PRN Ajibola, Ene A, NP       haloperidol  lactate (HALDOL ) injection 10 mg  10 mg Intramuscular TID PRN Ajibola, Ene A, NP       And   diphenhydrAMINE  (BENADRYL ) injection 50 mg  50 mg Intramuscular TID PRN Ajibola, Ene A, NP       And   LORazepam  (ATIVAN ) injection 2 mg  2 mg Intramuscular TID PRN Ajibola, Ene A, NP       hydrOXYzine  (ATARAX ) tablet 25 mg  25 mg Oral TID PRN Ajibola, Ene A, NP   25 mg at 07/28/23 1204   lamoTRIgine  (LAMICTAL ) tablet 100 mg  100 mg Oral BID Ntuen, Tina C, FNP   100 mg at 07/29/23 0742   magnesium  hydroxide (MILK OF MAGNESIA) suspension 30 mL  30 mL Oral Daily PRN Ajibola, Ene A, NP       propranolol  ER (INDERAL  LA) 24 hr capsule 80 mg   80 mg Oral Daily Ntuen, Tina C, FNP   80 mg at 07/29/23 7425   SUMAtriptan  (IMITREX ) tablet 25 mg  25 mg Oral Q2H PRN Ntuen, Tina C, FNP       traZODone  (DESYREL ) tablet 200 mg  200 mg Oral QHS PRN Ntuen, Tina C, FNP   200 mg at 07/28/23 2120   PTA Medications: Medications Prior to Admission  Medication Sig Dispense Refill Last Dose/Taking   amitriptyline  (ELAVIL ) 25 MG tablet Take 25 mg by mouth at bedtime.   07/25/2023   gabapentin  (NEURONTIN ) 300 MG capsule Take 1 capsule (300 mg total) by mouth at bedtime. 30 capsule 3 07/25/2023   lamoTRIgine  (LAMICTAL ) 100 MG tablet Take 1 tablet (100 mg total) by mouth 2 (two) times daily. 180 tablet 0 07/25/2023   propranolol  ER (INDERAL  LA) 80 MG 24 hr capsule Take 1 capsule (80 mg total) by mouth daily. 90 capsule 3 07/25/2023   SUMAtriptan  (IMITREX ) 25 MG tablet Take 1 tablet by mouth at onset of headache. May repeat in 2 hours if headache persists or recurs. Max 100  mg/24 hours. (Patient taking differently: Take 25 mg by mouth every 2 (two) hours as needed for migraine or headache. Take 1 tablet by mouth at onset of headache. May repeat in 2 hours if headache persists or recurs. Max 100 mg/24 hours.) 10 tablet 5 07/25/2023   traZODone  (DESYREL ) 100 MG tablet Take 200 mg by mouth at bedtime as needed for sleep.   07/25/2023    Patient Stressors:    Patient Strengths:    Treatment Modalities: Medication Management, Group therapy, Case management,  1 to 1 session with clinician, Psychoeducation, Recreational therapy.   Physician Treatment Plan for Primary Diagnosis: Bipolar disorder (HCC) Long Term Goal(s): Improvement in symptoms so as ready for discharge   Short Term Goals: Ability to identify changes in lifestyle to reduce recurrence of condition will improve Ability to verbalize feelings will improve Ability to disclose and discuss suicidal ideas Ability to demonstrate self-control will improve Ability to identify and develop effective coping  behaviors will improve Ability to maintain clinical measurements within normal limits will improve Compliance with prescribed medications will improve Ability to identify triggers associated with substance abuse/mental health issues will improve  Medication Management: Evaluate patient's response, side effects, and tolerance of medication regimen.  Therapeutic Interventions: 1 to 1 sessions, Unit Group sessions and Medication administration.  Evaluation of Outcomes: Progressing  Physician Treatment Plan for Secondary Diagnosis: Principal Problem:   Bipolar disorder (HCC) Active Problems:   Bipolar and related disorder due to another medical condition with mixed features  Long Term Goal(s): Improvement in symptoms so as ready for discharge   Short Term Goals: Ability to identify changes in lifestyle to reduce recurrence of condition will improve Ability to verbalize feelings will improve Ability to disclose and discuss suicidal ideas Ability to demonstrate self-control will improve Ability to identify and develop effective coping behaviors will improve Ability to maintain clinical measurements within normal limits will improve Compliance with prescribed medications will improve Ability to identify triggers associated with substance abuse/mental health issues will improve     Medication Management: Evaluate patient's response, side effects, and tolerance of medication regimen.  Therapeutic Interventions: 1 to 1 sessions, Unit Group sessions and Medication administration.  Evaluation of Outcomes: Progressing   RN Treatment Plan for Primary Diagnosis: Bipolar disorder (HCC) Long Term Goal(s): Knowledge of disease and therapeutic regimen to maintain health will improve  Short Term Goals: Ability to remain free from injury will improve, Ability to participate in decision making will improve, Ability to verbalize feelings will improve, and Compliance with prescribed medications will  improve  Medication Management: RN will administer medications as ordered by provider, will assess and evaluate patient's response and provide education to patient for prescribed medication. RN will report any adverse and/or side effects to prescribing provider.  Therapeutic Interventions: 1 on 1 counseling sessions, Psychoeducation, Medication administration, Evaluate responses to treatment, Monitor vital signs and CBGs as ordered, Perform/monitor CIWA, COWS, AIMS and Fall Risk screenings as ordered, Perform wound care treatments as ordered.  Evaluation of Outcomes: Progressing   LCSW Treatment Plan for Primary Diagnosis: Bipolar disorder (HCC) Long Term Goal(s): Safe transition to appropriate next level of care at discharge, Engage patient in therapeutic group addressing interpersonal concerns.  Short Term Goals: Engage patient in aftercare planning with referrals and resources, Increase ability to appropriately verbalize feelings, Facilitate acceptance of mental health diagnosis and concerns, and Identify triggers associated with mental health/substance abuse issues  Therapeutic Interventions: Assess for all discharge needs, 1 to 1 time with Child psychotherapist,  Explore available resources and support systems, Assess for adequacy in community support network, Educate family and significant other(s) on suicide prevention, Complete Psychosocial Assessment, Interpersonal group therapy.  Evaluation of Outcomes: Progressing   Progress in Treatment: Attending groups: Yes Participating in group:  Yes. Taking medication as prescribed: Yes. Toleration medication: Yes. Family/Significant other contact made:  Yes, contacted  Beacher Giglia (wife) [phone] Patient understands diagnosis: Yes. Discussing patient identified problems/goals with staff: Yes. Medical problems stabilized or resolved: Yes. Denies suicidal/homicidal ideation: Yes. Issues/concerns per patient self-inventory: No.   New  problem(s) identified:  No   New Short Term/Long Term Goal(s):     medication stabilization, elimination of SI thoughts, development of comprehensive mental wellness plan.      Patient Goals:  "I'm dealing with grief because my wife and I are getting separated."   Discharge Plan or Barriers:  Patient recently admitted. CSW will continue to follow and assess for appropriate referrals and possible discharge planning.     Reason for Continuation of Hospitalization: Anxiety Medication stabilization Suicidal ideation   Estimated Length of Stay:  4 - 6 days  Last 3 [place] Suicide Severity Risk Score: Flowsheet Row [facility] from 07/24/2023 [facility] Admission (Discharged) from 01/13/2023 in [facility]  C-SSRS RISK CATEGORY High Risk High Risk No Risk       Last PHQ 2/9 Scores:    07/05/2023    3:09 PM 05/23/2023    8:42 AM 05/11/2023    1:40 PM  Depression screen PHQ 2/9  Decreased Interest 0 0 0  Down, Depressed, Hopeless 0 0 0  PHQ - 2 Score 0 0 0    Scribe for Treatment Team: Joel Carr, LCSWA 07/29/2023 4:48 PM

## 2023-07-29 NOTE — [facility] Group Notes (Signed)
 [facility] Group Notes:  (Nursing/MHT/Case Management/Adjunct)  Date:  07/29/2023  Time:  8:49 PM  Type of Therapy:   AA group  Participation Level:  Active  Participation Quality:  Appropriate  Affect:  Appropriate  Cognitive:  Appropriate  Insight:  Appropriate  Engagement in Group:  Engaged  Modes of Intervention:  Education  Summary of Progress/Problems:Attended AA meeting.  [name] 07/29/2023, 8:49 PM

## 2023-07-29 NOTE — Progress Notes (Signed)
 Patient spoke to Child psychotherapist regarding [facility] update. Patient states [facility] would need him to stop one of his medications before they're willing to accept him. Informed patient to make his provider and nurse aware of this so they can discuss with him. Patient states he will do that. Social worker will follow-up with patient later today regarding this matter.   Nacole Fluhr, LCSWA 10:18AM

## 2023-07-29 NOTE — Plan of Care (Signed)
  Problem: Education: Goal: Mental status will improve Outcome: Progressing   Problem: Activity: Goal: Interest or engagement in activities will improve Outcome: Progressing Goal: Sleeping patterns will improve Outcome: Progressing   Problem: Physical Regulation: Goal: Ability to maintain clinical measurements within normal limits will improve Outcome: Progressing   Problem: Safety: Goal: Periods of time without injury will increase Outcome: Progressing

## 2023-07-29 NOTE — Progress Notes (Signed)
 Patient rated his anxiety level 2/10 and his depression level 1/10 with 10 being the highest and 0 none. Pt identified his goal for today as," Staying positive and not let things I can't control". Medication and group compliant. Pt observed interacting well with peers.  Appetite good on shift. Safety maintained.  07/29/23 0915  Psych Admission Type (Psych Patients Only)  Admission Status Voluntary  Psychosocial Assessment  Patient Complaints Anxiety;Depression  Eye Contact Fair  Facial Expression Animated  Affect Appropriate to circumstance  Speech Logical/coherent  Interaction Assertive  Motor Activity Other (Comment) (WNL)  Appearance/Hygiene Unremarkable  Behavior Characteristics Appropriate to situation  Mood Anxious;Pleasant  Thought Process  Coherency WDL  Content WDL  Delusions None reported or observed  Perception WDL  Hallucination None reported or observed  Judgment WDL  Confusion None  Danger to Self  Current suicidal ideation? Denies  Self-Injurious Behavior No self-injurious ideation or behavior indicators observed or expressed   Agreement Not to Harm Self Yes  Description of Agreement Verbal  Danger to Others  Danger to Others None reported or observed

## 2023-07-29 NOTE — Progress Notes (Signed)
   07/28/23 2141  Psych Admission Type (Psych Patients Only)  Admission Status Voluntary  Psychosocial Assessment  Patient Complaints None  Eye Contact Fair  Facial Expression Animated  Affect Appropriate to circumstance  Speech Logical/coherent  Interaction Assertive  Motor Activity Other (Comment) (WDL)  Appearance/Hygiene Unremarkable  Behavior Characteristics Cooperative;Calm  Mood Pleasant  Thought Process  Coherency WDL  Content WDL  Delusions None reported or observed  Perception WDL  Hallucination None reported or observed  Judgment WDL  Confusion None  Danger to Self  Current suicidal ideation? Denies  Description of Agreement VERBAL  Danger to Others  Danger to Others None reported or observed

## 2023-07-29 NOTE — [facility] Group Notes (Signed)
 Adult Psychoeducational Group Note  Date:  07/29/2023 Time:  9:26 AM  Group Topic/Focus:  Goals Group:   The focus of this group is to help patients establish daily goals to achieve during treatment and discuss how the patient can incorporate goal setting into their daily lives to aide in recovery.  Participation Level:  Active  Participation Quality:  Appropriate  Affect:  Appropriate  Cognitive:  Alert  Insight: Appropriate  Engagement in Group:  Engaged  Modes of Intervention:  Discussion  Additional Comments:  Patient attended the goals group.  Becki Bouton 07/29/2023, 9:26 AM

## 2023-07-30 ENCOUNTER — Encounter (HOSPITAL_COMMUNITY): Payer: Self-pay | Admitting: Psychiatry

## 2023-07-30 NOTE — Group Note (Signed)
 Date:  07/30/2023 Time:  10:20 PM  Group Topic/Focus:  Wrap-Up Group:   The focus of this group is to help patients review their daily goal of treatment and discuss progress on daily workbooks.    Participation Level:  Active  Participation Quality:  Appropriate  Affect:  Appropriate  Cognitive:  Appropriate  Insight: Appropriate and Good  Engagement in Group:  Engaged  Modes of Intervention:  Discussion  Additional Comments:  Patient stated that he had a good day he rated it a 8 out of 10   Moishe Angel 07/30/2023, 10:20 PM

## 2023-07-30 NOTE — Progress Notes (Signed)
 Patient denies SI/HI/AVH. He reports 1/10 for anxiety and 1/10 for depression .  07/30/23 0800  Psych Admission Type (Psych Patients Only)  Admission Status Voluntary  Psychosocial Assessment  Patient Complaints Anxiety;Depression (Patient rates anxiety 1/10 and depression 1/10)  Eye Contact Fair  Facial Expression Other (Comment) (WNL)  Affect Appropriate to circumstance  Speech Logical/coherent  Interaction Assertive  Motor Activity Other (Comment) (WNL)  Appearance/Hygiene Unremarkable  Behavior Characteristics Appropriate to situation  Mood Pleasant  Thought Process  Coherency WDL  Content WDL  Delusions None reported or observed  Perception WDL  Hallucination None reported or observed  Judgment Limited  Confusion None  Danger to Self  Current suicidal ideation? Denies  Description of Suicide Plan no plan  Self-Injurious Behavior No self-injurious ideation or behavior indicators observed or expressed   Agreement Not to Harm Self Yes  Description of Agreement verbal  Danger to Others  Danger to Others None reported or observed

## 2023-07-30 NOTE — Progress Notes (Signed)
   07/30/23 2216  Psych Admission Type (Psych Patients Only)  Admission Status Voluntary  Psychosocial Assessment  Patient Complaints Anxiety  Eye Contact Fair  Facial Expression Animated  Affect Appropriate to circumstance  Speech Logical/coherent  Interaction Assertive  Motor Activity Other (Comment) (WDL)  Appearance/Hygiene Unremarkable  Behavior Characteristics Appropriate to situation  Mood Pleasant  Thought Process  Coherency WDL  Content WDL  Delusions None reported or observed  Perception WDL  Hallucination None reported or observed  Judgment Limited  Confusion None  Danger to Self  Current suicidal ideation? Denies  Self-Injurious Behavior No self-injurious ideation or behavior indicators observed or expressed   Agreement Not to Harm Self Yes  Description of Agreement verbal  Danger to Others  Danger to Others None reported or observed

## 2023-07-30 NOTE — Group Note (Signed)
 Date:  07/30/2023 Time:  9:08 AM  Group Topic/Focus:  Goals Group:   The focus of this group is to help patients establish daily goals to achieve during treatment and discuss how the patient can incorporate goal setting into their daily lives to aide in recovery. Orientation:   The focus of this group is to educate the patient on the purpose and policies of crisis stabilization and provide a format to answer questions about their admission.  The group details unit policies and expectations of patients while admitted.    Participation Level:  Active  Participation Quality:  Appropriate  Affect:  Appropriate  Cognitive:  Appropriate  Insight: Appropriate  Engagement in Group:  Engaged  Modes of Intervention:  Orientation  Additional Comments:    Violette Grief 07/30/2023, 9:08 AM

## 2023-07-30 NOTE — Progress Notes (Signed)
   07/29/23 2032  Psych Admission Type (Psych Patients Only)  Admission Status Voluntary  Psychosocial Assessment  Patient Complaints Anxiety;Depression  Eye Contact Fair  Facial Expression Animated  Affect Appropriate to circumstance  Speech Logical/coherent  Interaction Assertive  Motor Activity Other (Comment) (WDL)  Appearance/Hygiene Unremarkable  Behavior Characteristics Appropriate to situation  Mood Anxious;Pleasant  Thought Process  Coherency WDL  Content WDL  Delusions None reported or observed  Perception WDL  Hallucination None reported or observed  Judgment WDL  Confusion None  Danger to Self  Current suicidal ideation? Denies  Agreement Not to Harm Self Yes  Description of Agreement verbal  Danger to Others  Danger to Others None reported or observed

## 2023-07-30 NOTE — Progress Notes (Signed)
 [facility] MD Progress Note  07/30/2023 7:32 AM Joel Carr  MRN:  [id number]  Principal Problem: Bipolar disorder [facility]) Diagnosis: Principal Problem:   Bipolar disorder (HCC) Active Problems:   Bipolar and related disorder due to another medical condition with mixed features  Reason for admission: Joel Carr is a 38 y.o. Caucasian male with prior psychiatric history significant for bipolar 1 disorder depressed and anxiety who presents voluntarily to [facility] from [facility] ED for worsening depression resulting in suicidal ideation in the context of being in the process of divorce by the wife of 17 years.  Chart Review from last 24 hours:  The patient's chart was reviewed and nursing notes were reviewed. The patient's case was discussed in multidisciplinary team meeting. Per [facility] patient is compliant with His psychotropic medications.  No signs reviewed without critical values.  Required Atarax  1X, trazodone  1X overnight.  Today's assessment notes: Patient evaluated on the unit. Reports sleep has been. Reports appetite has been improving. States mood is "mood" today. Today patient reports less depression today. Today patient reports less anxiety today.   Wife visited last night and this improved his mood, he feels that they are on good terms. He feels he has tolerated the discontinuation of the gabapentin . Finds the amitriptyline  more helpfil. Reports goals for today include "focusing on the things I can control".   On interview, suicidal ideations are not present . Homicidal ideations are not present.   There are no auditory hallucinations, visual hallucinations, paranoid ideations, or delusional thought processes.   Side effects to currently prescribed medications are none. There are no somatic complaints. Reports regular bowel movements.   Total Time spent with patient: 45 minutes  Past Psychiatric History: Information collected from patient, ED treatment Prev  Dx/Sx: bipolar disorder Current Psych Provider: Yes, see above Home Meds (current): Lamictal .  See above Previous Med Trials: denies Therapy: yes see above Prior Psych Hospitalization: yes. One prior psych hospitalization due to SI Prior Self Harm: denies Prior Violence: denies  Past Medical History:  Past Medical History:  Diagnosis Date   Anxiety    Bipolar disorder (HCC)    Chronic pain syndrome    COVID-19 virus infection 12/23/2019   mild congestion and body aches   Depression    History of kidney stones    History of migraine    Hyperlipidemia    Pneumonia    RLS (restless legs syndrome)     Past Surgical History:  Procedure Laterality Date   ARTHRODESIS METATARSAL Left 09/19/2020   Procedure: ARTHRODESIS METATARSAL; LISFRANC MULTIPLE-1/2/3;  Surgeon: Pink Bridges, DPM;  Location: [facility];  Service: Podiatry;  Laterality: Left;   ARTHRODESIS METATARSAL Left 01/13/2023   Procedure: ARTHRODESIS; LISFRANC; MULTIPLE FOURTH AND FIFTH;  Surgeon: Pink Bridges, DPM;  Location: [facility];  Service: Orthopedics/Podiatry;  Laterality: Left;   FOOT ARTHRODESIS Left 09/19/2020   Procedure: ARTHRODESIS FOOT- *POSSIBLE BONE GRAFT;  Surgeon: Pink Bridges, DPM;  Location: [facility];  Service: Podiatry;  Laterality: Left;   FOOT ARTHRODESIS Left 01/13/2023   Procedure: AUTOGRAFT HARVEST;  Surgeon: Pink Bridges, DPM;  Location: [facility];  Service: Orthopedics/Podiatry;  Laterality: Left;   KNEE ARTHROSCOPY WITH MENISCAL REPAIR Right 10/29/2014   Procedure: KNEE ARTHROSCOPY WITH MENISCAL REPAIR;  Surgeon: Molli Angelucci, MD;  Location: [facility];  Service: Orthopedics;  Laterality: Right;   OPEN REDUCTION INTERNAL FIXATION (ORIF) FOOT LISFRANC FRACTURE Left 09/19/2020   Procedure: OPEN REDUCTION INTERNAL FIXATION (ORIF) FOOT LISFRANC  FRACTURE x2 4/5  PERCUTANEOUS SKELETAL FIXATION OF TARSOMETATARSAL JOINT DISLOCATION WITH MANIPULATION, X2 4/5;  Surgeon: Pink Bridges, DPM;   Location: [facility];  Service: Podiatry;  Laterality: Left;   TESTICLE SURGERY N/A    as a child   TYMPANOPLASTY Bilateral 1990   Family History:  Family History  Problem Relation Age of Onset   Depression Father    Alcohol abuse Father    Diabetes Paternal Grandfather    Heart attack Paternal Grandfather    Colon cancer Neg Hx    Family Psychiatric  History: See H&P Social History:  Social History   Substance and Sexual Activity  Alcohol Use Not Currently   Comment: rarely     Social History   Substance and Sexual Activity  Drug Use No    Social History   Socioeconomic History   Marital status: Married    Spouse name: Joel Carr   Number of children: 2   Years of education: 10-   Highest education level: GED or equivalent  Occupational History   Not on file  Tobacco Use   Smoking status: Every Day    Types: Cigars, E-cigarettes   Smokeless tobacco: Never   Tobacco comments:    3 cig daily-04/29/2020  Vaping Use   Vaping status: Every Day   Substances: Nicotine , Flavoring  Substance and Sexual Activity   Alcohol use: Not Currently    Comment: rarely   Drug use: No   Sexual activity: Yes  Other Topics Concern   Not on file  Social History Narrative   Married with 2 kids. 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, prison and probation services   Social Drivers of Health   Financial Resource Strain: Medium Risk (02/14/2023)   Overall Financial Resource Strain (CARDIA)    Difficulty of Paying Living Expenses: Somewhat hard  Food Insecurity: Food Insecurity Present (07/25/2023)   Hunger Vital Sign    Worried About Running Out of Food in the Last Year: Sometimes Joel    Ran Out of Food in the Last Year: Sometimes Joel  Transportation Needs: Patient Declined (07/25/2023)   PRAPARE - Transportation    Lack of Transportation (Medical): Patient declined    Lack of Transportation (Non-Medical): Patient declined  Physical Activity: Sufficiently Active (02/14/2023)    Exercise Vital Sign    Days of Exercise per Week: 7 days    Minutes of Exercise per Session: 30 min  Stress: No Stress Concern Present (02/14/2023)   [employer] of Occupational Health - Occupational Stress Questionnaire    Feeling of Stress : Only a little  Social Connections: Moderately Isolated (02/14/2023)   Social Connection and Isolation Panel [NHANES]    Frequency of Communication with Friends and Family: More than three times a week    Frequency of Social Gatherings with Friends and Family: Three times a week    Attends Religious Services: Never    Active Member of Clubs or Organizations: No    Attends Engineer, structural: Not on file    Marital Status: Married   Additional Social History:   Sleep: Good  Appetite:  Good  Current Medications: Current Facility-Administered Medications  Medication Dose Route Frequency Provider Last Rate Last Admin   acetaminophen  (TYLENOL ) tablet 650 mg  650 mg Oral Q6H PRN Ajibola, Ene A, NP   650 mg at 07/25/23 1623   alum & mag hydroxide-simeth (MAALOX/MYLANTA) 200-200-20 MG/5ML suspension 30 mL  30 mL Oral Q4H PRN Ajibola, Ene A, NP  amitriptyline  (ELAVIL ) tablet 25 mg  25 mg Oral QHS Ntuen, Tina C, FNP   25 mg at 07/29/23 2121   haloperidol  (HALDOL ) tablet 5 mg  5 mg Oral TID PRN Ajibola, Ene A, NP       And   diphenhydrAMINE  (BENADRYL ) capsule 50 mg  50 mg Oral TID PRN Ajibola, Ene A, NP       haloperidol  lactate (HALDOL ) injection 5 mg  5 mg Intramuscular TID PRN Ajibola, Ene A, NP       And   diphenhydrAMINE  (BENADRYL ) injection 50 mg  50 mg Intramuscular TID PRN Ajibola, Ene A, NP       And   LORazepam  (ATIVAN ) injection 2 mg  2 mg Intramuscular TID PRN Ajibola, Ene A, NP       haloperidol  lactate (HALDOL ) injection 10 mg  10 mg Intramuscular TID PRN Ajibola, Ene A, NP       And   diphenhydrAMINE  (BENADRYL ) injection 50 mg  50 mg Intramuscular TID PRN Ajibola, Ene A, NP       And   LORazepam  (ATIVAN ) injection  2 mg  2 mg Intramuscular TID PRN Ajibola, Ene A, NP       hydrOXYzine  (ATARAX ) tablet 25 mg  25 mg Oral TID PRN Ajibola, Ene A, NP   25 mg at 07/29/23 2120   lamoTRIgine  (LAMICTAL ) tablet 100 mg  100 mg Oral BID Ntuen, Tina C, FNP   100 mg at 07/29/23 1656   magnesium  hydroxide (MILK OF MAGNESIA) suspension 30 mL  30 mL Oral Daily PRN Ajibola, Ene A, NP       propranolol  ER (INDERAL  LA) 24 hr capsule 80 mg  80 mg Oral Daily Ntuen, Tina C, FNP   80 mg at 07/29/23 0454   SUMAtriptan  (IMITREX ) tablet 25 mg  25 mg Oral Q2H PRN Ntuen, Tina C, FNP       traZODone  (DESYREL ) tablet 200 mg  200 mg Oral QHS PRN Ntuen, Tina C, FNP   200 mg at 07/29/23 2120    Lab Results:  No results found for this or any previous visit (from the past 48 hours).  Blood Alcohol level:  Lab Results  Component Value Date   [facility] <15 07/24/2023   ETH <10 08/20/2020   Metabolic Disorder Labs: Lab Results  Component Value Date   HGBA1C 4.7 (L) 07/25/2023   MPG 88.19 07/25/2023   No results found for: "PROLACTIN" Lab Results  Component Value Date   CHOL 230 (H) 02/17/2023   TRIG 250.0 (H) 02/17/2023   HDL 42.30 02/17/2023   CHOLHDL 5 02/17/2023   VLDL 50.0 (H) 02/17/2023   LDLCALC 138 (H) 02/17/2023   LDLCALC 130 (H) 06/12/2021   Musculoskeletal: Strength & Muscle Tone: within normal limits Gait & Station: normal Patient leans: N/A  Psychiatric Specialty Exam:  Presentation  General Appearance:  Disheveled  Eye Contact: Good  Speech: Clear and Coherent  Speech Volume: Normal   Mood and Affect  Mood: "Good"  Affect: Congruent  Thought Process  Thought Processes: Coherent  Descriptions of Associations:Intact  Orientation:Full (Time, Place and Person)  Thought Content:Logical  History of Schizophrenia/Schizoaffective disorder:No  Duration of Psychotic Symptoms:No data recorded Hallucinations:Hallucinations: None  Ideas of Reference:None  Suicidal Thoughts:Suicidal Thoughts:  No  Homicidal Thoughts:Homicidal Thoughts: No  Sensorium  Memory: Immediate Good; Recent Good  Judgment: Fair  Insight: Fair  Art therapist  Concentration: Fair  Attention Span: Fair  Recall: [employer] of Knowledge: Fair  Language: Fair  Psychomotor Activity  Psychomotor Activity: No data recorded  Assets  Assets: Communication Skills; Desire for Improvement; Physical Health; Resilience  Sleep  Sleep: Sleep: Good Number of Hours of Sleep: 8.5  Physical Exam: Physical Exam Vitals and nursing note reviewed.  Constitutional:      Appearance: He is normal weight.  HENT:     Head: Normocephalic.     Right Ear: External ear normal.     Left Ear: External ear normal.     Nose: Nose normal.     Mouth/Throat:     Mouth: Mucous membranes are moist.     Pharynx: Oropharynx is clear.  Eyes:     Extraocular Movements: Extraocular movements intact.  Cardiovascular:     Rate and Rhythm: Normal rate.     Pulses: Normal pulses.  Pulmonary:     Effort: Pulmonary effort is normal.  Abdominal:     Comments: Deferred  Genitourinary:    Comments: Deferred Musculoskeletal:        General: Normal range of motion.     Cervical back: Normal range of motion.  Skin:    General: Skin is warm.  Neurological:     General: No focal deficit present.     Mental Status: He is alert and oriented to person, place, and time.  Psychiatric:        Mood and Affect: Mood normal.        Behavior: Behavior normal.    Review of Systems  Constitutional:  Negative for chills and fever.  HENT:  Negative for sore throat.   Eyes:  Negative for blurred vision.  Respiratory:  Negative for cough, sputum production, shortness of breath and wheezing.   Cardiovascular:  Negative for chest pain and palpitations.  Gastrointestinal:  Negative for heartburn and nausea.  Genitourinary:  Negative for dysuria, frequency and urgency.  Musculoskeletal:  Negative for falls.  Skin:   Negative for itching and rash.  Neurological:  Negative for dizziness, tingling and headaches.  Endo/Heme/Allergies:        See allergy listing  Psychiatric/Behavioral:  Positive for depression. Negative for hallucinations, substance abuse and suicidal ideas. The patient is nervous/anxious. The patient does not have insomnia.    Blood pressure 112/72, pulse 71, temperature 97.8 F (36.6 C), temperature source Oral, resp. rate 14, height 5\' 8"  (1.727 m), weight 67.1 kg, SpO2 99%. Body mass index is 22.5 kg/m.   Treatment Plan Summary: Daily contact with patient to assess and evaluate symptoms and progress in treatment and Medication management   Physician Treatment Plan for Primary Diagnosis: Bipolar disorder (HCC) Long Term Goal(s): Improvement in symptoms so as ready for discharge   Short Term Goals: Ability to identify changes in lifestyle to reduce recurrence of condition will improve, Ability to verbalize feelings will improve, Ability to disclose and discuss suicidal ideas, Ability to demonstrate self-control will improve, Ability to identify and develop effective coping behaviors will improve, Ability to maintain clinical measurements within normal limits will improve, Compliance with prescribed medications will improve, and Ability to identify triggers associated with substance abuse/mental health issues will improve   Physician Treatment Plan for Secondary Diagnosis: Assessment:  CASS RILL is a 38 y.o. Caucasian male with prior psychiatric history significant for bipolar 1 disorder depressed and anxiety who presents voluntarily to [facility] from [facility] ED for worsening depression resulting in suicidal ideation in the context of being in the process of divorce by the wife of 17 years.  Principal Problem:   Bipolar disorder (HCC)   Plans:   Safety and Monitoring:  Voluntary admission to inpatient psychiatric unit for safety, stabilization and  treatment  Daily contact with patient to assess and evaluate symptoms and progress in treatment  Patient's case to be discussed in multi-disciplinary team meeting  Observation Level : q15 minute checks  Vital signs: q12 hours  Precautions: suicide, but pt currently verbally contracts for safety on unit?    Medications: Continue home medications -- Continue amitriptyline  25 mg tablets p.o. at bedtime for depression -- Continue gabapentin  300 mg capsule p.o. at bedtime for neuropathy -- Continue Lamictal  100 mg tablets 1 p.o. 2 times daily for bipolar -- Continue trazodone  100 mg tablets at bedtime for sleep as needed   Medications for other medical problems: -- Propranolol  ER 80 mg 24-hour capsule p.o. daily for blood pressure -- Imitrex  25 mg tablets may repeat in 2 hours for a maximum of 100 mg every 24 hours for Tension headache   Other PRN Medications  -Acetaminophen  650 mg every 6 as needed/mild pain  -Maalox 30 mL oral every 4 as needed/digestion  -Magnesium  hydroxide 30 mL daily as needed/mild constipation    Admission lab reviewed: CMP: Potassium 3.2 low, glucose 132 high, BUN 5 low, alkaline phosphatase 37 low, total protein 6.1 low, otherwise normal.  Lipid profile: Cholesterol 230 high, LDL 138 high, triglyceride 250 high, VLDL 50.  Otherwise normal.  CBC with differential: Within normal limits.  UDS: Positive for cocaine metabolites, positive for benzodiazepines, positive for tricyclic urine screen.  TSH: 1.29, within normal limits   New labs ordered: Hemoglobin A1c, vitamin B12, 25 hydroxyvitamin D,   EKG reviewed: Normal sinus rhythm, ventricular rate 63, QT/QTc 394/403   Continue BH Agitation Protocol  --Haldol  5 mg, oral, 3 times daily as needed, mild agitation  --Benadryl  50 mg, oral, 3 times daily as needed, mild agitation                                    OR   --Haldol  injection 5 mg, IM, 3 times daily as needed, moderate agitation  --Benadryl  injection 50 mg, IM,  3 times daily as needed, moderate agitation  --Ativan  injection 2 mg, IM, 3 times daily as needed, moderate agitation                                      OR  --Haldol  injection 10 mg, IM, 3 times daily as needed, severe agitation  --Benadryl  injection 50 mg, IM, 3 times daily as needed, severe agitation  --Ativan  injection 2 mg, IM, 3 times daily as needed, severe agitation    --  The risks/benefits/side-effects/alternatives to this medication were discussed in detail with the patient and time was given for questions. The patient consents to medication trial.   -- Metabolic profile and EKG monitoring obtained while on an atypical antipsychotic (BMI: Lipid Panel: HbgA1c: QTc:)   -- Encouraged patient to participate in unit milieu and in scheduled group therapies      Signed: Baltazar Bonier, MD 07/30/2023, 7:32 AM

## 2023-07-30 NOTE — B[facility] Group Notes (Signed)
 LCSW Wellness Group Note   07/30/2023 13:30pm  Type of Group and Topic: Psychoeducational Group:  Wellness  Participation Level:  minimal  Description of Group  Wellness group introduces the topic and its focus on developing healthy habits across the spectrum and its relationship to a decrease in hospital admissions.  Six areas of wellness are discussed: physical, social spiritual, intellectual, occupational, and emotional.  Patients are asked to consider their current wellness habits and to identify areas of wellness where they are interested and able to focus on improvements.    Therapeutic Goals Patients will understand components of wellness and how they can positively impact overall health.  Patients will identify areas of wellness where they have developed good habits. Patients will identify areas of wellness where they would like to make improvements.    Summary of Patient Progress: pt appeared to be paying attention in group but did not participate in group discussion.  When called on by CSW he identified social as a positive wellness area and environmental as a wellness area that needs work.      Therapeutic Modalities: Cognitive Behavioral Therapy Psychoeducation    Elspeth Hals, LCSW

## 2023-07-30 NOTE — Plan of Care (Signed)
  Problem: Education: Goal: Mental status will improve Outcome: Progressing   Problem: Coping: Goal: Ability to demonstrate self-control will improve Outcome: Progressing   Problem: Safety: Goal: Periods of time without injury will increase Outcome: Progressing

## 2023-07-31 MED ORDER — NICOTINE POLACRILEX 2 MG MT GUM
2.0000 mg | CHEWING_GUM | OROMUCOSAL | Status: DC | PRN
  Administered 2023-07-31 – 2023-08-03 (×10): 2 mg via ORAL
  Filled 2023-07-31: qty 1

## 2023-07-31 MED ORDER — ENSURE ENLIVE PO LIQD
237.0000 mL | Freq: Two times a day (BID) | ORAL | Status: DC
  Administered 2023-08-01 (×2): 237 mL via ORAL
  Filled 2023-07-31 (×9): qty 237

## 2023-07-31 NOTE — BHH Group Notes (Signed)
 The focus of this group is to help patients review their daily goal of treatment and discuss progress on daily workbooks. Pt was attentive and appropriate during tonight's wrap up group discussion. Pt shared that overall day was good. Had minor set back. Was able to talk with peers and family. Rated the day an 7.

## 2023-07-31 NOTE — Progress Notes (Signed)
 D:  Patient denied SI and HI, contracts for safety.  Denied A/V hallucinations.  Denied pain. A:  Medications administered per MD orders.  Emotional support and encouragement given patient. R:  Safety maintained with 15 minute checks.
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
## 2023-07-31 NOTE — Plan of Care (Signed)
   Problem: Education: Goal: Mental status will improve Outcome: Progressing   Problem: Activity: Goal: Interest or engagement in activities will improve Outcome: Progressing

## 2023-07-31 NOTE — Group Note (Signed)
 Date:  07/31/2023 Time:  1:33 PM  Group Topic/Focus:  Emotional Wellness:   The focus of this group is to identify unhealthy thought patterns and how to challenge them.     Participation Level:  Minimal  Participation Quality:  Appropriate  Affect:  Appropriate  Cognitive:  Appropriate  Insight: Appropriate  Engagement in Group:  Developing/Improving  Modes of Intervention:  Education and Exploration  Additional Comments:    Joel Carr 07/31/2023, 1:33 PM

## 2023-07-31 NOTE — Group Note (Signed)
 Date:  07/31/2023 Time:  10:03 AM  Group Topic/Focus:  Goals Group:   The focus of this group is to help patients establish daily goals to achieve during treatment and discuss how the patient can incorporate goal setting into their daily lives to aide in recovery. Orientation:   The focus of this group is to educate the patient on the purpose and policies of crisis stabilization and provide a format to answer questions about their admission.  The group details unit policies and expectations of patients while admitted.    Participation Level:  Active  Participation Quality:  Appropriate  Affect:  Appropriate  Cognitive:  Appropriate  Insight: Appropriate  Engagement in Group:  Engaged  Modes of Intervention:  Discussion  Additional Comments:    Kaitlynne Wenz D Rynn Markiewicz 07/31/2023, 10:03 AM

## 2023-07-31 NOTE — Plan of Care (Signed)
 Nurse discussed anxiety, depression and coping skills with patient.

## 2023-07-31 NOTE — Progress Notes (Signed)
 [facility] MD Progress Note  07/31/2023 8:49 AM Joel Carr  MRN:  [id number]  Principal Problem: Bipolar disorder [facility]) Diagnosis: Principal Problem:   Bipolar disorder (HCC) Active Problems:   Bipolar and related disorder due to another medical condition with mixed features  Reason for admission: Joel Carr is a 38 y.o. Caucasian male with prior psychiatric history significant for bipolar 1 disorder depressed and anxiety who presents voluntarily to [facility] from [facility] ED for worsening depression resulting in suicidal ideation in the context of being in the process of divorce by the wife of 17 years.  Chart Review from last 24 hours:  The patient's chart was reviewed and nursing notes were reviewed. The patient's case was discussed in multidisciplinary team meeting. Per [facility] patient is compliant with His psychotropic medications.  No signs reviewed without critical values.  Required Atarax  1X, trazodone  1X overnight.  Information discussed during bed progression: Per RN, patient slept 6 hours overnight. No acute events overnight.  Today's assessment notes: Patient evaluated on the unit. Reports sleep is good. Reports appetite is adequate. States mood is "pretty good" today. He got updates abut his son yesterday, he feels a bit disappointed he won't be present for his prom next week. Although he worries his children will resent him for missing this, he feels that t is the right decision to follow through with placement at [facility]. He is future oriented throughout the interview. On interview, suicidal ideations are not present . Homicidal ideations are not present.   There are no auditory hallucinations, visual hallucinations, paranoid ideations, or delusional thought processes. Side effects to currently prescribed medications are none. There are no somatic complaints. Reports regular bowel movements.   Patient reports he has bene smoking since age 69, will smoke 3  ciagrretes daily for th epast couple of years.  Total Time spent with patient: 45 minutes  Past Psychiatric History: Information collected from patient, ED treatment Prev Dx/Sx: bipolar disorder Current Psych Provider: Yes, see above Home Meds (current): Lamictal .  See above Previous Med Trials: denies Therapy: yes see above Prior Psych Hospitalization: yes. One prior psych hospitalization due to SI Prior Self Harm: denies Prior Violence: denies  Past Medical History:  Past Medical History:  Diagnosis Date   Anxiety    Bipolar disorder (HCC)    Chronic pain syndrome    COVID-19 virus infection 12/23/2019   mild congestion and body aches   Depression    History of kidney stones    History of migraine    Hyperlipidemia    Pneumonia    RLS (restless legs syndrome)     Past Surgical History:  Procedure Laterality Date   ARTHRODESIS METATARSAL Left 09/19/2020   Procedure: ARTHRODESIS METATARSAL; LISFRANC MULTIPLE-1/2/3;  Surgeon: Pink Bridges, DPM;  Location: [facility] ORS;  Service: Podiatry;  Laterality: Left;   ARTHRODESIS METATARSAL Left 01/13/2023   Procedure: ARTHRODESIS; LISFRANC; MULTIPLE FOURTH AND FIFTH;  Surgeon: Pink Bridges, DPM;  Location: [facility];  Service: Orthopedics/Podiatry;  Laterality: Left;   FOOT ARTHRODESIS Left 09/19/2020   Procedure: ARTHRODESIS FOOT- *POSSIBLE BONE GRAFT;  Surgeon: Pink Bridges, DPM;  Location: [facility] ORS;  Service: Podiatry;  Laterality: Left;   FOOT ARTHRODESIS Left 01/13/2023   Procedure: AUTOGRAFT HARVEST;  Surgeon: Pink Bridges, DPM;  Location: [facility];  Service: Orthopedics/Podiatry;  Laterality: Left;   KNEE ARTHROSCOPY WITH MENISCAL REPAIR Right 10/29/2014   Procedure: KNEE ARTHROSCOPY WITH MENISCAL REPAIR;  Surgeon: Molli Angelucci, MD;  Location:  [facility] ORS;  Service: Orthopedics;  Laterality: Right;   OPEN REDUCTION INTERNAL FIXATION (ORIF) FOOT LISFRANC FRACTURE Left 09/19/2020   Procedure: OPEN REDUCTION  INTERNAL FIXATION (ORIF) FOOT LISFRANC FRACTURE x2 4/5  PERCUTANEOUS SKELETAL FIXATION OF TARSOMETATARSAL JOINT DISLOCATION WITH MANIPULATION, X2 4/5;  Surgeon: Pink Bridges, DPM;  Location: [facility] ORS;  Service: Podiatry;  Laterality: Left;   TESTICLE SURGERY N/A    as a child   TYMPANOPLASTY Bilateral 1990   Family History:  Family History  Problem Relation Age of Onset   Depression Father    Alcohol abuse Father    Diabetes Paternal Grandfather    Heart attack Paternal Grandfather    Colon cancer Neg Hx    Family Psychiatric  History: See H&P Social History:  Social History   Substance and Sexual Activity  Alcohol Use Not Currently   Comment: rarely     Social History   Substance and Sexual Activity  Drug Use No    Social History   Socioeconomic History   Marital status: Married    Spouse name: Joel Carr   Number of children: 2   Years of education: 10-   Highest education level: GED or equivalent  Occupational History   Not on file  Tobacco Use   Smoking status: Every Day    Types: Cigars, E-cigarettes   Smokeless tobacco: Never   Tobacco comments:    3 cig daily-04/29/2020  Vaping Use   Vaping status: Every Day   Substances: Nicotine , Flavoring  Substance and Sexual Activity   Alcohol use: Not Currently    Comment: rarely   Drug use: No   Sexual activity: Yes  Other Topics Concern   Not on file  Social History Narrative   Married with 2 kids. 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, prison and probation services   Social Drivers of Health   Financial Resource Strain: Medium Risk (02/14/2023)   Overall Financial Resource Strain (CARDIA)    Difficulty of Paying Living Expenses: Somewhat hard  Food Insecurity: Food Insecurity Present (07/25/2023)   Hunger Vital Sign    Worried About Running Out of Food in the Last Year: Sometimes Joel    Ran Out of Food in the Last Year: Sometimes Joel  Transportation Needs: Patient Declined (07/25/2023)   PRAPARE -  Transportation    Lack of Transportation (Medical): Patient declined    Lack of Transportation (Non-Medical): Patient declined  Physical Activity: Sufficiently Active (02/14/2023)   Exercise Vital Sign    Days of Exercise per Week: 7 days    Minutes of Exercise per Session: 30 min  Stress: No Stress Concern Present (02/14/2023)   [employer] of Occupational Health - Occupational Stress Questionnaire    Feeling of Stress : Only a little  Social Connections: Moderately Isolated (02/14/2023)   Social Connection and Isolation Panel [NHANES]    Frequency of Communication with Friends and Family: More than three times a week    Frequency of Social Gatherings with Friends and Family: Three times a week    Attends Religious Services: Never    Active Member of Clubs or Organizations: No    Attends Engineer, structural: Not on file    Marital Status: Married   Additional Social History:   Sleep: Good  Appetite:  Good  Current Medications: Current Facility-Administered Medications  Medication Dose Route Frequency Provider Last Rate Last Admin   acetaminophen  (TYLENOL ) tablet 650 mg  650 mg Oral Q6H PRN Ajibola, Ene A, NP  650 mg at 07/25/23 1623   alum & mag hydroxide-simeth (MAALOX/MYLANTA) 200-200-20 MG/5ML suspension 30 mL  30 mL Oral Q4H PRN Ajibola, Ene A, NP       amitriptyline  (ELAVIL ) tablet 25 mg  25 mg Oral QHS Ntuen, Tina C, FNP   25 mg at 07/30/23 2106   haloperidol  (HALDOL ) tablet 5 mg  5 mg Oral TID PRN Ajibola, Ene A, NP       And   diphenhydrAMINE  (BENADRYL ) capsule 50 mg  50 mg Oral TID PRN Ajibola, Ene A, NP       haloperidol  lactate (HALDOL ) injection 5 mg  5 mg Intramuscular TID PRN Ajibola, Ene A, NP       And   diphenhydrAMINE  (BENADRYL ) injection 50 mg  50 mg Intramuscular TID PRN Ajibola, Ene A, NP       And   LORazepam  (ATIVAN ) injection 2 mg  2 mg Intramuscular TID PRN Ajibola, Ene A, NP       haloperidol  lactate (HALDOL ) injection 10 mg  10 mg  Intramuscular TID PRN Ajibola, Ene A, NP       And   diphenhydrAMINE  (BENADRYL ) injection 50 mg  50 mg Intramuscular TID PRN Ajibola, Ene A, NP       And   LORazepam  (ATIVAN ) injection 2 mg  2 mg Intramuscular TID PRN Ajibola, Ene A, NP       hydrOXYzine  (ATARAX ) tablet 25 mg  25 mg Oral TID PRN Ajibola, Ene A, NP   25 mg at 07/30/23 2104   lamoTRIgine  (LAMICTAL ) tablet 100 mg  100 mg Oral BID Ntuen, Tina C, FNP   100 mg at 07/31/23 0820   magnesium  hydroxide (MILK OF MAGNESIA) suspension 30 mL  30 mL Oral Daily PRN Ajibola, Ene A, NP       nicotine  polacrilex (NICORETTE ) gum 2 mg  2 mg Oral PRN Carrion-Carrero, Greggory Safranek, MD       propranolol  ER (INDERAL  LA) 24 hr capsule 80 mg  80 mg Oral Daily Ntuen, Tina C, FNP   80 mg at 07/31/23 1610   SUMAtriptan  (IMITREX ) tablet 25 mg  25 mg Oral Q2H PRN Ntuen, Tina C, FNP       traZODone  (DESYREL ) tablet 200 mg  200 mg Oral QHS PRN Ntuen, Tina C, FNP   200 mg at 07/30/23 2105    Lab Results:  No results found for this or any previous visit (from the past 48 hours).  Blood Alcohol level:  Lab Results  Component Value Date   [facility] <15 07/24/2023   ETH <10 08/20/2020   Metabolic Disorder Labs: Lab Results  Component Value Date   HGBA1C 4.7 (L) 07/25/2023   MPG 88.19 07/25/2023   No results found for: "PROLACTIN" Lab Results  Component Value Date   CHOL 230 (H) 02/17/2023   TRIG 250.0 (H) 02/17/2023   HDL 42.30 02/17/2023   CHOLHDL 5 02/17/2023   VLDL 50.0 (H) 02/17/2023   LDLCALC 138 (H) 02/17/2023   LDLCALC 130 (H) 06/12/2021   Musculoskeletal: Strength & Muscle Tone: within normal limits Gait & Station: normal Patient leans: N/A  Psychiatric Specialty Exam:  Presentation  General Appearance:  Appropriate for Environment; Casual; Fairly Groomed  Eye Contact: Fair  Speech: Clear and Coherent; Normal Rate  Speech Volume: Normal   Mood and Affect  Mood: "Good"  Affect: Appropriate; Full Range; Congruent  Thought  Process  Thought Processes: Coherent; Goal Directed; Linear  Descriptions of Associations:Intact  Orientation:Full (Time, Place  and Person)  Thought Content:Logical; WDL  History of Schizophrenia/Schizoaffective disorder:No  Hallucinations:Hallucinations: None   Ideas of Reference:None  Suicidal Thoughts:Suicidal Thoughts: No   Homicidal Thoughts:Homicidal Thoughts: No   Sensorium  Memory: Immediate Fair  Judgment: Fair  Insight: Fair  Executive Functions  Concentration: Good  Attention Span: Good  Recall: Good  Fund of Knowledge: Good  Language: Good  Psychomotor Activity  Psychomotor Activity: Psychomotor Activity: Normal   Assets  Assets: Communication Skills; Desire for Improvement; Resilience  Sleep  Sleep: Sleep: Good   Physical Exam: Physical Exam Vitals and nursing note reviewed.  Constitutional:      Appearance: He is normal weight.  HENT:     Head: Normocephalic.     Right Ear: External ear normal.     Left Ear: External ear normal.     Nose: Nose normal.     Mouth/Throat:     Mouth: Mucous membranes are moist.     Pharynx: Oropharynx is clear.  Eyes:     Extraocular Movements: Extraocular movements intact.  Cardiovascular:     Rate and Rhythm: Normal rate.     Pulses: Normal pulses.  Pulmonary:     Effort: Pulmonary effort is normal.  Abdominal:     Comments: Deferred  Genitourinary:    Comments: Deferred Musculoskeletal:        General: Normal range of motion.     Cervical back: Normal range of motion.  Skin:    General: Skin is warm.  Neurological:     General: No focal deficit present.     Mental Status: He is alert and oriented to person, place, and time.  Psychiatric:        Mood and Affect: Mood normal.        Behavior: Behavior normal.    Review of Systems  Constitutional:  Negative for chills and fever.  HENT:  Negative for sore throat.   Eyes:  Negative for blurred vision.  Respiratory:   Negative for cough, sputum production, shortness of breath and wheezing.   Cardiovascular:  Negative for chest pain and palpitations.  Gastrointestinal:  Negative for heartburn and nausea.  Genitourinary:  Negative for dysuria, frequency and urgency.  Musculoskeletal:  Negative for falls.  Skin:  Negative for itching and rash.  Neurological:  Negative for dizziness, tingling and headaches.  Endo/Heme/Allergies:        See allergy listing  Psychiatric/Behavioral:  Positive for depression. Negative for hallucinations, substance abuse and suicidal ideas. The patient is nervous/anxious. The patient does not have insomnia.    Blood pressure 99/75, pulse (!) 56, temperature 97.6 F (36.4 C), temperature source Oral, resp. rate 18, height 5\' 8"  (1.727 m), weight 67.1 kg, SpO2 99%. Body mass index is 22.5 kg/m.   Treatment Plan Summary: Daily contact with patient to assess and evaluate symptoms and progress in treatment and Medication management   Physician Treatment Plan for Primary Diagnosis: Bipolar disorder (HCC) Long Term Goal(s): Improvement in symptoms so as ready for discharge   Short Term Goals: Ability to identify changes in lifestyle to reduce recurrence of condition will improve, Ability to verbalize feelings will improve, Ability to disclose and discuss suicidal ideas, Ability to demonstrate self-control will improve, Ability to identify and develop effective coping behaviors will improve, Ability to maintain clinical measurements within normal limits will improve, Compliance with prescribed medications will improve, and Ability to identify triggers associated with substance abuse/mental health issues will improve   Physician Treatment Plan for Secondary Diagnosis: Assessment:  KAYLAN FENGER is a 38 y.o. Caucasian male with prior psychiatric history significant for bipolar 1 disorder depressed and anxiety who presents voluntarily to [facility] from [facility]  ED for worsening depression resulting in suicidal ideation in the context of being in the process of divorce by the wife of 17 years.   Principal Problem:   Bipolar disorder (HCC)   Plans:   Safety and Monitoring:  Voluntary admission to inpatient psychiatric unit for safety, stabilization and treatment  Daily contact with patient to assess and evaluate symptoms and progress in treatment  Patient's case to be discussed in multi-disciplinary team meeting  Observation Level : q15 minute checks  Vital signs: q12 hours  Precautions: suicide, but pt currently verbally contracts for safety on unit?    Medications: Continue home medications --  Continue amitriptyline  25 mg tablets p.o. at bedtime for depression -- Gabapentin  was discontinued on 07/29/2023 -- Continue Lamictal  100 mg tablets 1 p.o. 2 times daily for bipolar -- Continue trazodone  100 mg tablets at bedtime for sleep as needed   Medications for other medical problems: -- Propranolol  ER 80 mg 24-hour capsule p.o. daily for blood pressure -- Imitrex  25 mg tablets may repeat in 2 hours for a maximum of 100 mg every 24 hours for Tension headache   Other PRN Medications  -Acetaminophen  650 mg every 6 as needed/mild pain  -Maalox 30 mL oral every 4 as needed/digestion  -Magnesium  hydroxide 30 mL daily as needed/mild constipation    Admission lab reviewed: CMP: Potassium 3.2 low, glucose 132 high, BUN 5 low, alkaline phosphatase 37 low, total protein 6.1 low, otherwise normal.  Lipid profile: Cholesterol 230 high, LDL 138 high, triglyceride 250 high, VLDL 50.  Otherwise normal.  CBC with differential: Within normal limits.  UDS: Positive for cocaine metabolites, positive for benzodiazepines, positive for tricyclic urine screen.  TSH: 1.29, within normal limits   New labs ordered: Hemoglobin A1c, vitamin B12, 25 hydroxyvitamin D,   EKG reviewed: Normal sinus rhythm, ventricular rate 63, QT/QTc 394/403   Continue BH Agitation Protocol   --Haldol  5 mg, oral, 3 times daily as needed, mild agitation  --Benadryl  50 mg, oral, 3 times daily as needed, mild agitation                                    OR   --Haldol  injection 5 mg, IM, 3 times daily as needed, moderate agitation  --Benadryl  injection 50 mg, IM, 3 times daily as needed, moderate agitation  --Ativan  injection 2 mg, IM, 3 times daily as needed, moderate agitation                                      OR  --Haldol  injection 10 mg, IM, 3 times daily as needed, severe agitation  --Benadryl  injection 50 mg, IM, 3 times daily as needed, severe agitation  --Ativan  injection 2 mg, IM, 3 times daily as needed, severe agitation    --  The risks/benefits/side-effects/alternatives to this medication were discussed in detail with the patient and time was given for questions. The patient consents to medication trial.   -- Metabolic profile and EKG monitoring obtained while on an atypical antipsychotic (BMI: Lipid Panel: HbgA1c: QTc:)   -- Encouraged patient to participate in unit milieu and in scheduled group therapies  Signed: Baltazar Bonier, MD 07/31/2023, 8:49 AM

## 2023-07-31 NOTE — Progress Notes (Signed)
   07/31/23 0529  15 Minute Checks  Location Bedroom  Visual Appearance Calm  Behavior Sleeping  Sleep (Behavioral Health Patients Only)  Calculate sleep? (Click Yes once per 24 hr at 0600 safety check) Yes  Documented sleep last 24 hours 7.75

## 2023-08-01 DIAGNOSIS — F0634 Mood disorder due to known physiological condition with mixed features: Secondary | ICD-10-CM | POA: Diagnosis not present

## 2023-08-01 NOTE — [facility] Group Notes (Signed)
 Spiritual care group on grief and loss  Group Goal: *Support / Education around grief and loss *Members engage in facilitated group support and psycho-social education.  Group Description: Following introductions and group rules, group members engaged in facilitated group dialog and support around topic of loss, with particular support around experiences of loss in their lives. Group Identified types of loss (relationships / self / things) and identified patterns, circumstances, and changes that precipitate losses. Reflected on thoughts / feelings around loss, normalized grief responses, and recognized variety in grief experience. Group noted Worden's four tasks of grief in discussion.  Group drew on Adlerian / Rogerian, narrative, MI, and Yalom's theoretical frameworks of group dynamics.  Observations: Joel Carr was an active participant in the group discussion.  Inanna Telford L. Minetta Aly, M.Div [phone]

## 2023-08-01 NOTE — Group Note (Signed)
 Recreation Therapy Group Note   Group Topic:Problem Solving  Group Date: 08/01/2023 Start Time: 0930 End Time: 1000 Facilitators: Rutledge Selsor-McCall, LRT,CTRS Location: [address] Dayroom   Group Topic: Communication, Team Building, Problem Solving  Goal Area(s) Addresses:  Patient will effectively work with peer towards shared goal.  Patient will identify skills used to make activity successful.  Patient will identify how skills used during activity can be used to reach post d/c goals.   Intervention: STEM Activity  Activity: Straw Bridge. In teams of 3-5, patients were given 15 plastic drinking straws and an equal length of masking tape. Using the materials provided, patients were instructed to build a free standing bridge-like structure to suspend an everyday item (ex: puzzle box) off of the floor or table surface. All materials were required to be used by the team in their design. LRT facilitated post-activity discussion reviewing team process. Patients were encouraged to reflect how the skills used in this activity can be generalized to daily life post discharge.   Education: Pharmacist, community, Scientist, physiological, Discharge Planning   Education Outcome: Acknowledges education/In group clarification offered/Needs additional education.    Affect/Mood: Appropriate   Participation Level: Engaged   Participation Quality: Independent   Behavior: Appropriate   Speech/Thought Process: Focused   Insight: Good   Judgement: Good   Modes of Intervention: STEM Activity   Patient Response to Interventions:  Engaged   Education Outcome:  In group clarification offered    Clinical Observations/Individualized Feedback: Pt was engaged and worked well with peers. Pt was social and active throughout group.     Plan: Continue to engage patient in RT group sessions 2-3x/week.   Hawkin Charo-McCall, LRT,CTRS 08/01/2023 12:53 PM

## 2023-08-01 NOTE — Progress Notes (Signed)
 Patient rated his depression level 0/10 and his anxiety level 2/10 with 10 being the highest and 0 none. Patient identified his goal for today as, "Getting some updates on my discharge plan to the program". Medication and group compliant. Patient observed interacting well with some peers. Appetite good on safety maintained.  08/01/23 0915  Psych Admission Type (Psych Patients Only)  Admission Status Voluntary  Psychosocial Assessment  Patient Complaints Anxiety  Eye Contact Fair  Facial Expression Animated  Affect Appropriate to circumstance  Speech Logical/coherent  Interaction Assertive  Motor Activity Other (Comment) (WNL)  Appearance/Hygiene Unremarkable  Behavior Characteristics Appropriate to situation  Mood Anxious;Pleasant  Thought Process  Coherency WDL  Content WDL  Delusions None reported or observed  Perception WDL  Hallucination None reported or observed  Judgment Impaired  Confusion None  Danger to Self  Current suicidal ideation? Denies  Agreement Not to Harm Self Yes  Description of Agreement Verbal  Danger to Others  Danger to Others None reported or observed

## 2023-08-01 NOTE — Progress Notes (Signed)
 [facility] MD Progress Note  08/01/2023 10:44 AM Joel Carr  MRN:  [id number]  Principal Problem: Bipolar disorder [facility]) Diagnosis: Principal Problem:   Bipolar disorder (HCC) Active Problems:   Bipolar and related disorder due to another medical condition with mixed features  Reason for admission: Joel Carr is a 38 y.o. Caucasian male with prior psychiatric history significant for bipolar 1 disorder depressed and anxiety who presents voluntarily to [facility] from [facility] ED for worsening depression resulting in suicidal ideation in the context of being in the process of divorce by the wife of 17 years.  Chart Review from last 24 hours:  The patient's chart was reviewed and nursing notes were reviewed. The patient's case was discussed in multidisciplinary team meeting. Per [facility] patient is compliant with His psychotropic medications.  No signs reviewed without critical values.  Using Atarax  as needed for anxiety once to twice daily, using trazodone  as needed for sleep nightly.  Information discussed during bed progression: Per RN, patient slept 7 hours overnight. No acute events overnight.  Today's assessment notes: Patient was evaluated on the unit this morning.  He continues to report feeling better in general with improved mood, denies depressed mood or anxiety, continues to deny passive or active SI intention or plan, denies HI or AVH, denies side effect to current medication regimen.  Denies craving to illicit drug use, reports good sleep and appetite.  He does agree to comply with medications after discharge.  He continues to be motivated and hopeful to get admitted to [place] program, I discussed with him to contact the program this morning to follow-up regard bed availability, will follow. Patient denies any symptoms consistent with mania or hypomania, presents stable with improvement since admission.   Total Time spent with patient:  35 minutes  Past  Psychiatric History: Information collected from patient, ED treatment Prev Dx/Sx: bipolar disorder Current Psych Provider: Yes, see above Home Meds (current): Lamictal .  See above Previous Med Trials: denies Therapy: yes see above Prior Psych Hospitalization: yes. One prior psych hospitalization due to SI Prior Self Harm: denies Prior Violence: denies  Past Medical History:  Past Medical History:  Diagnosis Date   Anxiety    Bipolar disorder (HCC)    Chronic pain syndrome    COVID-19 virus infection 12/23/2019   mild congestion and body aches   Depression    History of kidney stones    History of migraine    Hyperlipidemia    Pneumonia    RLS (restless legs syndrome)     Past Surgical History:  Procedure Laterality Date   ARTHRODESIS METATARSAL Left 09/19/2020   Procedure: ARTHRODESIS METATARSAL; LISFRANC MULTIPLE-1/2/3;  Surgeon: Pink Bridges, DPM;  Location: [facility] ORS;  Service: Podiatry;  Laterality: Left;   ARTHRODESIS METATARSAL Left 01/13/2023   Procedure: ARTHRODESIS; LISFRANC; MULTIPLE FOURTH AND FIFTH;  Surgeon: Pink Bridges, DPM;  Location: [facility];  Service: Orthopedics/Podiatry;  Laterality: Left;   FOOT ARTHRODESIS Left 09/19/2020   Procedure: ARTHRODESIS FOOT- *POSSIBLE BONE GRAFT;  Surgeon: Pink Bridges, DPM;  Location: [facility] ORS;  Service: Podiatry;  Laterality: Left;   FOOT ARTHRODESIS Left 01/13/2023   Procedure: AUTOGRAFT HARVEST;  Surgeon: Pink Bridges, DPM;  Location: [facility];  Service: Orthopedics/Podiatry;  Laterality: Left;   KNEE ARTHROSCOPY WITH MENISCAL REPAIR Right 10/29/2014   Procedure: KNEE ARTHROSCOPY WITH MENISCAL REPAIR;  Surgeon: Molli Angelucci, MD;  Location: [facility] ORS;  Service: Orthopedics;  Laterality: Right;   OPEN REDUCTION INTERNAL  FIXATION (ORIF) FOOT LISFRANC FRACTURE Left 09/19/2020   Procedure: OPEN REDUCTION INTERNAL FIXATION (ORIF) FOOT LISFRANC FRACTURE x2 4/5  PERCUTANEOUS SKELETAL FIXATION OF TARSOMETATARSAL  JOINT DISLOCATION WITH MANIPULATION, X2 4/5;  Surgeon: Pink Bridges, DPM;  Location: [facility] ORS;  Service: Podiatry;  Laterality: Left;   TESTICLE SURGERY N/A    as a child   TYMPANOPLASTY Bilateral 1990   Family History:  Family History  Problem Relation Age of Onset   Depression Father    Alcohol abuse Father    Diabetes Paternal Grandfather    Heart attack Paternal Grandfather    Colon cancer Neg Hx    Family Psychiatric  History: See H&P Social History:  Social History   Substance and Sexual Activity  Alcohol Use Not Currently   Comment: rarely     Social History   Substance and Sexual Activity  Drug Use No    Social History   Socioeconomic History   Marital status: Married    Spouse name: True Fuss   Number of children: 2   Years of education: 10-   Highest education level: GED or equivalent  Occupational History   Not on file  Tobacco Use   Smoking status: Every Day    Types: Cigars, E-cigarettes   Smokeless tobacco: Never   Tobacco comments:    3 cig daily-04/29/2020  Vaping Use   Vaping status: Every Day   Substances: Nicotine , Flavoring  Substance and Sexual Activity   Alcohol use: Not Currently    Comment: rarely   Drug use: No   Sexual activity: Yes  Other Topics Concern   Not on file  Social History Narrative   Married with 2 kids. 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, prison and probation services   Social Drivers of Health   Financial Resource Strain: Medium Risk (02/14/2023)   Overall Financial Resource Strain (CARDIA)    Difficulty of Paying Living Expenses: Somewhat hard  Food Insecurity: Food Insecurity Present (07/25/2023)   Hunger Vital Sign    Worried About Running Out of Food in the Last Year: Sometimes true    Ran Out of Food in the Last Year: Sometimes true  Transportation Needs: Patient Declined (07/25/2023)   PRAPARE - Transportation    Lack of Transportation (Medical): Patient declined    Lack of Transportation (Non-Medical):  Patient declined  Physical Activity: Sufficiently Active (02/14/2023)   Exercise Vital Sign    Days of Exercise per Week: 7 days    Minutes of Exercise per Session: 30 min  Stress: No Stress Concern Present (02/14/2023)   [employer] of Occupational Health - Occupational Stress Questionnaire    Feeling of Stress : Only a little  Social Connections: Moderately Isolated (02/14/2023)   Social Connection and Isolation Panel [NHANES]    Frequency of Communication with Friends and Family: More than three times a week    Frequency of Social Gatherings with Friends and Family: Three times a week    Attends Religious Services: Never    Active Member of Clubs or Organizations: No    Attends Engineer, structural: Not on file    Marital Status: Married   Additional Social History:   Sleep: Good  Appetite:  Good  Current Medications: Current Facility-Administered Medications  Medication Dose Route Frequency Provider Last Rate Last Admin   acetaminophen  (TYLENOL ) tablet 650 mg  650 mg Oral Q6H PRN Ajibola, Ene A, NP   650 mg at 07/25/23 1623   alum & mag hydroxide-simeth (MAALOX/MYLANTA)  200-200-20 MG/5ML suspension 30 mL  30 mL Oral Q4H PRN Ajibola, Ene A, NP       amitriptyline  (ELAVIL ) tablet 25 mg  25 mg Oral QHS Ntuen, Tina C, FNP   25 mg at 07/31/23 2115   haloperidol  (HALDOL ) tablet 5 mg  5 mg Oral TID PRN Ajibola, Ene A, NP       And   diphenhydrAMINE  (BENADRYL ) capsule 50 mg  50 mg Oral TID PRN Ajibola, Ene A, NP       haloperidol  lactate (HALDOL ) injection 5 mg  5 mg Intramuscular TID PRN Ajibola, Ene A, NP       And   diphenhydrAMINE  (BENADRYL ) injection 50 mg  50 mg Intramuscular TID PRN Ajibola, Ene A, NP       And   LORazepam  (ATIVAN ) injection 2 mg  2 mg Intramuscular TID PRN Ajibola, Ene A, NP       haloperidol  lactate (HALDOL ) injection 10 mg  10 mg Intramuscular TID PRN Ajibola, Ene A, NP       And   diphenhydrAMINE  (BENADRYL ) injection 50 mg  50 mg  Intramuscular TID PRN Ajibola, Ene A, NP       And   LORazepam  (ATIVAN ) injection 2 mg  2 mg Intramuscular TID PRN Ajibola, Ene A, NP       feeding supplement (ENSURE ENLIVE / ENSURE PLUS) liquid 237 mL  237 mL Oral BID BM Zouev, Dmitri, MD       hydrOXYzine  (ATARAX ) tablet 25 mg  25 mg Oral TID PRN Ajibola, Ene A, NP   25 mg at 07/31/23 1259   lamoTRIgine  (LAMICTAL ) tablet 100 mg  100 mg Oral BID Ntuen, Tina C, FNP   100 mg at 08/01/23 0740   magnesium  hydroxide (MILK OF MAGNESIA) suspension 30 mL  30 mL Oral Daily PRN Ajibola, Ene A, NP       nicotine  polacrilex (NICORETTE ) gum 2 mg  2 mg Oral PRN Carrion-Carrero, Margely, MD   2 mg at 08/01/23 0740   propranolol  ER (INDERAL  LA) 24 hr capsule 80 mg  80 mg Oral Daily Ntuen, Tina C, FNP   80 mg at 08/01/23 0740   SUMAtriptan  (IMITREX ) tablet 25 mg  25 mg Oral Q2H PRN Ntuen, Tina C, FNP       traZODone  (DESYREL ) tablet 200 mg  200 mg Oral QHS PRN Ntuen, Tina C, FNP   200 mg at 07/31/23 2115    Lab Results:  No results found for this or any previous visit (from the past 48 hours).  Blood Alcohol level:  Lab Results  Component Value Date   [facility] <15 07/24/2023   ETH <10 08/20/2020   Metabolic Disorder Labs: Lab Results  Component Value Date   HGBA1C 4.7 (L) 07/25/2023   MPG 88.19 07/25/2023   No results found for: "PROLACTIN" Lab Results  Component Value Date   CHOL 230 (H) 02/17/2023   TRIG 250.0 (H) 02/17/2023   HDL 42.30 02/17/2023   CHOLHDL 5 02/17/2023   VLDL 50.0 (H) 02/17/2023   LDLCALC 138 (H) 02/17/2023   LDLCALC 130 (H) 06/12/2021   Musculoskeletal: Strength & Muscle Tone: within normal limits Gait & Station: normal Patient leans: N/A  Psychiatric Specialty Exam:  Presentation  General Appearance:  Appropriate for Environment; Casual; Fairly Groomed  Eye Contact: Fair  Speech: Clear and Coherent; Normal Rate  Speech Volume: Normal   Mood and Affect  Mood: "Good"  Affect: Appropriate; Full Range;  Congruent  Thought  Process  Thought Processes: Coherent; Goal Directed; Linear  Descriptions of Associations:Intact  Orientation:Full (Time, Place and Person)  Thought Content:Logical; WDL  History of Schizophrenia/Schizoaffective disorder:No  Hallucinations:Hallucinations: None   Ideas of Reference:None  Suicidal Thoughts:Suicidal Thoughts: No   Homicidal Thoughts:Homicidal Thoughts: No   Sensorium  Memory: Immediate Fair  Judgment: Fair  Insight: Fair  Executive Functions  Concentration: Good  Attention Span: Good  Recall: Good  Fund of Knowledge: Good  Language: Good  Psychomotor Activity  Psychomotor Activity: Psychomotor Activity: Normal   Assets  Assets: Communication Skills; Desire for Improvement; Resilience  Sleep  Sleep: Sleep: Good   Physical Exam: Physical Exam Vitals and nursing note reviewed.  Constitutional:      Appearance: He is normal weight.  HENT:     Head: Normocephalic.     Right Ear: External ear normal.     Left Ear: External ear normal.     Nose: Nose normal.     Mouth/Throat:     Mouth: Mucous membranes are moist.     Pharynx: Oropharynx is clear.  Eyes:     Extraocular Movements: Extraocular movements intact.  Cardiovascular:     Rate and Rhythm: Normal rate.     Pulses: Normal pulses.  Pulmonary:     Effort: Pulmonary effort is normal.  Abdominal:     Comments: Deferred  Genitourinary:    Comments: Deferred Musculoskeletal:        General: Normal range of motion.     Cervical back: Normal range of motion.  Skin:    General: Skin is warm.  Neurological:     General: No focal deficit present.     Mental Status: He is alert and oriented to person, place, and time.  Psychiatric:        Mood and Affect: Mood normal.        Behavior: Behavior normal.    Review of Systems  Constitutional:  Negative for chills and fever.  HENT:  Negative for sore throat.   Eyes:  Negative for blurred vision.   Respiratory:  Negative for cough, sputum production, shortness of breath and wheezing.   Cardiovascular:  Negative for chest pain and palpitations.  Gastrointestinal:  Negative for heartburn and nausea.  Genitourinary:  Negative for dysuria, frequency and urgency.  Musculoskeletal:  Negative for falls.  Skin:  Negative for itching and rash.  Neurological:  Negative for dizziness, tingling and headaches.  Endo/Heme/Allergies:        See allergy listing  Psychiatric/Behavioral:  Negative for depression, hallucinations, substance abuse and suicidal ideas. The patient is not nervous/anxious and does not have insomnia.   All other systems reviewed and are negative.  Blood pressure 113/76, pulse 81, temperature 98 F (36.7 C), temperature source Oral, resp. rate 18, height 5\' 8"  (1.727 m), weight 67.1 kg, SpO2 100%. Body mass index is 22.5 kg/m.   Treatment Plan Summary: Daily contact with patient to assess and evaluate symptoms and progress in treatment and Medication management   Physician Treatment Plan for Primary Diagnosis: Bipolar disorder (HCC) Long Term Goal(s): Improvement in symptoms so as ready for discharge   Short Term Goals: Ability to identify changes in lifestyle to reduce recurrence of condition will improve, Ability to verbalize feelings will improve, Ability to disclose and discuss suicidal ideas, Ability to demonstrate self-control will improve, Ability to identify and develop effective coping behaviors will improve, Ability to maintain clinical measurements within normal limits will improve, Compliance with prescribed medications will improve, and Ability to  identify triggers associated with substance abuse/mental health issues will improve   Physician Treatment Plan for Secondary Diagnosis: Assessment:  Joel Carr is a 38 y.o. Caucasian male with prior psychiatric history significant for bipolar 1 disorder depressed and anxiety who [facility] ED for worsening depression resulting in suicidal ideation in the context of being in the process of divorce by the wife of 17 years.   Principal Problem:   Bipolar disorder (HCC)   Plans:   Safety and Monitoring:  Voluntary admission to inpatient psychiatric unit for safety, stabilization and treatment  Daily contact with patient to assess and evaluate symptoms and progress in treatment  Patient's case to be discussed in multi-disciplinary team meeting  Observation Level : q15 minute checks  Vital signs: q12 hours  Precautions: suicide, but pt currently verbally contracts for safety on unit?    Medications: Continue home medications --  Continue amitriptyline  25 mg tablets p.o. at bedtime for depression -- Gabapentin  was discontinued on 07/29/2023 -- Continue Lamictal  100 mg tablets 1 p.o. 2 times daily for bipolar -- Continue trazodone  100 mg tablets at bedtime for sleep as needed   Medications for other medical problems: -- Propranolol  ER 80 mg 24-hour capsule p.o. daily for blood pressure -- Imitrex  25 mg tablets may repeat in 2 hours for a maximum of 100 mg every 24 hours for Tension headache   Other PRN Medications  -Acetaminophen  650 mg every 6 as needed/mild pain  -Maalox 30 mL oral every 4 as needed/digestion  -Magnesium  hydroxide 30 mL daily as needed/mild constipation    Admission lab reviewed: CMP: Potassium 3.2 low, glucose 132 high, BUN 5 low, alkaline phosphatase 37 low, total protein 6.1 low, otherwise normal.  Lipid profile: Cholesterol 230 high, LDL 138 high, triglyceride 250 high, VLDL 50.  Otherwise normal.  CBC with differential: Within normal limits.  UDS: Positive for cocaine metabolites, positive for benzodiazepines, positive for tricyclic urine screen.  TSH: 1.29, within normal limits Vitamin B12 within normal level, vitamin D  within normal level, hemoglobin A1c 4.7   EKG reviewed: Normal sinus rhythm, ventricular rate  63, QT/QTc 394/403   Continue BH Agitation Protocol  --Haldol  5 mg, oral, 3 times daily as needed, mild agitation  --Benadryl  50 mg, oral, 3 times daily as needed, mild agitation                                    OR   --Haldol  injection 5 mg, IM, 3 times daily as needed, moderate agitation  --Benadryl  injection 50 mg, IM, 3 times daily as needed, moderate agitation  --Ativan  injection 2 mg, IM, 3 times daily as needed, moderate agitation                                      OR  --Haldol  injection 10 mg, IM, 3 times daily as needed, severe agitation  --Benadryl  injection 50 mg, IM, 3 times daily as needed, severe agitation  --Ativan  injection 2 mg, IM, 3 times daily as needed, severe agitation    --  The risks/benefits/side-effects/alternatives to this medication were discussed in detail with the patient and time was given for questions. The patient consents to medication trial.   -- Metabolic profile and EKG monitoring obtained while on an atypical antipsychotic (  BMI: Lipid Panel: HbgA1c: QTc:)   -- Encouraged patient to participate in unit milieu and in scheduled group therapies      Signed: Alver Jobs, MD 08/01/2023, 10:44 AM

## 2023-08-01 NOTE — Group Note (Signed)
 Date:  08/01/2023 Time:  9:07 AM  Group Topic/Focus:  Goals Group:   The focus of this group is to help patients establish daily goals to achieve during treatment and discuss how the patient can incorporate goal setting into their daily lives to aide in recovery.    Participation Level:  Active  Participation Quality:  Appropriate  Affect:  Appropriate    Joel Carr 08/01/2023, 9:07 AM

## 2023-08-01 NOTE — Progress Notes (Signed)
   08/01/23 0530  15 Minute Checks  Location Bedroom  Visual Appearance Calm  Behavior Sleeping  Sleep (Behavioral Health Patients Only)  Calculate sleep? (Click Yes once per 24 hr at 0600 safety check) Yes  Documented sleep last 24 hours 8

## 2023-08-01 NOTE — [facility] Group Notes (Signed)
 [facility] Group Notes:  (Nursing/MHT/Case Management/Adjunct)  Date:  08/01/2023  Time:  9:50 PM  Type of Therapy:  Psychoeducational Skills  Participation Level:  Minimal  Participation Quality:  Attentive  Affect:  Appropriate  Cognitive:  Appropriate  Insight:  Limited  Engagement in Group:  Developing/Improving  Modes of Intervention:  Education  Summary of Progress/Problems: The patient attended the evening A.A. speaker's meeting and was appropriate.   Psalm Schappell S 08/01/2023, 9:50 PM

## 2023-08-01 NOTE — Plan of Care (Signed)
   Problem: Activity: Goal: Interest or engagement in activities will improve Outcome: Progressing Goal: Sleeping patterns will improve Outcome: Progressing   Problem: Coping: Goal: Ability to verbalize frustrations and anger appropriately will improve Outcome: Progressing Goal: Ability to demonstrate self-control will improve Outcome: Progressing   Problem: Safety: Goal: Periods of time without injury will increase Outcome: Progressing   Problem: Physical Regulation: Goal: Ability to maintain clinical measurements within normal limits will improve Outcome: Progressing

## 2023-08-01 NOTE — Plan of Care (Signed)
   Problem: Education: Goal: Emotional status will improve Outcome: Progressing Goal: Mental status will improve Outcome: Progressing   Problem: Activity: Goal: Interest or engagement in activities will improve Outcome: Progressing Goal: Sleeping patterns will improve Outcome: Progressing   Problem: Safety: Goal: Periods of time without injury will increase Outcome: Progressing

## 2023-08-01 NOTE — Plan of Care (Signed)
  Problem: Education: Goal: Mental status will improve Outcome: Progressing   Problem: Coping: Goal: Ability to verbalize frustrations and anger appropriately will improve Outcome: Progressing   Problem: Coping: Goal: Ability to demonstrate self-control will improve Outcome: Progressing

## 2023-08-02 DIAGNOSIS — F3175 Bipolar disorder, in partial remission, most recent episode depressed: Secondary | ICD-10-CM | POA: Diagnosis not present

## 2023-08-02 NOTE — Progress Notes (Addendum)
 Patient denies SI/HI/AVH this morning. Pt rates their depression a 1/10 and anxiety a 4/10. Pt reports that they slept "fair" last night. Pt reports that his goal for today is to "enjoy my birthday and work on getting a discharge plan". Pt complains of 5/10 pain in his left foot, PRN tylenol  administered for pain. PRN hydroxyzine  administered per [facility] for anxiety. Pt has been interactive on the unit and participating in groups throughout the day. Pt has been calm and cooperative throughout the day. Patient has been compliant with medications and treatment plan. Q 15 minute safety checks are in place for patient's safety. Patient is currently safe on the unit.   08/02/23 9604  Psych Admission Type (Psych Patients Only)  Admission Status Voluntary  Psychosocial Assessment  Patient Complaints Anxiety;Depression  Eye Contact Fair  Facial Expression Animated  Affect Appropriate to circumstance  Speech Logical/coherent  Interaction Assertive  Motor Activity Other (Comment) (WDL)  Appearance/Hygiene Unremarkable  Behavior Characteristics Cooperative;Appropriate to situation  Mood Anxious;Pleasant  Thought Process  Coherency WDL  Content WDL  Delusions None reported or observed  Perception WDL  Hallucination None reported or observed  Judgment Impaired  Confusion None  Danger to Self  Current suicidal ideation? Denies  Description of Suicide Plan N/A  Self-Injurious Behavior No self-injurious ideation or behavior indicators observed or expressed   Agreement Not to Harm Self Yes  Description of Agreement verbal  Danger to Others  Danger to Others None reported or observed

## 2023-08-02 NOTE — [facility] Group Notes (Signed)
 Adult Psychoeducational Group Note  Date:  08/02/2023 Time:  12:12 PM  Group Topic/Focus:  Goals Group:   The focus of this group is to help patients establish daily goals to achieve during treatment and discuss how the patient can incorporate goal setting into their daily lives to aide in recovery.  Participation Level:  Active  Participation Quality:  Appropriate  Affect:  Appropriate  Cognitive:  Alert  Insight: Appropriate  Engagement in Group:  Engaged  Modes of Intervention:  Orientation  Additional Comments:  Pt goal for today is to have a good day and remain positive.   Jonel Nephew 08/02/2023, 12:12 PM

## 2023-08-02 NOTE — Group Note (Signed)
 Recreation Therapy Group Note   Group Topic:Animal Assisted Therapy   Group Date: 08/02/2023 Start Time: 0945 End Time: 1030 Facilitators: Jann Milkovich-McCall, LRT,CTRS Location: [address] Dayroom  Animal-Assisted Activity (AAA) Program Checklist/Progress Notes Patient Eligibility Criteria Checklist & Daily Group note for Rec Tx Intervention  AAA/T Program Assumption of Risk Form signed by Patient/ or Parent Legal Guardian Yes  Patient is free of allergies or severe asthma Yes  Patient reports no fear of animals Yes  Patient reports no history of cruelty to animals Yes  Patient understands his/her participation is voluntary Yes  Patient washes hands before animal contact Yes  Patient washes hands after animal contact Yes  Education: Hand Washing, Appropriate Animal Interaction   Education Outcome: Acknowledges education.    Affect/Mood: N/A   Participation Level: Did not attend    Clinical Observations/Individualized Feedback:     Plan: Continue to engage patient in RT group sessions 2-3x/week.   Joel Carr, LRT,CTRS  08/02/2023 12:42 PM

## 2023-08-02 NOTE — Progress Notes (Signed)
   08/02/23 1500  Spiritual Encounters  Type of Visit Follow up  Care provided to: Patient  Referral source Chaplain assessment  Spiritual Framework  Presenting Themes Goals in life/care   I visited briefly with Joel Carr as units gathered to go outside.  Joel Carr debriefed with me around goals to go to [facility]. He also shared feeling good in things he can do to help others (initiated by a peer who mentioned he had loaned a sweatshirt).  I affirmed Joel Carr goals and positive choices. I offered compassionate presence and relational support, encouraging staying in the present moment and patience with self.  Will try to see before discharge. Joel Carr, M.Div [phone]

## 2023-08-02 NOTE — Progress Notes (Signed)
 [facility] MD Progress Note  08/02/2023 7:18 PM JAMMEL HANKO  MRN:  [id number]  Principal Problem: Bipolar disorder [facility]) Diagnosis: Principal Problem:   Bipolar disorder (HCC) Active Problems:   Bipolar and related disorder due to another medical condition with mixed features  Reason for admission: MICHAEL DAFOE is a 38 y.o. Caucasian male with prior psychiatric history significant for bipolar 1 disorder depressed and anxiety who presents voluntarily to [facility] from [facility] ED for worsening depression resulting in suicidal ideation in the context of being in the process of divorce by the wife of 17 years.    Today's assessment notes: Patient was evaluated on the unit this morning. Patient reports "good" mood and is euthymic on evaluation. Patient states he is interested in going to [facility] but does not wish to go there directly and would like to go home tomorrow. Reports good sleep and appetite. Denies feeling hopeless. He continues to report feeling better in general with improved mood, denies depressed mood or anxiety, continues to deny passive or active SI intention or plan, denies HI or AVH, denies side effect to current medication regimen.   Total Time spent with patient:  35 minutes  Past Psychiatric History: Information collected from patient, ED treatment Prev Dx/Sx: bipolar disorder Current Psych Provider: Yes, see above Home Meds (current): Lamictal .  See above Previous Med Trials: denies Therapy: yes see above Prior Psych Hospitalization: yes. One prior psych hospitalization due to SI Prior Self Harm: denies Prior Violence: denies  Past Medical History:  Past Medical History:  Diagnosis Date   Anxiety    Bipolar disorder (HCC)    Chronic pain syndrome    COVID-19 virus infection 12/23/2019   mild congestion and body aches   Depression    History of kidney stones    History of migraine    Hyperlipidemia    Pneumonia    RLS (restless legs  syndrome)     Past Surgical History:  Procedure Laterality Date   ARTHRODESIS METATARSAL Left 09/19/2020   Procedure: ARTHRODESIS METATARSAL; LISFRANC MULTIPLE-1/2/3;  Surgeon: Pink Bridges, DPM;  Location: [facility] ORS;  Service: Podiatry;  Laterality: Left;   ARTHRODESIS METATARSAL Left 01/13/2023   Procedure: ARTHRODESIS; LISFRANC; MULTIPLE FOURTH AND FIFTH;  Surgeon: Pink Bridges, DPM;  Location: [facility];  Service: Orthopedics/Podiatry;  Laterality: Left;   FOOT ARTHRODESIS Left 09/19/2020   Procedure: ARTHRODESIS FOOT- *POSSIBLE BONE GRAFT;  Surgeon: Pink Bridges, DPM;  Location: [facility] ORS;  Service: Podiatry;  Laterality: Left;   FOOT ARTHRODESIS Left 01/13/2023   Procedure: AUTOGRAFT HARVEST;  Surgeon: Pink Bridges, DPM;  Location: [facility];  Service: Orthopedics/Podiatry;  Laterality: Left;   KNEE ARTHROSCOPY WITH MENISCAL REPAIR Right 10/29/2014   Procedure: KNEE ARTHROSCOPY WITH MENISCAL REPAIR;  Surgeon: Molli Angelucci, MD;  Location: [facility] ORS;  Service: Orthopedics;  Laterality: Right;   OPEN REDUCTION INTERNAL FIXATION (ORIF) FOOT LISFRANC FRACTURE Left 09/19/2020   Procedure: OPEN REDUCTION INTERNAL FIXATION (ORIF) FOOT LISFRANC FRACTURE x2 4/5  PERCUTANEOUS SKELETAL FIXATION OF TARSOMETATARSAL JOINT DISLOCATION WITH MANIPULATION, X2 4/5;  Surgeon: Pink Bridges, DPM;  Location: [facility] ORS;  Service: Podiatry;  Laterality: Left;   TESTICLE SURGERY N/A    as a child   TYMPANOPLASTY Bilateral 1990   Family History:  Family History  Problem Relation Age of Onset   Depression Father    Alcohol abuse Father    Diabetes Paternal Grandfather    Heart attack Paternal Grandfather    Colon cancer  Neg Hx    Family Psychiatric  History: See H&P Social History:  Social History   Substance and Sexual Activity  Alcohol Use Not Currently   Comment: rarely     Social History   Substance and Sexual Activity  Drug Use No    Social History   Socioeconomic History    Marital status: Married    Spouse name: True Fuss   Number of children: 2   Years of education: 10-   Highest education level: GED or equivalent  Occupational History   Not on file  Tobacco Use   Smoking status: Every Day    Types: Cigars, E-cigarettes   Smokeless tobacco: Never   Tobacco comments:    3 cig daily-04/29/2020  Vaping Use   Vaping status: Every Day   Substances: Nicotine , Flavoring  Substance and Sexual Activity   Alcohol use: Not Currently    Comment: rarely   Drug use: No   Sexual activity: Yes  Other Topics Concern   Not on file  Social History Narrative   Married with 2 kids. 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, prison and probation services   Social Drivers of Health   Financial Resource Strain: Medium Risk (02/14/2023)   Overall Financial Resource Strain (CARDIA)    Difficulty of Paying Living Expenses: Somewhat hard  Food Insecurity: Food Insecurity Present (07/25/2023)   Hunger Vital Sign    Worried About Running Out of Food in the Last Year: Sometimes true    Ran Out of Food in the Last Year: Sometimes true  Transportation Needs: Patient Declined (07/25/2023)   PRAPARE - Transportation    Lack of Transportation (Medical): Patient declined    Lack of Transportation (Non-Medical): Patient declined  Physical Activity: Sufficiently Active (02/14/2023)   Exercise Vital Sign    Days of Exercise per Week: 7 days    Minutes of Exercise per Session: 30 min  Stress: No Stress Concern Present (02/14/2023)   [employer] of Occupational Health - Occupational Stress Questionnaire    Feeling of Stress : Only a little  Social Connections: Moderately Isolated (02/14/2023)   Social Connection and Isolation Panel [NHANES]    Frequency of Communication with Friends and Family: More than three times a week    Frequency of Social Gatherings with Friends and Family: Three times a week    Attends Religious Services: Never    Active Member of Clubs or  Organizations: No    Attends Engineer, structural: Not on file    Marital Status: Married   Additional Social History:   Sleep: Good  Appetite:  Good  Current Medications: Current Facility-Administered Medications  Medication Dose Route Frequency Provider Last Rate Last Admin   acetaminophen  (TYLENOL ) tablet 650 mg  650 mg Oral Q6H PRN Ajibola, Ene A, NP   650 mg at 08/02/23 1052   alum & mag hydroxide-simeth (MAALOX/MYLANTA) 200-200-20 MG/5ML suspension 30 mL  30 mL Oral Q4H PRN Ajibola, Ene A, NP       amitriptyline  (ELAVIL ) tablet 25 mg  25 mg Oral QHS Ntuen, Tina C, FNP   25 mg at 08/01/23 2137   haloperidol  (HALDOL ) tablet 5 mg  5 mg Oral TID PRN Ajibola, Ene A, NP       And   diphenhydrAMINE  (BENADRYL ) capsule 50 mg  50 mg Oral TID PRN Ajibola, Ene A, NP       haloperidol  lactate (HALDOL ) injection 5 mg  5 mg Intramuscular TID PRN Ajibola, Ene A,  NP       And   diphenhydrAMINE  (BENADRYL ) injection 50 mg  50 mg Intramuscular TID PRN Ajibola, Ene A, NP       And   LORazepam  (ATIVAN ) injection 2 mg  2 mg Intramuscular TID PRN Ajibola, Ene A, NP       haloperidol  lactate (HALDOL ) injection 10 mg  10 mg Intramuscular TID PRN Ajibola, Ene A, NP       And   diphenhydrAMINE  (BENADRYL ) injection 50 mg  50 mg Intramuscular TID PRN Ajibola, Ene A, NP       And   LORazepam  (ATIVAN ) injection 2 mg  2 mg Intramuscular TID PRN Ajibola, Ene A, NP       feeding supplement (ENSURE ENLIVE / ENSURE PLUS) liquid 237 mL  237 mL Oral BID BM Ashyra Cantin, MD   237 mL at 08/01/23 1500   hydrOXYzine  (ATARAX ) tablet 25 mg  25 mg Oral TID PRN Ajibola, Ene A, NP   25 mg at 08/02/23 1242   lamoTRIgine  (LAMICTAL ) tablet 100 mg  100 mg Oral BID Ntuen, Tina C, FNP   100 mg at 08/02/23 1647   magnesium  hydroxide (MILK OF MAGNESIA) suspension 30 mL  30 mL Oral Daily PRN Ajibola, Ene A, NP       nicotine  polacrilex (NICORETTE ) gum 2 mg  2 mg Oral PRN Carrion-Carrero, Margely, MD   2 mg at 08/02/23 1640    propranolol  ER (INDERAL  LA) 24 hr capsule 80 mg  80 mg Oral Daily Ntuen, Tina C, FNP   80 mg at 08/01/23 0740   SUMAtriptan  (IMITREX ) tablet 25 mg  25 mg Oral Q2H PRN Ntuen, Tina C, FNP       traZODone  (DESYREL ) tablet 200 mg  200 mg Oral QHS PRN Ntuen, Tina C, FNP   200 mg at 08/01/23 2137    Lab Results:  No results found for this or any previous visit (from the past 48 hours).  Blood Alcohol level:  Lab Results  Component Value Date   [facility] <15 07/24/2023   ETH <10 08/20/2020   Metabolic Disorder Labs: Lab Results  Component Value Date   HGBA1C 4.7 (L) 07/25/2023   MPG 88.19 07/25/2023   No results found for: "PROLACTIN" Lab Results  Component Value Date   CHOL 230 (H) 02/17/2023   TRIG 250.0 (H) 02/17/2023   HDL 42.30 02/17/2023   CHOLHDL 5 02/17/2023   VLDL 50.0 (H) 02/17/2023   LDLCALC 138 (H) 02/17/2023   LDLCALC 130 (H) 06/12/2021   Musculoskeletal: Strength & Muscle Tone: within normal limits Gait & Station: normal Patient leans: N/A  Psychiatric Specialty Exam:  Presentation  General Appearance:  Appropriate for Environment; Casual; Fairly Groomed  Eye Contact: Fair  Speech: Clear and Coherent; Normal Rate  Speech Volume: Normal   Mood and Affect  Mood: "Good"  Affect: Appropriate; Full Range; Congruent  Thought Process  Thought Processes: Coherent; Goal Directed; Linear  Descriptions of Associations:Intact  Orientation:Full (Time, Place and Person)  Thought Content:Logical; WDL  History of Schizophrenia/Schizoaffective disorder:No  Hallucinations:No data recorded   Ideas of Reference:None  Suicidal Thoughts:No data recorded   Homicidal Thoughts:No data recorded   Sensorium  Memory: Immediate Fair  Judgment: Fair  Insight: Fair  Art therapist  Concentration: Good  Attention Span: Good  Recall: Good  Fund of Knowledge: Good  Language: Good  Psychomotor Activity  Psychomotor Activity: No data  recorded   Assets  Assets: Communication Skills; Desire for Improvement; Resilience  Sleep  Sleep: No data recorded   Physical Exam: Physical Exam Vitals and nursing note reviewed.  Constitutional:      Appearance: He is normal weight.  HENT:     Head: Normocephalic.     Right Ear: External ear normal.     Left Ear: External ear normal.     Nose: Nose normal.     Mouth/Throat:     Mouth: Mucous membranes are moist.     Pharynx: Oropharynx is clear.  Eyes:     Extraocular Movements: Extraocular movements intact.  Cardiovascular:     Rate and Rhythm: Normal rate.     Pulses: Normal pulses.  Pulmonary:     Effort: Pulmonary effort is normal.  Abdominal:     Comments: Deferred  Genitourinary:    Comments: Deferred Musculoskeletal:        General: Normal range of motion.     Cervical back: Normal range of motion.  Skin:    General: Skin is warm.  Neurological:     General: No focal deficit present.     Mental Status: He is alert and oriented to person, place, and time.  Psychiatric:        Mood and Affect: Mood normal.        Behavior: Behavior normal.    Review of Systems  Constitutional:  Negative for chills and fever.  HENT:  Negative for sore throat.   Eyes:  Negative for blurred vision.  Respiratory:  Negative for cough, sputum production, shortness of breath and wheezing.   Cardiovascular:  Negative for chest pain and palpitations.  Gastrointestinal:  Negative for heartburn and nausea.  Genitourinary:  Negative for dysuria, frequency and urgency.  Musculoskeletal:  Negative for falls.  Skin:  Negative for itching and rash.  Neurological:  Negative for dizziness, tingling and headaches.  Endo/Heme/Allergies:        See allergy listing  Psychiatric/Behavioral:  Negative for depression, hallucinations, substance abuse and suicidal ideas. The patient is not nervous/anxious and does not have insomnia.   All other systems reviewed and are negative.  Blood  pressure (!) 130/94, pulse 68, temperature 97.6 F (36.4 C), temperature source Oral, resp. rate 20, height 5\' 8"  (1.727 m), weight 67.1 kg, SpO2 98%. Body mass index is 22.5 kg/m.   Treatment Plan Summary: 5/6: patient is euthymic and pleasant. Denies SI, HI or AVH. Requesting discharge tomorrow and does not wish to go directly to inpatient rehab.    Daily contact with patient to assess and evaluate symptoms and progress in treatment and Medication management   Physician Treatment Plan for Primary Diagnosis: Bipolar disorder (HCC) Long Term Goal(s): Improvement in symptoms so as ready for discharge   Short Term Goals: Ability to identify changes in lifestyle to reduce recurrence of condition will improve, Ability to verbalize feelings will improve, Ability to disclose and discuss suicidal ideas, Ability to demonstrate self-control will improve, Ability to identify and develop effective coping behaviors will improve, Ability to maintain clinical measurements within normal limits will improve, Compliance with prescribed medications will improve, and Ability to identify triggers associated with substance abuse/mental health issues will improve   Physician Treatment Plan for Secondary Diagnosis: Assessment:  KIRIL CARPIO is a 38 y.o. Caucasian male with prior psychiatric history significant for bipolar 1 disorder depressed and anxiety who presents voluntarily to [facility] from [facility] ED for worsening depression resulting in suicidal ideation in the context of being in the process of divorce by  the wife of 17 years.   Principal Problem:   Bipolar disorder (HCC)   Plans:   Safety and Monitoring:  Voluntary admission to inpatient psychiatric unit for safety, stabilization and treatment  Daily contact with patient to assess and evaluate symptoms and progress in treatment  Patient's case to be discussed in multi-disciplinary team meeting  Observation Level : q15  minute checks  Vital signs: q12 hours  Precautions: suicide, but pt currently verbally contracts for safety on unit?    Medications: Continue home medications --  Continue amitriptyline  25 mg tablets p.o. at bedtime for depression -- Gabapentin  was discontinued on 07/29/2023 -- Continue Lamictal  100 mg tablets 1 p.o. 2 times daily for bipolar -- Continue trazodone  100 mg tablets at bedtime for sleep as needed   Medications for other medical problems: -- Propranolol  ER 80 mg 24-hour capsule p.o. daily for blood pressure -- Imitrex  25 mg tablets may repeat in 2 hours for a maximum of 100 mg every 24 hours for Tension headache   Other PRN Medications  -Acetaminophen  650 mg every 6 as needed/mild pain  -Maalox 30 mL oral every 4 as needed/digestion  -Magnesium  hydroxide 30 mL daily as needed/mild constipation    Admission lab reviewed: CMP: Potassium 3.2 low, glucose 132 high, BUN 5 low, alkaline phosphatase 37 low, total protein 6.1 low, otherwise normal.  Lipid profile: Cholesterol 230 high, LDL 138 high, triglyceride 250 high, VLDL 50.  Otherwise normal.  CBC with differential: Within normal limits.  UDS: Positive for cocaine metabolites, positive for benzodiazepines, positive for tricyclic urine screen.  TSH: 1.29, within normal limits Vitamin B12 within normal level, vitamin D  within normal level, hemoglobin A1c 4.7   EKG reviewed: Normal sinus rhythm, ventricular rate 63, QT/QTc 394/403   Continue BH Agitation Protocol  --Haldol  5 mg, oral, 3 times daily as needed, mild agitation  --Benadryl  50 mg, oral, 3 times daily as needed, mild agitation                                    OR   --Haldol  injection 5 mg, IM, 3 times daily as needed, moderate agitation  --Benadryl  injection 50 mg, IM, 3 times daily as needed, moderate agitation  --Ativan  injection 2 mg, IM, 3 times daily as needed, moderate agitation                                      OR  --Haldol  injection 10 mg, IM, 3 times  daily as needed, severe agitation  --Benadryl  injection 50 mg, IM, 3 times daily as needed, severe agitation  --Ativan  injection 2 mg, IM, 3 times daily as needed, severe agitation    --  The risks/benefits/side-effects/alternatives to this medication were discussed in detail with the patient and time was given for questions. The patient consents to medication trial.   -- Metabolic profile and EKG monitoring obtained while on an atypical antipsychotic (BMI: Lipid Panel: HbgA1c: QTc:)   -- Encouraged patient to participate in unit milieu and in scheduled group therapies      Signed: Jessyka Austria, MD 08/02/2023, 7:18 PM

## 2023-08-02 NOTE — Group Note (Signed)
 LCSW Group Therapy Note   Group Date: 08/01/2023 Start Time: 1100 End Time: 1200   Participation:  patient was present.  He shared personal experiences.  Type of Therapy:  Group Therapy  Topic:  Healing [facility]arts:  A Safe Space for Grief  Objective:  To create a compassionate group space where participants can process grief, learn about its stages, and explore personal rituals to honor lost loved ones.   Goals:  1. Provide a safe and supportive space where participants feel comfortable sharing their feelings and experiences of grief without judgment.  2. Educate participants about the stages of grief and emphasize that there is no "right" way to grieve or a fixed timeline for healing.  3. Introduce the concept of rituals to process grief, allowing individuals to honor their loved ones in a personal and meaningful way.  Summary:  In Healing He[facility]: A Safe Space for Grief, participants explored the unique and personal nature of grief, with a focus on the five stages (denial, anger, bargaining, depression, acceptance) as a flexible, non-linear process. The group introduced meaningful rituals - like lighting candles or memory walks - as tools for honoring loved ones and managing difficult emotions. Through shared experiences, participants received emotional support, practiced self-care, and reflected on gratitude, emphasizing that healing has no fixed timeline.  Therapeutic Modalities Used: Cognitive Behavioral Therapy (CBT):   Psychoeducation on grief stages, Challenging unhelpful thoughts (e.g., "I should be over this") Dialectical Behavior Therapy (DBT):  Mindfulness (staying present with grief-related emotions), Distress tolerance (using rituals as grounding tools) Group Therapy/Supportive Elements:  Emotional validation and peer support, Encouragement of open sharing in a nonjudgmental space   Amos Balint, LCSWA 08/02/2023  12:25 PM

## 2023-08-02 NOTE — Progress Notes (Addendum)
 Patient ID: Joel Carr, male   DOB: [birth year], 38 y.o.   MRN: [id number] CSW reached out to [facility] admissions and confirmed receipt of [facility]OS[facility] Consent form signed by pt for CSW / [facility] to confer with the [facility] admissions team. Per admissions, they did receive the most updated clinical information, sent by CSW on yesterday, and it remains under review. CSW team will continue to follow.   1330 CSW received return call from [facility] admissions stating that Pt has been approved for admit. The are requesting that Pt call to make appropriate arrangements. Spoke with Pt on the unit and notified him of reaching out to finalize time for admission. He is aware of anticipated discharge for tomorrow.  Sunni Richardson N Windi Toro, LCSW 08/02/23 1:13 PM

## 2023-08-02 NOTE — Plan of Care (Signed)
   Problem: Education: Goal: Knowledge of [facility] Education information/materials will improve Outcome: Progressing Goal: Emotional status will improve Outcome: Progressing Goal: Mental status will improve Outcome: Progressing

## 2023-08-02 NOTE — Progress Notes (Signed)
   08/01/23 2300  Psych Admission Type (Psych Patients Only)  Admission Status Voluntary  Psychosocial Assessment  Patient Complaints Anxiety;Depression (anxiety 5/10, depression 1/10)  Eye Contact Fair  Facial Expression Animated  Affect Appropriate to circumstance  Speech Logical/coherent  Interaction Assertive  Motor Activity Other (Comment) (WDL)  Appearance/Hygiene Unremarkable  Behavior Characteristics Appropriate to situation  Mood Anxious;Pleasant  Thought Process  Coherency WDL  Content WDL  Delusions None reported or observed  Perception WDL  Hallucination None reported or observed  Judgment Impaired  Confusion None  Danger to Self  Current suicidal ideation? Denies  Self-Injurious Behavior No self-injurious ideation or behavior indicators observed or expressed   Agreement Not to Harm Self Yes  Description of Agreement verbal  Danger to Others  Danger to Others None reported or observed

## 2023-08-02 NOTE — Plan of Care (Signed)
   Problem: Education: Goal: Emotional status will improve Outcome: Progressing Goal: Mental status will improve Outcome: Progressing   Problem: Activity: Goal: Interest or engagement in activities will improve Outcome: Progressing Goal: Sleeping patterns will improve Outcome: Progressing

## 2023-08-03 ENCOUNTER — Encounter (HOSPITAL_COMMUNITY): Payer: Self-pay

## 2023-08-03 DIAGNOSIS — F3175 Bipolar disorder, in partial remission, most recent episode depressed: Secondary | ICD-10-CM | POA: Diagnosis not present

## 2023-08-03 MED ORDER — NICOTINE POLACRILEX 2 MG MT GUM: 2.0000 mg | CHEWING_GUM | OROMUCOSAL | 0 refills | Status: DC | PRN

## 2023-08-03 MED ORDER — HYDROXYZINE HCL 25 MG PO TABS: 25.0000 mg | ORAL_TABLET | Freq: Three times a day (TID) | ORAL | 0 refills | Status: DC | PRN

## 2023-08-03 MED ORDER — AMITRIPTYLINE HCL 25 MG PO TABS
25.0000 mg | ORAL_TABLET | Freq: Every day | ORAL | 0 refills | Status: AC
Start: 2023-08-03 — End: ?

## 2023-08-03 MED ORDER — TRAZODONE HCL 100 MG PO TABS: 200.0000 mg | ORAL_TABLET | Freq: Every evening | ORAL | 0 refills | Status: DC | PRN

## 2023-08-03 MED ORDER — LAMOTRIGINE 100 MG PO TABS: 100.0000 mg | ORAL_TABLET | Freq: Two times a day (BID) | ORAL | 0 refills | Status: AC

## 2023-08-03 NOTE — BH IP Treatment Plan (Unsigned)
 Interdisciplinary Treatment and Diagnostic Plan Update  08/03/2023 Time of Session: 11:21 AM - UPDATE Joel Carr MRN: [id number]  Principal Diagnosis: Bipolar disorder (HCC)  Secondary Diagnoses: Principal Problem:   Bipolar disorder (HCC) Active Problems:   Bipolar and related disorder due to another medical condition with mixed features   Current Medications:  Current Facility-Administered Medications  Medication Dose Route Frequency Provider Last Rate Last Admin   acetaminophen  (TYLENOL ) tablet 650 mg  650 mg Oral Q6H PRN Ajibola, Ene A, NP   650 mg at 08/03/23 0642   alum & mag hydroxide-simeth (MAALOX/MYLANTA) 200-200-20 MG/5ML suspension 30 mL  30 mL Oral Q4H PRN Ajibola, Ene A, NP       amitriptyline  (ELAVIL ) tablet 25 mg  25 mg Oral QHS Ntuen, Tina C, FNP   25 mg at 08/02/23 2118   haloperidol  (HALDOL ) tablet 5 mg  5 mg Oral TID PRN Ajibola, Ene A, NP       And   diphenhydrAMINE  (BENADRYL ) capsule 50 mg  50 mg Oral TID PRN Ajibola, Ene A, NP       haloperidol  lactate (HALDOL ) injection 5 mg  5 mg Intramuscular TID PRN Ajibola, Ene A, NP       And   diphenhydrAMINE  (BENADRYL ) injection 50 mg  50 mg Intramuscular TID PRN Ajibola, Ene A, NP       And   LORazepam  (ATIVAN ) injection 2 mg  2 mg Intramuscular TID PRN Ajibola, Ene A, NP       haloperidol  lactate (HALDOL ) injection 10 mg  10 mg Intramuscular TID PRN Ajibola, Ene A, NP       And   diphenhydrAMINE  (BENADRYL ) injection 50 mg  50 mg Intramuscular TID PRN Ajibola, Ene A, NP       And   LORazepam  (ATIVAN ) injection 2 mg  2 mg Intramuscular TID PRN Ajibola, Ene A, NP       feeding supplement (ENSURE ENLIVE / ENSURE PLUS) liquid 237 mL  237 mL Oral BID BM Zouev, Dmitri, MD   237 mL at 08/01/23 1500   hydrOXYzine  (ATARAX ) tablet 25 mg  25 mg Oral TID PRN Ajibola, Ene A, NP   25 mg at 08/02/23 2116   lamoTRIgine  (LAMICTAL ) tablet 100 mg  100 mg Oral BID Ntuen, Tina C, FNP   100 mg at 08/03/23 0736   magnesium  hydroxide  (MILK OF MAGNESIA) suspension 30 mL  30 mL Oral Daily PRN Ajibola, Ene A, NP       nicotine  polacrilex (NICORETTE ) gum 2 mg  2 mg Oral PRN Carrion-Carrero, Margely, MD   2 mg at 08/03/23 1038   propranolol  ER (INDERAL  LA) 24 hr capsule 80 mg  80 mg Oral Daily Ntuen, Tina C, FNP   80 mg at 08/03/23 0736   SUMAtriptan  (IMITREX ) tablet 25 mg  25 mg Oral Q2H PRN Ntuen, Tina C, FNP       traZODone  (DESYREL ) tablet 200 mg  200 mg Oral QHS PRN Ntuen, Tina C, FNP   200 mg at 08/02/23 2116   PTA Medications: Medications Prior to Admission  Medication Sig Dispense Refill Last Dose/Taking   amitriptyline  (ELAVIL ) 25 MG tablet Take 25 mg by mouth at bedtime.   07/25/2023   gabapentin  (NEURONTIN ) 300 MG capsule Take 1 capsule (300 mg total) by mouth at bedtime. 30 capsule 3 07/25/2023   lamoTRIgine  (LAMICTAL ) 100 MG tablet Take 1 tablet (100 mg total) by mouth 2 (two) times daily. 180 tablet 0 07/25/2023  propranolol  ER (INDERAL  LA) 80 MG 24 hr capsule Take 1 capsule (80 mg total) by mouth daily. 90 capsule 3 07/25/2023   SUMAtriptan  (IMITREX ) 25 MG tablet Take 1 tablet by mouth at onset of headache. May repeat in 2 hours if headache persists or recurs. Max 100 mg/24 hours. (Patient taking differently: Take 25 mg by mouth every 2 (two) hours as needed for migraine or headache. Take 1 tablet by mouth at onset of headache. May repeat in 2 hours if headache persists or recurs. Max 100 mg/24 hours.) 10 tablet 5 07/25/2023   traZODone  (DESYREL ) 100 MG tablet Take 200 mg by mouth at bedtime as needed for sleep.   07/25/2023    Patient Stressors:    Patient Strengths:    Treatment Modalities: Medication Management, Group therapy, Case management,  1 to 1 session with clinician, Psychoeducation, Recreational therapy.   Physician Treatment Plan for Primary Diagnosis: Bipolar disorder (HCC) Long Term Goal(s): Improvement in symptoms so as ready for discharge   Short Term Goals: Ability to identify changes in  lifestyle to reduce recurrence of condition will improve Ability to verbalize feelings will improve Ability to disclose and discuss suicidal ideas Ability to demonstrate self-control will improve Ability to identify and develop effective coping behaviors will improve Ability to maintain clinical measurements within normal limits will improve Compliance with prescribed medications will improve Ability to identify triggers associated with substance abuse/mental health issues will improve  Medication Management: Evaluate patient's response, side effects, and tolerance of medication regimen.  Therapeutic Interventions: 1 to 1 sessions, Unit Group sessions and Medication administration.  Evaluation of Outcomes: {BHH Tx Plan Outcomes:[id number]}  Physician Treatment Plan for Secondary Diagnosis: Principal Problem:   Bipolar disorder (HCC) Active Problems:   Bipolar and related disorder due to another medical condition with mixed features  Long Term Goal(s): Improvement in symptoms so as ready for discharge   Short Term Goals: Ability to identify changes in lifestyle to reduce recurrence of condition will improve Ability to verbalize feelings will improve Ability to disclose and discuss suicidal ideas Ability to demonstrate self-control will improve Ability to identify and develop effective coping behaviors will improve Ability to maintain clinical measurements within normal limits will improve Compliance with prescribed medications will improve Ability to identify triggers associated with substance abuse/mental health issues will improve     Medication Management: Evaluate patient's response, side effects, and tolerance of medication regimen.  Therapeutic Interventions: 1 to 1 sessions, Unit Group sessions and Medication administration.  Evaluation of Outcomes: Adequate for Discharge   RN Treatment Plan for Primary Diagnosis: Bipolar disorder (HCC) Long Term Goal(s): Knowledge of  disease and therapeutic regimen to maintain health will improve  Short Term Goals: Compliance with prescribed medications will improve  Medication Management: RN will administer medications as ordered by provider, will assess and evaluate patient's response and provide education to patient for prescribed medication. RN will report any adverse and/or side effects to prescribing provider.  Therapeutic Interventions: 1 on 1 counseling sessions, Psychoeducation, Medication administration, Evaluate responses to treatment, Monitor vital signs and CBGs as ordered, Perform/monitor CIWA, COWS, AIMS and Fall Risk screenings as ordered, Perform wound care treatments as ordered.  Evaluation of Outcomes: Adequate for Discharge   LCSW Treatment Plan for Primary Diagnosis: Bipolar disorder [facility]) Long Term Goal(s): Safe transition to appropriate next level of care at discharge, Engage patient in therapeutic group addressing interpersonal concerns.  Short Term Goals: Engage patient in aftercare planning with referrals and resources and Increase skills for  wellness and recovery  Therapeutic Interventions: Assess for all discharge needs, 1 to 1 time with Social worker, Explore available resources and support systems, Assess for adequacy in community support network, Educate family and significant other(s) on suicide prevention, Complete Psychosocial Assessment, Interpersonal group therapy.  Evaluation of Outcomes: Adequate for Discharge   Progress in Treatment: Attending groups: Yes Participating in group:  Yes. Taking medication as prescribed: Yes. Toleration medication: Yes. Family/Significant other contact made:  Yes, contacted  Keziah Mckinnies (wife) [phone] Patient understands diagnosis: Yes. Discussing patient identified problems/goals with staff: Yes. Medical problems stabilized or resolved: Yes. Denies suicidal/homicidal ideation: Yes. Issues/concerns per patient self-inventory: No.   New  problem(s) identified:  No   New Short Term/Long Term Goal(s): medication stabilization, elimination of SI thoughts, development of comprehensive mental wellness plan.    Patient Goals:  "I'm dealing with grief because my wife and I are getting separated."   Discharge Plan or Barriers:  Patient recently admitted. CSW will continue to follow and assess for appropriate referrals and possible discharge planning.      Reason for Continuation of Hospitalization: Anxiety Medication stabilization Suicidal ideation   Estimated Length of Stay:  D/C Last 3 [place] Suicide Severity Risk Score: Flowsheet Row [facility] from 07/24/2023 in [facility] Admission (Discharged) from 01/13/2023 in [facility] PERI[facility]  C-SSRS RISK CATEGORY High Risk High Risk No Risk       Last PHQ 2/9 Scores:    07/05/2023    3:09 PM 05/23/2023    8:42 AM 05/11/2023    1:40 PM  Depression screen PHQ 2/9  Decreased Interest 0 0 0  Down, Depressed, Hopeless 0 0 0  PHQ - 2 Score 0 0 0    Scribe for Treatment Team: Ja Pistole N Karnisha Lefebre, LCSW 08/03/2023 11:21 AM

## 2023-08-03 NOTE — Progress Notes (Signed)
  [facility] Adult Case Management Discharge Plan :  Will you be returning to the same living situation after discharge: No. Patient will be picked up by his wife and transported to [facility] for substance treatment. At discharge, do you have transportation home?: Yes,  Joel Carr (wife) will pick patient up. Do you have the ability to pay for your medications: Yes,  patient reports the ability to pay for medications.   Release of information consent forms completed and in the chart;  Patient's signature needed at discharge.  Patient to Follow up at:  Follow-up Information     [facility] [address], [place]. Go on 08/04/2023.   Why: You have an appointment for medication management services on 08/04/23 at 1:40 pm, Virtual.  * You must call to confirm or cancel this appointment. Contact information: [address] [place], [place] [address]  Phone: [phone] Fax:  [phone]        [facility] Follow up on 08/03/2023.   Why: You have an appointment with Simon Dubin, [facility] for therapy services on 08/03/23 at 2:00 pm .  This will be a Virtual appointment. Contact information: [address] [place], [place] [address]  P: [phone] F: [phone]                Next level of care provider has access to [facility] Link:no  Safety Planning and Suicide Prevention discussed: Yes,  safety planning and suicide prevention discussed with Joel Carr (wife) [phone].     Has patient been referred to the Quitline?: Patient refused referral for treatment  Patient has been referred for addiction treatment: Patient will present to [facility] for substance abuse treatment located at [address] [place], [place] [address].  Joel Carr M Joel Carr, LCSWA 08/03/2023, 9:47 AM

## 2023-08-03 NOTE — Progress Notes (Signed)
 Patient ID: Joel Carr, male   DOB: 22-May-1985, 38 y.o.   MRN: [id number] Discharge instructions, medications and follow up appointments reviewed, patient verbalized understanding. Patient did not any belongings in locker, pt discharged home.

## 2023-08-03 NOTE — Group Note (Signed)
 Date:  08/03/2023 Time:  11:00 AM  Group Topic/Focus:  Emotional Education:   The focus of this group is to discuss what feelings/emotions are, and how they are experienced. Goals Group:   The focus of this group is to help patients establish daily goals to achieve during treatment and discuss how the patient can incorporate goal setting into their daily lives to aide in recovery. Orientation:   The focus of this group is to educate the patient on the purpose and policies of crisis stabilization and provide a format to answer questions about their admission.  The group details unit policies and expectations of patients while admitted.    Participation Level:  Active  Participation Quality:  Appropriate  Affect:  Appropriate  Cognitive:  Appropriate  Insight: Appropriate  Engagement in Group:  Engaged  Modes of Intervention:  Discussion  Additional Comments:  Pt shared goals with group.   Joel Carr 08/03/2023, 11:00 AM

## 2023-08-03 NOTE — BHH Suicide Risk Assessment (Signed)
 [facility] Discharge Suicide Risk Assessment   Principal Problem: Bipolar disorder [facility]) Discharge Diagnoses: Principal Problem:   Bipolar disorder (HCC) Active Problems:   Bipolar and related disorder due to another medical condition with mixed features   Total Time spent with patient: 30 minutes  Musculoskeletal: Strength & Muscle Tone: within normal limits Gait & Station: normal Patient leans: N/A  Psychiatric Specialty Exam  Presentation  General Appearance:  Appropriate for Environment; Casual; Fairly Groomed  Eye Contact: Fair  Speech: Clear and Coherent; Normal Rate  Speech Volume: Normal  Handedness: Right   Mood and Affect  Mood: Euthymic  Duration of Depression Symptoms: Less than two weeks  Affect: Appropriate; Full Range; Congruent   Thought Process  Thought Processes: Coherent; Goal Directed; Linear  Descriptions of Associations:Intact  Orientation:Full (Time, Place and Person)  Thought Content:Logical; WDL  History of Schizophrenia/Schizoaffective disorder:No  Duration of Psychotic Symptoms:No data recorded Hallucinations:No data recorded Ideas of Reference:None  Suicidal Thoughts:No data recorded Homicidal Thoughts:No data recorded  Sensorium  Memory: Immediate Fair  Judgment: Fair  Insight: Fair   Executive Functions  Concentration: Good  Attention Span: Good  Recall: Good  Fund of Knowledge: Good  Language: Good   Psychomotor Activity  Psychomotor Activity:No data recorded  Assets  Assets: Communication Skills; Desire for Improvement; Resilience   Sleep  Sleep:No data recorded  Physical Exam: Physical Exam ROS Blood pressure (!) 119/96, pulse 74, temperature 97.6 F (36.4 C), temperature source Oral, resp. rate 20, height 5\' 8"  (1.727 m), weight 67.1 kg, SpO2 99%. Body mass index is 22.5 kg/m.  Mental Status Per Nursing Assessment::   On Admission:  Suicidal ideation indicated by  patient  Demographic Factors:  Male, Caucasian, and Low socioeconomic status  Loss Factors: Decrease in vocational status  Historical Factors: Family history of mental illness or substance abuse  Risk Reduction Factors:   Responsible for children under 59 years of age, Sense of responsibility to family, Living with another person, especially a relative, and Positive coping skills or problem solving skills  Continued Clinical Symptoms:  More than one psychiatric diagnosis Previous Psychiatric Diagnoses and Treatments  Cognitive Features That Contribute To Risk:  None    Suicide Risk:  Minimal: No identifiable suicidal ideation.  Patients presenting with no risk factors but with morbid ruminations; may be classified as minimal risk based on the severity of the depressive symptoms  Patient denies SI or HI for >48 hours. Denies wanting to be dead and future oriented. SRA complete and acute risk for suicide is low.   [place] Suicide Risk assessment:  1. Do you wish to be dead? NONE REPORTED 2. Have you wished your dead or wished you could go to sleep and not wake up? NONE REPORTED 3.  Have you actually had thoughts of killing yourself?  NONE REPORTED 4.  Have you been thinking about how you might do this?  NONE REPORTED 5.  Have you had these thoughts and some intention of acting on them? NONE REPORTED 6.  Have you started to work out or worked out the details to kill yourself? NONE REPORTED 7.  Do you intend to carry out this plan? NONE REPORTED 8. On a scale of 1-5 with 1 being the least severe and 5 being the most severe answer the following questions place for intensity of ideation. ZERO 9. How many times have you had these thoughts? NONE REPORTED 10. When you have the thoughts how long to the last?  NONE REPORTED 11. Control ability.  Could you or can you stop thinking about killing herself or wanting to die if you want to?  YES 12. Are there any things anyone or anything  family religion pain of death that stop you from wanting to die or acting on thoughts of committing suicide?  FAMILY 13.  What sort of reason to do have to think about wanting to die or killing yourself? NONE REPORTED 14.Was it to end the pain or stop the way you are feeling in other words you could not go on living with his pain or how you are feeling or was not to get attention revenge or reaction from others?  Or both?  NONE REPORTED  [place] Suicide Risk assessment:  1. Do you wish to be dead? NONE REPORTED 2. Have you wished your dead or wished you could go to sleep and not wake up? NONE REPORTED 3.  Have you actually had thoughts of killing yourself?  NONE REPORTED 4.  Have you been thinking about how you might do this?  NONE REPORTED 5.  Have you had these thoughts and some intention of acting on them? NONE REPORTED 6.  Have you started to work out or worked out the details to kill yourself? NONE REPORTED 7.  Do you intend to carry out this plan? NONE REPORTED 8. On a scale of 1-5 with 1 being the least severe and 5 being the most severe answer the following questions place for intensity of ideation. ZERO 9. How many times have you had these thoughts? NONE REPORTED 10. When you have the thoughts how long to the last?  NONE REPORTED 11. Control ability.  Could you or can you stop thinking about killing herself or wanting to die if you want to?  YES 12. Are there any things anyone or anything family religion pain of death that stop you from wanting to die or acting on thoughts of committing suicide?  FAMILY 13.  What sort of reason to do have to think about wanting to die or killing yourself? NONE REPORTED 14.Was it to end the pain or stop the way you are feeling in other words you could not go on living with his pain or how you are feeling or was not to get attention revenge or reaction from others?  Or both?  NONE REPORTED    Follow-up Information     [facility], [facility]. Go on 08/04/2023.   Why: You have an appointment for medication management services on 08/04/23 at 1:40 pm, Virtual.  * You must call to confirm or cancel this appointment. Contact information: [address], [place], [place] [address]  Phone: [phone] Fax:  [phone]        [facility] Follow up on 08/03/2023.   Why: You have an appointment with Simon Dubin, [facility] for therapy services on 08/03/23 at 2:00 pm .  This will be a Virtual appointment. Contact information: [address] [place], [place] [address]  P: [phone] F: [phone]                Plan Of Care/Follow-up recommendations:  Other:  outpatient psychiatric followup as above  Bishop Vanderwerf, MD 08/03/2023, 12:04 PM

## 2023-08-03 NOTE — Plan of Care (Signed)
  Problem: Education: Goal: Knowledge of [place] [facility] Education information/materials will improve Outcome: Completed/Met Goal: Emotional status will improve Outcome: Completed/Met Goal: Mental status will improve 08/03/2023 1131 by Media Spikes, RN Outcome: Completed/Met 08/03/2023 0815 by Media Spikes, RN Outcome: Adequate for Discharge Goal: Verbalization of understanding the information provided will improve Outcome: Completed/Met   Problem: Activity: Goal: Interest or engagement in activities will improve Outcome: Completed/Met Goal: Sleeping patterns will improve Outcome: Completed/Met   Problem: Coping: Goal: Ability to verbalize frustrations and anger appropriately will improve Outcome: Completed/Met Goal: Ability to demonstrate self-control will improve Outcome: Completed/Met   Problem: Health Behavior/Discharge Planning: Goal: Identification of resources available to assist in meeting health care needs will improve Outcome: Completed/Met Goal: Compliance with treatment plan for underlying cause of condition will improve Outcome: Completed/Met

## 2023-08-03 NOTE — Discharge Summary (Signed)
 Physician Discharge Summary Note  Patient:  Joel Carr is an 38 y.o., male MRN:  [id number] DOB:  [birth year] Patient phone:  [phone] (home)  Patient address:   [address] [place] [address],  Total Time spent with patient: 30 minutes  Date of Admission:  07/25/2023 Date of Discharge: 08/03/23  Reason for Admission:  as per H&P: " 38 y.o. Caucasian male with prior psychiatric history significant for bipolar 1 disorder depressed and anxiety who presents voluntarily to [facility] from [facility] ED for worsening depression resulting in suicidal ideation in the context of being in the process of divorce by the wife of 17 years. After medical evaluation / stabilization & clearance, he was transferred to the [facility] for further psychiatric evaluation & treatments.    During this evaluation, patient report saying that he would rather die than getting a divorce from his wife. He reports a previous psychiatric hospitalization due to suicidal ideations but states that his symptoms today are worse.  Patient reports that he does not have suicidal plan or intent however, was only shocked by his wife plans to divorce him.  Per his record, patient has a medical history of GERD, chronic pain, arthritis, hyperlipidemia, restless leg syndrome, asthma and migraines.  He reports a psychiatric history of anxiety, insomnia, bipolar disorder with last manic symptoms 1 year ago.  He reports being established with a psychiatric provider, [facility] [place]  for the past couple of years.  Patient reports being diagnosed with bipolar disorder about 17 to 19 years ago.  He reports being prescribed Lamictal , gabapentin , valuim PRN, amitriptyline , and trazodone  but unsure of dosages.  He reports medication compliance and denies adverse medication effects.  Patient reports also being established with a therapist and states that he has his next  appointment today, however, unable to obtain due to hospitalization.   He reports sleeping approximately 8 hours over the past week due to being anxious with the divorce issue.  Patient reports drinking alcohol, stating that he drinks 1 to 2 corona per day.  Patient reports last beer was yesterday.  He denies illicit substance use although, UDS is positive for cocaine metabolite, benzos, patient is on Valium as needed and tricyclic, patient is on amitriptyline . Patient denies suicidal ideation, homicidal ideations, paranoia, delusional thought, auditory hallucinations / visual hallucination. Patient reports a history of marijuana use but no recent use."  Principal Problem: Bipolar disorder [facility]) Discharge Diagnoses: Principal Problem:   Bipolar disorder (HCC) Active Problems:   Bipolar and related disorder due to another medical condition with mixed features   Past Psychiatric History:  Information collected from patient, ED treatment Prev Dx/Sx: bipolar disorder Current Psych Provider: Yes, see above Home Meds (current): Lamictal .  See above Previous Med Trials: denies Therapy: yes see above Prior Psych Hospitalization: yes. One prior psych hospitalization due to SI Prior Self Harm: denies Prior Violence: denies  Past Medical History:  Past Medical History:  Diagnosis Date   Anxiety    Bipolar disorder (HCC)    Chronic pain syndrome    COVID-19 virus infection 12/23/2019   mild congestion and body aches   Depression    History of kidney stones    History of migraine    Hyperlipidemia    Pneumonia    RLS (restless legs syndrome)     Past Surgical History:  Procedure Laterality Date   ARTHRODESIS METATARSAL Left 09/19/2020   Procedure: ARTHRODESIS METATARSAL; LISFRANC MULTIPLE-1/2/3;  Surgeon: Pink Bridges, DPM;  Location: [facility] ORS;  Service: Podiatry;  Laterality: Left;   ARTHRODESIS METATARSAL Left 01/13/2023   Procedure: ARTHRODESIS; LISFRANC; MULTIPLE FOURTH AND FIFTH;   Surgeon: Pink Bridges, DPM;  Location: [facility];  Service: Orthopedics/Podiatry;  Laterality: Left;   FOOT ARTHRODESIS Left 09/19/2020   Procedure: ARTHRODESIS FOOT- *POSSIBLE BONE GRAFT;  Surgeon: Pink Bridges, DPM;  Location: [facility] ORS;  Service: Podiatry;  Laterality: Left;   FOOT ARTHRODESIS Left 01/13/2023   Procedure: AUTOGRAFT HARVEST;  Surgeon: Pink Bridges, DPM;  Location: [facility];  Service: Orthopedics/Podiatry;  Laterality: Left;   KNEE ARTHROSCOPY WITH MENISCAL REPAIR Right 10/29/2014   Procedure: KNEE ARTHROSCOPY WITH MENISCAL REPAIR;  Surgeon: Molli Angelucci, MD;  Location: [facility] ORS;  Service: Orthopedics;  Laterality: Right;   OPEN REDUCTION INTERNAL FIXATION (ORIF) FOOT LISFRANC FRACTURE Left 09/19/2020   Procedure: OPEN REDUCTION INTERNAL FIXATION (ORIF) FOOT LISFRANC FRACTURE x2 4/5  PERCUTANEOUS SKELETAL FIXATION OF TARSOMETATARSAL JOINT DISLOCATION WITH MANIPULATION, X2 4/5;  Surgeon: Pink Bridges, DPM;  Location: [facility] ORS;  Service: Podiatry;  Laterality: Left;   TESTICLE SURGERY N/A    as a child   TYMPANOPLASTY Bilateral 1990   Family History:  Family History  Problem Relation Age of Onset   Depression Father    Alcohol abuse Father    Diabetes Paternal Grandfather    Heart attack Paternal Grandfather    Colon cancer Neg Hx    Family Psychiatric  History:  Medical: Unknown Psych: Maternal great uncle, 3 cousins on the maternal side of the family has history of suicide Psych Rx: Unknown SA/HA: Unknown Substance use family hx: Maternal side of the family have history of alcoholics  Social History:  Social History   Substance and Sexual Activity  Alcohol Use Not Currently   Comment: rarely     Social History   Substance and Sexual Activity  Drug Use No    Social History   Socioeconomic History   Marital status: Married    Spouse name: True Fuss   Number of children: 2   Years of education: 10-   Highest education level: GED or  equivalent  Occupational History   Not on file  Tobacco Use   Smoking status: Every Day    Types: Cigars, E-cigarettes   Smokeless tobacco: Never   Tobacco comments:    3 cig daily-04/29/2020  Vaping Use   Vaping status: Every Day   Substances: Nicotine , Flavoring  Substance and Sexual Activity   Alcohol use: Not Currently    Comment: rarely   Drug use: No   Sexual activity: Yes  Other Topics Concern   Not on file  Social History Narrative   Married with 2 kids. 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, prison and probation services   Social Drivers of Corporate investment banker Strain: Medium Risk (02/14/2023)   Overall Financial Resource Strain (CARDIA)    Difficulty of Paying Living Expenses: Somewhat hard  Food Insecurity: Food Insecurity Present (07/25/2023)   Hunger Vital Sign    Worried About Running Out of Food in the Last Year: Sometimes true    Ran Out of Food in the Last Year: Sometimes true  Transportation Needs: Patient Declined (07/25/2023)   PRAPARE - Transportation    Lack of Transportation (Medical): Patient declined    Lack of Transportation (Non-Medical): Patient declined  Physical Activity: Sufficiently Active (02/14/2023)   Exercise Vital Sign    Days of Exercise per Week: 7 days  Minutes of Exercise per Session: 30 min  Stress: No Stress Concern Present (02/14/2023)   [employer] of Occupational Health - Occupational Stress Questionnaire    Feeling of Stress : Only a little  Social Connections: Moderately Isolated (02/14/2023)   Social Connection and Isolation Panel [NHANES]    Frequency of Communication with Friends and Family: More than three times a week    Frequency of Social Gatherings with Friends and Family: Three times a week    Attends Religious Services: Never    Active Member of Clubs or Organizations: No    Attends Banker Meetings: Not on file    Marital Status: Married    Hospital Course:    Patient was  admitted to inpatient psychiatry at [facility] for safety and stabilization. Patient was provided safe and therapeutic milieu, psychiatric and medical assessment, care and treatment, as well as support from nursing, behavioral health staff. Both psychotherapy and psychoeducation groups were provided. Different coping skills such as journaling, CBT and art therapy groups were offered. Additional consultation was provided by hospitalist for H&P and medical needs.  Patient was started on lamotrigine  during the admission for bipolar depression. Patient tolerated without side effects and medications were titrated to therapeutic effect. As patient stabilized on medications and participated in therapeutic interventions, symptoms began to improve.  Patient was smiling, euthymic, pleasant and interacting with peers as well as attending groups for 2 days prior to discharge. Patient denies SI or HI for >48 hours prior to discharge.  On the day of discharge, the chart was reviewed, case was discussed with staff and the patient was seen in person. Patient's overall mood has improved and reports feeling "good." Asking to be discharged today and aware of [facility] inpatient rehab referral which he plans to go to.  Patient was calm and cooperative and did not appear anxious. Patient reported adequate sleep and stable mood. Patient was tolerating medications well without side effects reported or noted. Patient denied suicidal ideation, plan or intent, denied hopelessness, helplessness or worthlessness, and denied homicidal ideation. Insight and judgement have improved. Patient demonstrated future orientation and was motivated to follow-up with aftercare. Patient was encouraged to be adherent with medications. Patient was instructed to call 911, ask for help to go to the closest emergency room or crisis center, call crisis hotlines for help if in critical status or when symptoms were worsening. Patient voiced understanding of this  information. At the time of discharge, patient had reached maximum benefit from hospitalization, was no longer considered to be dangerous to self or others, and was psychiatrically stable and otherwise appropriate for discharge to less restrictive care in the community.   Medical Hospital Course: Patient was seen by the hospitalist for routine admission examination. Medications for chronic conditions were continued.  Medical hospital course was otherwise unremarkable.    Musculoskeletal: Strength & Muscle Tone: within normal limits Gait & Station: normal Patient leans: N/A   Psychiatric Specialty Exam:  Presentation  General Appearance:  Appropriate for Environment; Casual; Fairly Groomed  Eye Contact: Fair  Speech: Clear and Coherent; Normal Rate  Speech Volume: Normal  Handedness: Right   Mood and Affect  Mood: Euthymic  Affect: Appropriate; Full Range; Congruent   Thought Process  Thought Processes: Coherent; Goal Directed; Linear  Descriptions of Associations:Intact  Orientation:Full (Time, Place and Person)  Thought Content:Logical; WDL  History of Schizophrenia/Schizoaffective disorder:No  Duration of Psychotic Symptoms:No data recorded Hallucinations:No data recorded Ideas of Reference:None  Suicidal Thoughts:No data recorded Homicidal  Thoughts:No data recorded  Sensorium  Memory: Immediate Fair  Judgment: Fair  Insight: Fair   Executive Functions  Concentration: Good  Attention Span: Good  Recall: Good  Fund of Knowledge: Good  Language: Good   Psychomotor Activity  Psychomotor Activity:No data recorded  Assets  Assets: Communication Skills; Desire for Improvement; Resilience   Sleep  Sleep:fair   Physical Exam: Physical exam: Please see exam on admit note. General: Well developed, well nourished.  Pupils: Normal at 3mm Respiratory: Breathing is unlabored.  Cardiovascular: No edema.  Language: No anomia, no  aphasia Muscle strength and tone-pt moving all extremities.  Gait not assessed as pt remained in bed.  Neuro: Facial muscles are symmetric. Pt without tremor, no evidence of hyperarousal.  Review of Systems  Constitutional: Negative.   HENT: Negative.    Eyes: Negative.   Respiratory: Negative.    Cardiovascular: Negative.   Gastrointestinal: Negative.   Genitourinary: Negative.   Musculoskeletal: Negative.   Skin: Negative.   Neurological: Negative.   Endo/Heme/Allergies: Negative.   Psychiatric/Behavioral: Negative.     Blood pressure (!) 119/96, pulse 74, temperature 97.6 F (36.4 C), temperature source Oral, resp. rate 20, height 5\' 8"  (1.727 m), weight 67.1 kg, SpO2 99%. Body mass index is 22.5 kg/m.   Social History   Tobacco Use  Smoking Status Every Day   Types: Cigars, E-cigarettes  Smokeless Tobacco Never  Tobacco Comments   3 cig daily-04/29/2020   Tobacco Cessation:  A prescription for an FDA-approved tobacco cessation medication provided at discharge   Blood Alcohol level:  Lab Results  Component Value Date   [facility] <15 07/24/2023   ETH <10 08/20/2020    Metabolic Disorder Labs:  Lab Results  Component Value Date   HGBA1C 4.7 (L) 07/25/2023   MPG 88.19 07/25/2023   No results found for: "PROLACTIN" Lab Results  Component Value Date   CHOL 230 (H) 02/17/2023   TRIG 250.0 (H) 02/17/2023   HDL 42.30 02/17/2023   CHOLHDL 5 02/17/2023   VLDL 50.0 (H) 02/17/2023   LDLCALC 138 (H) 02/17/2023   LDLCALC 130 (H) 06/12/2021    See Psychiatric Specialty Exam and Suicide Risk Assessment completed by Attending Physician prior to discharge.  Discharge destination:  Home  Is patient on multiple antipsychotic therapies at discharge:  No   Has Patient had three or more failed trials of antipsychotic monotherapy by history:  No  Recommended Plan for Multiple Antipsychotic Therapies: NA   Allergies as of 08/03/2023       Reactions   Tramadol  Other (See  Comments)   Jittery Other Reaction(s): confusion        Medication List     STOP taking these medications    gabapentin  300 MG capsule Commonly known as: NEURONTIN        TAKE these medications      Indication  amitriptyline  25 MG tablet Commonly known as: ELAVIL  Take 1 tablet (25 mg total) by mouth at bedtime.  Indication: Depression   hydrOXYzine  25 MG tablet Commonly known as: ATARAX  Take 1 tablet (25 mg total) by mouth 3 (three) times daily as needed for anxiety.  Indication: Feeling Anxious   lamoTRIgine  100 MG tablet Commonly known as: LAMICTAL  Take 1 tablet (100 mg total) by mouth 2 (two) times daily.  Indication: Manic-Depression   nicotine  polacrilex 2 MG gum Commonly known as: NICORETTE  Take 1 each (2 mg total) by mouth as needed for smoking cessation.  Indication: Nicotine  Addiction   propranolol  ER 80  MG 24 hr capsule Commonly known as: INDERAL  LA Take 1 capsule (80 mg total) by mouth daily.  Indication: per primary care physician   SUMAtriptan  25 MG tablet Commonly known as: Imitrex  Take 1 tablet by mouth at onset of headache. May repeat in 2 hours if headache persists or recurs. Max 100 mg/24 hours. What changed:  how much to take how to take this when to take this reasons to take this  Indication: Migraine Headache   traZODone  100 MG tablet Commonly known as: DESYREL  Take 2 tablets (200 mg total) by mouth at bedtime as needed for sleep.  Indication: Trouble Sleeping        Follow-up Information     [facility]. Go on 08/04/2023.   Why: You have an appointment for medication management services on 08/04/23 at 1:40 pm, Virtual.  * You must call to confirm or cancel this appointment. Contact information: [address], [place], [place] [address]  Phone: [phone] Fax:  [phone]        [facility] Follow up on 08/03/2023.   Why: You have an appointment with Simon Dubin, [facility] for  therapy services on 08/03/23 at 2:00 pm .  This will be a Virtual appointment. Contact information: [address] [place], [place] [address]  P: [phone] F: [phone]                Follow-up recommendations:  Other:  [facility] inpatient rehab referral in place, patient does not wish to wait for a bed to go directly there and asking to be discharged today, does not meet IVC criteria; outpatient psychiatric followup as above    Signed: Lamoine Fredricksen, MD 08/03/2023, 12:23 PM

## 2023-08-03 NOTE — Progress Notes (Signed)
 Social worker spoke to patient reminding him of discharging today at 1pm. Patient was under the impression that he'd be leaving sooner. Explained to patient we were waiting on discharge paperwork from the provider which could take a few hours. Patient understood and requested I call his wife, True Fuss, to inform her of pickup time. Social worker called True Fuss who stated she'd be here at 1pm to pick up patient. Informed patient of this.   Karlisha Mathena, LCSWA

## 2023-08-03 NOTE — Plan of Care (Signed)
  Problem: Education: Goal: Knowledge of [place] [facility] Education information/materials will improve Outcome: Completed/Met Goal: Emotional status will improve Outcome: Completed/Met Goal: Mental status will improve Outcome: Adequate for Discharge Goal: Verbalization of understanding the information provided will improve Outcome: Completed/Met

## 2023-08-08 ENCOUNTER — Telehealth: Payer: Self-pay

## 2023-08-08 NOTE — Telephone Encounter (Signed)
Pt informed

## 2023-08-08 NOTE — Telephone Encounter (Signed)
 Copied from CRM [id number]. Topic: General - Other >> Aug 08, 2023  1:54 PM Adonis Hoot wrote: Reason for CRM: Patient called in stating that he is trying to get admitted into a rehab program in [place] called [facility]. He stated that they are needing his PCP provider to reach out to them to let her know what they  would need to have him admitted into the program.  [phone]

## 2023-09-29 ENCOUNTER — Other Ambulatory Visit: Payer: Self-pay

## 2024-02-17 ENCOUNTER — Encounter: Payer: Self-pay | Admitting: Nurse Practitioner

## 2024-02-17 ENCOUNTER — Ambulatory Visit: Payer: 59 | Admitting: Nurse Practitioner

## 2024-02-17 VITALS — BP 132/80 | HR 81 | Temp 97.9°F | Ht 68.0 in | Wt 160.0 lb

## 2024-02-17 DIAGNOSIS — Z87898 Personal history of other specified conditions: Secondary | ICD-10-CM

## 2024-02-17 DIAGNOSIS — F3131 Bipolar disorder, current episode depressed, mild: Secondary | ICD-10-CM

## 2024-02-17 DIAGNOSIS — R519 Headache, unspecified: Secondary | ICD-10-CM

## 2024-02-17 DIAGNOSIS — Z0001 Encounter for general adult medical examination with abnormal findings: Secondary | ICD-10-CM

## 2024-02-17 DIAGNOSIS — E782 Mixed hyperlipidemia: Secondary | ICD-10-CM

## 2024-02-17 DIAGNOSIS — Z23 Encounter for immunization: Secondary | ICD-10-CM

## 2024-02-17 DIAGNOSIS — H9313 Tinnitus, bilateral: Secondary | ICD-10-CM | POA: Insufficient documentation

## 2024-02-17 LAB — LIPID PANEL
Cholesterol: 265 mg/dL — ABNORMAL HIGH (ref 0–200)
HDL: 49.3 mg/dL (ref >39.00)
LDL Cholesterol: 167 mg/dL — ABNORMAL HIGH (ref 0–99)
NonHDL: 216.01
Total CHOL/HDL Ratio: 5
Triglycerides: 247 mg/dL — ABNORMAL HIGH (ref 0.0–149.0)
VLDL: 49.4 mg/dL — ABNORMAL HIGH (ref 0.0–40.0)

## 2024-02-17 LAB — CBC WITH DIFFERENTIAL/PLATELET
Basophils Absolute: 0 K/uL (ref 0.0–0.1)
Basophils Relative: 0.4 % (ref 0.0–3.0)
Eosinophils Absolute: 0.1 K/uL (ref 0.0–0.7)
Eosinophils Relative: 1.7 % (ref 0.0–5.0)
HCT: 46.6 % (ref 39.0–52.0)
Hemoglobin: 15.8 g/dL (ref 13.0–17.0)
Lymphocytes Relative: 30.5 % (ref 12.0–46.0)
Lymphs Abs: 2 K/uL (ref 0.7–4.0)
MCHC: 33.9 g/dL (ref 30.0–36.0)
MCV: 89 fl (ref 78.0–100.0)
Monocytes Absolute: 0.5 K/uL (ref 0.1–1.0)
Monocytes Relative: 7.5 % (ref 3.0–12.0)
Neutro Abs: 4 K/uL (ref 1.4–7.7)
Neutrophils Relative %: 59.9 % (ref 43.0–77.0)
Platelets: 206 K/uL (ref 150.0–400.0)
RBC: 5.24 Mil/uL (ref 4.22–5.81)
RDW: 12.8 % (ref 11.5–15.5)
WBC: 6.7 K/uL (ref 4.0–10.5)

## 2024-02-17 LAB — COMPREHENSIVE METABOLIC PANEL WITH GFR
ALT: 17 U/L (ref 0–53)
AST: 17 U/L (ref 0–37)
Albumin: 4.7 g/dL (ref 3.5–5.2)
Alkaline Phosphatase: 58 U/L (ref 39–117)
BUN: 11 mg/dL (ref 6–23)
CO2: 31 meq/L (ref 19–32)
Calcium: 9.6 mg/dL (ref 8.4–10.5)
Chloride: 102 meq/L (ref 96–112)
Creatinine, Ser: 0.75 mg/dL (ref 0.40–1.50)
GFR: 113.65 mL/min (ref >60.00)
Glucose, Bld: 73 mg/dL (ref 70–99)
Potassium: 4.3 meq/L (ref 3.5–5.1)
Sodium: 139 meq/L (ref 135–145)
Total Bilirubin: 0.6 mg/dL (ref 0.2–1.2)
Total Protein: 6.6 g/dL (ref 6.0–8.3)

## 2024-02-17 LAB — VITAMIN D 25 HYDROXY (VIT D DEFICIENCY, FRACTURES): VITD: 21.13 ng/mL — ABNORMAL LOW (ref 30.00–100.00)

## 2024-02-17 LAB — VITAMIN B12: Vitamin B-12: 933 pg/mL — ABNORMAL HIGH (ref 211–911)

## 2024-02-17 LAB — TSH: TSH: 0.94 u[IU]/mL (ref 0.35–5.50)

## 2024-02-17 MED ORDER — PROPRANOLOL HCL ER 80 MG PO CP24: 80.0000 mg | ORAL_CAPSULE | Freq: Every day | ORAL | 3 refills | Status: AC

## 2024-02-17 NOTE — Progress Notes (Signed)
 Leron Glance, NP-C Phone: [phone]  Joel Carr is a 38 y.o. male who presents today for annual exam.   Discussed the use of AI scribe software for clinical note transcription with the patient, who gave verbal consent to proceed.  History of Present Illness   Joel Carr is a 38 year old male who presents for annual exam.   He recently completed a residential treatment program lasting over six months, from April 25th to early November. He feels significantly better, with improved sleep quality, and is seven months clean from substance use, primarily vaping, while abstaining from alcohol and drugs.  He experiences headaches two to three days in a row, described as not severe, with pain localized to a specific spot on his head. He reports that propranolol  helped his headaches and that he uses Imitrex  as needed, which he finds effective. His current medications include lamotrigine , amitriptyline , and daily vitamins.  He has a history of left foot pain following surgery on October 26th, 2024, where cartilage was removed and bones were fused with plate screws. He reports constant pain and swelling in the left foot, persisting since a fracture in 2022. He has undergone physical therapy and was referred to pain management.  He reports ringing in his ears occurring three to four times a week, lasting from a few seconds to a few minutes, sometimes affecting his balance. He takes a multivitamin daily.  He had a dental procedure following a gum infection and trauma to his mouth, resulting in the extraction of his upper teeth. He is awaiting a dental prosthesis in January.  No chest pain, shortness of breath, abdominal pain, constipation, diarrhea, burning with urination, skin changes, rashes, or swelling in his legs. He reports improved mood, anxiety, and depression, and better sleep. He has a good appetite and a mixed diet of home-cooked and take-out meals. He does not engage in regular exercise.       Social History   Tobacco Use  Smoking Status Every Day   Types: Cigars, E-cigarettes  Smokeless Tobacco Never  Tobacco Comments   3 cig daily-04/29/2020    Current Outpatient Medications on File Prior to Visit  Medication Sig Dispense Refill   amitriptyline  (ELAVIL ) 25 MG tablet Take 1 tablet (25 mg total) by mouth at bedtime. 30 tablet 0   lamoTRIgine  (LAMICTAL ) 100 MG tablet Take 1 tablet (100 mg total) by mouth 2 (two) times daily. 60 tablet 0   No current facility-administered medications on file prior to visit.     ROS see history of present illness  Objective  Physical Exam Vitals:   02/17/24 0828  BP: 132/80  Pulse: 81  Temp: 97.9 F (36.6 C)  SpO2: 99%    BP Readings from Last 3 Encounters:  02/17/24 132/80  07/25/23 99/74  07/05/23 132/84   Wt Readings from Last 3 Encounters:  02/17/24 160 lb (72.6 kg)  07/24/23 148 lb (67.1 kg)  07/05/23 148 lb (67.1 kg)    Physical Exam Constitutional:      General: He is not in acute distress.    Appearance: Normal appearance.  HENT:     Head: Normocephalic.     Right Ear: Tympanic membrane normal.     Left Ear: Tympanic membrane normal.     Nose: Nose normal.     Mouth/Throat:     Mouth: Mucous membranes are moist.     Pharynx: Oropharynx is clear.  Eyes:     Conjunctiva/sclera: Conjunctivae normal.  Pupils: Pupils are equal, round, and reactive to light.  Neck:     Thyroid: No thyromegaly.  Cardiovascular:     Rate and Rhythm: Normal rate and regular rhythm.     Heart sounds: Normal heart sounds.  Pulmonary:     Effort: Pulmonary effort is normal.     Breath sounds: Normal breath sounds.  Abdominal:     General: Abdomen is flat. Bowel sounds are normal.     Palpations: Abdomen is soft. There is no mass.     Tenderness: There is no abdominal tenderness.  Musculoskeletal:        General: Normal range of motion.  Lymphadenopathy:     Cervical: No cervical adenopathy.  Skin:    General:  Skin is warm and dry.     Findings: No rash.  Neurological:     General: No focal deficit present.     Mental Status: He is alert.  Psychiatric:        Mood and Affect: Mood normal.        Behavior: Behavior normal.      Assessment/Plan: Please see individual problem list.  Encounter for routine adult medical exam with abnormal findings Assessment & Plan: Physical exam complete. We will check lab work as outlined. Colonoscopy and PSA screening not indicated. Flu vaccine administered today. Tetanus vaccine is up to date. Declines additional COVID vaccines. Continue routine dental exams, advise regular eye exams. Encourage healthy diet and exercise. Return to care in one year, sooner as needed.    Tinnitus of both ears Assessment & Plan: He has frequent tinnitus episodes. Exam WNL. Check lab work as outlined.   Orders: -     CBC with Differential/Platelet -     Vitamin B12  History of substance use Assessment & Plan: His substance use disorder has been in remission for over six months. Congratulated. Encouraged to continue to abstain.    Bipolar affective disorder, currently depressed, mild (HCC) Assessment & Plan: His mood disorder is stable on lamotrigine  and amitriptyline , with improved sleep. Continue current medication regimen. Follow up with Psychiatry as scheduled.   Orders: -     TSH -     VITAMIN D  25 Hydroxy (Vit-D Deficiency, Fractures)  Mixed hyperlipidemia Assessment & Plan: They have a history of high cholesterol and were previously on atorvastatin  40mg  but are currently not on medication. Check lipid panel today. Consider restarting statin.   Orders: -     Comprehensive metabolic panel with GFR -     Lipid panel  Headache disorder Assessment & Plan: He has intermittent headaches previously managed with propranolol , which was more effective than Imitrex . Continue. Refills sent.   Orders: -     Propranolol  HCl ER; Take 1 capsule (80 mg total) by mouth  daily.  Dispense: 90 capsule; Refill: 3 -     Vitamin B12  Need for influenza vaccination -     Flu vaccine trivalent PF, 6mos and older(Flulaval,Afluria,Fluarix,Fluzone)     Return in about 1 year (around 02/16/2025) for Annual Exam, sooner as needed.   Leron Glance, NP-C [facility]

## 2024-02-28 ENCOUNTER — Ambulatory Visit: Payer: Self-pay | Admitting: Nurse Practitioner

## 2024-02-29 ENCOUNTER — Other Ambulatory Visit: Payer: Self-pay | Admitting: Nurse Practitioner

## 2024-02-29 DIAGNOSIS — E782 Mixed hyperlipidemia: Secondary | ICD-10-CM

## 2024-02-29 MED ORDER — ROSUVASTATIN CALCIUM 10 MG PO TABS: 10.0000 mg | ORAL_TABLET | Freq: Every day | ORAL | 3 refills | Status: AC

## 2024-03-06 ENCOUNTER — Encounter: Payer: Self-pay | Admitting: Nurse Practitioner

## 2024-03-06 DIAGNOSIS — Z87898 Personal history of other specified conditions: Secondary | ICD-10-CM | POA: Insufficient documentation

## 2024-03-06 NOTE — Assessment & Plan Note (Signed)
 He has frequent tinnitus episodes. Exam WNL. Check lab work as outlined.

## 2024-03-06 NOTE — Assessment & Plan Note (Signed)
 They have a history of high cholesterol and were previously on atorvastatin  40mg  but are currently not on medication. Check lipid panel today. Consider restarting statin.

## 2024-03-06 NOTE — Assessment & Plan Note (Signed)
 He has intermittent headaches previously managed with propranolol , which was more effective than Imitrex . Continue. Refills sent.

## 2024-03-06 NOTE — Assessment & Plan Note (Signed)
 Physical exam complete. We will check lab work as outlined. Colonoscopy and PSA screening not indicated. Flu vaccine administered today. Tetanus vaccine is up to date. Declines additional COVID vaccines. Continue routine dental exams, advise regular eye exams. Encourage healthy diet and exercise. Return to care in one year, sooner as needed.

## 2024-03-06 NOTE — Assessment & Plan Note (Signed)
 His mood disorder is stable on lamotrigine  and amitriptyline , with improved sleep. Continue current medication regimen. Follow up with Psychiatry as scheduled.

## 2024-03-06 NOTE — Assessment & Plan Note (Signed)
 His substance use disorder has been in remission for over six months. Congratulated. Encouraged to continue to abstain.

## 2024-04-10 ENCOUNTER — Other Ambulatory Visit: Payer: Self-pay

## 2024-04-10 DIAGNOSIS — E782 Mixed hyperlipidemia: Secondary | ICD-10-CM

## 2024-04-10 LAB — LIPID PANEL
Cholesterol: 169 mg/dL (ref 28–200)
HDL: 42.1 mg/dL
LDL Cholesterol: 74 mg/dL (ref 10–99)
NonHDL: 126.65
Total CHOL/HDL Ratio: 4
Triglycerides: 262 mg/dL — ABNORMAL HIGH (ref 10.0–149.0)
VLDL: 52.4 mg/dL — ABNORMAL HIGH (ref 0.0–40.0)

## 2024-04-10 LAB — HEPATIC FUNCTION PANEL
ALT: 16 U/L (ref 3–53)
AST: 16 U/L (ref 5–37)
Albumin: 4.7 g/dL (ref 3.5–5.2)
Alkaline Phosphatase: 56 U/L (ref 39–117)
Bilirubin, Direct: 0.1 mg/dL (ref 0.1–0.3)
Total Bilirubin: 0.4 mg/dL (ref 0.2–1.2)
Total Protein: 6.4 g/dL (ref 6.0–8.3)

## 2024-04-11 ENCOUNTER — Ambulatory Visit: Payer: Self-pay | Admitting: Nurse Practitioner

## 2025-02-19 ENCOUNTER — Encounter: Payer: Self-pay | Admitting: Nurse Practitioner
